# Patient Record
Sex: Female | Born: 1935 | Race: Black or African American | Hispanic: No | Smoking: Never smoker
Health system: Southern US, Community
[De-identification: ages and names within clinical notes are randomized; demographics above are authoritative.]

## PROBLEM LIST (undated history)

## (undated) DIAGNOSIS — F22 Delusional disorders: Secondary | ICD-10-CM

## (undated) DIAGNOSIS — I10 Essential (primary) hypertension: Secondary | ICD-10-CM

## (undated) DIAGNOSIS — K5730 Diverticulosis of large intestine without perforation or abscess without bleeding: Secondary | ICD-10-CM

## (undated) DIAGNOSIS — F29 Unspecified psychosis not due to a substance or known physiological condition: Secondary | ICD-10-CM

## (undated) DIAGNOSIS — K439 Ventral hernia without obstruction or gangrene: Secondary | ICD-10-CM

## (undated) DIAGNOSIS — K219 Gastro-esophageal reflux disease without esophagitis: Secondary | ICD-10-CM

## (undated) DIAGNOSIS — E785 Hyperlipidemia, unspecified: Secondary | ICD-10-CM

## (undated) DIAGNOSIS — F0390 Unspecified dementia without behavioral disturbance: Secondary | ICD-10-CM

## (undated) DIAGNOSIS — K589 Irritable bowel syndrome without diarrhea: Secondary | ICD-10-CM

## (undated) DIAGNOSIS — K449 Diaphragmatic hernia without obstruction or gangrene: Secondary | ICD-10-CM

## (undated) HISTORY — DX: Essential (primary) hypertension: I10

## (undated) HISTORY — DX: Ventral hernia without obstruction or gangrene: K43.9

## (undated) HISTORY — DX: Irritable bowel syndrome, unspecified: K58.9

## (undated) HISTORY — DX: Diverticulosis of large intestine without perforation or abscess without bleeding: K57.30

## (undated) HISTORY — PX: HIATAL HERNIA REPAIR: SHX195

## (undated) HISTORY — DX: Delusional disorders: F22

## (undated) HISTORY — DX: Hyperlipidemia, unspecified: E78.5

## (undated) HISTORY — PX: CHOLECYSTECTOMY: SHX55

## (undated) HISTORY — DX: Unspecified psychosis not due to a substance or known physiological condition: F29

## (undated) HISTORY — DX: Unspecified dementia, unspecified severity, without behavioral disturbance, psychotic disturbance, mood disturbance, and anxiety: F03.90

## (undated) HISTORY — DX: Gastro-esophageal reflux disease without esophagitis: K21.9

## (undated) HISTORY — DX: Diaphragmatic hernia without obstruction or gangrene: K44.9

---

## 1997-10-13 ENCOUNTER — Ambulatory Visit (HOSPITAL_COMMUNITY): Admission: RE | Admit: 1997-10-13 | Discharge: 1997-10-13 | Payer: Self-pay | Admitting: Pulmonary Disease

## 1998-01-17 ENCOUNTER — Ambulatory Visit (HOSPITAL_COMMUNITY): Admission: RE | Admit: 1998-01-17 | Discharge: 1998-01-17 | Payer: Self-pay | Admitting: Pulmonary Disease

## 1998-01-17 ENCOUNTER — Encounter: Payer: Self-pay | Admitting: Pulmonary Disease

## 1998-02-08 ENCOUNTER — Other Ambulatory Visit: Admission: RE | Admit: 1998-02-08 | Discharge: 1998-02-08 | Payer: Self-pay | Admitting: Obstetrics & Gynecology

## 1998-05-25 ENCOUNTER — Inpatient Hospital Stay (HOSPITAL_COMMUNITY): Admission: EM | Admit: 1998-05-25 | Discharge: 1998-05-28 | Payer: Self-pay | Admitting: Emergency Medicine

## 1998-05-25 ENCOUNTER — Encounter: Payer: Self-pay | Admitting: Emergency Medicine

## 1998-11-23 ENCOUNTER — Inpatient Hospital Stay (HOSPITAL_COMMUNITY): Admission: RE | Admit: 1998-11-23 | Discharge: 1998-11-27 | Payer: Self-pay

## 1998-12-07 ENCOUNTER — Ambulatory Visit (HOSPITAL_COMMUNITY): Admission: RE | Admit: 1998-12-07 | Discharge: 1998-12-07 | Payer: Self-pay

## 1998-12-14 ENCOUNTER — Ambulatory Visit (HOSPITAL_COMMUNITY): Admission: RE | Admit: 1998-12-14 | Discharge: 1998-12-14 | Payer: Self-pay

## 1999-04-25 ENCOUNTER — Encounter: Payer: Self-pay | Admitting: Pulmonary Disease

## 1999-04-25 ENCOUNTER — Ambulatory Visit (HOSPITAL_COMMUNITY): Admission: RE | Admit: 1999-04-25 | Discharge: 1999-04-25 | Payer: Self-pay | Admitting: Pulmonary Disease

## 2000-02-13 ENCOUNTER — Other Ambulatory Visit: Admission: RE | Admit: 2000-02-13 | Discharge: 2000-02-13 | Payer: Self-pay | Admitting: Obstetrics and Gynecology

## 2000-04-30 ENCOUNTER — Ambulatory Visit (HOSPITAL_COMMUNITY): Admission: RE | Admit: 2000-04-30 | Discharge: 2000-04-30 | Payer: Self-pay | Admitting: Pulmonary Disease

## 2000-04-30 ENCOUNTER — Encounter: Payer: Self-pay | Admitting: Pulmonary Disease

## 2000-11-12 ENCOUNTER — Other Ambulatory Visit: Admission: RE | Admit: 2000-11-12 | Discharge: 2000-11-12 | Payer: Self-pay | Admitting: Gastroenterology

## 2000-11-12 ENCOUNTER — Encounter (INDEPENDENT_AMBULATORY_CARE_PROVIDER_SITE_OTHER): Payer: Self-pay | Admitting: Specialist

## 2000-11-16 ENCOUNTER — Encounter: Admission: RE | Admit: 2000-11-16 | Discharge: 2000-11-16 | Payer: Self-pay | Admitting: Gastroenterology

## 2000-11-16 ENCOUNTER — Encounter: Payer: Self-pay | Admitting: Gastroenterology

## 2000-11-26 ENCOUNTER — Encounter: Payer: Self-pay | Admitting: Gastroenterology

## 2000-11-26 ENCOUNTER — Encounter: Admission: RE | Admit: 2000-11-26 | Discharge: 2000-11-26 | Payer: Self-pay | Admitting: Gastroenterology

## 2001-02-17 ENCOUNTER — Other Ambulatory Visit: Admission: RE | Admit: 2001-02-17 | Discharge: 2001-02-17 | Payer: Self-pay | Admitting: *Deleted

## 2002-03-09 ENCOUNTER — Ambulatory Visit (HOSPITAL_COMMUNITY): Admission: RE | Admit: 2002-03-09 | Discharge: 2002-03-09 | Payer: Self-pay | Admitting: Pulmonary Disease

## 2002-03-09 ENCOUNTER — Encounter: Payer: Self-pay | Admitting: Cardiovascular Disease

## 2002-07-22 ENCOUNTER — Emergency Department (HOSPITAL_COMMUNITY): Admission: EM | Admit: 2002-07-22 | Discharge: 2002-07-22 | Payer: Self-pay | Admitting: Emergency Medicine

## 2002-07-22 ENCOUNTER — Encounter: Payer: Self-pay | Admitting: Emergency Medicine

## 2003-03-02 ENCOUNTER — Encounter (INDEPENDENT_AMBULATORY_CARE_PROVIDER_SITE_OTHER): Payer: Self-pay | Admitting: Gastroenterology

## 2003-07-31 ENCOUNTER — Other Ambulatory Visit: Admission: RE | Admit: 2003-07-31 | Discharge: 2003-07-31 | Payer: Self-pay | Admitting: *Deleted

## 2003-12-07 ENCOUNTER — Emergency Department (HOSPITAL_COMMUNITY): Admission: EM | Admit: 2003-12-07 | Discharge: 2003-12-07 | Payer: Self-pay | Admitting: Emergency Medicine

## 2004-03-06 ENCOUNTER — Ambulatory Visit (HOSPITAL_COMMUNITY): Admission: RE | Admit: 2004-03-06 | Discharge: 2004-03-06 | Payer: Self-pay | Admitting: Pulmonary Disease

## 2004-04-03 ENCOUNTER — Ambulatory Visit: Payer: Self-pay | Admitting: Gastroenterology

## 2004-05-03 ENCOUNTER — Ambulatory Visit: Payer: Self-pay | Admitting: Gastroenterology

## 2004-06-27 ENCOUNTER — Emergency Department (HOSPITAL_COMMUNITY): Admission: EM | Admit: 2004-06-27 | Discharge: 2004-06-27 | Payer: Self-pay | Admitting: Emergency Medicine

## 2004-07-03 ENCOUNTER — Ambulatory Visit: Payer: Self-pay | Admitting: Gastroenterology

## 2004-07-05 ENCOUNTER — Encounter: Admission: RE | Admit: 2004-07-05 | Discharge: 2004-07-05 | Payer: Self-pay | Admitting: Gastroenterology

## 2004-11-04 ENCOUNTER — Emergency Department (HOSPITAL_COMMUNITY): Admission: EM | Admit: 2004-11-04 | Discharge: 2004-11-04 | Payer: Self-pay | Admitting: Emergency Medicine

## 2004-11-27 ENCOUNTER — Ambulatory Visit: Payer: Self-pay | Admitting: Gastroenterology

## 2004-12-09 ENCOUNTER — Ambulatory Visit: Payer: Self-pay | Admitting: Gastroenterology

## 2005-02-24 ENCOUNTER — Emergency Department (HOSPITAL_COMMUNITY): Admission: EM | Admit: 2005-02-24 | Discharge: 2005-02-24 | Payer: Self-pay | Admitting: Emergency Medicine

## 2005-09-30 ENCOUNTER — Ambulatory Visit (HOSPITAL_COMMUNITY): Admission: RE | Admit: 2005-09-30 | Discharge: 2005-09-30 | Payer: Self-pay | Admitting: Pulmonary Disease

## 2005-10-15 ENCOUNTER — Ambulatory Visit: Payer: Self-pay | Admitting: Gastroenterology

## 2005-11-02 ENCOUNTER — Emergency Department (HOSPITAL_COMMUNITY): Admission: EM | Admit: 2005-11-02 | Discharge: 2005-11-03 | Payer: Self-pay | Admitting: Emergency Medicine

## 2005-11-10 ENCOUNTER — Ambulatory Visit: Payer: Self-pay | Admitting: Gastroenterology

## 2005-12-01 ENCOUNTER — Ambulatory Visit: Payer: Self-pay | Admitting: Cardiology

## 2005-12-15 ENCOUNTER — Emergency Department (HOSPITAL_COMMUNITY): Admission: EM | Admit: 2005-12-15 | Discharge: 2005-12-15 | Payer: Self-pay | Admitting: Family Medicine

## 2005-12-18 ENCOUNTER — Ambulatory Visit: Payer: Self-pay | Admitting: Cardiology

## 2005-12-18 ENCOUNTER — Ambulatory Visit: Payer: Self-pay

## 2006-03-23 ENCOUNTER — Ambulatory Visit: Payer: Self-pay | Admitting: Gastroenterology

## 2006-06-09 ENCOUNTER — Emergency Department (HOSPITAL_COMMUNITY): Admission: EM | Admit: 2006-06-09 | Discharge: 2006-06-09 | Payer: Self-pay | Admitting: Emergency Medicine

## 2006-07-06 ENCOUNTER — Emergency Department (HOSPITAL_COMMUNITY): Admission: EM | Admit: 2006-07-06 | Discharge: 2006-07-06 | Payer: Self-pay | Admitting: Family Medicine

## 2006-07-22 ENCOUNTER — Ambulatory Visit: Payer: Self-pay | Admitting: Gastroenterology

## 2006-08-08 ENCOUNTER — Emergency Department (HOSPITAL_COMMUNITY): Admission: EM | Admit: 2006-08-08 | Discharge: 2006-08-08 | Payer: Self-pay | Admitting: Emergency Medicine

## 2006-12-12 ENCOUNTER — Emergency Department (HOSPITAL_COMMUNITY): Admission: EM | Admit: 2006-12-12 | Discharge: 2006-12-12 | Payer: Self-pay | Admitting: Emergency Medicine

## 2006-12-15 ENCOUNTER — Ambulatory Visit: Payer: Self-pay | Admitting: Gastroenterology

## 2006-12-30 ENCOUNTER — Ambulatory Visit: Payer: Self-pay | Admitting: Gastroenterology

## 2007-01-05 ENCOUNTER — Encounter: Admission: RE | Admit: 2007-01-05 | Discharge: 2007-01-05 | Payer: Self-pay | Admitting: Gastroenterology

## 2007-02-23 ENCOUNTER — Ambulatory Visit: Payer: Self-pay | Admitting: Gastroenterology

## 2007-02-26 ENCOUNTER — Ambulatory Visit: Payer: Self-pay | Admitting: Gastroenterology

## 2007-04-19 ENCOUNTER — Ambulatory Visit: Payer: Self-pay | Admitting: Gastroenterology

## 2007-04-29 ENCOUNTER — Emergency Department (HOSPITAL_COMMUNITY): Admission: EM | Admit: 2007-04-29 | Discharge: 2007-04-29 | Payer: Self-pay | Admitting: Emergency Medicine

## 2007-05-14 ENCOUNTER — Ambulatory Visit: Payer: Self-pay | Admitting: Gastroenterology

## 2007-08-04 DIAGNOSIS — E785 Hyperlipidemia, unspecified: Secondary | ICD-10-CM

## 2007-08-04 DIAGNOSIS — K449 Diaphragmatic hernia without obstruction or gangrene: Secondary | ICD-10-CM | POA: Insufficient documentation

## 2007-08-04 DIAGNOSIS — K219 Gastro-esophageal reflux disease without esophagitis: Secondary | ICD-10-CM | POA: Insufficient documentation

## 2007-08-04 DIAGNOSIS — I10 Essential (primary) hypertension: Secondary | ICD-10-CM

## 2007-08-04 DIAGNOSIS — K439 Ventral hernia without obstruction or gangrene: Secondary | ICD-10-CM | POA: Insufficient documentation

## 2007-08-04 DIAGNOSIS — K589 Irritable bowel syndrome without diarrhea: Secondary | ICD-10-CM

## 2007-08-04 DIAGNOSIS — K5730 Diverticulosis of large intestine without perforation or abscess without bleeding: Secondary | ICD-10-CM | POA: Insufficient documentation

## 2007-08-08 ENCOUNTER — Emergency Department (HOSPITAL_COMMUNITY): Admission: EM | Admit: 2007-08-08 | Discharge: 2007-08-08 | Payer: Self-pay | Admitting: Emergency Medicine

## 2007-11-09 ENCOUNTER — Encounter: Payer: Self-pay | Admitting: Gastroenterology

## 2007-11-10 ENCOUNTER — Ambulatory Visit: Payer: Self-pay | Admitting: Gastroenterology

## 2007-11-10 DIAGNOSIS — Z860100 Personal history of colon polyps, unspecified: Secondary | ICD-10-CM | POA: Insufficient documentation

## 2007-11-10 DIAGNOSIS — K5900 Constipation, unspecified: Secondary | ICD-10-CM

## 2007-11-23 ENCOUNTER — Telehealth: Payer: Self-pay | Admitting: Gastroenterology

## 2007-12-10 ENCOUNTER — Emergency Department (HOSPITAL_COMMUNITY): Admission: EM | Admit: 2007-12-10 | Discharge: 2007-12-10 | Payer: Self-pay | Admitting: Emergency Medicine

## 2007-12-27 ENCOUNTER — Telehealth: Payer: Self-pay | Admitting: Gastroenterology

## 2008-01-05 ENCOUNTER — Ambulatory Visit: Payer: Self-pay | Admitting: Gastroenterology

## 2008-01-11 ENCOUNTER — Ambulatory Visit: Payer: Self-pay | Admitting: Gastroenterology

## 2008-05-10 ENCOUNTER — Telehealth: Payer: Self-pay | Admitting: Gastroenterology

## 2008-05-27 ENCOUNTER — Emergency Department (HOSPITAL_COMMUNITY): Admission: EM | Admit: 2008-05-27 | Discharge: 2008-05-27 | Payer: Self-pay | Admitting: Emergency Medicine

## 2008-05-29 ENCOUNTER — Telehealth: Payer: Self-pay | Admitting: Gastroenterology

## 2008-05-31 ENCOUNTER — Ambulatory Visit: Payer: Self-pay | Admitting: Gastroenterology

## 2008-05-31 DIAGNOSIS — R1013 Epigastric pain: Secondary | ICD-10-CM | POA: Insufficient documentation

## 2008-05-31 LAB — CONVERTED CEMR LAB
BUN: 20 mg/dL (ref 6–23)
Creatinine, Ser: 1 mg/dL (ref 0.4–1.2)

## 2008-06-01 ENCOUNTER — Telehealth: Payer: Self-pay | Admitting: Gastroenterology

## 2008-06-05 ENCOUNTER — Ambulatory Visit: Payer: Self-pay | Admitting: Cardiology

## 2008-06-07 ENCOUNTER — Telehealth: Payer: Self-pay | Admitting: Gastroenterology

## 2008-06-12 ENCOUNTER — Telehealth: Payer: Self-pay | Admitting: Gastroenterology

## 2008-06-14 ENCOUNTER — Telehealth: Payer: Self-pay | Admitting: Gastroenterology

## 2008-06-15 ENCOUNTER — Telehealth: Payer: Self-pay | Admitting: Gastroenterology

## 2008-06-21 ENCOUNTER — Emergency Department (HOSPITAL_COMMUNITY): Admission: EM | Admit: 2008-06-21 | Discharge: 2008-06-21 | Payer: Self-pay | Admitting: Emergency Medicine

## 2008-10-24 ENCOUNTER — Emergency Department (HOSPITAL_COMMUNITY): Admission: EM | Admit: 2008-10-24 | Discharge: 2008-10-24 | Payer: Self-pay | Admitting: Family Medicine

## 2008-10-25 ENCOUNTER — Telehealth: Payer: Self-pay | Admitting: Gastroenterology

## 2008-11-08 ENCOUNTER — Ambulatory Visit: Admission: RE | Admit: 2008-11-08 | Discharge: 2008-11-08 | Payer: Self-pay | Admitting: Obstetrics and Gynecology

## 2008-11-08 ENCOUNTER — Ambulatory Visit: Payer: Self-pay | Admitting: Vascular Surgery

## 2008-11-08 ENCOUNTER — Encounter (INDEPENDENT_AMBULATORY_CARE_PROVIDER_SITE_OTHER): Payer: Self-pay | Admitting: Obstetrics and Gynecology

## 2008-12-06 ENCOUNTER — Telehealth: Payer: Self-pay | Admitting: Gastroenterology

## 2009-04-10 ENCOUNTER — Telehealth: Payer: Self-pay | Admitting: Gastroenterology

## 2009-04-11 ENCOUNTER — Emergency Department (HOSPITAL_COMMUNITY): Admission: EM | Admit: 2009-04-11 | Discharge: 2009-04-11 | Payer: Self-pay | Admitting: Emergency Medicine

## 2009-05-11 ENCOUNTER — Ambulatory Visit: Payer: Self-pay | Admitting: Gastroenterology

## 2009-05-13 ENCOUNTER — Telehealth: Payer: Self-pay | Admitting: Internal Medicine

## 2009-05-14 ENCOUNTER — Telehealth: Payer: Self-pay | Admitting: Gastroenterology

## 2009-05-16 ENCOUNTER — Telehealth: Payer: Self-pay | Admitting: Gastroenterology

## 2009-05-17 ENCOUNTER — Telehealth: Payer: Self-pay | Admitting: Gastroenterology

## 2009-05-22 ENCOUNTER — Telehealth: Payer: Self-pay | Admitting: Gastroenterology

## 2009-05-28 ENCOUNTER — Emergency Department (HOSPITAL_COMMUNITY): Admission: EM | Admit: 2009-05-28 | Discharge: 2009-05-28 | Payer: Self-pay | Admitting: Emergency Medicine

## 2009-05-28 ENCOUNTER — Telehealth: Payer: Self-pay | Admitting: Gastroenterology

## 2009-05-30 ENCOUNTER — Telehealth (INDEPENDENT_AMBULATORY_CARE_PROVIDER_SITE_OTHER): Payer: Self-pay | Admitting: *Deleted

## 2009-06-01 ENCOUNTER — Ambulatory Visit: Payer: Self-pay | Admitting: Gastroenterology

## 2009-06-03 ENCOUNTER — Telehealth: Payer: Self-pay | Admitting: Gastroenterology

## 2009-06-04 ENCOUNTER — Ambulatory Visit: Payer: Self-pay | Admitting: Gastroenterology

## 2009-06-04 ENCOUNTER — Encounter (INDEPENDENT_AMBULATORY_CARE_PROVIDER_SITE_OTHER): Payer: Self-pay | Admitting: *Deleted

## 2009-06-05 ENCOUNTER — Telehealth (INDEPENDENT_AMBULATORY_CARE_PROVIDER_SITE_OTHER): Payer: Self-pay | Admitting: *Deleted

## 2009-06-06 ENCOUNTER — Encounter: Payer: Self-pay | Admitting: Gastroenterology

## 2009-06-11 ENCOUNTER — Telehealth: Payer: Self-pay | Admitting: Gastroenterology

## 2009-06-13 ENCOUNTER — Telehealth: Payer: Self-pay | Admitting: Gastroenterology

## 2009-07-12 ENCOUNTER — Telehealth: Payer: Self-pay | Admitting: Gastroenterology

## 2009-07-16 ENCOUNTER — Ambulatory Visit: Payer: Self-pay | Admitting: Gastroenterology

## 2009-07-18 LAB — CONVERTED CEMR LAB
Basophils Absolute: 0.1 10*3/uL (ref 0.0–0.1)
Basophils Relative: 1.5 % (ref 0.0–3.0)
Eosinophils Relative: 3.5 % (ref 0.0–5.0)
HCT: 35.7 % — ABNORMAL LOW (ref 36.0–46.0)
Hemoglobin: 12 g/dL (ref 12.0–15.0)
Lymphocytes Relative: 34.9 % (ref 12.0–46.0)
Platelets: 224 10*3/uL (ref 150.0–400.0)
RBC: 4 M/uL (ref 3.87–5.11)
RDW: 13.5 % (ref 11.5–14.6)

## 2009-07-24 ENCOUNTER — Emergency Department (HOSPITAL_COMMUNITY): Admission: EM | Admit: 2009-07-24 | Discharge: 2009-07-25 | Payer: Self-pay | Admitting: Emergency Medicine

## 2010-01-14 ENCOUNTER — Telehealth: Payer: Self-pay | Admitting: Gastroenterology

## 2010-01-15 ENCOUNTER — Telehealth: Payer: Self-pay | Admitting: Gastroenterology

## 2010-01-23 ENCOUNTER — Telehealth: Payer: Self-pay | Admitting: Gastroenterology

## 2010-02-05 ENCOUNTER — Encounter (INDEPENDENT_AMBULATORY_CARE_PROVIDER_SITE_OTHER): Payer: Self-pay | Admitting: Internal Medicine

## 2010-02-05 ENCOUNTER — Inpatient Hospital Stay (HOSPITAL_COMMUNITY): Admission: EM | Admit: 2010-02-05 | Discharge: 2010-02-07 | Payer: Self-pay | Admitting: Emergency Medicine

## 2010-05-25 ENCOUNTER — Encounter: Payer: Self-pay | Admitting: Obstetrics and Gynecology

## 2010-05-27 ENCOUNTER — Encounter: Payer: Self-pay | Admitting: Gastroenterology

## 2010-06-04 NOTE — Progress Notes (Signed)
Summary: Triage   Phone Note Call from Patient Call back at Home Phone [phone]   Caller: Patient Call For: Dr. Christella Hartigan Reason for Call: Talk to Nurse Summary of Call: Pt. has no energy and wants to know if she can take Ensure/Boost Initial call taken by: Karna Christmas,  June 11, 2009 1:20 PM  Follow-up for Phone Call        pt was instructed to call her PCP about her fatigue.   Follow-up by: Chales Abrahams CMA (AAMA),  June 11, 2009 1:24 PM

## 2010-06-04 NOTE — Progress Notes (Signed)
Summary: discussed pt with Dr. Petra Kuba   Phone Note From Other Clinic   Caller: DR. Petra Kuba Summary of Call: Ann Riddle has been calling him several times a week as well with abdominal pains (well described, well testes, probably functional).  we agreed that she would stop PPI (perhaps are causing dyspepsia) and she will change to H2 blocker twice daily for at least a week.  if no improvement, then trial of low dose reglan 5mg  in AM and at bedtime. Initial call taken by: Rachael Fee MD,  May 17, 2009 11:55 AM     Appended Document: discussed pt with Dr. Petra Kuba pt called and wanted to know what Dr Christella Hartigan and Dr Petra Kuba decided on about her abd pain.  I gave her the recommendations described in the phone note above.  She is going to call in about a week to give [place] a condition update.

## 2010-06-04 NOTE — Assessment & Plan Note (Signed)
Review of gastrointestinal problems: 1. Chronic abdominal pain. Diagnosed as adhesive disease in the past by Dr. Victorino Dike. CT scan September 2008 showed ventral hiatal hernia was stable.  No acute findings in the abdomen or pelvis.  January, 2010: repeat pains, went to emergency room, complete metabolic profile, amylase, lipase were normal. CT Scan January, 2010 with IV and oral contrast of abdomen and pelvis was normal.  CT Scan December, 2010 done by ER, essentially normal. 2. Chronic gastroesophageal reflux disease. Esophagogastroduodenoscopy July 2002 showed no Barrett's esophagus. EGD October 2008 was normal, except for a 2 cm hiatal hernia. 3. History of colon polyps, last colonoscopy October 2004 by Dr. Victorino Dike showed no colon polyps. He wrote the indication for that examination was adenomatous polyps. I do not see any pathology reports in our chart indicating adenomatous polyps. Repeat colonoscopy 01/2008 by [facility] was normal to terminal ileum except for small hemorrhoids. 4. Status post remote cholecystectomy     History of Present Illness Visit Type: Follow-up Visit Primary GI MD: Rob Bunting MD Primary Provider: Myrtie Neither Requesting Provider: Ainsley Spinner Chief Complaint: Abdomial pain & nausea History of Present Illness:     77 year old woman who has chronic abdominal pains, CD extensive workup for the past several years above. She has canceled or no showed her last 4 appointments in the past year with me.    she has had 6 months of abd pains, "throbbing" like pains,  usually worst when she is laying down.  Usually after eating a meal makes it worse.  Will wake up with the discomforts in middle of the night.  +Nuasea, but no vomitting.    She eats dinner at 5, lays down for bed at 11:30.  Will eat snack about 1/2 the time.    Takes nexium 8am, eats BF at 8:30.  Also takes nexium before dinner.  Sometimes will miss the nexium at night.  she was in the hospital in  December for the same pains. I see that he CT scan of her abdomen and pelvis was repeated and this showed no obvious cause for her pains. She was recently her primary care's office who recommended and give her a prescription for metoclopramide 10 mg to take several times a day.  Does not drink alcohol, smoke cigs.  Rarely caffein in PM>           Current Medications (verified): 1)  Benicar Hct 40-25 Mg  Tabs (Olmesartan Medoxomil-Hctz) .Marland Kitchen.. 12.5 M-F 40 Mg Daily 2)  Welchol 625 Mg  Tabs (Colesevelam Hcl) .Marland Kitchen.. 1 At Bedtime 3)  Klor-Con 20 Meq  Pack (Potassium Chloride) .... Take 1 Tablet Twice Weekly 4)  Adult Aspirin Low Strength 81 Mg  Tbdp (Aspirin) .Marland Kitchen.. 1 Once Daily 5)  Centrum Silver   Tabs (Multiple Vitamins-Minerals) .Marland Kitchen.. 1 Once Daily 6)  Nexium 40 Mg  Cpdr (Esomeprazole Magnesium) .... One Tablet Twice Daily 7)  Alprazolam 0.25 Mg  Tbdp (Alprazolam) .... As Needed  Allergies (verified): 1)  * Robinol Forte 2)  * Align  Vital Signs:  Patient profile:   77 year old female Height:      60.5 inches Weight:      174.13 pounds BMI:     33.57 Pulse rate:   76 / minute Pulse rhythm:   regular BP sitting:   140 / 66  (left arm) Cuff size:   regular  Vitals Entered By: June McMurray CMA [facility]) (May 11, 2009 9:51 AM)  Physical Exam  Additional Exam:  Constitutional: generally well appearing Psychiatric: alert and oriented times 3 Abdomen: soft, non-tender, non-distended, normal bowel sounds    Impression & Recommendations:  Problem # 1:  chronic abdominal pains she has had chronic abdominal pains for many years. Extensive workup, summarized above including upper endoscopy, colonoscopy, multiple CT scans ( I count 6 CT scans of her abdomen and pelvis done in the past 8-9 years).  I do not think further workup needs to be done in the symptoms. She has either functional dyspepsia or perhaps GERD related dyspepsia. Today she describes discomforts that are usually in the  middle the night, perhaps pointing more towards acid reflux as a etiology. She should continue her Nexium twice daily and I have added Pepcid which she will take at bedtime every night. She was recently given a prescription for metoclopramide which I told her I do not think she should begin.  Patient Instructions: 1)  Continue taking nexium twice daily, 20-30 prior to breafkast and dinner meals. 2)  Add pepcid, take one pill at bedtime every night. 3)  Return to see Dr. Christella Hartigan in 2 months. 4)  Do not start metaclopramide. 5)  The medication list was reviewed and reconciled.  All changed / newly prescribed medications were explained.  A complete medication list was provided to the patient / caregiver.

## 2010-06-04 NOTE — Progress Notes (Signed)
Summary: meds not helping   Phone Note Call from Patient Call back at Home Phone [phone]   Caller: Patient Call For: Dr. Christella Hartigan Reason for Call: Talk to Nurse Summary of Call: pt reports Pepcid inst helping and would like to know if she needs to take something else... pt says she is "really sick" and will have to go to the hospital if someone cant help her Initial call taken by: Vallarie Mare,  May 28, 2009 8:42 AM  Follow-up for Phone Call        I called the pt she is insisting she needs to go to  the ER.  I offered her an appt this afternoon with Dr Christella Hartigan but she refused.  She does have an appt this Friday with Dr Christella Hartigan and will keep that. Follow-up by: Chales Abrahams CMA [facility]),  May 28, 2009 8:56 AM  Additional Follow-up for Phone Call Additional follow up Details #1::        ok Additional Follow-up by: Rachael Fee MD,  May 28, 2009 9:00 AM

## 2010-06-04 NOTE — Progress Notes (Signed)
Summary: Procedure Results   Phone Note Call from Patient Call back at Home Phone [phone]   Caller: Patient Call For: Dr. Christella Hartigan Reason for Call: Talk to Nurse Summary of Call: Pt. is calling about EGD results from yesterday Initial call taken by: Karna Christmas,  June 05, 2009 2:43 PM  Follow-up for Phone Call        RETURNED PHONE CALL AND ANSWERED QUESTION CONCERNING PROCEDURE YESTERDAY. REMINDED PATIENT THAT WE HAVE TO WAIT FOR THE RESULTS TO COME BACK. Follow-up by: Weston Brass,  June 05, 2009 4:21 PM

## 2010-06-04 NOTE — Progress Notes (Signed)
Summary: Abd pain at night   Phone Note Call from Patient Call back at Home Phone [phone]   Call For: DR JACOBS Reason for Call: Talk to Nurse Summary of Call: Abd pain apin at nights- what can she take to soothe it down? Initial call taken by: Leanor Kail [facility],  May 16, 2009 4:42 PM  Follow-up for Phone Call        pt wants to try pepto bismol for her abd pains at night.  she feels like she needs a coating on her stomach.   she had tried the GI cocktail at the ER that helps.  Is there anything better she can take that is a liquid. Follow-up by: Chales Abrahams CMA [facility]),  May 16, 2009 4:51 PM  Additional Follow-up for Phone Call Additional follow up Details #1::        pepto seems reasonable.  she should call us in 2-3 weeks to let us know if it helps Additional Follow-up by: Rachael Fee MD,  May 17, 2009 7:23 AM    Additional Follow-up for Phone Call Additional follow up Details #2::    pt aware and will call in 2-3 weeks Follow-up by: Chales Abrahams CMA [facility]),  May 17, 2009 8:01 AM

## 2010-06-04 NOTE — Letter (Signed)
Summary: Results Letter  [facility]  [address] [place], [place] [address]   Phone: [phone]  Fax: [phone]        June 06, 2009 MRN: [id number]    [facility] [address] [place], [place]  [address]    Dear Ms. Riesen,   The biopsies taken during your recent upper endoscopy showed no sign of infection or cancer.  You should continue to follow the recommnedations that we discussed at the time of your procedure.  Please feel free to call if you have any further questions or concerns.       Sincerely,  Rachael Fee MD  This letter has been electronically signed by your physician.  Appended Document: Results Letter Letter mailed 2.3.11

## 2010-06-04 NOTE — Progress Notes (Signed)
   Phone Note Call from Patient   Caller: Patient Summary of Call: On call note @ 1800. Pt c/o nausea and mild contipation, both chronic and unchanged. Saw Dr. Christella Hartigan in the office on 06/01/09. Wants to know if she can use Maalox and Miralax. Reviewing last several phone notes and office notes, these have already been recommended. Advised to use both as previously directed. Concerning for memory deficits. Initial call taken by: Meryl Dare MD FACG,  June 03, 2009 6:27 PM

## 2010-06-04 NOTE — Progress Notes (Signed)
Summary: nausea   Phone Note Call from Patient Call back at Home Phone [phone]   Call For: DR Anaelle Dunton Reason for Call: Talk to Nurse Summary of Call: Nausea stays on her stomach all the time. Initial call taken by: Leanor Kail [facility],  May 22, 2009 12:16 PM  Follow-up for Phone Call        pt is calling to say that she is nauseaous all the time.  She feels the Pepcid is making her constipated.  She is going to try Miralax 1 capful a day.  She will call if she has no results.  Can she have something for the nausea? Follow-up by: Chales Abrahams CMA [facility]),  May 22, 2009 1:19 PM  Additional Follow-up for Phone Call Additional follow up Details #1::        needs rov next week Additional Follow-up by: Rachael Fee MD,  May 23, 2009 9:14 PM    Additional Follow-up for Phone Call Additional follow up Details #2::    spoke with the pt she was asleep and will call later.   06/01/09 1015 am  appt scheduled Follow-up by: Chales Abrahams CMA [facility]),  May 24, 2009 8:37 AM  Additional Follow-up for Phone Call Additional follow up Details #3:: Details for Additional Follow-up Action Taken: pt aware and will keep appt Additional Follow-up by: Chales Abrahams CMA [facility]),  May 24, 2009 11:12 AM

## 2010-06-04 NOTE — Procedures (Signed)
Summary: Upper Endoscopy  Patient: Ann Riddle Note: All result statuses are Final unless otherwise noted.  Tests: (1) Upper Endoscopy (EGD)   EGD Upper Endoscopy       DONE     [facility]     [address]     [place], [place]  [address]           ENDOSCOPY PROCEDURE REPORT           PATIENT:  Ann Riddle, Ann Riddle  MR#:  [id number]     BIRTHDATE:  11/04/1924, 84 yrs. old  GENDER:  female           ENDOSCOPIST:  Rachael Fee, MD           PROCEDURE DATE:  06/04/2009     PROCEDURE:  EGD with biopsy     ASA CLASS:  Class II     INDICATIONS:  chronic nausea, abd pains           MEDICATIONS:   Fentanyl 25 mcg IV, Versed 5 mg IV     TOPICAL ANESTHETIC:  none           DESCRIPTION OF PROCEDURE:   After the risks benefits and     alternatives of the procedure were thoroughly explained, informed     consent was obtained.  The LB GIF-H180 [id number] endoscope was     introduced through the mouth and advanced to the second portion of     the duodenum, without limitations.  The instrument was slowly     withdrawn as the mucosa was fully examined.     <<PROCEDUREIMAGES>>           There was mild, non-specific gastritis (small erosions) in distal     stomach. Biopsies were taken to check for H. pylori (see image1).     Duodenitis was found. This was mild, non-specific (see image2).     Otherwise the examination was normal (see image4 and image3).     Retroflexed views revealed no abnormalities.    The scope was then     withdrawn from the patient and the procedure completed.           COMPLICATIONS:  None           ENDOSCOPIC IMPRESSION:     1) Mild gastritis, non-specific.  Biopsied to check for H.     pylori.     2) Duodenitis, mild     3) Otherwise normal examination           RECOMMENDATIONS:     If biopsies show H. pylori, will start on appropriate     antibiotics.  If not, then she should simply continue current     medicines.     Memory difficulty may be playing a  role in difficulty with     management.           ______________________________     Rachael Fee, MD           cc: Dr. Petra Kuba.           n.     eSIGNED:   Rachael Fee at 06/04/2009 09:05 AM           Ann Riddle, [id number]  Note: An exclamation mark (!) indicates a result that was not dispersed into the flowsheet. Document Creation Date: 06/04/2009 9:06 AM _______________________________________________________________________  (1) Order result status: Final Collection or observation date-time: 06/04/2009 08:44 Requested date-time:  Receipt date-time:  Reported date-time:  Referring Physician:   Ordering Physician: Rob Bunting [phone]) Specimen Source:  Source: Launa Grill Order Number: [id number] Lab site:

## 2010-06-04 NOTE — Progress Notes (Signed)
Summary: Triage   Phone Note Call from Patient Call back at Home Phone [phone]   Caller: Patient Call For: Dr. Christella Hartigan Reason for Call: Talk to Nurse Summary of Call: Pt is having abdominal pain and her stool is dark. "She has ate some strawberries that could be causing it." Initial call taken by: Karna Christmas,  July 12, 2009 12:36 PM  Follow-up for Phone Call         Pt. said she ate strawberries yesterday and got her same abd. pain then she ate some more this am and pain returned. Advised not to eat  any more and use her Mylanta as needed as ordered. Says her stools have been black for over 1 month but denies using iron or Pepto -bismol. Follow-up by: Teryl Lucy RN,  July 12, 2009 1:42 PM  Additional Follow-up for Phone Call Additional follow up Details #1::        should get a cbc friday or monday Additional Follow-up by: Rachael Fee MD,  July 12, 2009 9:12 PM    Additional Follow-up for Phone Call Additional follow up Details #2::      Pt. contacted and she will come for blood work today or Monday. Follow-up by: Teryl Lucy RN,  July 13, 2009 8:58 AM

## 2010-06-04 NOTE — Progress Notes (Signed)
Summary: update and ? about meds   Phone Note Call from Patient Call back at Home Phone [phone]   Caller: Patient Summary of Call: patient called over the weekend and Dr. Leone Payor advised her to take Mylanta but she states that they have pulled it from the shelf so she got maalox, but she has not taken any of it. She states that she feels out of it. That her legs feel numb and she does not have any energy. She states that her stomach is not hurting like it was but it is still bothering her some.  Initial call taken by: Harlow Mares CMA [facility]),  May 14, 2009 2:24 PM  Follow-up for Phone Call        pt advised to follow Dr Teresita Madura advice and call her PCP or go to the ER for the numbness in her legs.  pt agreed. Follow-up by: Chales Abrahams CMA [facility]),  May 14, 2009 2:32 PM  Additional Follow-up for Phone Call Additional follow up Details #1::        ok Additional Follow-up by: Rachael Fee MD,  May 15, 2009 9:17 AM

## 2010-06-04 NOTE — Progress Notes (Signed)
Summary: Appt   Phone Note Call from Patient   Summary of Call: pt calling to cx her appt on Friday because she does not have her copay.  I spoke with April at the front desk and she said the pt can use her one time billing option and be billed on Friday.  Pt agreed and will keep appt on Friday.   Initial call taken by: Chales Abrahams CMA [facility]),  May 30, 2009 10:52 AM

## 2010-06-04 NOTE — Letter (Signed)
Summary: EGD Instructions  [name]  [address] [place], [place] [address]   Phone: [phone]  Fax: [phone]       Ann Riddle    11/04/1924    MRN: [id number]       Procedure Day /Date:06/04/09     Arrival Time: 730 am     Procedure Time:830 am     Location of Procedure:                    [facility] (4th Floor)    PREPARATION FOR ENDOSCOPY   On1/31/11 THE DAY OF THE PROCEDURE:  1.   No solid foods, milk or milk products are allowed after midnight the night before your procedure.  2.   Do not drink anything colored red or purple.  Avoid juices with pulp.  No orange juice.  3.  You may drink clear liquids until630 am, which is 2 hours before your procedure.                                                                                                CLEAR LIQUIDS INCLUDE: Water Jello Ice Popsicles Tea (sugar ok, no milk/cream) Powdered fruit flavored drinks Coffee (sugar ok, no milk/cream) Gatorade Juice: apple, white grape, white cranberry  Lemonade Clear bullion, consomm, broth Carbonated beverages (any kind) Strained chicken noodle soup Hard Candy   MEDICATION INSTRUCTIONS  Unless otherwise instructed, you should take regular prescription medications with a small sip of water as early as possible the morning of your procedure.             OTHER INSTRUCTIONS  You will need a responsible adult at least 75 years of age to accompany you and drive you home.   This person must remain in the waiting room during your procedure.  Wear loose fitting clothing that is easily removed.  Leave jewelry and other valuables at home.  However, you may wish to bring a book to read or an iPod/MP3 player to listen to music as you wait for your procedure to start.  Remove all body piercing jewelry and leave at home.  Total time from sign-in until discharge is approximately 2-3 hours.  You should go home directly after your  procedure and rest.  You can resume normal activities the day after your procedure.  The day of your procedure you should not:   Drive   Make legal decisions   Operate machinery   Drink alcohol   Return to work  You will receive specific instructions about eating, activities and medications before you leave.    The above instructions have been reviewed and explained to me by   _______________________    I fully understand and can verbalize these instructions _____________________________ Date _________

## 2010-06-04 NOTE — Progress Notes (Signed)
Summary: triage   Phone Note Call from Patient Call back at Home Phone [phone]   Caller: Patient Call For: Christella Hartigan Reason for Call: Talk to Nurse Summary of Call: Patient had severe stomach pain all night wants to know what to do states that she took her meds before bed as she was told Initial call taken by: Tawni Levy,  June 13, 2009 3:02 PM  Follow-up for Phone Call        pt says the pain is gone now but she usually gets the pain after she has went to bed.  Not sure if what she eats is causing the pain.  She is going to keep a food journal and see if anything she is eating is causing her symptoms.  She will call in a week with an update. Follow-up by: Chales Abrahams CMA [facility]),  June 13, 2009 3:30 PM  Additional Follow-up for Phone Call Additional follow up Details #1::        ok Additional Follow-up by: Rachael Fee MD,  June 14, 2009 9:38 AM

## 2010-06-04 NOTE — Progress Notes (Signed)
Summary: perpcid not helping  Medications Added MYLANTA 200-200-20 MG/5ML SUSP (ALUM & MAG HYDROXIDE-SIMETH) 2 tablespoons at bedtime and prn       Phone Note Call from Patient   Caller: Patient Summary of Call: The pepcid she is taking at night as recommended is not helping her abdominal pain and reflux symptoms. she says problems are same as described in 05/11/09 note. would like something to "coat her stomach". I have recommended Mylanta 30 cc at bedtime and q 6 hrs as needed. I also explained that pains in her arms and legs are highly unlikely to be linked to GI problems. I reiterated that she should not use metaclopramide. She is to f/u as needed and discuss extremity issues with pcp  Initial call taken by: Iva Boop MD, Clementeen Graham,  May 13, 2009 10:11 AM    New/Updated Medications: MYLANTA 200-200-20 MG/5ML SUSP (ALUM & MAG HYDROXIDE-SIMETH) 2 tablespoons at bedtime and prn

## 2010-06-04 NOTE — Assessment & Plan Note (Signed)
Review of gastrointestinal problems: 1. Chronic abdominal pain. Diagnosed as adhesive disease in the past by Dr. Victorino Dike. CT scan September 2008 showed ventral hiatal hernia was stable.  No acute findings in the abdomen or pelvis.  January, 2010: repeat pains, went to emergency room, complete metabolic profile, amylase, lipase were normal. CT Scan January, 2010 with IV and oral contrast of abdomen and pelvis was normal.  CT Scan December, 2010 done by ER, essentially normal. 2. Chronic gastroesophageal reflux disease. Esophagogastroduodenoscopy July 2002 showed no Barrett's esophagus. EGD October 2008 was normal, except for a 2 cm hiatal hernia. 3. History of colon polyps, last colonoscopy October 2004 by Dr. Victorino Dike showed no colon polyps. He wrote the indication for that examination was adenomatous polyps. I do not see any pathology reports in our chart indicating adenomatous polyps. Repeat colonoscopy 01/2008 by [facility] was normal to terminal ileum except for small hemorrhoids. 4. Status post remote cholecystectomy 5. Nausea:  Winter, 2010    History of Present Illness Visit Type: follow up  Primary GI MD: Rob Bunting MD Primary Provider: Corine Shelter, MD  Requesting Provider: n/a Chief Complaint: Generalized abd pain and nausea  History of Present Illness:     77 year old woman who is here with her daughter today, she is very bothered by nausea. this occurs every day. Eating will usually help the nausea.  She has chronic abd pains, same.  She takes no OTC pain meds (no NSAIDs).  she is only taking pepcid at bedtime for GI meds.  This helps with her abd discomforts.  She believes the pepcid is making her constipated.  she tried doubling her MiraLax on the advice of a church friend, this helped.           Current Medications (verified): 1)  Benicar Hct 40-25 Mg  Tabs (Olmesartan Medoxomil-Hctz) .Marland Kitchen.. 12.5 M-F 40 Mg Daily 2)  Welchol 625 Mg  Tabs (Colesevelam Hcl) .Marland Kitchen.. 1 At  Bedtime 3)  Klor-Con 20 Meq  Pack (Potassium Chloride) .... Take 1 Tablet Twice Weekly 4)  Adult Aspirin Low Strength 81 Mg  Tbdp (Aspirin) .Marland Kitchen.. 1 Once Daily 5)  Centrum Silver   Tabs (Multiple Vitamins-Minerals) .Marland Kitchen.. 1 Once Daily 6)  Pepcid Ac Maximum Strength 20 Mg Chew (Famotidine) .... One Tablet By Mouth Once Daily At Bedtime 7)  Alprazolam 0.25 Mg  Tbdp (Alprazolam) .... As Needed 8)  Mylanta 200-200-20 Mg/20ml Susp (Alum & Mag Hydroxide-Simeth) .... 2 Tablespoons At Bedtime and Prn  Allergies (verified): 1)  * Robinol Forte 2)  * Align  Vital Signs:  Patient profile:   77 year old female Height:      60.5 inches Weight:      166 pounds BMI:     32.00 BSA:     1.74 Pulse rate:   68 / minute Pulse rhythm:   regular BP sitting:   132 / 74  (left arm) Cuff size:   regular  Vitals Entered By: Ok Anis CMA (June 01, 2009 10:03 AM)  Physical Exam  Additional Exam:  Constitutional: generally well appearing Psychiatric: alert and oriented times 3 Abdomen: soft, non-tender, non-distended, normal bowel sounds    Impression & Recommendations:  Problem # 1:  nausea, chronic abdominal pains I think her symptoms are mainly functional. She has been in and out of the emergency room multiple times in the past few months. She has been calling my office in her primary care office as well. She's not seem very clear about what  medicines she is on or why she should be taking them. I recommended she double up on her MiraLax to 2 capsules a day and take this every day for constipation. I recommended she not take the antispasmodics medicines that were given to her by the emergency room as this can contribute to constipation. I have given her a prescription for Zofran, 4 mg pills and have advised her to take one pill every morning shortly after she wakes up. I will arrange for EGD to be done at her soonest convenience.  Patient Instructions: 1)  You will be scheduled to have an upper  endoscopy. 2)  Increase the miralax to two capfuls a day, every day. 3)  Will prescribe zofran 4mg  pills, take one pill every morning.  New prescription. 4)  A copy of this information will be sent to Dr. Petra Kuba. 5)  The medication list was reviewed and reconciled.  All changed / newly prescribed medications were explained.  A complete medication list was provided to the patient / caregiver. Prescriptions: ZOFRAN 4 MG  TABS (ONDANSETRON HCL) take one pill every morning  #30 x 11   Entered and Authorized by:   Rachael Fee MD   Signed by:   Rachael Fee MD on 06/01/2009   Method used:   [facility]        [facility] [phone]* (retail)       [address]       [place], [place]  [address]       Ph: [phone] or [phone]       Fax: [phone]   RxID:   [id number]   Appended Document: Orders Update/EGD    Clinical Lists Changes  Orders: Added new Test order of EGD (EGD) - Signed

## 2010-06-04 NOTE — Progress Notes (Signed)
Summary: Triage   Phone Note Call from Patient Call back at Home Phone [phone]   Caller: Patient Call For: Dr. Elberta Spaniel Reason for Call: Talk to Nurse Summary of Call: pt. said she is "sick as can be" and nothing is helping. Initial call taken by: Karna Christmas,  January 15, 2010 2:13 PM  Follow-up for Phone Call        Says she has epigastric pain as soon as she eats and at night and awakens with nausea .Pt. re-started Pepcid last night but it has not helped.Has Zofran but has not been using it.Instructed to use it q.a.m. as ordered.Told to avoid fried,greasy. spicy foods. Follow-up by: Teryl Lucy RN,  January 15, 2010 2:31 PM  Additional Follow-up for Phone Call Additional follow up Details #1::        ok.  I have thought that her memory was becoming a problem for her in past.  She is not taking her meds (pepcid, now zofran) like they were previously recommended.  I agree with the above rec's Additional Follow-up by: Rachael Fee MD,  January 15, 2010 2:39 PM

## 2010-06-04 NOTE — Progress Notes (Signed)
Summary: Triage  Medications Added NEXIUM 40 MG CPDR (ESOMEPRAZOLE MAGNESIUM) take 1 one half hour before breakfast       Phone Note Call from Patient Call back at Home Phone [phone]   Caller: Patient Call For: Dr. Christella Hartigan Reason for Call: Talk to Nurse Summary of Call: wants to know how long it will take for the Pepcid to take affect Initial call taken by: Karna Christmas,  January 23, 2010 4:48 PM  Follow-up for Phone Call        Says she still has epigastric pain especially at night.Says pepcid isn't helping. Asking if she should re-start Nexium. Follow-up by: Teryl Lucy RN,  January 24, 2010 8:20 AM  Additional Follow-up for Phone Call Additional follow up Details #1::        yes, one pill  every morning 20-30 min prior to BF meal.  rov in 6-7 weeks. Additional Follow-up by: Rachael Fee MD,  January 24, 2010 8:43 AM    Additional Follow-up for Phone Call Additional follow up Details #2::    Pt. ntfd. Follow-up by: Teryl Lucy RN,  January 24, 2010 11:21 AM  New/Updated Medications: NEXIUM 40 MG CPDR (ESOMEPRAZOLE MAGNESIUM) take 1 one half hour before breakfast Prescriptions: NEXIUM 40 MG CPDR (ESOMEPRAZOLE MAGNESIUM) take 1 one half hour before breakfast  #30 x 11   Entered by:   Teryl Lucy RN   Authorized by:   Rachael Fee MD   Signed by:   Rachael Fee MD on 01/24/2010   Method used:   [facility]        [facility]  [address] [phone]* (retail)       [address]       [place], [place]  [address]       Ph: [phone] or [phone]       Fax: [phone]   RxID:   [id number]

## 2010-06-04 NOTE — Letter (Signed)
Summary: Office Visit Letter  [facility]  [address] [place], [place] [address]   Phone: [phone]  Fax: [phone]      June 04, 2009 MRN: [id number]   [facility] [address] [place], [place]  [address]   Dear Ms. Palazzola,   According to our records, it is time for you to schedule a follow-up office visit with [place] in the month of March.   At your convenience, please call [phone] (option #2)to schedule an office visit. If you have any questions, concerns, or feel that this letter is in error, we would appreciate your call.   Sincerely,  Rachael Fee, M.D.  [facility] [phone]

## 2010-06-04 NOTE — Progress Notes (Signed)
Summary: Triage   Phone Note Call from Patient Call back at Home Phone [phone]   Caller: Patient Call For: Dr. Christella Hartigan Summary of Call: pt. said she is just "sick all over", abd pain and wants to know if she could change her meds. Initial call taken by: Karna Christmas,  January 14, 2010 2:27 PM  Follow-up for Phone Call        pt having nausea and umbilical pain, after stopping the Pepcid due to cost,   and just started back and needs a rx sent.  [facility] [address]  Pt was feeling well while taking the Pepcid.  Rx will be sent  I will advise Dr Christella Hartigan and call with any further recommendations Follow-up by: Chales Abrahams CMA Duncan Dull),  January 14, 2010 2:47 PM  Additional Follow-up for Phone Call Additional follow up Details #1::        i agree Additional Follow-up by: Rachael Fee MD,  January 14, 2010 2:55 PM    Prescriptions: PEPCID AC MAXIMUM STRENGTH 20 MG CHEW (FAMOTIDINE) one tablet by mouth once daily at bedtime  #30 x 11   Entered by:   Chales Abrahams CMA (AAMA)   Authorized by:   Rachael Fee MD   Signed by:   Chales Abrahams CMA (AAMA) on 01/14/2010   Method used:   [facility]        [facility]  [address] [phone]* (retail)       [address]       [place], [place]  [id number]       Ph: [phone] or [phone]       Fax: [phone]   RxID:   [id number]   Appended Document: Triage pt will call if symptoms worsen or do not improve

## 2010-07-17 LAB — DIFFERENTIAL
Basophils Absolute: 0 10*3/uL (ref 0.0–0.1)
Basophils Relative: 0 % (ref 0–1)
Eosinophils Absolute: 0.1 10*3/uL (ref 0.0–0.7)
Monocytes Absolute: 0.3 10*3/uL (ref 0.1–1.0)
Monocytes Relative: 7 % (ref 3–12)
Neutrophils Relative %: 61 % (ref 43–77)

## 2010-07-17 LAB — CARDIAC PANEL(CRET KIN+CKTOT+MB+TROPI)
CK, MB: 1.7 ng/mL (ref 0.3–4.0)
Relative Index: INVALID (ref 0.0–2.5)
Total CK: 78 U/L (ref 7–177)
Total CK: 81 U/L (ref 7–177)
Total CK: 88 U/L (ref 7–177)
Troponin I: 0.02 ng/mL (ref 0.00–0.06)

## 2010-07-17 LAB — BASIC METABOLIC PANEL
BUN: 11 mg/dL (ref 6–23)
CO2: 29 mEq/L (ref 19–32)
Calcium: 9.3 mg/dL (ref 8.4–10.5)
Glucose, Bld: 105 mg/dL — ABNORMAL HIGH (ref 70–99)
Sodium: 139 mEq/L (ref 135–145)

## 2010-07-17 LAB — CBC
Hemoglobin: 11.7 g/dL — ABNORMAL LOW (ref 12.0–15.0)
Hemoglobin: 12.6 g/dL (ref 12.0–15.0)
MCH: 28.3 pg (ref 26.0–34.0)
MCHC: 33 g/dL (ref 30.0–36.0)
MCHC: 33.2 g/dL (ref 30.0–36.0)
RBC: 4.16 MIL/uL (ref 3.87–5.11)
RDW: 13.9 % (ref 11.5–15.5)

## 2010-07-17 LAB — D-DIMER, QUANTITATIVE: D-Dimer, Quant: 0.33 ug/mL-FEU (ref 0.00–0.48)

## 2010-07-17 LAB — TSH: TSH: 2.679 u[IU]/mL (ref 0.350–4.500)

## 2010-07-17 LAB — LIPID PANEL
LDL Cholesterol: 107 mg/dL — ABNORMAL HIGH (ref 0–99)
VLDL: 18 mg/dL (ref 0–40)

## 2010-07-17 LAB — APTT: aPTT: 42 seconds — ABNORMAL HIGH (ref 24–37)

## 2010-07-17 LAB — PROTIME-INR: Prothrombin Time: 14.3 seconds (ref 11.6–15.2)

## 2010-07-17 LAB — POCT CARDIAC MARKERS: Myoglobin, poc: 78 ng/mL (ref 12–200)

## 2010-07-21 LAB — COMPREHENSIVE METABOLIC PANEL
ALT: 13 U/L (ref 0–35)
AST: 17 U/L (ref 0–37)
Albumin: 3.9 g/dL (ref 3.5–5.2)
Alkaline Phosphatase: 80 U/L (ref 39–117)
BUN: 10 mg/dL (ref 6–23)
Chloride: 103 mEq/L (ref 96–112)
GFR calc Af Amer: >60 mL/min (ref >60)
Potassium: 3.9 mEq/L (ref 3.5–5.1)
Sodium: 139 mEq/L (ref 135–145)
Total Bilirubin: 0.3 mg/dL (ref 0.3–1.2)

## 2010-07-21 LAB — URINALYSIS, ROUTINE W REFLEX MICROSCOPIC
Bilirubin Urine: NEGATIVE
Glucose, UA: NEGATIVE mg/dL
Hgb urine dipstick: NEGATIVE
Ketones, ur: NEGATIVE mg/dL
Protein, ur: NEGATIVE mg/dL
pH: 6.5 (ref 5.0–8.0)

## 2010-07-21 LAB — URINE MICROSCOPIC-ADD ON

## 2010-08-06 LAB — URINALYSIS, ROUTINE W REFLEX MICROSCOPIC
Glucose, UA: NEGATIVE mg/dL
Hgb urine dipstick: NEGATIVE
Ketones, ur: NEGATIVE mg/dL
Protein, ur: NEGATIVE mg/dL
Urobilinogen, UA: 0.2 mg/dL (ref 0.0–1.0)

## 2010-08-06 LAB — DIFFERENTIAL
Basophils Absolute: 0 10*3/uL (ref 0.0–0.1)
Basophils Relative: 1 % (ref 0–1)
Eosinophils Absolute: 0.1 10*3/uL (ref 0.0–0.7)
Lymphocytes Relative: 30 % (ref 12–46)
Monocytes Absolute: 0.3 10*3/uL (ref 0.1–1.0)
Neutro Abs: 2.4 10*3/uL (ref 1.7–7.7)
Neutrophils Relative %: 60 % (ref 43–77)

## 2010-08-06 LAB — CBC
HCT: 37.2 % (ref 36.0–46.0)
Hemoglobin: 12.7 g/dL (ref 12.0–15.0)
WBC: 4 10*3/uL (ref 4.0–10.5)

## 2010-08-06 LAB — COMPREHENSIVE METABOLIC PANEL
ALT: 14 U/L (ref 0–35)
Albumin: 3.8 g/dL (ref 3.5–5.2)
Alkaline Phosphatase: 80 U/L (ref 39–117)
BUN: 11 mg/dL (ref 6–23)
Chloride: 103 mEq/L (ref 96–112)
Glucose, Bld: 109 mg/dL — ABNORMAL HIGH (ref 70–99)
Potassium: 3.9 mEq/L (ref 3.5–5.1)
Sodium: 139 mEq/L (ref 135–145)
Total Bilirubin: 0.6 mg/dL (ref 0.3–1.2)

## 2010-08-12 LAB — HEMOCCULT GUIAC POC 1CARD (OFFICE): Fecal Occult Bld: POSITIVE

## 2010-08-12 LAB — POCT I-STAT, CHEM 8
BUN: 17 mg/dL (ref 6–23)
Calcium, Ion: 1.2 mmol/L (ref 1.12–1.32)
Hemoglobin: 13.9 g/dL (ref 12.0–15.0)
Sodium: 139 mEq/L (ref 135–145)
TCO2: 30 mmol/L (ref 0–100)

## 2010-08-12 LAB — CBC
HCT: 38.2 % (ref 36.0–46.0)
Hemoglobin: 12.8 g/dL (ref 12.0–15.0)
MCHC: 33.5 g/dL (ref 30.0–36.0)
MCV: 88.3 fL (ref 78.0–100.0)
Platelets: 256 10*3/uL (ref 150–400)
RDW: 14 % (ref 11.5–15.5)

## 2010-08-12 LAB — DIFFERENTIAL
Basophils Absolute: 0 10*3/uL (ref 0.0–0.1)
Basophils Relative: 0 % (ref 0–1)
Eosinophils Absolute: 0.1 10*3/uL (ref 0.0–0.7)
Eosinophils Relative: 1 % (ref 0–5)
Lymphocytes Relative: 31 % (ref 12–46)
Monocytes Absolute: 0.3 10*3/uL (ref 0.1–1.0)

## 2010-08-19 LAB — COMPREHENSIVE METABOLIC PANEL
ALT: 12 U/L (ref 0–35)
AST: 23 U/L (ref 0–37)
Albumin: 3.9 g/dL (ref 3.5–5.2)
CO2: 27 mEq/L (ref 19–32)
Calcium: 9.1 mg/dL (ref 8.4–10.5)
GFR calc Af Amer: >60 mL/min (ref >60)
Sodium: 138 mEq/L (ref 135–145)
Total Protein: 6.6 g/dL (ref 6.0–8.3)

## 2010-08-19 LAB — CK TOTAL AND CKMB (NOT AT ARMC)
Relative Index: INVALID (ref 0.0–2.5)
Total CK: 94 U/L (ref 7–177)

## 2010-08-20 LAB — URINALYSIS, ROUTINE W REFLEX MICROSCOPIC
Ketones, ur: NEGATIVE mg/dL
Nitrite: NEGATIVE
Protein, ur: NEGATIVE mg/dL

## 2010-08-20 LAB — URINE MICROSCOPIC-ADD ON

## 2010-08-20 LAB — POCT I-STAT, CHEM 8
BUN: 12 mg/dL (ref 6–23)
Calcium, Ion: 1.19 mmol/L (ref 1.12–1.32)
Chloride: 105 mEq/L (ref 96–112)
Glucose, Bld: 96 mg/dL (ref 70–99)
Potassium: 3.7 mEq/L (ref 3.5–5.1)

## 2010-08-20 LAB — URINE CULTURE
Colony Count: NO GROWTH
Culture: NO GROWTH

## 2010-09-17 NOTE — Assessment & Plan Note (Signed)
[facility]                         GASTROENTEROLOGY OFFICE NOTE   SHAUGHNESSY, GETHERS                       MRN:          [id number]  DATE:12/30/2006                            DOB:          11/04/1924    GASTROINTESTINAL PROBLEM LIST:  1. Chronic abdominal pain. Diagnosed as adhesive disease in the past      by Dr. Victorino Dike.  2. Chronic gastroesophageal reflux disease. Esophagogastroduodenoscopy      July 2002 showed no Barrett's esophagus.  3. History of colon polyps, last colonoscopy October 2004 by Dr. Victorino Dike showed no colon polyps. He wrote the indication for that      examination was adenomatous polyps. I do not see any pathology      reports in our chart indicating adenomatous polyps.   INTERVAL HISTORY:  Mr. Delfino was here 2 to 3 weeks ago. She is still  bothered by what sounds like chronic left and right sided abdominal  discomfort. She has mild nausea. She increased her Nexium, and it did  seem to help somewhat but not completely. She moves about fairly  regularly, and that does not seem to make a difference with the  discomfort. Looking back in her chart, she did have a central ventral  hernia with fat on CT scan March 2006. She is post cholecystectomy.   CURRENT MEDICATIONS:  1. Benicar.  2. Welchol.  3. Potassium.  4. Aspirin.  5. Centrum silver.  6. Nexium.  7. Vitamin C.  8. Align.   PHYSICAL EXAMINATION:  Weight 175 pounds, blood pressure 120/62, pulse  80.  CONSTITUTIONAL:  Generally well appearing.  ABDOMEN:  Soft, nontender, nondistended, normal bowel sounds.  EXTREMITIES:  No lower extremity edema.   ASSESSMENT AND PLAN:  An 77 year old woman with chronic intermittent  abdominal pain.   Her pain seemed to be worse when she is lying down rather than walking.  Eating and moving about do not seem to affect the pain. She does have a  ventral hernia seen by CT scan 2 years  ago. I will arrange for her to  have a repeat CT scan performed. Perhaps,  this ventral hernia is becoming symptomatic. She will return to see me  in 3 weeks or sooner if needed.     Rachael Fee, MD  Electronically Signed    DPJ/MedQ  DD: 12/30/2006  DT: 12/31/2006  Job #: [id number]   cc:   Mina Marble, M.D.

## 2010-09-17 NOTE — Assessment & Plan Note (Signed)
[facility]                         GASTROENTEROLOGY OFFICE NOTE   Ann Riddle, Ann Riddle                       MRN:          [id number]  DATE:12/15/2006                            DOB:          11/04/1924    PROBLEM:  Epigastric pain.   HISTORY:  Ann Riddle is an 77 year old African American female known to Dr.  Victorino Dike who has diagnosis of adhesive disease. She is status post  cholecystectomy, has chronic gastroesophageal reflux disease, history of  hypertension, hyperlipidemia, mild diverticulosis.   She last had colonoscopy in October of 2004, revealing only mild left-  sided diverticular disease. At this time, she presents complaining of  approximate one week history of epigastric discomfort, which she  describes as burning in nature, sometimes radiating up into her chest.  She does feel that this is related to acid reflux. She is on chronic  Nexium and had actually gone to the emergency room three days ago  because of her abdominal complaints and was told to increase her Nexium  to twice daily. She was also given a prescription for Vicodin to use on  a p.r.n. basis. She was afraid to take this and did not have it filled.  She has not had any nausea or vomiting. No fever or chills. Says that  her stools have been slightly looser than usual, perhaps two bowel  movements per day. She has not noted any melena or hematochezia. She  denies any dysphagia or odynophagia. She does feel that the b.i.d.  Nexium is helping. She had also been complaining of crampy lower  abdominal discomfort, which seems to be better at this point.   CURRENT MEDICATIONS:  1. Benicar 40/25 daily.  2. WelChol 625 q nightly.  3. K-Dur, I am not sure of the mg, twice weekly.  4. Aspirin 81 mg daily.  5. Centrum Silver daily.  6. Nexium 40 daily.  7. Vitamin C daily.   ALLERGIES:  No known drug allergies.   PHYSICAL EXAMINATION:  Well-developed, elderly African American  female  in no acute distress. Weight is 175, blood pressure 194/68, pulse in the  80s.  CARDIOVASCULAR: Regular rate and rhythm with S1 and S2. There is no rub  or gallop. She has a soft systolic murmur.  ABDOMEN: Large, soft. She is basically nontender. There is no palpable  mass or hepatosplenomegaly. Bowel sounds are active.  RECTAL: Was not done today.   IMPRESSION:  1. An 77 year old female with chronic gastroesophageal reflux disease      with exacerbation. Rule out esophagitis.  2. Mild lower abdominal discomfort. No evidence on examination or      recent labs for diverticulitis, suspect this is more functional      irritable bowel syndrome type symptoms.   PLAN:  1. Nexium b.i.d. x2 weeks and then decrease to once daily.  2. Robinul forte 2 mg p.o. q a.m.  3. Trial of Align one p.o. daily x3 weeks.  4. Followup in 2-3 weeks with Dr.  Christella Hartigan or sooner p.r.n.   ADDENDUM:  Labs are reviewed from recent ER visit and are completely  normal  including CBC, CMET and lipase. She also had plain abdominal films done  which were negative.      Mike Gip, PA-C  Electronically Signed      Rachael Fee, MD  Electronically Signed   AE/MedQ  DD: 12/15/2006  DT: 12/16/2006  Job #: [id number]

## 2010-09-17 NOTE — Assessment & Plan Note (Signed)
[facility]                         GASTROENTEROLOGY OFFICE NOTE   [name], [name]                       MRN:          [id number]  DATE:12/15/2006                            DOB:          11/04/1924    PROBLEM:  Upper and lower abdominal pain.   HISTORY:  [name] is a pleasant 77 year old female known to Dr. Victorino Dike  previously with history of hypertension and GERD. She has a history of  colon polyps, diverticulosis, and is status post cholecystectomy. Her  last colonoscopy was done in October 2004 revealing only left colon  diverticular disease. The patient had a recent ER visit on 12/12/2006 with  complaints of abdominal pain. She had workup with labs and plain  abdominal pains. The KUB was negative and her labs were unremarkable  with a WBC of 3.7, hemoglobin 13, hematocrit 38. Electrolytes were  within normal limits. BUN 9, creatinine 0.8. Liver function studies were  normal. UA was negative and lipase was 13. She was given a prescription  for some Vicodin, but did not get it filled because she was afraid to  take it. She says that she has been having burning epigastric discomfort  and some increase in her reflux symptoms which are chronic. She also was  having some lower abdominal discomfort which she says is better at this  point. Her bowel movements have been slightly loose for her, but she is  not having any frank diarrhea and is having two bowel movements per day.  She has not noted any melena or hematochezia. She says that her pain  feels like her reflux has in the past when it has been aggravated. She  did bump her Nexium up to twice a day over the past few days and that  does seem to be helping. She has no complaints of dysphagia or  odynophagia.   CURRENT MEDICATIONS:  1. Benicar 40/20 daily.  2. Welchol 625 nightly.  3. Potassium supplement twice weekly.  4. Aspirin 81 mg daily.  5. Centrum Silver daily.  6. Nexium 40 daily.  7. Vitamin C supplement.   ALLERGIES:  No known drug allergies.   PHYSICAL EXAMINATION:  GENERAL:  Well developed elderly African-American  female in no acute distress.  VITAL SIGNS:  Weight 175, blood pressure 194/68, pulse 80s.  CARDIOVASCULAR:  Regular rate and rhythm with S1 and S2.  ABDOMEN:  Large, soft, basically nontender. There is no mass or  hepatosplenomegaly. Bowel sounds are active.  RECTAL:  Not done at this time.   IMPRESSION:  1. This is an 77 year old female with chronic GERD with exacerbation      and possible esophagitis.  2. Mild non-focal lower abdominal discomfort; doubt any active      diverticulitis. I suspect that her symptoms are secondary to IBS.   PLAN:  1. Continue b.i.d. Nexium for two weeks and then decrease to once      daily thereafter.  2. Robinul Forte 2 mg q.a.m.  3. Trial of Align 1 p.o. daily for one month.  4. Follow up in 2 to 3 weeks with Dr.  Leone Payor.      Mike Gip, PA-C  Electronically Signed      Rachael Fee, MD  Electronically Signed   AE/MedQ  DD: 12/18/2006  DT: 12/19/2006  Job #: [id number]

## 2010-09-17 NOTE — Assessment & Plan Note (Signed)
[facility]                         GASTROENTEROLOGY OFFICE NOTE   BIANCA, RANERI                       MRN:          [id number]  DATE:04/19/2007                            DOB:          11/04/1924    GI PROBLEM LIST:  1. Chronic abdominal pain. Diagnosed as adhesive disease in the past      by Dr. Victorino Dike. CT scan September 2008 showed ventral hiatal      hernia was stable.  No acute findings in the abdomen or pelvis.  2. Chronic gastroesophageal reflux disease. Esophagogastroduodenoscopy      July 2002 showed no Barrett's esophagus. EGD October 2008 was      normal, except for a 2 cm hiatal hernia.  3. History of colon polyps, last colonoscopy October 2004 by Dr. Victorino Dike showed no colon polyps. He wrote the indication for that      examination was adenomatous polyps. I do not see any pathology      reports in our chart indicating adenomatous polyps.   INTERVAL HISTORY:  I last saw Retia at the time of her EGD in October.  I  started her on Levsin as needed.  She is still bothered by intermittent  lower abdominal discomfort.  She thinks certain foods, such as  chocolate, may be bringing it on.  She has mild chronic nausea.  She  also says she has to strain to move her bowels and sometimes will go 2  to 3 days without moving her bowels.   CURRENT MEDICATIONS:  Benicar.  Welchol.  Potassium.  Aspirin.  Nexium.  Centrum.  Vitamin C.   PHYSICAL EXAM:  Weight 178 pounds, which is up 3 pounds since her last  visit.  Blood pressure 144/64, pulse 80.  CONSTITUTIONAL:  Generally well-appearing.  ABDOMEN:  Soft, nontender, nondistended.  Normal bowel sounds.  EXTREMITIES:  No lower extremity edema.   ASSESSMENT AND PLAN:  An 77 year old woman with chronic intermittent  abdominal discomfort, mild constipation.   Welchol has the #1 side effect of causing constipation, so that is  probably at least contributing to some of her mild  constipation.  I  think a lot of her abdominal pain, given the above extensive and  essentially negative workup may be due to mild constipation pain, and so  I am recommending she increase prune juice to taking it every day,  instead of just as needed.  If that is not helpful, she will try MiraLax  1 scoop on a daily basis.  She will also discuss with her  primary care physician, Dr. Petra Kuba, about maybe changing or stopping  her Welchol.  She will return to see me in 2 months or sooner if needed.     Rachael Fee, MD  Electronically Signed    DPJ/MedQ  DD: 04/19/2007  DT: 04/19/2007  Job #: [id number]   cc:   Mina Marble, M.D.

## 2010-09-17 NOTE — Assessment & Plan Note (Signed)
[facility]                         GASTROENTEROLOGY OFFICE NOTE   [name], [name]                       MRN:          [id number]  DATE:05/14/2007                            DOB:          06-Jul-1921    GI PROBLEM LIST:  1. Chronic abdominal pain. Diagnosed as adhesive disease in the past      by Dr. Victorino Dike. CT scan September 2008 showed ventral hiatal      hernia was stable.  No acute findings in the abdomen or pelvis.  2. Chronic gastroesophageal reflux disease. Esophagogastroduodenoscopy      July 2002 showed no Barrett's esophagus. EGD October 2008 was      normal, except for a 2 cm hiatal hernia.  3. History of colon polyps, last colonoscopy October 2004 by Dr. Victorino Dike showed no colon polyps. He wrote the indication for that      examination was adenomatous polyps. I do not see any pathology      reports in our chart indicating adenomatous polyps.   INTERVAL HISTORY:  I last saw Ann Riddle one month ago.  She returns today  still bothered by constipation.  She has not tried holding her Welchol  as we discussed before.  I do not believe she has tried any MiraLax as  we discussed before.  She has had a big chicken meal on Christmas,  caused worse bubbling and gas in her stomach, and since then she has  doubled up on her Nexium.  She takes it about an hour before breakfast  and at bedtime.  She has also been having some chest pressures  intermittently.  These do not sound cardiac, specifically, but she last  saw cardiology 2 years ago and a stress test at that point was normal.  She also mentioned she is intermittently nauseous.  She has named  several foods that specifically cause her nausea such as collard greens,  onions, tomatoes, greasy foods.  She is asking about me getting her a  hospital-type bed at home because the apparatus she bought to lift the  head of her bed slightly lifts it too high and she falls off her bed.   CURRENT  MEDICINES:  1. Benicar.  2. Welchol.  3. Potassium.  4. Aspirin.  5. Centrum.  6. Nexium once daily.  7. Vitamin C.   PHYSICAL EXAMINATION:  Weight 170 pounds, which is down 8 pounds since  her last visit.  Blood pressure 144/62, pulse 78.  CONSTITUTIONAL:  Obese, otherwise well appearing.  ABDOMEN:  Soft, nontender, nondistended, normal bowel sounds.  EXTREMITIES:  No lower extremity edema.   ASSESSMENT AND PLAN:  An 77 year old woman with multiple upper and lower  gastrointestinal symptoms, likely all functional.   I recommended she change the way she is taking her Nexium to 20 to 30  minutes before breakfast, as well as 20 to 30 minutes before dinner  because that is the way the pill is designed to work most effectively.  Meanwhile, I will arrange for her to revisit with her cardiologist, Dr.  Angelina Sheriff,  who saw her last, to consider repeating workup for  intermittent chest discomforts.  I told her I would not be able to help  her with getting a hospital-type bed in her house, and that 1 brick  under the post at the head of her bed is really sufficient to help keep  gastric contents in the stomach.  She also asks about high blood sugar  that she was told she had in the emergency room.  I recommended that she  bring this up with Dr. Petra Kuba.  She is also planning to ask him  about holding her Welchol to see if her constipation improves.  She will  return to see me on an as-needed basis.     Rachael Fee, MD  Electronically Signed    DPJ/MedQ  DD: 05/14/2007  DT: 05/14/2007  Job #: [id number]   cc:   Mina Marble, M.D.

## 2010-09-20 NOTE — Assessment & Plan Note (Signed)
[facility]                         GASTROENTEROLOGY OFFICE NOTE   Ann Riddle, Ann Riddle                       MRN:          [id number]  DATE:07/22/2006                            DOB:          11/04/1924    Ann Riddle comes in on July 22, 2006.  She says she has got dizzy headaches,  that is what bothers her most, been this way for quite some time.  Has  tightness in her chest at times if she overeats, and does have some  reflux at times.  She is on Benicar, Welchol, potassium.  I told her she  had to be careful with the potassium, to make sure she watches her diet  carefully.  She says she understands.  She takes multivitamins and  Nexium 40 mg daily.  Otherwise, she says she is doing well.  No GI  symptoms other than above.   PHYSICAL EXAMINATION:  She weighed 180.  Blood pressure 130/72.  Pulse  84 and regular.  __________ unremarkable except for her heart revealing a grade 2/6  systolic ejection murmur.  ABDOMEN:  Soft and obese.  No bruits or rubs.  Non-tender.   IMPRESSION:  1. She is mildly obese.  2. Hypertension.  3. History of gastroesophageal reflux disease.  4. Hyperlipidemia.  5. Chronic headaches.   RECOMMENDATIONS:  She will be referred to Dr. Rudene Christians for  consultation concerning her headaches.     Ulyess Mort, MD  Electronically Signed    SML/MedQ  DD: 07/22/2006  DT: 07/22/2006  Job #: [id number]

## 2010-09-20 NOTE — Assessment & Plan Note (Signed)
[facility]                           GASTROENTEROLOGY OFFICE NOTE   DALANIE, KISNER                       MRN:          [id number]  DATE:03/23/2006                            DOB:          11/04/1924    Ann Riddle comes in noting that I am late for her appointment as she always does.  She says she is still having a little diarrhea, some tenderness in the left  lower quadrant, her appetite is decreased, some morning nausea. She has no  fever that she is aware of.   PHYSICAL EXAMINATION:  VITAL SIGNS:  She weighed 180, blood pressure 126/80,  pulse 80 and regular.  __________  all unremarkable except for the abdomen being somewhat tender on  palpation in the left lower quadrant consistent with possible  diverticulitis. Her last colonoscopic examination in 2004 only revealed  minimal diverticular disease but her examination certainly did indicate the  possibility that there was some inflammatory process noted.  RECTAL:  Deferred.  EXTREMITIES:  Unremarkable.   IMPRESSION:  1. Left lower quadrant abdominal pain, possible diverticulitis.  2. History of cholecystectomy and __________  gastritis.  3. Hyperlipidemia.  4. Mild obesity.  5. Gastroesophageal reflux disease.  6. Status post colon polyps with irritable bowel syndrome.   RECOMMENDATIONS:  Put her on some Cipro 500 mg 1 b.i.d. for 10 days. Try her  on Protonix b.i.d. I gave her a number of samples and hopefully this will be  helpful to her.     Ulyess Mort, MD  Electronically Signed    SML/MedQ  DD: 03/23/2006  DT: 03/24/2006  Job #: [id number]

## 2010-09-20 NOTE — Assessment & Plan Note (Signed)
[facility]                              CARDIOLOGY OFFICE NOTE   Ann Riddle, Ann Riddle                       MRN:          [id number]  DATE:12/01/2005                            DOB:          December 06, 1921    The primary is Dr. Corine Shelter.   REASON FOR PRESENTATION:  Patient as a recent ER visit for chest and arm  discomfort.   HISTORY OF PRESENT ILLNESS:  The patient is a 77 year old African-American  female who has been evaluated for chest pain in the past.  She has had a  stress perfusion study in 1999 and again in 2005 which was negative for any  evidence of ischemia.  She presented to the emergency room on November 02, 2005  complaining of left chest and arm discomfort.  This was different than her  previous reflux.  It happened at rest.  She became upset because of the arm  discomfort.  She ruled out for myocardial infarction and was to call and  schedule an appointment here.  She since was seen in the Gastroenterology  Department and not felt to have a GI source to these complaints and was  referred here.  She does have reflux but these symptoms were different.  She  did not have any nausea, vomiting, diaphoresis.  It was moderate.  It  happened at rest.  It went away as she got to the emergency room.  I do not  see that she was treated with nitrates.  There was no shortness of breath.  She had no PND or orthopnea.   Patient is also complaining of dizziness.  This happened sporadically  It  can happen while she is sitting.  She notices that if she takes off  her  glasses this goes away.  It can happen when she is standing.  She does have  some mild orthostatic symptoms.  She denies any frank syncope or pre-  syncope.  She does notice occasionally that she has palpitations with this.   PAST MEDICAL HISTORY:  1.  Irritable bowel syndrome.  2.  Hiatal hernia.  3.  Hyperlipidemia.   PAST SURGICAL HISTORY:  1.  Hiatal hernia repair.  2.   Cholecystectomy.   ALLERGIES:  None.   CURRENT MEDICATIONS:  1.  Welchol 625 mg q. h.s.  2.  Xanax.  3.  Hydrochlorothiazide 12.5 mg.  4.  Aspirin.  5.  Nexium 40 mg.  6.  Multivitamin.  7.  Benicar 40 mg q. day.  8.  Protonix 40 mg q. day.   REVIEW OF SYSTEMS:  As stated in the HPI.  Otherwise negative review of  systems.   PHYSICAL EXAMINATION:  GENERAL:  The patient is in no distress.  VITAL SIGNS:  Blood pressure 145/82 without and with orthostatic blood  pressure drop.  Heart rate 87 and regular.  HEENT:  Eyes unremarkable, pupils equal, round and reactive to light, fundi  within normal limits.  Oral mucosa unremarkable.  NECK:  No jugular venous distension.  Wave form within normal limits.  Carotid upstroke brisk and symmetric,  no bruits, thyromegaly.  LYMPHATICS:  No cervical, axillary, inguinal adenopathy.  LUNGS:  Clear to auscultation bilaterally.  BACK:  No costovertebral angle tenderness.  CHEST:  Unremarkable.  HEART:  PMI not displaced or sustained. S1 and S2 within normal limits.  No  S3.  No S4, no murmurs.  ABDOMEN:  Flat, positive bowel sounds.  Normal frequency, no pitch, no  bruits, no rebound or no guarding of the midline, pulsatile mass,  hepatomegaly or splenomegaly.  SKIN:  No rashes, no nodules.  EXTREMITIES:  2+ pulses, no edema, cyanosis, no clubbing.  NEUROLOGICAL:  Oriented to person, place, time.  Cranial nerves II -X11  grossly intact.  Motor grossly intact.   EKG:  Sinus rhythm, rate 89, axis within normal limits, intervals within  normal limits, no acute ST wave changes.   ASSESSMENT/PLAN:  1.  Patient's chest discomfort was worrisome this time as there was some      left arm discomfort.  This was different in previous reflux.  Given      this, a stress test was indicated.  However, she said she would not be      able to walk on a treadmill.  Therefore, she will have an adenosine      Cardiolite.  Further evaluation based on these  results.  2.  Dizziness.  She describes multiple types of dizziness.  I think there is      a small possibility this could be related to an arrhythmia.  I will have      her wear a 2-week event monitor.  3.  If the above testing is normal then no further cardiovascular testing      will be suggested.  If      the dizziness continues she might need to consider possibly an inner-ear      problem.  4.  Followup - we will see her back based on the results of the above.                               Rollene Rotunda, MD, [facility]    JH/MedQ  DD:  12/01/2005  DT:  12/01/2005  Job #:  [id number]   cc:   Mina Marble, MD

## 2010-09-25 ENCOUNTER — Inpatient Hospital Stay (INDEPENDENT_AMBULATORY_CARE_PROVIDER_SITE_OTHER)
Admission: RE | Admit: 2010-09-25 | Discharge: 2010-09-25 | Disposition: A | Discharge status: Home / Self Care | Payer: Medicare Other | Source: Ambulatory Visit | Attending: Family Medicine | Admitting: Family Medicine

## 2010-09-25 DIAGNOSIS — R198 Other specified symptoms and signs involving the digestive system and abdomen: Secondary | ICD-10-CM

## 2010-09-25 DIAGNOSIS — R609 Edema, unspecified: Secondary | ICD-10-CM

## 2010-09-25 DIAGNOSIS — I10 Essential (primary) hypertension: Secondary | ICD-10-CM

## 2010-09-25 LAB — POCT I-STAT, CHEM 8
BUN: 14 mg/dL (ref 6–23)
Chloride: 106 mEq/L (ref 96–112)
Creatinine, Ser: 1.1 mg/dL (ref 0.4–1.2)
Sodium: 142 mEq/L (ref 135–145)
TCO2: 30 mmol/L (ref 0–100)

## 2010-12-16 ENCOUNTER — Emergency Department (HOSPITAL_COMMUNITY)
Admission: EM | Admit: 2010-12-16 | Discharge: 2010-12-17 | Disposition: A | Discharge status: Home / Self Care | Payer: Medicare Other | Attending: Emergency Medicine | Admitting: Emergency Medicine

## 2010-12-16 ENCOUNTER — Emergency Department (HOSPITAL_COMMUNITY): Payer: Medicare Other

## 2010-12-16 ENCOUNTER — Inpatient Hospital Stay (INDEPENDENT_AMBULATORY_CARE_PROVIDER_SITE_OTHER)
Admission: RE | Admit: 2010-12-16 | Discharge: 2010-12-16 | Disposition: A | Discharge status: Home / Self Care | Payer: Medicare Other | Source: Ambulatory Visit | Attending: Emergency Medicine | Admitting: Emergency Medicine

## 2010-12-16 DIAGNOSIS — K219 Gastro-esophageal reflux disease without esophagitis: Secondary | ICD-10-CM | POA: Insufficient documentation

## 2010-12-16 DIAGNOSIS — I10 Essential (primary) hypertension: Secondary | ICD-10-CM | POA: Insufficient documentation

## 2010-12-16 DIAGNOSIS — R10814 Left lower quadrant abdominal tenderness: Secondary | ICD-10-CM

## 2010-12-16 DIAGNOSIS — R10813 Right lower quadrant abdominal tenderness: Secondary | ICD-10-CM

## 2010-12-16 DIAGNOSIS — E789 Disorder of lipoprotein metabolism, unspecified: Secondary | ICD-10-CM | POA: Insufficient documentation

## 2010-12-16 DIAGNOSIS — R109 Unspecified abdominal pain: Secondary | ICD-10-CM | POA: Insufficient documentation

## 2010-12-16 DIAGNOSIS — R10819 Abdominal tenderness, unspecified site: Secondary | ICD-10-CM | POA: Insufficient documentation

## 2010-12-16 DIAGNOSIS — E669 Obesity, unspecified: Secondary | ICD-10-CM | POA: Insufficient documentation

## 2010-12-16 DIAGNOSIS — R079 Chest pain, unspecified: Secondary | ICD-10-CM | POA: Insufficient documentation

## 2010-12-16 DIAGNOSIS — R197 Diarrhea, unspecified: Secondary | ICD-10-CM | POA: Insufficient documentation

## 2010-12-16 LAB — URINALYSIS, ROUTINE W REFLEX MICROSCOPIC
Glucose, UA: NEGATIVE mg/dL
Protein, ur: NEGATIVE mg/dL
pH: 6 (ref 5.0–8.0)

## 2010-12-16 LAB — URINE MICROSCOPIC-ADD ON

## 2010-12-17 LAB — CBC
Platelets: 222 10*3/uL (ref 150–400)
RBC: 4.62 MIL/uL (ref 3.87–5.11)
RDW: 13.4 % (ref 11.5–15.5)
WBC: 4 10*3/uL (ref 4.0–10.5)

## 2010-12-17 LAB — URINE CULTURE
Colony Count: NO GROWTH
Culture: NO GROWTH

## 2010-12-17 LAB — POCT I-STAT, CHEM 8
BUN: 9 mg/dL (ref 6–23)
Chloride: 103 mEq/L (ref 96–112)
HCT: 42 % (ref 36.0–46.0)
Potassium: 3.8 mEq/L (ref 3.5–5.1)
Sodium: 141 mEq/L (ref 135–145)

## 2010-12-17 LAB — DIFFERENTIAL
Basophils Absolute: 0 10*3/uL (ref 0.0–0.1)
Basophils Relative: 0 % (ref 0–1)
Eosinophils Absolute: 0.1 10*3/uL (ref 0.0–0.7)
Eosinophils Relative: 2 % (ref 0–5)
Lymphs Abs: 1.4 10*3/uL (ref 0.7–4.0)
Neutrophils Relative %: 56 % (ref 43–77)

## 2011-01-07 ENCOUNTER — Emergency Department (HOSPITAL_COMMUNITY)
Admission: EM | Admit: 2011-01-07 | Discharge: 2011-01-07 | Disposition: A | Discharge status: Home / Self Care | Payer: Medicare Other | Attending: Emergency Medicine | Admitting: Emergency Medicine

## 2011-01-07 DIAGNOSIS — E7800 Pure hypercholesterolemia, unspecified: Secondary | ICD-10-CM | POA: Insufficient documentation

## 2011-01-07 DIAGNOSIS — K219 Gastro-esophageal reflux disease without esophagitis: Secondary | ICD-10-CM | POA: Insufficient documentation

## 2011-01-07 DIAGNOSIS — R1033 Periumbilical pain: Secondary | ICD-10-CM | POA: Insufficient documentation

## 2011-01-07 DIAGNOSIS — K589 Irritable bowel syndrome without diarrhea: Secondary | ICD-10-CM | POA: Insufficient documentation

## 2011-01-07 DIAGNOSIS — I10 Essential (primary) hypertension: Secondary | ICD-10-CM | POA: Insufficient documentation

## 2011-01-07 LAB — COMPREHENSIVE METABOLIC PANEL
ALT: 10 U/L (ref 0–35)
AST: 14 U/L (ref 0–37)
Albumin: 4 g/dL (ref 3.5–5.2)
Alkaline Phosphatase: 79 U/L (ref 39–117)
Chloride: 100 mEq/L (ref 96–112)
Potassium: 3.5 mEq/L (ref 3.5–5.1)
Sodium: 137 mEq/L (ref 135–145)
Total Bilirubin: 0.4 mg/dL (ref 0.3–1.2)
Total Protein: 7.2 g/dL (ref 6.0–8.3)

## 2011-01-07 LAB — URINE MICROSCOPIC-ADD ON

## 2011-01-07 LAB — CBC
Hemoglobin: 12.6 g/dL (ref 12.0–15.0)
Platelets: 219 10*3/uL (ref 150–400)
RBC: 4.4 MIL/uL (ref 3.87–5.11)
WBC: 4.7 10*3/uL (ref 4.0–10.5)

## 2011-01-07 LAB — POCT I-STAT TROPONIN I: Troponin i, poc: 0 ng/mL (ref 0.00–0.08)

## 2011-01-07 LAB — DIFFERENTIAL
Basophils Relative: 0 % (ref 0–1)
Eosinophils Absolute: 0.1 10*3/uL (ref 0.0–0.7)
Monocytes Relative: 8 % (ref 3–12)
Neutro Abs: 3 10*3/uL (ref 1.7–7.7)
Neutrophils Relative %: 64 % (ref 43–77)

## 2011-01-07 LAB — URINALYSIS, ROUTINE W REFLEX MICROSCOPIC
Bilirubin Urine: NEGATIVE
Glucose, UA: NEGATIVE mg/dL
Protein, ur: NEGATIVE mg/dL
Specific Gravity, Urine: 1.011 (ref 1.005–1.030)
Urobilinogen, UA: 0.2 mg/dL (ref 0.0–1.0)

## 2011-01-13 ENCOUNTER — Telehealth: Payer: Self-pay | Admitting: Gastroenterology

## 2011-01-14 NOTE — Telephone Encounter (Signed)
Left message on machine to call back  

## 2011-01-15 NOTE — Telephone Encounter (Signed)
Pt wanted to have appt moved up sooner because of abd pain.  The pain is the same that she has had and is no worse.  She was seen in the ER 12/16/10 she was told she was having "spasms".  I advised the pt Dr Christella Hartigan has no openings before 02/10/11 and was put on the wait list.  Pt will call if she worsens

## 2011-01-28 LAB — COMPREHENSIVE METABOLIC PANEL
BUN: 10
CO2: 24
Calcium: 9
Chloride: 104
Creatinine, Ser: 0.8
GFR calc non Af Amer: >60
Total Bilirubin: 1.1

## 2011-01-28 LAB — URINALYSIS, ROUTINE W REFLEX MICROSCOPIC
Bilirubin Urine: NEGATIVE
Nitrite: NEGATIVE
Specific Gravity, Urine: 1.008
Urobilinogen, UA: 0.2

## 2011-01-28 LAB — CBC
HCT: 35.9 — ABNORMAL LOW
Hemoglobin: 12.2
MCHC: 34.1
MCV: 86
RBC: 4.18
WBC: 4.3

## 2011-01-28 LAB — DIFFERENTIAL
Basophils Absolute: 0
Lymphocytes Relative: 25
Neutro Abs: 3
Neutrophils Relative %: 68

## 2011-01-28 LAB — URINE MICROSCOPIC-ADD ON

## 2011-01-28 LAB — LIPASE, BLOOD: Lipase: 17

## 2011-01-31 LAB — URINALYSIS, MICROSCOPIC ONLY
Bilirubin Urine: NEGATIVE
Glucose, UA: NEGATIVE
Ketones, ur: NEGATIVE
Nitrite: NEGATIVE
Urobilinogen, UA: 0.2
pH: 7

## 2011-01-31 LAB — POCT URINALYSIS DIP (DEVICE)
Ketones, ur: NEGATIVE
Operator id: 29721
Protein, ur: NEGATIVE
Urobilinogen, UA: 0.2

## 2011-01-31 LAB — URINE CULTURE

## 2011-01-31 LAB — POCT I-STAT, CHEM 8
BUN: 11
HCT: 41
Hemoglobin: 13.9
Sodium: 137
TCO2: 30

## 2011-01-31 LAB — CBC
HCT: 39.5
Platelets: 293
RDW: 13.8

## 2011-01-31 LAB — DIFFERENTIAL
Basophils Absolute: 0
Eosinophils Relative: 2
Lymphocytes Relative: 27

## 2011-02-07 LAB — COMPREHENSIVE METABOLIC PANEL
Albumin: 3.9
BUN: 9
Calcium: 8.7
Chloride: 104
Creatinine, Ser: 0.86
GFR calc non Af Amer: >60
Total Bilirubin: 0.5

## 2011-02-07 LAB — DIFFERENTIAL
Basophils Absolute: 0
Eosinophils Relative: 2
Lymphocytes Relative: 34
Monocytes Relative: 8
Neutro Abs: 1.8

## 2011-02-07 LAB — CBC
HCT: 34.9 — ABNORMAL LOW
MCHC: 34.4
MCV: 86.4
Platelets: 269
WBC: 3.3 — ABNORMAL LOW

## 2011-02-07 LAB — LIPASE, BLOOD: Lipase: 14

## 2011-02-10 ENCOUNTER — Ambulatory Visit (INDEPENDENT_AMBULATORY_CARE_PROVIDER_SITE_OTHER): Payer: Medicare Other | Admitting: Gastroenterology

## 2011-02-10 ENCOUNTER — Encounter: Payer: Self-pay | Admitting: Gastroenterology

## 2011-02-10 VITALS — BP 140/60 | HR 60 | Ht 62.0 in | Wt 164.0 lb

## 2011-02-10 DIAGNOSIS — K3189 Other diseases of stomach and duodenum: Secondary | ICD-10-CM

## 2011-02-10 DIAGNOSIS — R1013 Epigastric pain: Secondary | ICD-10-CM

## 2011-02-10 MED ORDER — FAMOTIDINE 20 MG PO TABS: 40.0000 mg | ORAL_TABLET | Freq: Two times a day (BID) | ORAL | Status: DC

## 2011-02-10 NOTE — Patient Instructions (Signed)
Stop taking nexium (you didn' t feel it helped as well as pepcid). New prescription for pepcid called into your pharmacy, take this twice daily. A copy of this information will be made available to Dr. Petra Kuba.

## 2011-02-10 NOTE — Progress Notes (Signed)
Review of gastrointestinal problems:  1. Chronic abdominal pain. Diagnosed as adhesive disease in the past by Dr. Victorino Dike. CT scan September 2008 showed ventral hiatal hernia was stable. No acute findings in the abdomen or pelvis. January, 2010: repeat pains, went to emergency room, complete metabolic profile, amylase, lipase were normal. CT Scan January, 2010 with IV and oral contrast of abdomen and pelvis was normal. CT Scan December, 2010 done by ER, essentially normal.  EGD January 2011: Mild gastritis, duodenitis. Biopsies showed no H. pylori. 2. Chronic gastroesophageal reflux disease. Esophagogastroduodenoscopy July 2002 showed no Barrett's esophagus. EGD October 2008 was normal, except for a 2 cm hiatal hernia.  3. History of colon polyps, last colonoscopy October 2004 by Dr. Victorino Dike showed no colon polyps. He wrote the indication for that examination was adenomatous polyps. I do not see any pathology reports in our chart indicating adenomatous polyps. Repeat colonoscopy 01/2008 by [facility] was normal to terminal ileum except for small hemorrhoids.  4. Status post remote cholecystectomy  5. Nausea: Winter, 2010; EGD January 2011, see above   HPI: This is a  pleasant 76 year old woman whom I last saw almost 2 years ago. She is here with her usual chronic complaints of nausea, intermittent abdominal discomfort. These have been thoroughly worked up, see above.  She's been "sick about 2 months."  Abdominal pains. Eventually went to ER and was told she has spasms.  Was given antispasm medicine by ER.  This bentyl helps quite well with the pains.  Thinks that pepcid helped her indigestion better than nexium that she is currently taking.    Past Medical History:   Irritable bowel syndrome                                     Diverticulosis of colon (without mention of he*              Other and unspecified hyperlipidemia                         Unspecified essential hypertension                            Diaphragmatic hernia without mention of obstru*              Esophageal reflux                                            Ventral hernia, unspecified, without mention o*             Past Surgical History:   HIATAL HERNIA REPAIR                                         CHOLECYSTECTOMY                                              reports that she has never smoked. She has never used smokeless tobacco. She reports that she does not drink alcohol or use illicit drugs.  family history is negative for Colon cancer.    Current medicines and allergies were reviewed in [facility]    Physical Exam: BP 140/60  Pulse 60  Ht 5\' 2"  (1.575 m)  Wt 164 lb (74.39 kg)  BMI 30.00 kg/m2 Constitutional: generally well-appearing Psychiatric: alert and oriented x3 Abdomen: soft, nontender, nondistended, no obvious ascites, no peritoneal signs, normal bowel sounds     Assessment and plan: 77 y.o. female with chronic nausea, chronic intermittent abdominal pains. Likely these are all functional, perhaps somewhat GERD related. She is pretty convinced that test that has worked better for her than Nexium and so I am removing Nexium from her medicine list I called her in a new prescription to her pharmacy for Pepcid. She knows to call if she has any further troubles or concerns.

## 2011-02-17 LAB — URINALYSIS, ROUTINE W REFLEX MICROSCOPIC
Glucose, UA: NEGATIVE
Hgb urine dipstick: NEGATIVE
Specific Gravity, Urine: 1.004 — ABNORMAL LOW
pH: 7

## 2011-02-17 LAB — DIFFERENTIAL
Basophils Relative: 0
Lymphocytes Relative: 32
Lymphs Abs: 1.2
Monocytes Absolute: 0.2
Monocytes Relative: 7
Neutro Abs: 2.2
Neutrophils Relative %: 59

## 2011-02-17 LAB — COMPREHENSIVE METABOLIC PANEL
Albumin: 3.8
Alkaline Phosphatase: 74
BUN: 9
Calcium: 9
Potassium: 3.7
Total Protein: 7

## 2011-02-17 LAB — URINE MICROSCOPIC-ADD ON

## 2011-02-17 LAB — CBC
HCT: 38.2
Hemoglobin: 13
RBC: 4.37

## 2011-02-28 ENCOUNTER — Other Ambulatory Visit (HOSPITAL_COMMUNITY): Payer: Self-pay | Admitting: Obstetrics and Gynecology

## 2011-02-28 DIAGNOSIS — E041 Nontoxic single thyroid nodule: Secondary | ICD-10-CM

## 2011-03-03 ENCOUNTER — Other Ambulatory Visit (HOSPITAL_COMMUNITY): Payer: Medicare Other

## 2011-03-05 ENCOUNTER — Ambulatory Visit (HOSPITAL_COMMUNITY)
Admission: RE | Admit: 2011-03-05 | Discharge: 2011-03-05 | Disposition: A | Discharge status: Home / Self Care | Payer: Medicare Other | Source: Ambulatory Visit | Attending: Obstetrics and Gynecology | Admitting: Obstetrics and Gynecology

## 2011-03-05 DIAGNOSIS — E041 Nontoxic single thyroid nodule: Secondary | ICD-10-CM | POA: Insufficient documentation

## 2011-04-17 ENCOUNTER — Other Ambulatory Visit: Payer: Self-pay | Admitting: Gastroenterology

## 2011-05-21 ENCOUNTER — Other Ambulatory Visit: Payer: Self-pay | Admitting: Gastroenterology

## 2011-07-16 ENCOUNTER — Other Ambulatory Visit: Payer: Self-pay | Admitting: Gastroenterology

## 2011-07-17 ENCOUNTER — Encounter (HOSPITAL_COMMUNITY): Payer: Self-pay | Admitting: *Deleted

## 2011-07-17 ENCOUNTER — Emergency Department (HOSPITAL_COMMUNITY): Payer: Medicare Other

## 2011-07-17 ENCOUNTER — Other Ambulatory Visit: Payer: Self-pay

## 2011-07-17 ENCOUNTER — Emergency Department (HOSPITAL_COMMUNITY)
Admission: EM | Admit: 2011-07-17 | Discharge: 2011-07-17 | Disposition: A | Discharge status: Home / Self Care | Payer: Medicare Other | Attending: Emergency Medicine | Admitting: Emergency Medicine

## 2011-07-17 DIAGNOSIS — I10 Essential (primary) hypertension: Secondary | ICD-10-CM | POA: Insufficient documentation

## 2011-07-17 DIAGNOSIS — R079 Chest pain, unspecified: Secondary | ICD-10-CM | POA: Insufficient documentation

## 2011-07-17 DIAGNOSIS — Z79899 Other long term (current) drug therapy: Secondary | ICD-10-CM | POA: Insufficient documentation

## 2011-07-17 DIAGNOSIS — K589 Irritable bowel syndrome without diarrhea: Secondary | ICD-10-CM | POA: Insufficient documentation

## 2011-07-17 DIAGNOSIS — R12 Heartburn: Secondary | ICD-10-CM | POA: Insufficient documentation

## 2011-07-17 DIAGNOSIS — E7800 Pure hypercholesterolemia, unspecified: Secondary | ICD-10-CM | POA: Insufficient documentation

## 2011-07-17 LAB — URINALYSIS, ROUTINE W REFLEX MICROSCOPIC
Bilirubin Urine: NEGATIVE
Ketones, ur: NEGATIVE mg/dL
Nitrite: NEGATIVE
Specific Gravity, Urine: 1.013 (ref 1.005–1.030)
Urobilinogen, UA: 0.2 mg/dL (ref 0.0–1.0)
pH: 7 (ref 5.0–8.0)

## 2011-07-17 LAB — CBC
MCV: 85.9 fL (ref 78.0–100.0)
Platelets: 219 10*3/uL (ref 150–400)
RBC: 4.34 MIL/uL (ref 3.87–5.11)
RDW: 13.2 % (ref 11.5–15.5)
WBC: 3.8 10*3/uL — ABNORMAL LOW (ref 4.0–10.5)

## 2011-07-17 LAB — BASIC METABOLIC PANEL
CO2: 28 mEq/L (ref 19–32)
Calcium: 9.6 mg/dL (ref 8.4–10.5)
Chloride: 108 mEq/L (ref 96–112)
Creatinine, Ser: 0.73 mg/dL (ref 0.50–1.10)
GFR calc Af Amer: 88 mL/min — ABNORMAL LOW (ref >90)
Sodium: 142 mEq/L (ref 135–145)

## 2011-07-17 LAB — TROPONIN I: Troponin I: <0.3 ng/mL (ref <0.30)

## 2011-07-17 MED ORDER — ESOMEPRAZOLE MAGNESIUM 40 MG PO CPDR: 40.0000 mg | DELAYED_RELEASE_CAPSULE | Freq: Every day | ORAL | Status: DC

## 2011-07-17 MED ORDER — ONDANSETRON HCL 4 MG/2ML IJ SOLN
4.0000 mg | Freq: Once | INTRAMUSCULAR | Status: AC
  Administered 2011-07-17: 4 mg via INTRAVENOUS
  Filled 2011-07-17: qty 2

## 2011-07-17 MED ORDER — GI COCKTAIL ~~LOC~~
30.0000 mL | Freq: Once | ORAL | Status: AC
  Administered 2011-07-17: 30 mL via ORAL
  Filled 2011-07-17: qty 30

## 2011-07-17 MED ORDER — ASPIRIN 81 MG PO CHEW
324.0000 mg | CHEWABLE_TABLET | Freq: Once | ORAL | Status: AC
  Administered 2011-07-17: 324 mg via ORAL
  Filled 2011-07-17: qty 4
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
## 2011-07-17 NOTE — ED Notes (Signed)
IV TEAM @ BEDSIDE

## 2011-07-17 NOTE — Discharge Instructions (Signed)
Diet for GERD or PUD Nutrition therapy can help ease the discomfort of gastroesophageal reflux disease (GERD) and peptic ulcer disease (PUD).  HOME CARE INSTRUCTIONS   Eat your meals slowly, in a relaxed setting.   Eat 5 to 6 small meals per day.   If a food causes distress, stop eating it for a period of time.  FOODS TO AVOID  Coffee, regular or decaffeinated.   Cola beverages, regular or low calorie.   Tea, regular or decaffeinated.   Pepper.   Cocoa.   High fat foods, including meats.   Butter, margarine, hydrogenated oil (trans fats).   Peppermint or spearmint (if you have GERD).   Fruits and vegetables if not tolerated.   Alcohol.   Nicotine (smoking or chewing). This is one of the most potent stimulants to acid production in the gastrointestinal tract.   Any food that seems to aggravate your condition.  If you have questions regarding your diet, ask your caregiver or a registered dietitian. TIPS  Lying flat may make symptoms worse. Keep the head of your bed raised 6 to 9 inches (15 to 23 cm) by using a foam wedge or blocks under the legs of the bed.   Do not lay down until 3 hours after eating a meal.   Daily physical activity may help reduce symptoms.  MAKE SURE YOU:   Understand these instructions.   Will watch your condition.   Will get help right away if you are not doing well or get worse.  Document Released: 04/21/2005 Document Revised: 04/10/2011 Document Reviewed: 03/07/2011 [facility] Patient Information 2012 [place], [place].Gastroesophageal Reflux Disease, Adult Gastroesophageal reflux disease (GERD) happens when acid from your stomach flows up into the esophagus. When acid comes in contact with the esophagus, the acid causes soreness (inflammation) in the esophagus. Over time, GERD may create small holes (ulcers) in the lining of the esophagus. CAUSES   Increased body weight. This puts pressure on the stomach, making acid rise from the stomach into  the esophagus.   Smoking. This increases acid production in the stomach.   Drinking alcohol. This causes decreased pressure in the lower esophageal sphincter (valve or ring of muscle between the esophagus and stomach), allowing acid from the stomach into the esophagus.   Late evening meals and a full stomach. This increases pressure and acid production in the stomach.   A malformed lower esophageal sphincter.  Sometimes, no cause is found. SYMPTOMS   Burning pain in the lower part of the mid-chest behind the breastbone and in the mid-stomach area. This may occur twice a week or more often.   Trouble swallowing.   Sore throat.   Dry cough.   Asthma-like symptoms including chest tightness, shortness of breath, or wheezing.  DIAGNOSIS  Your caregiver may be able to diagnose GERD based on your symptoms. In some cases, X-rays and other tests may be done to check for complications or to check the condition of your stomach and esophagus. TREATMENT  Your caregiver may recommend over-the-counter or prescription medicines to help decrease acid production. Ask your caregiver before starting or adding any new medicines.  HOME CARE INSTRUCTIONS   Change the factors that you can control. Ask your caregiver for guidance concerning weight loss, quitting smoking, and alcohol consumption.   Avoid foods and drinks that make your symptoms worse, such as:   Caffeine or alcoholic drinks.   Chocolate.   Peppermint or mint flavorings.   Garlic and onions.   Spicy foods.  Citrus fruits, such as oranges, lemons, or limes.   Tomato-based foods such as sauce, chili, salsa, and pizza.   Fried and fatty foods.   Avoid lying down for the 3 hours prior to your bedtime or prior to taking a nap.   Eat small, frequent meals instead of large meals.   Wear loose-fitting clothing. Do not wear anything tight around your waist that causes pressure on your stomach.   Raise the head of your bed 6 to 8  inches with wood blocks to help you sleep. Extra pillows will not help.   Only take over-the-counter or prescription medicines for pain, discomfort, or fever as directed by your caregiver.   Do not take aspirin, ibuprofen, or other nonsteroidal anti-inflammatory drugs (NSAIDs).  SEEK IMMEDIATE MEDICAL CARE IF:   You have pain in your arms, neck, jaw, teeth, or back.   Your pain increases or changes in intensity or duration.   You develop nausea, vomiting, or sweating (diaphoresis).   You develop shortness of breath, or you faint.   Your vomit is green, yellow, black, or looks like coffee grounds or blood.   Your stool is red, bloody, or black.  These symptoms could be signs of other problems, such as heart disease, gastric bleeding, or esophageal bleeding. MAKE SURE YOU:   Understand these instructions.   Will watch your condition.   Will get help right away if you are not doing well or get worse.  Document Released: 01/29/2005 Document Revised: 04/10/2011 Document Reviewed: 11/08/2010 [facility] Patient Information 2012 [place], [place].Heartburn Heartburn is a painful, burning sensation in the chest. It may feel worse in certain positions, such as lying down or bending over. It is caused by stomach acid backing up into the tube that carries food from the mouth down to the stomach (lower esophagus).  CAUSES   Large meals.   Certain foods and drinks.   Exercise.   Increased acid production.   Being overweight or obese.   Certain medicines.  SYMPTOMS   Burning pain in the chest or lower throat.   Bitter taste in the mouth.   Coughing.  DIAGNOSIS  If the usual treatments for heartburn do not improve your symptoms, then tests may be done to see if there is another condition present. Possible tests may include:  X-rays.   Endoscopy. This is when a tube with a light and a camera on the end is used to examine the esophagus and the stomach.   A test to measure the amount  of acid in the esophagus (pH test).   A test to see if the esophagus is working properly (esophageal manometry).   Blood, breath, or stool tests to check for bacteria that cause ulcers.  TREATMENT   Your caregiver may tell you to use certain over-the-counter medicines (antacids, acid reducers) for mild heartburn.   Your caregiver may prescribe medicines to decrease the acid in your stomach or protect your stomach lining.   Your caregiver may recommend certain diet changes.   For severe cases, your caregiver may recommend that the head of your bed be elevated on blocks. (Sleeping with more pillows is not an effective treatment as it only changes the position of your head and does not improve the main problem of stomach acid refluxing into the esophagus.)  HOME CARE INSTRUCTIONS   Take all medicines as directed by your caregiver.   Raise the head of your bed by putting blocks under the legs if instructed to by your caregiver.  Do not exercise right after eating.   Avoid eating 2 or 3 hours before bed. Do not lie down right after eating.   Eat small meals throughout the day instead of 3 large meals.   Stop smoking if you smoke.   Maintain a healthy weight.   Identify foods and beverages that make your symptoms worse and avoid them. Foods you may want to avoid include:   Peppers.   Chocolate.   High-fat foods, including fried foods.   Spicy foods.   Garlic and onions.   Citrus fruits, including oranges, grapefruit, lemons, and limes.   Food containing tomatoes or tomato products.   Mint.   Carbonated drinks, caffeinated drinks, and alcohol.   Vinegar.  SEEK IMMEDIATE MEDICAL CARE IF:  You have severe chest pain that goes down your arm or into your jaw or neck.   You feel sweaty, dizzy, or lightheaded.   You are short of breath.   You vomit blood.   You have difficulty or pain with swallowing.   You have bloody or black, tarry stools.   You have episodes  of heartburn more than 3 times a week for more than 2 weeks.  MAKE SURE YOU:  Understand these instructions.   Will watch your condition.   Will get help right away if you are not doing well or get worse.  Document Released: 09/07/2008 Document Revised: 04/10/2011 Document Reviewed: 10/06/2010 [facility] Patient Information 2012 [place], [place].

## 2011-07-17 NOTE — ED Notes (Signed)
Pt reports having epigastric pain radiating to bilateral chest with "fullness feeling"  Onset yesterday morning, constant in nature. Worsening with movement. Reports taking tylenol at home with unknown relief. Reports chronic reflux discomfort accompanied with nausea

## 2011-07-17 NOTE — ED Notes (Signed)
Attempted IV insertion, pt unable to tolerate, blood returned and unable to flush IV. Pt requesting IV team to insert. IV team paged

## 2011-07-17 NOTE — ED Provider Notes (Signed)
History     CSN: [id number]  Arrival date & time 07/17/11  0542   First MD Initiated Contact with Patient 07/17/11 0543      Chief Complaint  Patient presents with  . Chest Pain    (Consider location/radiation/quality/duration/timing/severity/associated sxs/prior treatment) HPI  Patient presents to the emergency department with complaints of having chest and epigastric pain since yesterday morning. The pain has been intermittent but never completely resolve. The patient takes Mylanta, Bentyl, Pepcid and Zofran at home for her GERD. Patient now 5 history of IBS and diverticulosis. The patient denies having episodes of nausea, vomiting, diarrhea, diaphoresis. The patient denies having a cardiologist of significant cardiac history. She does admit to having had a stress test and echo done in the past but can not remember when. She also THINKS that they test results came back positive. She is here with a family member who does not have much for detail to add to HPI.  Past Medical History  Diagnosis Date  . Irritable bowel syndrome   . Diverticulosis of colon (without mention of hemorrhage)   . Other and unspecified hyperlipidemia   . Unspecified essential hypertension   . Diaphragmatic hernia without mention of obstruction or gangrene   . Esophageal reflux   . Ventral hernia, unspecified, without mention of obstruction or gangrene     Past Surgical History  Procedure Date  . Hiatal hernia repair   . Cholecystectomy     Family History  Problem Relation Age of Onset  . Colon cancer Neg Hx     History  Substance Use Topics  . Smoking status: Never Smoker   . Smokeless tobacco: Never Used  . Alcohol Use: No    OB History    Grav Para Term Preterm Abortions TAB SAB Ect Mult Living                  Review of Systems  All other systems reviewed and are negative.    Allergies  Align  Home Medications   Current Outpatient Rx  Name Route Sig Dispense Refill  .  ALPRAZOLAM 0.25 MG PO TABS Oral Take 0.25 mg by mouth at bedtime as needed.     Marland Kitchen ALUM & MAG HYDROXIDE-SIMETH 200-200-20 MG/5ML PO SUSP Oral Take 5 mLs by mouth daily as needed. indigestion    . ASPIRIN 81 MG PO TABS Oral Take 81 mg by mouth daily.      . COLESEVELAM HCL 625 MG PO TABS Oral Take 625 mg by mouth 2 (two) times daily with a meal.     . DICYCLOMINE HCL 20 MG PO TABS Oral Take 20 mg by mouth every 6 (six) hours as needed. Stomach spasms    . CENTRUM SILVER PO Oral Take by mouth. 1 daily      . OLMESARTAN MEDOXOMIL-HCTZ 40-25 MG PO TABS Oral Take 1 tablet by mouth daily.     Marland Kitchen ONDANSETRON HCL 4 MG PO TABS Oral Take 4 mg by mouth once. nausea    . PROMETHAZINE HCL 25 MG PO TABS Oral Take 25 mg by mouth 2 (two) times daily. nausea    . ESOMEPRAZOLE MAGNESIUM 40 MG PO CPDR Oral Take 1 capsule (40 mg total) by mouth daily. 30 capsule 0    Pt only to take medication for 1 month then contin ...  . FAMOTIDINE 20 MG PO TABS  TAKE 2 TABLETS (40 MG TOTAL) BY MOUTH 2 (TWO) TIMES DAILY. 30 tablet 1    BP  167/97  Pulse 78  Temp(Src) 97.7 F (36.5 C) (Oral)  Resp 15  SpO2 99%  Physical Exam  Nursing note and vitals reviewed. Constitutional: She is oriented to person, place, and time. She appears well-developed and well-nourished. No distress.  HENT:  Head: Normocephalic and atraumatic.  Eyes: Pupils are equal, round, and reactive to light.  Neck: Normal range of motion. Neck supple.  Cardiovascular: Normal rate and regular rhythm.   Pulmonary/Chest: Effort normal and breath sounds normal. No respiratory distress. She has no wheezes.  Abdominal: Soft. Bowel sounds are normal. She exhibits no distension. There is tenderness (epigastrum). There is no rebound and no guarding.  Neurological: She is oriented to person, place, and time.  Skin: Skin is warm and dry. She is not diaphoretic.    ED Course  Procedures (including critical care time)  Labs Reviewed  CBC - Abnormal; Notable for  the following:    WBC 3.8 (*)    All other components within normal limits  BASIC METABOLIC PANEL - Abnormal; Notable for the following:    Glucose, Bld 119 (*)    GFR calc non Af Amer 76 (*)    GFR calc Af Amer 88 (*)    All other components within normal limits  TROPONIN I  URINALYSIS, ROUTINE W REFLEX MICROSCOPIC   Dg Chest 2 View  07/17/2011  *RADIOLOGY REPORT*  Clinical Data: Chest pain, dizziness, cough.  CHEST - 2 VIEW  Comparison: 02/05/2010  Findings: Stable cardiomediastinal contours.  Aortic arch atherosclerosis.  Heart size upper normal limits.  Mild right lower lobe opacity.  Surgical clips right upper quadrant.  No pleural effusion or pneumothorax.  Multilevel degenerative change and mild rightward curvature of the thoracic spine.  IMPRESSION: Minimal right lower lobe atelectasis.  Original Report Authenticated By: Waneta Martins, M.D.     1. Heartburn       MDM     Date: 07/17/2011  Rate: 76  Rhythm: normal sinus rhythm  QRS Axis: indeterminate Tall R wave in V2, consider RVH or PMI  Intervals: normal  ST/T Wave abnormalities: normal  Conduction Disutrbances:none  Narrative Interpretation: baseline wander in leads V1  Old EKG Reviewed: changes noted new tall R waves in V2, Sept 4, 2012    Pt evaluated by Dr. Preston Fleeting and myself who agrees that symptoms appear to be most consistent with heartburn. Pt has a GI Dr. Rob Bunting. Will give patient prescription for Nexium for 1 month and have her follow-up with her GI doctor. We both do not feel that the symptoms are cardiac in nature and first Troponin is negative.      Dorthula Matas, PA 07/17/11 0930

## 2011-07-17 NOTE — ED Notes (Signed)
Pt requesting to speak with MD before discharge. Dr Preston Fleeting made aware and PA Neva Seat

## 2011-07-21 NOTE — ED Provider Notes (Signed)
Medical screening examination/treatment/procedure(s) were performed by non-physician practitioner and as supervising physician I was immediately available for consultation/collaboration.   Dione Booze, MD 07/21/11 [phone]

## 2011-09-02 ENCOUNTER — Other Ambulatory Visit: Payer: Self-pay | Admitting: Otolaryngology

## 2011-09-02 DIAGNOSIS — H905 Unspecified sensorineural hearing loss: Secondary | ICD-10-CM

## 2011-09-10 ENCOUNTER — Ambulatory Visit
Admission: RE | Admit: 2011-09-10 | Discharge: 2011-09-10 | Disposition: A | Discharge status: Home / Self Care | Payer: Medicare Other | Source: Ambulatory Visit | Attending: Otolaryngology | Admitting: Otolaryngology

## 2011-09-10 DIAGNOSIS — H905 Unspecified sensorineural hearing loss: Secondary | ICD-10-CM

## 2011-09-10 MED ORDER — GADOBENATE DIMEGLUMINE 529 MG/ML IV SOLN
15.0000 mL | Freq: Once | INTRAVENOUS | Status: AC | PRN
  Administered 2011-09-10: 15 mL via INTRAVENOUS

## 2011-10-27 ENCOUNTER — Ambulatory Visit (INDEPENDENT_AMBULATORY_CARE_PROVIDER_SITE_OTHER): Payer: Medicare Other | Admitting: Gastroenterology

## 2011-10-27 ENCOUNTER — Encounter: Payer: Self-pay | Admitting: Gastroenterology

## 2011-10-27 VITALS — BP 120/70 | HR 60 | Ht 62.0 in | Wt 164.4 lb

## 2011-10-27 DIAGNOSIS — K3189 Other diseases of stomach and duodenum: Secondary | ICD-10-CM

## 2011-10-27 DIAGNOSIS — R1013 Epigastric pain: Secondary | ICD-10-CM

## 2011-10-27 MED ORDER — FAMOTIDINE 10 MG PO TABS: 10.0000 mg | ORAL_TABLET | Freq: Two times a day (BID) | ORAL | Status: DC

## 2011-10-27 NOTE — Progress Notes (Signed)
Review of gastrointestinal problems:  1. Chronic abdominal pain. Diagnosed as adhesive disease in the past by Dr. Victorino Dike. CT scan September 2008 showed ventral hiatal hernia was stable. No acute findings in the abdomen or pelvis. January, 2010: repeat pains, went to emergency room, complete metabolic profile, amylase, lipase were normal. CT Scan January, 2010 with IV and oral contrast of abdomen and pelvis was normal. CT Scan December, 2010 done by ER, essentially normal. EGD January 2011: Mild gastritis, duodenitis. Biopsies showed no H. pylori.  2. Chronic gastroesophageal reflux disease. Esophagogastroduodenoscopy July 2002 showed no Barrett's esophagus. EGD October 2008 was normal, except for a 2 cm hiatal hernia.  3. History of colon polyps,colonoscopy October 2004 by Dr. Victorino Dike showed no colon polyps. He wrote the indication for that examination was adenomatous polyps. I do not see any pathology reports in our chart indicating adenomatous polyps. Repeat colonoscopy 01/2008 by [facility] was normal to terminal ileum except for small hemorrhoids.  4. Status post remote cholecystectomy  5. Nausea: Winter, 2010; EGD January 2011, see above    HPI: This is a  pleasant 78 year old woman who is here with her usual chronic GI complaints. That is varying degrees of epigastric, abdominal discomfort, GERD-like symptoms. At her last visit she told me pretty clearly that H2 blocker helped her symptoms better than proton pump inhibitor. Today she is once again on proton pump inhibitor and not taking H2 blocker.  She still has nightly epigastric pains "popping in stomach."  Sometimes into her belly.  Cbc 3 months ago shows she is not anemic  Weight stable over 8 months (our scale).  No overt bleeding.     Past Medical History  Diagnosis Date  . Irritable bowel syndrome   . Diverticulosis of colon (without mention of hemorrhage)   . Other and unspecified hyperlipidemia   . Unspecified essential  hypertension   . Diaphragmatic hernia without mention of obstruction or gangrene   . Esophageal reflux   . Ventral hernia, unspecified, without mention of obstruction or gangrene     Past Surgical History  Procedure Date  . Hiatal hernia repair   . Cholecystectomy     Current Outpatient Prescriptions  Medication Sig Dispense Refill  . alum & mag hydroxide-simeth (MYLANTA) 200-200-20 MG/5ML suspension Take 5 mLs by mouth daily as needed. indigestion      . aspirin 81 MG tablet Take 81 mg by mouth daily.        . clonazePAM (KLONOPIN) 0.5 MG tablet Take 0.5 mg by mouth 2 (two) times daily as needed.      . colesevelam (WELCHOL) 625 MG tablet Take 625 mg by mouth 2 (two) times daily with a meal.       . dicyclomine (BENTYL) 20 MG tablet Take 20 mg by mouth every 6 (six) hours as needed. Stomach spasms      . esomeprazole (NEXIUM) 40 MG capsule Take 1 capsule (40 mg total) by mouth daily.  30 capsule  0  . Multiple Vitamins-Minerals (CENTRUM SILVER PO) Take by mouth. 1 daily        . olmesartan (BENICAR) 40 MG tablet Take 40 mg by mouth. Take one pill every Tues. Thurs. And Sat.      . olmesartan-hydrochlorothiazide (BENICAR HCT) 40-12.5 MG per tablet Take 1 tablet by mouth. Take one pill mon, wed. , fri. sun      . promethazine (PHENERGAN) 25 MG tablet Take 25 mg by mouth 2 (two) times daily. nausea  Allergies as of 10/27/2011 - Review Complete 10/27/2011  Allergen Reaction Noted  . Pro-flora concentrate  11/09/2007    Family History  Problem Relation Age of Onset  . Colon cancer Neg Hx   . Breast cancer Daughter 32    History   Social History  . Marital Status: Widowed    Spouse Name: N/A    Number of Children: 4  . Years of Education: N/A   Occupational History  . retired    Social History Main Topics  . Smoking status: Never Smoker   . Smokeless tobacco: Never Used  . Alcohol Use: No  . Drug Use: No  . Sexually Active: Not on file   Other Topics Concern    . Not on file   Social History Narrative  . No narrative on file      Physical Exam: BP 120/70  Pulse 60  Ht 5\' 2"  (1.575 m)  Wt 164 lb 6.4 oz (74.571 kg)  BMI 30.07 kg/m2 Constitutional: generally well-appearing Psychiatric: alert and oriented x3 Abdomen: soft, nontender, nondistended, no obvious ascites, no peritoneal signs, normal bowel sounds     Assessment and plan: 78 y.o. female with chronic upper GI complaints without any alarm symptoms  I reminded her that she had told me previously that H2 blockers seem to help her symptoms better. I once again removed Nexium from her medication list and represcribed her Pepcid with many refills.

## 2011-10-27 NOTE — Patient Instructions (Addendum)
Nexium stopped (again). Pepcid called in. Take one pill twice daily. Call with any further questions or concerns.

## 2011-11-04 ENCOUNTER — Telehealth: Payer: Self-pay | Admitting: Gastroenterology

## 2011-11-05 NOTE — Telephone Encounter (Signed)
Pt has been using pepcid as directed but has become constipated she has been using miralax 1 capful daily.  I advised her to increase to 2 capfuls twice daily until she has a good bowel movement and then decrease to daily.  She will call back if she has no response

## 2011-11-11 ENCOUNTER — Other Ambulatory Visit (HOSPITAL_COMMUNITY): Payer: Self-pay | Admitting: Pulmonary Disease

## 2011-11-11 DIAGNOSIS — R1013 Epigastric pain: Secondary | ICD-10-CM

## 2011-11-14 ENCOUNTER — Ambulatory Visit (HOSPITAL_COMMUNITY)
Admission: RE | Admit: 2011-11-14 | Discharge: 2011-11-14 | Disposition: A | Discharge status: Home / Self Care | Payer: Medicare Other | Source: Ambulatory Visit | Attending: Pulmonary Disease | Admitting: Pulmonary Disease

## 2011-11-14 DIAGNOSIS — R1013 Epigastric pain: Secondary | ICD-10-CM | POA: Insufficient documentation

## 2011-11-18 ENCOUNTER — Encounter: Payer: Self-pay | Admitting: Vascular Surgery

## 2011-11-18 ENCOUNTER — Other Ambulatory Visit (HOSPITAL_COMMUNITY): Payer: Medicare Other

## 2011-11-19 ENCOUNTER — Other Ambulatory Visit (HOSPITAL_COMMUNITY): Payer: Self-pay | Admitting: Pulmonary Disease

## 2011-11-19 DIAGNOSIS — I700 Atherosclerosis of aorta: Secondary | ICD-10-CM

## 2011-11-19 DIAGNOSIS — R109 Unspecified abdominal pain: Secondary | ICD-10-CM

## 2011-11-21 ENCOUNTER — Ambulatory Visit (HOSPITAL_COMMUNITY)
Admission: RE | Admit: 2011-11-21 | Discharge: 2011-11-21 | Disposition: A | Discharge status: Home / Self Care | Payer: Medicare Other | Source: Ambulatory Visit | Attending: Pulmonary Disease | Admitting: Pulmonary Disease

## 2011-11-21 DIAGNOSIS — K449 Diaphragmatic hernia without obstruction or gangrene: Secondary | ICD-10-CM | POA: Insufficient documentation

## 2011-11-21 DIAGNOSIS — K439 Ventral hernia without obstruction or gangrene: Secondary | ICD-10-CM | POA: Insufficient documentation

## 2011-11-21 DIAGNOSIS — K5730 Diverticulosis of large intestine without perforation or abscess without bleeding: Secondary | ICD-10-CM | POA: Insufficient documentation

## 2011-11-21 DIAGNOSIS — I700 Atherosclerosis of aorta: Secondary | ICD-10-CM

## 2011-11-21 DIAGNOSIS — R110 Nausea: Secondary | ICD-10-CM | POA: Insufficient documentation

## 2011-11-21 DIAGNOSIS — Z9089 Acquired absence of other organs: Secondary | ICD-10-CM | POA: Insufficient documentation

## 2011-11-21 DIAGNOSIS — R109 Unspecified abdominal pain: Secondary | ICD-10-CM | POA: Insufficient documentation

## 2011-11-21 DIAGNOSIS — N281 Cyst of kidney, acquired: Secondary | ICD-10-CM | POA: Insufficient documentation

## 2011-11-21 MED ORDER — IOHEXOL 350 MG/ML SOLN
100.0000 mL | Freq: Once | INTRAVENOUS | Status: AC | PRN
  Administered 2011-11-21: 100 mL via INTRAVENOUS

## 2011-12-16 ENCOUNTER — Encounter: Payer: Medicare Other | Admitting: Vascular Surgery

## 2012-02-16 ENCOUNTER — Telehealth: Payer: Self-pay | Admitting: Gastroenterology

## 2012-02-16 MED ORDER — FAMOTIDINE 10 MG PO TABS: 10.0000 mg | ORAL_TABLET | Freq: Two times a day (BID) | ORAL | Status: DC

## 2012-02-16 NOTE — Telephone Encounter (Signed)
Pt rx has been sent to the pharmacy

## 2012-02-17 ENCOUNTER — Other Ambulatory Visit: Payer: Self-pay | Admitting: Gastroenterology

## 2012-02-17 MED ORDER — FAMOTIDINE 20 MG PO TABS: 20.0000 mg | ORAL_TABLET | Freq: Two times a day (BID) | ORAL | Status: DC

## 2012-02-17 NOTE — Telephone Encounter (Signed)
Medication corrected and resent to the pharmacy.

## 2012-03-30 ENCOUNTER — Telehealth: Payer: Self-pay | Admitting: Gastroenterology

## 2012-03-31 NOTE — Telephone Encounter (Signed)
Pt took Bentyl and her pains got better she will continue her pepcid and call back on Monday if she is no better

## 2012-04-01 ENCOUNTER — Emergency Department (HOSPITAL_COMMUNITY): Payer: Medicare Other

## 2012-04-01 ENCOUNTER — Encounter (HOSPITAL_COMMUNITY): Payer: Self-pay

## 2012-04-01 ENCOUNTER — Emergency Department (HOSPITAL_COMMUNITY)
Admission: EM | Admit: 2012-04-01 | Discharge: 2012-04-01 | Disposition: A | Discharge status: Home / Self Care | Payer: Medicare Other | Attending: Emergency Medicine | Admitting: Emergency Medicine

## 2012-04-01 DIAGNOSIS — I10 Essential (primary) hypertension: Secondary | ICD-10-CM | POA: Insufficient documentation

## 2012-04-01 DIAGNOSIS — R109 Unspecified abdominal pain: Secondary | ICD-10-CM

## 2012-04-01 DIAGNOSIS — Z79899 Other long term (current) drug therapy: Secondary | ICD-10-CM | POA: Insufficient documentation

## 2012-04-01 DIAGNOSIS — K589 Irritable bowel syndrome without diarrhea: Secondary | ICD-10-CM | POA: Insufficient documentation

## 2012-04-01 DIAGNOSIS — K2970 Gastritis, unspecified, without bleeding: Secondary | ICD-10-CM | POA: Insufficient documentation

## 2012-04-01 DIAGNOSIS — R110 Nausea: Secondary | ICD-10-CM | POA: Insufficient documentation

## 2012-04-01 DIAGNOSIS — Z8719 Personal history of other diseases of the digestive system: Secondary | ICD-10-CM | POA: Insufficient documentation

## 2012-04-01 DIAGNOSIS — K219 Gastro-esophageal reflux disease without esophagitis: Secondary | ICD-10-CM | POA: Insufficient documentation

## 2012-04-01 DIAGNOSIS — E785 Hyperlipidemia, unspecified: Secondary | ICD-10-CM | POA: Insufficient documentation

## 2012-04-01 LAB — CBC WITH DIFFERENTIAL/PLATELET
Eosinophils Absolute: 0.1 10*3/uL (ref 0.0–0.7)
Eosinophils Relative: 3 % (ref 0–5)
Lymphs Abs: 1 10*3/uL (ref 0.7–4.0)
MCH: 29.1 pg (ref 26.0–34.0)
MCHC: 33.8 g/dL (ref 30.0–36.0)
MCV: 86.1 fL (ref 78.0–100.0)
Monocytes Relative: 8 % (ref 3–12)
Platelets: 182 10*3/uL (ref 150–400)
RBC: 4.47 MIL/uL (ref 3.87–5.11)

## 2012-04-01 LAB — POCT I-STAT TROPONIN I: Troponin i, poc: 0 ng/mL (ref 0.00–0.08)

## 2012-04-01 LAB — LIPASE, BLOOD: Lipase: 15 U/L (ref 11–59)

## 2012-04-01 LAB — COMPREHENSIVE METABOLIC PANEL
BUN: 14 mg/dL (ref 6–23)
Calcium: 9.1 mg/dL (ref 8.4–10.5)
GFR calc Af Amer: 64 mL/min — ABNORMAL LOW (ref >90)
Glucose, Bld: 106 mg/dL — ABNORMAL HIGH (ref 70–99)
Sodium: 137 mEq/L (ref 135–145)
Total Protein: 6.9 g/dL (ref 6.0–8.3)

## 2012-04-01 LAB — URINALYSIS, ROUTINE W REFLEX MICROSCOPIC
Nitrite: NEGATIVE
Specific Gravity, Urine: 1.018 (ref 1.005–1.030)
Urobilinogen, UA: 0.2 mg/dL (ref 0.0–1.0)

## 2012-04-01 MED ORDER — SUCRALFATE 1 G PO TABS
1.0000 g | ORAL_TABLET | Freq: Once | ORAL | Status: AC
  Administered 2012-04-01: 1 g via ORAL
  Filled 2012-04-01 (×2): qty 1

## 2012-04-01 MED ORDER — PANTOPRAZOLE SODIUM 20 MG PO TBEC: 20.0000 mg | DELAYED_RELEASE_TABLET | Freq: Every day | ORAL | Status: DC

## 2012-04-01 MED ORDER — PANTOPRAZOLE SODIUM 40 MG PO TBEC
40.0000 mg | DELAYED_RELEASE_TABLET | Freq: Once | ORAL | Status: AC
  Administered 2012-04-01: 40 mg via ORAL
  Filled 2012-04-01: qty 1

## 2012-04-01 MED ORDER — GI COCKTAIL ~~LOC~~
30.0000 mL | Freq: Once | ORAL | Status: AC
  Administered 2012-04-01: 30 mL via ORAL
  Filled 2012-04-01: qty 30

## 2012-04-01 MED ORDER — IOHEXOL 300 MG/ML  SOLN
100.0000 mL | Freq: Once | INTRAMUSCULAR | Status: AC | PRN
  Administered 2012-04-01: 100 mL via INTRAVENOUS

## 2012-04-01 MED ORDER — SUCRALFATE 1 G PO TABS: 1.0000 g | ORAL_TABLET | Freq: Four times a day (QID) | ORAL | Status: DC

## 2012-04-01 NOTE — ED Provider Notes (Signed)
History     CSN: [id number]  Arrival date & time 04/01/12  1342   First MD Initiated Contact with Patient 04/01/12 1401      Chief Complaint  Patient presents with  . Abdominal Pain    (Consider location/radiation/quality/duration/timing/severity/associated sxs/prior treatment) HPI Ann Riddle is a 78 y.o. female who presents to ED with complaint of upper abdominal pain, intermittently for two weeks. States pain is mainly at night and after eating. States symptoms began after she took some ginger ale and a laxative to help her have a bowel movement 2 wks ago. Sates has not been eating much lately because afraid it is going to hurt. Pain is in the upper abdomen mainly but radiates "all over abdomen and chest." States associated with nausea. Denies fever, chills, vomiting. States last night, had more pain that before, took mylanta, pepcid, with relief eventually. States she has not seen her doctor yet, but called them, and they said just to continue taking pepcid. Pt denies any urinary symptoms or problems with bowels. States last normal bowel movement yesterday.  Past Medical History  Diagnosis Date  . Irritable bowel syndrome   . Diverticulosis of colon (without mention of hemorrhage)   . Other and unspecified hyperlipidemia   . Unspecified essential hypertension   . Diaphragmatic hernia without mention of obstruction or gangrene   . Esophageal reflux   . Ventral hernia, unspecified, without mention of obstruction or gangrene     Past Surgical History  Procedure Date  . Hiatal hernia repair   . Cholecystectomy     Family History  Problem Relation Age of Onset  . Colon cancer Neg Hx   . Breast cancer Daughter 66    History  Substance Use Topics  . Smoking status: Never Smoker   . Smokeless tobacco: Never Used  . Alcohol Use: No    OB History    Grav Para Term Preterm Abortions TAB SAB Ect Mult Living                  Review of Systems  Constitutional:  Positive for appetite change. Negative for fever and chills.  Gastrointestinal: Positive for nausea and abdominal pain. Negative for vomiting, diarrhea, constipation and blood in stool.  Genitourinary: Negative for dysuria.  All other systems reviewed and are negative.    Allergies  Pro-flora concentrate  Home Medications   Current Outpatient Rx  Name  Route  Sig  Dispense  Refill  . ACETAMINOPHEN 500 MG PO TABS   Oral   Take 500 mg by mouth every 6 (six) hours as needed. Pain         . ALUM & MAG HYDROXIDE-SIMETH 200-200-20 MG/5ML PO SUSP   Oral   Take 10 mLs by mouth daily as needed. indigestion         . ASPIRIN 81 MG PO TABS   Oral   Take 81 mg by mouth daily.           Marland Kitchen CLONAZEPAM 0.5 MG PO TABS   Oral   Take 0.5 mg by mouth 2 (two) times daily as needed.         . CLOPIDOGREL BISULFATE 75 MG PO TABS   Oral   Take 75 mg by mouth daily.         . COLESEVELAM HCL 625 MG PO TABS   Oral   Take 625 mg by mouth 2 (two) times daily with a meal.          .  DICYCLOMINE HCL 20 MG PO TABS   Oral   Take 20 mg by mouth every 6 (six) hours as needed. Stomach spasms         . FAMOTIDINE 10 MG PO TABS   Oral   Take 1 tablet (10 mg total) by mouth 2 (two) times daily.   60 tablet   11   . FAMOTIDINE 20 MG PO TABS   Oral   Take 20 mg by mouth 2 (two) times daily.         . CENTRUM SILVER PO   Oral   Take by mouth. 1 daily           . OLMESARTAN MEDOXOMIL 40 MG PO TABS   Oral   Take 40 mg by mouth. Take one pill every Tues. Thurs. And Sat.         . OLMESARTAN MEDOXOMIL-HCTZ 40-12.5 MG PO TABS   Oral   Take 1 tablet by mouth. Take one pill mon, wed. , fri. sun         . PROMETHAZINE HCL 25 MG PO TABS   Oral   Take 25 mg by mouth 2 (two) times daily. nausea           BP 173/113  Pulse 73  Temp 98.4 F (36.9 C) (Oral)  Resp 23  SpO2 100%  Physical Exam  Nursing note and vitals reviewed. Constitutional: She is oriented to person,  place, and time. She appears well-developed and well-nourished. No distress.  HENT:  Head: Normocephalic.  Eyes: Conjunctivae normal are normal.  Cardiovascular: Normal rate and regular rhythm.   Murmur heard. Pulmonary/Chest: Effort normal and breath sounds normal. No respiratory distress. She has no wheezes. She has no rales.  Abdominal: Soft. Bowel sounds are normal. She exhibits no distension. There is tenderness. There is no rebound and no guarding.       Epigastric tenderness  Musculoskeletal: She exhibits no edema.  Neurological: She is alert and oriented to person, place, and time.  Skin: Skin is warm and dry.  Psychiatric: She has a normal mood and affect. Her behavior is normal.    ED Course  Procedures (including critical care time)  Pt with epigastric pain radiating into her chest last night and every night for the last 2 wks. Pt currently symptom free. Exam shows mild epigastric tenderness. Will check labs, ecg, acute abd series   Date: 04/01/2012  Rate: 75  Rhythm: normal sinus rhythm  QRS Axis: normal  Intervals: normal  ST/T Wave abnormalities: nonspecific T wave changes  Conduction Disutrbances:none  Narrative Interpretation: early precordial r/s transition  Old EKG Reviewed: none available Results for orders placed during the hospital encounter of 04/01/12  CBC WITH DIFFERENTIAL      Component Value Range   WBC 3.6 (*) 4.0 - 10.5 K/uL   RBC 4.47  3.87 - 5.11 MIL/uL   Hemoglobin 13.0  12.0 - 15.0 g/dL   HCT 16.1  09.6 - 04.5 %   MCV 86.1  78.0 - 100.0 fL   MCH 29.1  26.0 - 34.0 pg   MCHC 33.8  30.0 - 36.0 g/dL   RDW 40.9  81.1 - 91.4 %   Platelets 182  150 - 400 K/uL   Neutrophils Relative 62  43 - 77 %   Neutro Abs 2.2  1.7 - 7.7 K/uL   Lymphocytes Relative 28  12 - 46 %   Lymphs Abs 1.0  0.7 - 4.0 K/uL   Monocytes  Relative 8  3 - 12 %   Monocytes Absolute 0.3  0.1 - 1.0 K/uL   Eosinophils Relative 3  0 - 5 %   Eosinophils Absolute 0.1  0.0 - 0.7 K/uL    Basophils Relative 0  0 - 1 %   Basophils Absolute 0.0  0.0 - 0.1 K/uL  COMPREHENSIVE METABOLIC PANEL      Component Value Range   Sodium 137  135 - 145 mEq/L   Potassium 3.5  3.5 - 5.1 mEq/L   Chloride 100  96 - 112 mEq/L   CO2 28  19 - 32 mEq/L   Glucose, Bld 106 (*) 70 - 99 mg/dL   BUN 14  6 - 23 mg/dL   Creatinine, Ser 1.61  0.50 - 1.10 mg/dL   Calcium 9.1  8.4 - 09.6 mg/dL   Total Protein 6.9  6.0 - 8.3 g/dL   Albumin 3.5  3.5 - 5.2 g/dL   AST 20  0 - 37 U/L   ALT 17  0 - 35 U/L   Alkaline Phosphatase 82  39 - 117 U/L   Total Bilirubin 0.4  0.3 - 1.2 mg/dL   GFR calc non Af Amer 55 (*) >90 mL/min   GFR calc Af Amer 64 (*) >90 mL/min  LIPASE, BLOOD      Component Value Range   Lipase 15  11 - 59 U/L  URINALYSIS, ROUTINE W REFLEX MICROSCOPIC      Component Value Range   Color, Urine YELLOW  YELLOW   APPearance CLEAR  CLEAR   Specific Gravity, Urine 1.018  1.005 - 1.030   pH 6.5  5.0 - 8.0   Glucose, UA NEGATIVE  NEGATIVE mg/dL   Hgb urine dipstick SMALL (*) NEGATIVE   Bilirubin Urine NEGATIVE  NEGATIVE   Ketones, ur NEGATIVE  NEGATIVE mg/dL   Protein, ur NEGATIVE  NEGATIVE mg/dL   Urobilinogen, UA 0.2  0.0 - 1.0 mg/dL   Nitrite NEGATIVE  NEGATIVE   Leukocytes, UA LARGE (*) NEGATIVE  POCT I-STAT TROPONIN I      Component Value Range   Troponin i, poc 0.00  0.00 - 0.08 ng/mL   Comment 3           URINE MICROSCOPIC-ADD ON      Component Value Range   Squamous Epithelial / LPF RARE  RARE   WBC, UA 3-6  <3 WBC/hpf   RBC / HPF 0-2  <3 RBC/hpf     Dg Abd Acute W/chest  04/01/2012  *RADIOLOGY REPORT*  Clinical Data: 78 year old female with abdominal and chest pain.  ACUTE ABDOMEN SERIES (ABDOMEN 2 VIEW & CHEST 1 VIEW)  Comparison: 07/17/2011 chest radiograph and 12/16/2010 acute abdominal series  Findings: The cardiomediastinal silhouette is unremarkable. There is no evidence of airspace disease, pleural effusion or pneumothorax.  Scattered nondistended gas-filled  loops of small bowel are present. There is no evidence of bowel obstruction or pneumoperitoneum.  Cholecystectomy clips are present. No suspicious calcifications are identified. No acute bony abnormalities are noted.  IMPRESSION: Nonspecific nonobstructive bowel gas pattern - no evidence of pneumoperitoneum.  No evidence of active cardiopulmonary disease.   Original Report Authenticated By: Harmon Pier, M.D.     Pt started having pain while in ED. Given GI coctail, protonix. Abdominal exam reassessed, tender diffusely. Will get CT.     Ct Abdomen Pelvis W Contrast  04/01/2012  *RADIOLOGY REPORT*  Clinical Data: Right-sided abdominal pain and nausea  CT ABDOMEN AND PELVIS  WITH CONTRAST  Technique:  Multidetector CT imaging of the abdomen and pelvis was performed following the standard protocol during bolus administration of intravenous contrast.  Contrast: OMNIPAQUE IOHEXOL 300 MG/ML  SOLN  Comparison: 11/21/2011 CT  Findings: Lung bases are clear.  Bilateral renal cortical too small to characterize hypodense lesions and dominant left lower renal pole cyst again noted, stable. Cholecystectomy clips noted. Supraumbilical fat-containing ventral hernia measuring 2.7 cm in width image 32.  Liver, adrenal glands, spleen, and pancreas are normal.  Uterus and ovaries are normal.  Appendix is normal.  No bowel wall thickening or focal segmental dilatation.  No free air or fluid. No lymphadenopathy.  Degenerative changes are noted in the spine. No acute osseous abnormality.  IMPRESSION: No acute intra-abdominal or pelvic pathology.   Original Report Authenticated By: Christiana Pellant, M.D.     1. Gastritis   2. Abdominal pain       MDM  Pt with upper abdominal pain/epigastric tenderness. Pt with hx of gastric reflux. She has taken mylanta and pepcid at home. Pt was at first pain free but developed pain while in ED. Given GI cocktail protonix with some relief. CT negative. Will d/c home. Will add carafate  and PPI. Follow up closely with PCP.  Filed Vitals:   04/01/12 1347 04/01/12 1416 04/01/12 1536  BP: 139/81 173/113 132/60  Pulse: 86 73 67  Temp: 98 F (36.7 C) 98.4 F (36.9 C)   TempSrc: Oral Oral   Resp: 20 23   SpO2: 99% 100% 100%         Lottie Mussel, PA 04/06/12 0110

## 2012-04-01 NOTE — ED Notes (Signed)
Patient is resting comfortably. 

## 2012-04-01 NOTE — ED Notes (Addendum)
Patient reports that she has mid abdominal pain and nauseathat began last night. Patient denies V/D and or fever. Patient reports that she took her acid reflux medication, Mylanta, and Tylenol with no relief.

## 2012-04-01 NOTE — ED Notes (Signed)
Pharmacy sent carafate in suspension instead of tablet. Informed them.

## 2012-04-02 LAB — URINE CULTURE: Colony Count: 9000

## 2012-04-06 ENCOUNTER — Emergency Department (HOSPITAL_COMMUNITY)
Admission: EM | Admit: 2012-04-06 | Discharge: 2012-04-06 | Disposition: A | Discharge status: Home / Self Care | Payer: Medicare Other | Attending: Emergency Medicine | Admitting: Emergency Medicine

## 2012-04-06 ENCOUNTER — Emergency Department (HOSPITAL_COMMUNITY): Payer: Medicare Other

## 2012-04-06 ENCOUNTER — Encounter (HOSPITAL_COMMUNITY): Payer: Self-pay | Admitting: Emergency Medicine

## 2012-04-06 DIAGNOSIS — K219 Gastro-esophageal reflux disease without esophagitis: Secondary | ICD-10-CM | POA: Insufficient documentation

## 2012-04-06 DIAGNOSIS — I10 Essential (primary) hypertension: Secondary | ICD-10-CM | POA: Insufficient documentation

## 2012-04-06 DIAGNOSIS — R12 Heartburn: Secondary | ICD-10-CM | POA: Insufficient documentation

## 2012-04-06 DIAGNOSIS — E785 Hyperlipidemia, unspecified: Secondary | ICD-10-CM | POA: Insufficient documentation

## 2012-04-06 DIAGNOSIS — R110 Nausea: Secondary | ICD-10-CM | POA: Insufficient documentation

## 2012-04-06 DIAGNOSIS — Z79899 Other long term (current) drug therapy: Secondary | ICD-10-CM | POA: Insufficient documentation

## 2012-04-06 DIAGNOSIS — K589 Irritable bowel syndrome without diarrhea: Secondary | ICD-10-CM | POA: Insufficient documentation

## 2012-04-06 DIAGNOSIS — Z8719 Personal history of other diseases of the digestive system: Secondary | ICD-10-CM | POA: Insufficient documentation

## 2012-04-06 DIAGNOSIS — R109 Unspecified abdominal pain: Secondary | ICD-10-CM | POA: Insufficient documentation

## 2012-04-06 DIAGNOSIS — Z7982 Long term (current) use of aspirin: Secondary | ICD-10-CM | POA: Insufficient documentation

## 2012-04-06 LAB — URINE MICROSCOPIC-ADD ON

## 2012-04-06 LAB — URINALYSIS, ROUTINE W REFLEX MICROSCOPIC
Bilirubin Urine: NEGATIVE
Glucose, UA: NEGATIVE mg/dL
Ketones, ur: 15 mg/dL — AB
pH: 6 (ref 5.0–8.0)

## 2012-04-06 LAB — LIPASE, BLOOD: Lipase: 17 U/L (ref 11–59)

## 2012-04-06 LAB — BASIC METABOLIC PANEL
GFR calc Af Amer: 63 mL/min — ABNORMAL LOW (ref >90)
GFR calc non Af Amer: 54 mL/min — ABNORMAL LOW (ref >90)
Glucose, Bld: 105 mg/dL — ABNORMAL HIGH (ref 70–99)
Potassium: 3.4 mEq/L — ABNORMAL LOW (ref 3.5–5.1)
Sodium: 135 mEq/L (ref 135–145)

## 2012-04-06 LAB — CBC WITH DIFFERENTIAL/PLATELET
Basophils Absolute: 0 10*3/uL (ref 0.0–0.1)
Eosinophils Absolute: 0.1 10*3/uL (ref 0.0–0.7)
Lymphs Abs: 1 10*3/uL (ref 0.7–4.0)
MCH: 29.4 pg (ref 26.0–34.0)
Neutrophils Relative %: 62 % (ref 43–77)
Platelets: 169 10*3/uL (ref 150–400)
RBC: 4.66 MIL/uL (ref 3.87–5.11)
WBC: 3.4 10*3/uL — ABNORMAL LOW (ref 4.0–10.5)

## 2012-04-06 MED ORDER — ONDANSETRON 4 MG PO TBDP
4.0000 mg | ORAL_TABLET | Freq: Once | ORAL | Status: AC
  Administered 2012-04-06: 4 mg via ORAL

## 2012-04-06 MED ORDER — ONDANSETRON 4 MG PO TBDP
ORAL_TABLET | ORAL | Status: AC
  Filled 2012-04-06: qty 2

## 2012-04-06 MED ORDER — GI COCKTAIL ~~LOC~~
30.0000 mL | Freq: Once | ORAL | Status: AC
Start: 2012-04-06 — End: 2012-04-06
  Administered 2012-04-06: 30 mL via ORAL
  Filled 2012-04-06: qty 30

## 2012-04-06 NOTE — ED Notes (Signed)
G4 I-stat lactic acid results handed to Dr.Wickline

## 2012-04-06 NOTE — ED Notes (Signed)
Pt c/o upper abd pain into chest that feels like her acid reflux; pt seen at [facility] for same last week

## 2012-04-06 NOTE — ED Provider Notes (Signed)
History     CSN: [id number]  Arrival date & time 04/06/12  1221   First MD Initiated Contact with Patient 04/06/12 1633      Chief Complaint  Patient presents with  . Abdominal Pain     Patient is a 78 y.o. female presenting with abdominal pain. The history is provided by the patient.  Abdominal Pain The primary symptoms of the illness include abdominal pain and nausea. The primary symptoms of the illness do not include fever, fatigue, shortness of breath, vomiting, diarrhea, hematemesis, hematochezia, dysuria or vaginal bleeding. The current episode started more than 2 days ago. The onset of the illness was gradual. The problem has been gradually worsening.  The patient has not had a change in bowel habit. Additional symptoms associated with the illness include heartburn. Symptoms associated with the illness do not include chills or constipation.  pt reports abd pain for "awhile" but specifically reports pain for at least 1 month every day.  It is epigastric and at times is sharp and burning.  No vomiting.  No CP/SOB.  No focal weakness.  No dysuria/fever She has had this before, thinks it is acid reflux  Past Medical History  Diagnosis Date  . Irritable bowel syndrome   . Diverticulosis of colon (without mention of hemorrhage)   . Other and unspecified hyperlipidemia   . Unspecified essential hypertension   . Diaphragmatic hernia without mention of obstruction or gangrene   . Esophageal reflux   . Ventral hernia, unspecified, without mention of obstruction or gangrene     Past Surgical History  Procedure Date  . Hiatal hernia repair   . Cholecystectomy     Family History  Problem Relation Age of Onset  . Colon cancer Neg Hx   . Breast cancer Daughter 55    History  Substance Use Topics  . Smoking status: Never Smoker   . Smokeless tobacco: Never Used  . Alcohol Use: No    OB History    Grav Para Term Preterm Abortions TAB SAB Ect Mult Living                   Review of Systems  Constitutional: Negative for fever, chills and fatigue.  Respiratory: Negative for shortness of breath.   Gastrointestinal: Positive for heartburn, nausea and abdominal pain. Negative for vomiting, diarrhea, constipation, hematochezia and hematemesis.  Genitourinary: Negative for dysuria and vaginal bleeding.  Skin: Negative for color change.  Neurological: Negative for weakness.  Psychiatric/Behavioral: Negative for agitation.  All other systems reviewed and are negative.    Allergies  Pro-flora concentrate  Home Medications   Current Outpatient Rx  Name  Route  Sig  Dispense  Refill  . ACETAMINOPHEN 500 MG PO TABS   Oral   Take 500 mg by mouth every 6 (six) hours as needed. Pain         . ALUM & MAG HYDROXIDE-SIMETH 200-200-20 MG/5ML PO SUSP   Oral   Take 10 mLs by mouth daily as needed. indigestion         . ASPIRIN 81 MG PO TABS   Oral   Take 81 mg by mouth daily.           Marland Kitchen CLONAZEPAM 0.5 MG PO TABS   Oral   Take 0.5 mg by mouth 2 (two) times daily as needed.         . CLOPIDOGREL BISULFATE 75 MG PO TABS   Oral   Take 75 mg by mouth  daily.         . COLESEVELAM HCL 625 MG PO TABS   Oral   Take 625 mg by mouth 2 (two) times daily with a meal.          . DICYCLOMINE HCL 20 MG PO TABS   Oral   Take 20 mg by mouth every 6 (six) hours as needed. Stomach spasms         . FAMOTIDINE 10 MG PO TABS   Oral   Take 1 tablet (10 mg total) by mouth 2 (two) times daily.   60 tablet   11   . FAMOTIDINE 20 MG PO TABS   Oral   Take 20 mg by mouth 2 (two) times daily.         . CENTRUM SILVER PO   Oral   Take by mouth. 1 daily           . OLMESARTAN MEDOXOMIL 40 MG PO TABS   Oral   Take 40 mg by mouth. Take one pill every Tues. Thurs. And Sat.         . OLMESARTAN MEDOXOMIL-HCTZ 40-12.5 MG PO TABS   Oral   Take 1 tablet by mouth. Take one pill mon, wed. , fri. sun         . PANTOPRAZOLE SODIUM 20 MG PO TBEC    Oral   Take 1 tablet (20 mg total) by mouth daily.   30 tablet   0   . PROMETHAZINE HCL 25 MG PO TABS   Oral   Take 25 mg by mouth 2 (two) times daily. nausea         . SUCRALFATE 1 G PO TABS   Oral   Take 1 tablet (1 g total) by mouth 4 (four) times daily.   60 tablet   0     BP 147/64  Pulse 90  Temp 97.8 F (36.6 C) (Oral)  Resp 18  SpO2 100%  Physical Exam CONSTITUTIONAL: Well developed/well nourished HEAD AND FACE: Normocephalic/atraumatic EYES: EOMI/PERRL ENMT: Mucous membranes moist NECK: supple no meningeal signs SPINE:entire spine nontender CV: S1/S2 noted, no murmurs/rubs/gallops noted LUNGS: Lungs are clear to auscultation bilaterally, no apparent distress ABDOMEN: soft, nontender, no rebound or guarding, +Bs Rectal - no blood noted in stool, no melena, no rectal mass, hem negative, chaperone present GU:no cva tenderness NEURO: Pt is awake/alert, moves all extremitiesx4 EXTREMITIES: pulses normal, full ROM SKIN: warm, color normal PSYCH: no abnormalities of mood noted  ED Course  Procedures   Labs Reviewed  CBC WITH DIFFERENTIAL - Abnormal; Notable for the following:    WBC 3.4 (*)     All other components within normal limits  BASIC METABOLIC PANEL - Abnormal; Notable for the following:    Potassium 3.4 (*)     Glucose, Bld 105 (*)     GFR calc non Af Amer 54 (*)     GFR calc Af Amer 63 (*)     All other components within normal limits  LIPASE, BLOOD  POCT I-STAT TROPONIN I   Dg Chest 2 View  04/06/2012  *RADIOLOGY REPORT*  Clinical Data: Chest and upper abdominal pain, shortness of breath, symptoms 3 months duration, history hypertension, palpitations, GERD  CHEST - 2 VIEW  Comparison: 04/01/2012  Findings: Minimal enlargement of cardiac silhouette. Tortuous aorta. Pulmonary vascularity normal. Lungs clear. No pleural effusion or pneumothorax. Diffuse osseous demineralization. Surgical clips right upper quadrant question cholecystectomy.   IMPRESSION: Minimal enlargement of cardiac  silhouette. No acute abnormalities.   Original Report Authenticated By: Ulyses Southward, M.D.    5:46 PM Pt well appearing, reports abd pain x1 month, exam is benign.  Labs unchanged/reassuring.  Had recent CT abd that was negative.  Previous to that had ct angio abd/pelvis that did not demonstrate any vascular obstruction in abdomen.  Also had EGD 2011 (dr Christella Hartigan) that showed gastritis  7:28 PM Pt stable in the Ed Urine culture sent but no dysuria reported She can f/u with her GI specialist   MDM  Nursing notes including past medical history and social history reviewed and considered in documentation xrays reviewed and considered Labs/vital reviewed and considered Previous records reviewed and considered        Date: 04/06/2012  Rate: 79  Rhythm: normal sinus rhythm  QRS Axis: normal  Intervals: normal  ST/T Wave abnormalities: normal  Conduction Disutrbances:none     Joya Gaskins, MD 04/06/12 1928

## 2012-04-06 NOTE — ED Notes (Signed)
Pt did not answer when name calledx2 

## 2012-04-08 ENCOUNTER — Telehealth: Payer: Self-pay | Admitting: Gastroenterology

## 2012-04-08 LAB — URINE CULTURE

## 2012-04-08 NOTE — Telephone Encounter (Signed)
Pt has been added to the schedule for tomorrow pt is aware

## 2012-04-09 ENCOUNTER — Ambulatory Visit (INDEPENDENT_AMBULATORY_CARE_PROVIDER_SITE_OTHER): Payer: Medicare Other | Admitting: Gastroenterology

## 2012-04-09 ENCOUNTER — Encounter: Payer: Self-pay | Admitting: Gastroenterology

## 2012-04-09 ENCOUNTER — Other Ambulatory Visit: Payer: Self-pay

## 2012-04-09 VITALS — BP 102/60 | HR 92 | Ht 62.0 in | Wt 154.8 lb

## 2012-04-09 DIAGNOSIS — K3189 Other diseases of stomach and duodenum: Secondary | ICD-10-CM

## 2012-04-09 DIAGNOSIS — R1013 Epigastric pain: Secondary | ICD-10-CM

## 2012-04-09 MED ORDER — PANTOPRAZOLE SODIUM 20 MG PO TBEC: 20.0000 mg | DELAYED_RELEASE_TABLET | Freq: Every day | ORAL | Status: DC

## 2012-04-09 NOTE — Progress Notes (Signed)
Review of gastrointestinal problems:  1. Chronic abdominal pain. Diagnosed as adhesive disease in the past by Dr. Victorino Dike. CT scan September 2008 showed ventral hiatal hernia was stable. No acute findings in the abdomen or pelvis. January, 2010: repeat pains, went to emergency room, complete metabolic profile, amylase, lipase were normal. CT Scan January, 2010 with IV and oral contrast of abdomen and pelvis was normal. CT Scan December, 2010 done by ER, essentially normal. EGD January 2011: Mild gastritis, duodenitis. Biopsies showed no H. pylori. 2013 imaging: July [place] no clear cause of pain; July CT scan same; November CT scan same. 2. Chronic gastroesophageal reflux disease. Esophagogastroduodenoscopy July 2002 showed no Barrett's esophagus. EGD October 2008 was normal, except for a 2 cm hiatal hernia.  3. History of colon polyps,colonoscopy October 2004 by Dr. Victorino Dike showed no colon polyps. He wrote the indication for that examination was adenomatous polyps. I do not see any pathology reports in our chart indicating adenomatous polyps. Repeat colonoscopy 01/2008 by [facility] was normal to terminal ileum except for small hemorrhoids.  4. Status post remote cholecystectomy  5. Nausea: Winter, 2010; EGD January 2011, see above  HPI: This is a  pleasant 78 year old woman who is here with her daughter today.  Her stomach hurts "really bad, worse at night." has been to ER several times since last visit here in office.  The pain is worse at night. She also has nausea.  Since I last saw her for her myriad chronic GI symptoms she has undergone several more tests to work the pain up, including two CT scan, abd ultrasound, plain abd films, bloodwork. CBC, CMET blood tests have all been persistently normal dating at least back to 2011. The numerous abd imaging tests have all been unhelpful as well. She has had upper and lower endoscopies. I counted 8 CT scans of her abdomen since 2002. I counted 9 plain abd  films since 2004.    Past Medical History  Diagnosis Date  . Irritable bowel syndrome   . Diverticulosis of colon (without mention of hemorrhage)   . Other and unspecified hyperlipidemia   . Unspecified essential hypertension   . Diaphragmatic hernia without mention of obstruction or gangrene   . Esophageal reflux   . Ventral hernia, unspecified, without mention of obstruction or gangrene     Past Surgical History  Procedure Date  . Hiatal hernia repair   . Cholecystectomy     Current Outpatient Prescriptions  Medication Sig Dispense Refill  . alum & mag hydroxide-simeth (MYLANTA) 200-200-20 MG/5ML suspension Take 10 mLs by mouth daily as needed. indigestion      . aspirin 81 MG tablet Take 81 mg by mouth daily.        . clonazePAM (KLONOPIN) 0.5 MG tablet Take 0.5 mg by mouth 2 (two) times daily as needed. For anxiety      . clopidogrel (PLAVIX) 75 MG tablet Take 75 mg by mouth daily.      . colesevelam (WELCHOL) 625 MG tablet Take 625 mg by mouth 2 (two) times daily with a meal.       . dicyclomine (BENTYL) 20 MG tablet Take 20 mg by mouth every 6 (six) hours as needed. Stomach spasms      . famotidine (PEPCID) 20 MG tablet Take 20 mg by mouth 2 (two) times daily.      . Multiple Vitamins-Minerals (CENTRUM SILVER PO) Take by mouth. 1 daily        . olmesartan (BENICAR) 40  MG tablet Take 40 mg by mouth 3 (three) times a week. Take one pill every Tues. Thurs. & Sat.      . olmesartan-hydrochlorothiazide (BENICAR HCT) 40-12.5 MG per tablet Take 1 tablet by mouth 4 (four) times a week. Take on Mon, Wed, Fri & Sun      . pantoprazole (PROTONIX) 20 MG tablet Take 1 tablet (20 mg total) by mouth daily.  30 tablet  0  . promethazine (PHENERGAN) 25 MG tablet Take 25 mg by mouth 2 (two) times daily as needed. nausea      . sucralfate (CARAFATE) 1 G tablet Take 1 tablet (1 g total) by mouth 4 (four) times daily.  60 tablet  0    Allergies as of 04/09/2012 - Review Complete 04/09/2012   Allergen Reaction Noted  . Pro-flora concentrate  11/09/2007    Family History  Problem Relation Age of Onset  . Colon cancer Neg Hx   . Breast cancer Daughter 49    History   Social History  . Marital Status: Widowed    Spouse Name: N/A    Number of Children: 4  . Years of Education: N/A   Occupational History  . retired    Social History Main Topics  . Smoking status: Never Smoker   . Smokeless tobacco: Never Used  . Alcohol Use: No  . Drug Use: No  . Sexually Active: Not on file   Other Topics Concern  . Not on file   Social History Narrative  . No narrative on file      Physical Exam: BP 102/60  Pulse 92  Ht 5\' 2"  (1.575 m)  Wt 154 lb 12.8 oz (70.217 kg)  BMI 28.31 kg/m2 Constitutional: generally well-appearing Psychiatric: alert and oriented x3 Abdomen: soft, nontender, nondistended, no obvious ascites, no peritoneal signs, normal bowel sounds     Assessment and plan: 78 y.o. female with her usual chronic abdominal pains, GI symptoms.  Since I last saw her for her myriad chronic GI symptoms she has undergone several more tests to work the pain up, including two CT scan, abd ultrasound, plain abd films, bloodwork. CBC, CMET blood tests have all been persistently normal dating at least back to 2011. The numerous abd imaging tests have all been unhelpful as well. She has had upper and lower endoscopies. I counted 8 CT scans of her abdomen since 2002. I counted 9 plain abd films since 2004.   I was very clear that she has had extensive GI testing for her chronic pains and these tests have not helped.  I don't think she should undergo any further testing. I recommended she restart her proton pump inhibitor in case this condition is acid related.  She asked about other opinions and I am happy to forward my notes to any other providers that she wants to see. I encouraged her to seek second opinions that's what she wants.

## 2012-04-09 NOTE — Patient Instructions (Addendum)
Start taking the protonix (it is already on your medicine list but you tell me you are not taking it).  This is best taken 20-30 min before a meal (breakfast or dinner meals). Stop the sucralfate/carafate medicine.  It doesn't seem to be helping anyway.

## 2012-04-10 NOTE — ED Provider Notes (Addendum)
History/physical exam/procedure(s) were performed by non-physician practitioner and as supervising physician I was immediately available for consultation/collaboration. I have reviewed all notes and am in agreement with care and plan. 78 y.o. Female with intermittent abdominal pain for two weeks like abdominal pain she has been having for up to six months.    Abdomen is soft and nontender.  Patient has normal labs.  We discussed results and ct obtained.  Patient advised follow up with her gi physician.   Hilario Quarry, MD 04/10/12 4098  Hilario Quarry, MD 05/16/12 2202

## 2012-04-27 ENCOUNTER — Ambulatory Visit: Payer: Medicare Other | Admitting: Gastroenterology

## 2012-05-13 ENCOUNTER — Telehealth: Payer: Self-pay | Admitting: Gastroenterology

## 2012-05-13 NOTE — Telephone Encounter (Signed)
Pt was advised that she should have stopped the carafate per her last office note.  She agreed and will start her protonix

## 2012-05-18 ENCOUNTER — Telehealth: Payer: Self-pay | Admitting: Gastroenterology

## 2012-05-19 NOTE — Telephone Encounter (Signed)
Pt was advised that she can eat her normal diet as long as it causes her no problems if a food causes her to have symptoms avoid that food.  Pt thanked me for calling and will call with any further questions

## 2012-10-21 ENCOUNTER — Telehealth: Payer: Self-pay | Admitting: Gastroenterology

## 2012-10-21 NOTE — Telephone Encounter (Signed)
i agree, thanks 

## 2012-10-21 NOTE — Telephone Encounter (Signed)
Pt states that she thinks the nexium is causing her to be nauseous, I advised her to try the pepcid alone for a few days and see if the nausea improves.  If not go back to the nexium and stop the pepcid and see if that helps.  If still no relief call back.

## 2012-11-23 ENCOUNTER — Telehealth: Payer: Self-pay | Admitting: Gastroenterology

## 2012-11-23 NOTE — Telephone Encounter (Signed)
Pt is taking her meds as directed but continues to have abd pain, per Dr Christella Hartigan last office note she was going to seek a second opinion all GI test have not been helpful.  Pt states she is going to call her PCP.  I forwarded a copy of Dr Christella Hartigan last office note to Dr Petra Kuba

## 2012-11-23 NOTE — Telephone Encounter (Signed)
Line busy

## 2012-12-23 ENCOUNTER — Ambulatory Visit (HOSPITAL_COMMUNITY)
Admission: RE | Admit: 2012-12-23 | Discharge: 2012-12-23 | Disposition: A | Discharge status: Home / Self Care | Payer: Medicare Other | Source: Ambulatory Visit | Attending: Pulmonary Disease | Admitting: Pulmonary Disease

## 2012-12-23 ENCOUNTER — Other Ambulatory Visit (HOSPITAL_COMMUNITY): Payer: Self-pay | Admitting: Pulmonary Disease

## 2012-12-23 DIAGNOSIS — R42 Dizziness and giddiness: Secondary | ICD-10-CM

## 2012-12-23 NOTE — Progress Notes (Signed)
VASCULAR LAB PRELIMINARY  PRELIMINARY  PRELIMINARY  PRELIMINARY  Carotid duplex  completed.    Preliminary report:  Bilateral:  1-39% ICA stenosis.  Vertebral artery flow is antegrade.      Ann Riddle, RVT 12/23/2012, 10:59 AM

## 2013-01-04 ENCOUNTER — Emergency Department (HOSPITAL_COMMUNITY)
Admission: EM | Admit: 2013-01-04 | Discharge: 2013-01-04 | Disposition: A | Discharge status: Home / Self Care | Payer: Medicare Other | Attending: Emergency Medicine | Admitting: Emergency Medicine

## 2013-01-04 ENCOUNTER — Encounter (HOSPITAL_COMMUNITY): Payer: Self-pay

## 2013-01-04 DIAGNOSIS — Z7982 Long term (current) use of aspirin: Secondary | ICD-10-CM | POA: Insufficient documentation

## 2013-01-04 DIAGNOSIS — K219 Gastro-esophageal reflux disease without esophagitis: Secondary | ICD-10-CM | POA: Insufficient documentation

## 2013-01-04 DIAGNOSIS — Z79899 Other long term (current) drug therapy: Secondary | ICD-10-CM | POA: Insufficient documentation

## 2013-01-04 DIAGNOSIS — R110 Nausea: Secondary | ICD-10-CM | POA: Insufficient documentation

## 2013-01-04 DIAGNOSIS — I10 Essential (primary) hypertension: Secondary | ICD-10-CM | POA: Insufficient documentation

## 2013-01-04 DIAGNOSIS — Z8639 Personal history of other endocrine, nutritional and metabolic disease: Secondary | ICD-10-CM | POA: Insufficient documentation

## 2013-01-04 DIAGNOSIS — Z862 Personal history of diseases of the blood and blood-forming organs and certain disorders involving the immune mechanism: Secondary | ICD-10-CM | POA: Insufficient documentation

## 2013-01-04 DIAGNOSIS — F430 Acute stress reaction: Secondary | ICD-10-CM | POA: Insufficient documentation

## 2013-01-04 DIAGNOSIS — R51 Headache: Secondary | ICD-10-CM | POA: Insufficient documentation

## 2013-01-04 DIAGNOSIS — F439 Reaction to severe stress, unspecified: Secondary | ICD-10-CM

## 2013-01-04 DIAGNOSIS — Z7902 Long term (current) use of antithrombotics/antiplatelets: Secondary | ICD-10-CM | POA: Insufficient documentation

## 2013-01-04 DIAGNOSIS — R42 Dizziness and giddiness: Secondary | ICD-10-CM | POA: Insufficient documentation

## 2013-01-04 LAB — CBC
HCT: 35.7 % — ABNORMAL LOW (ref 36.0–46.0)
Hemoglobin: 11.9 g/dL — ABNORMAL LOW (ref 12.0–15.0)
Platelets: 203 10*3/uL (ref 150–400)
RDW: 13.5 % (ref 11.5–15.5)

## 2013-01-04 LAB — URINALYSIS, ROUTINE W REFLEX MICROSCOPIC
Bilirubin Urine: NEGATIVE
Glucose, UA: NEGATIVE mg/dL
Ketones, ur: NEGATIVE mg/dL
Nitrite: NEGATIVE
Protein, ur: NEGATIVE mg/dL
pH: 6.5 (ref 5.0–8.0)

## 2013-01-04 LAB — PROTIME-INR: Prothrombin Time: 14.1 seconds (ref 11.6–15.2)

## 2013-01-04 LAB — BASIC METABOLIC PANEL
BUN: 17 mg/dL (ref 6–23)
CO2: 32 mEq/L (ref 19–32)
Chloride: 101 mEq/L (ref 96–112)
GFR calc Af Amer: 68 mL/min — ABNORMAL LOW (ref >90)
Glucose, Bld: 94 mg/dL (ref 70–99)
Potassium: 3.7 mEq/L (ref 3.5–5.1)

## 2013-01-04 LAB — URINE MICROSCOPIC-ADD ON

## 2013-01-04 LAB — SEDIMENTATION RATE: Sed Rate: 17 mm/hr (ref 0–22)

## 2013-01-04 NOTE — ED Notes (Signed)
Patient is having pain in her head and abdomen.  She reports abdominal pain is worse at night.  Patient reports nausea but no vomiting.  She also reports dizziness at times.  Denies chest pain, or SOB.

## 2013-01-04 NOTE — ED Provider Notes (Signed)
CSN: [id number]     Arrival date & time 01/04/13  1307 History   First MD Initiated Contact with Patient 01/04/13 1459     Chief Complaint  Patient presents with  . Dizziness  . Headache   (Consider location/radiation/quality/duration/timing/severity/associated sxs/prior Treatment) HPI  Patient reports she started getting headaches about 2 weeks ago. States they are holocranial and described as throbbing. She has nausea without vomiting. The headaches come and go although 3 days ago it lasted all day. She states she takes Tylenol and it feels better. After the headache she feels dizzy like she is lightheaded. She denies numbness or tingling in her extremities. She denies any difficulty walking. She denies any change in her vision. When asked she rates her headache right now she states "it's not bad" and states her headache is a 7. When we discussed the pain scale further she said her headache was a 1/10. She states she took Tylenol before she came to the hospital. She states she's had headaches before but they usually don't last as long. When asked if she's under any stress she states her granddaughter has chronic headaches that they cannot find the etiology of and her daughter was diagnosed with cancer about a month ago. She also states today she had a fullness in her chest and states she has reflux disease. She states her chest was tight after she got up this morning. She states she normally gets it at night. She denies any history of stroke, TIA, DVT, or arrhythmia. She does not know why she is on the Plavix. She also takes 81 mg aspirin a day. She states 2 weeks ago her doctor did a Doppler ultrasound of her neck when she was complaining of headaches and she was told she had a blood flow problem to her brain. She states surgery was not discussed.  PCP Dr Petra Kuba  Past Medical History  Diagnosis Date  . Irritable bowel syndrome   . Diverticulosis of colon (without mention of hemorrhage)   .  Other and unspecified hyperlipidemia   . Unspecified essential hypertension   . Diaphragmatic hernia without mention of obstruction or gangrene   . Esophageal reflux   . Ventral hernia, unspecified, without mention of obstruction or gangrene    Past Surgical History  Procedure Laterality Date  . Hiatal hernia repair    . Cholecystectomy     Family History  Problem Relation Age of Onset  . Colon cancer Neg Hx   . Breast cancer Daughter 34   History  Substance Use Topics  . Smoking status: Never Smoker   . Smokeless tobacco: Never Used  . Alcohol Use: No   Lives at home Lives alone  OB History   Grav Para Term Preterm Abortions TAB SAB Ect Mult Living                 Review of Systems  All other systems reviewed and are negative.    Allergies  Flora-q  Home Medications   Current Outpatient Rx  Name  Route  Sig  Dispense  Refill  . alum & mag hydroxide-simeth (MYLANTA) 200-200-20 MG/5ML suspension   Oral   Take 10 mLs by mouth daily as needed. indigestion         . aspirin 81 MG tablet   Oral   Take 81 mg by mouth daily.           . clonazePAM (KLONOPIN) 0.5 MG tablet   Oral   Take 0.5 mg  by mouth 2 (two) times daily as needed. For anxiety         . clopidogrel (PLAVIX) 75 MG tablet   Oral   Take 75 mg by mouth daily.         . colesevelam (WELCHOL) 625 MG tablet   Oral   Take 625 mg by mouth 2 (two) times daily with a meal.          . famotidine (PEPCID) 20 MG tablet   Oral   Take 20 mg by mouth 2 (two) times daily.         . Multiple Vitamins-Minerals (MULTIVITAMIN GUMMIES ADULTS PO)   Oral   Take 2 tablets by mouth daily.         Marland Kitchen olmesartan (BENICAR) 40 MG tablet   Oral   Take 40 mg by mouth 3 (three) times a week. Take one pill every Tues. Thurs. & Sat.         . olmesartan-hydrochlorothiazide (BENICAR HCT) 40-12.5 MG per tablet   Oral   Take 1 tablet by mouth 4 (four) times a week. Take on Mon, Wed, Fri & Sun           BP 141/58  Pulse 78  Temp(Src) 98.7 F (37.1 C) (Oral)  Resp 16  SpO2 99%  Vital signs normal   Physical Exam  Nursing note and vitals reviewed. Constitutional: She is oriented to person, place, and time. She appears well-developed and well-nourished.  Non-toxic appearance. She does not appear ill. No distress.  HENT:  Head: Normocephalic and atraumatic.  Right Ear: External ear normal.  Left Ear: External ear normal.  Nose: Nose normal. No mucosal edema or rhinorrhea.  Mouth/Throat: Oropharynx is clear and moist and mucous membranes are normal. No dental abscesses or edematous.  Eyes: Conjunctivae and EOM are normal. Pupils are equal, round, and reactive to light.  Neck: Normal range of motion and full passive range of motion without pain. Neck supple.  No bruits  Cardiovascular: Normal rate, regular rhythm and normal heart sounds.  Exam reveals no gallop and no friction rub.   No murmur heard. Pulmonary/Chest: Effort normal and breath sounds normal. No respiratory distress. She has no wheezes. She has no rhonchi. She has no rales. She exhibits no tenderness and no crepitus.  Abdominal: Soft. Normal appearance and bowel sounds are normal. She exhibits no distension. There is no tenderness. There is no rebound and no guarding.  Musculoskeletal: Normal range of motion. She exhibits no edema and no tenderness.  Moves all extremities well.   Neurological: She is alert and oriented to person, place, and time. She has normal strength. No cranial nerve deficit.  Skin: Skin is warm, dry and intact. No rash noted. No erythema. No pallor.  Psychiatric: She has a normal mood and affect. Her speech is normal and behavior is normal. Her mood appears not anxious.    ED Course  Procedures (including critical care time)  Pt remains unchanged during her ED visit. She was agreeable to seeing a counselor about how to deal with her stress better.  We discussed her doppler US results, she is  already on appropriate medical treatment with the plavix and ASA.   VASCULAR LAB  PRELIMINARY PRELIMINARY PRELIMINARY PRELIMINARY  Carotid duplex completed.  Preliminary report: Bilateral: 1-39% ICA stenosis. Vertebral artery flow is antegrade.  CESTONE, HELENE, RVT  12/23/2012, 10:59 AM      Labs Review  Results for orders placed during the hospital encounter of 01/04/13  BASIC METABOLIC PANEL      Result Value Range   Sodium 139  135 - 145 mEq/L   Potassium 3.7  3.5 - 5.1 mEq/L   Chloride 101  96 - 112 mEq/L   CO2 32  19 - 32 mEq/L   Glucose, Bld 94  70 - 99 mg/dL   BUN 17  6 - 23 mg/dL   Creatinine, Ser 1.47  0.50 - 1.10 mg/dL   Calcium 9.7  8.4 - 82.9 mg/dL   GFR calc non Af Amer 59 (*) >90 mL/min   GFR calc Af Amer 68 (*) >90 mL/min  CBC      Result Value Range   WBC 3.8 (*) 4.0 - 10.5 K/uL   RBC 4.11  3.87 - 5.11 MIL/uL   Hemoglobin 11.9 (*) 12.0 - 15.0 g/dL   HCT 56.2 (*) 13.0 - 86.5 %   MCV 86.9  78.0 - 100.0 fL   MCH 29.0  26.0 - 34.0 pg   MCHC 33.3  30.0 - 36.0 g/dL   RDW 78.4  69.6 - 29.5 %   Platelets 203  150 - 400 K/uL  PROTIME-INR      Result Value Range   Prothrombin Time 14.1  11.6 - 15.2 seconds   INR 1.11  0.00 - 1.49  SEDIMENTATION RATE      Result Value Range   Sed Rate 17  0 - 22 mm/hr  URINALYSIS, ROUTINE W REFLEX MICROSCOPIC      Result Value Range   Color, Urine YELLOW  YELLOW   APPearance CLEAR  CLEAR   Specific Gravity, Urine 1.011  1.005 - 1.030   pH 6.5  5.0 - 8.0   Glucose, UA NEGATIVE  NEGATIVE mg/dL   Hgb urine dipstick SMALL (*) NEGATIVE   Bilirubin Urine NEGATIVE  NEGATIVE   Ketones, ur NEGATIVE  NEGATIVE mg/dL   Protein, ur NEGATIVE  NEGATIVE mg/dL   Urobilinogen, UA 0.2  0.0 - 1.0 mg/dL   Nitrite NEGATIVE  NEGATIVE   Leukocytes, UA SMALL (*) NEGATIVE  TROPONIN I      Result Value Range   Troponin I <0.30  <0.30 ng/mL  URINE MICROSCOPIC-ADD ON      Result Value Range   Squamous Epithelial / LPF FEW (*) RARE   WBC, UA  3-6  <3 WBC/hpf   RBC / HPF 3-6  <3 RBC/hpf     Laboratory interpretation all normal except mild anemia, ? UTI   Date: 01/04/2013  Rate: 78  Rhythm: normal sinus rhythm  QRS Axis: normal  Intervals: normal  ST/T Wave abnormalities: normal  Conduction Disutrbances:none  Narrative Interpretation:   Old EKG Reviewed: unchanged from 04/06/2012     MDM   1. Headache   2. Stress    Plan discharge   Devoria Albe, MD, Franz Dell, MD 01/04/13 [phone]

## 2013-01-04 NOTE — ED Notes (Signed)
Pt states had a [place] of her Caroid, states "Dr. Petra Kuba said not a lot of blood getting to my brain is why I'm having headaches"; pt states was told every thing was ok, c/o headache x2wks with dizziness

## 2013-01-07 ENCOUNTER — Telehealth: Payer: Self-pay | Admitting: Gastroenterology

## 2013-01-07 NOTE — Telephone Encounter (Signed)
Dr Christella Hartigan can I send the stool cards?

## 2013-01-07 NOTE — Telephone Encounter (Signed)
No, i think she should be seen in office before checking her for blood in her stool.

## 2013-01-07 NOTE — Telephone Encounter (Signed)
Pt has been notified and scheduled to see Dr Christella Hartigan

## 2013-01-31 ENCOUNTER — Emergency Department (INDEPENDENT_AMBULATORY_CARE_PROVIDER_SITE_OTHER)
Admission: EM | Admit: 2013-01-31 | Discharge: 2013-01-31 | Disposition: A | Discharge status: Home / Self Care | Payer: Medicare Other | Source: Home / Self Care | Attending: Emergency Medicine | Admitting: Emergency Medicine

## 2013-01-31 ENCOUNTER — Encounter (HOSPITAL_COMMUNITY): Payer: Self-pay | Admitting: Emergency Medicine

## 2013-01-31 DIAGNOSIS — H9201 Otalgia, right ear: Secondary | ICD-10-CM

## 2013-01-31 DIAGNOSIS — H9209 Otalgia, unspecified ear: Secondary | ICD-10-CM

## 2013-01-31 MED ORDER — CEPHALEXIN 500 MG PO CAPS: 500.0000 mg | ORAL_CAPSULE | Freq: Three times a day (TID) | ORAL | Status: DC

## 2013-01-31 NOTE — ED Notes (Signed)
C/o right ear pain for a week now.

## 2013-01-31 NOTE — ED Provider Notes (Signed)
Chief Complaint:   Chief Complaint  Patient presents with  . Otalgia    History of Present Illness:   GWENLYN HOTTINGER is an 79 year old female who's had a one-week history of right ear pain and congestion. She denies any drainage. She's had some headache and slight rhinorrhea. She denies any nasal congestion, sore throat, adenopathy, or cough. She denies any jaw pain or pain with biting or chewing.  Review of Systems:  Other than noted above, the patient denies any of the following symptoms: Systemic:  No fevers, chills, sweats, weight loss or gain, fatigue, or tiredness. Eye:  No redness, pain, discharge, itching, blurred vision, or diplopia. ENT:  No headache, nasal congestion, sneezing, itching, epistaxis, ear pain, congestion, decreased hearing, ringing in ears, vertigo, or tinnitus.  No oral lesions, sore throat, pain on swallowing, or hoarseness. Neck:  No mass, tenderness or adenopathy. Lungs:  No coughing, wheezing, or shortness of breath. Skin:  No rash or itching.  PMFSH:  Past medical history, family history, social history, meds, and allergies were reviewed. She has no known allergies. She takes Plavix, aspirin, WelChol, and a blood pressure pill. She has a carotid artery occlusion, hypertension, and hyperlipidemia.  Physical Exam:   Vital signs:  BP 134/46  Pulse 87  Temp(Src) 98.4 F (36.9 C) (Oral)  Resp 16  SpO2 100% General:  Alert and oriented.  In no distress.  Skin warm and dry. Eye:  PERRL, full EOMs, lids and conjunctiva normal.   ENT:  TMs and canals clear. There is very slight pain when the ear speculum was inserted. Nasal mucosa not congested and without drainage.  Mucous membranes moist, no oral lesions, normal dentition, pharynx clear.  No cranial or facial pain to palplation. There is no pain to palpation over the TMJs and no crepitus. Neck:  Supple, full ROM.  No adenopathy, tenderness or mass.  Thyroid normal. Lungs:  Breath sounds clear and equal  bilaterally.  No wheezes, rales or rhonchi. Heart:  Rhythm regular, without extrasystoles.  No gallops or murmers. Skin:  Clear, warm and dry.  Assessment:  The encounter diagnosis was Otalgia, right.  The TM and canal appear normal. There is no obvious evidence of TMJ syndrome. She may have a small furuncle of a hair follicle in the external ear canal. Will treat with antibiotics. Return if no better in 10 days.  Plan:   1.  Meds:  The following meds were prescribed:   Discharge Medication List as of 01/31/2013  4:08 PM    START taking these medications   Details  cephALEXin (KEFLEX) 500 MG capsule Take 1 capsule (500 mg total) by mouth 3 (three) times daily., Starting 01/31/2013, Until Discontinued, Normal        2.  Patient Education/Counseling:  The patient was given appropriate handouts, self care instructions, and instructed in symptomatic relief.  Suggested application of heat.  3.  Follow up:  The patient was told to follow up if no better in 3 to 4 days, if becoming worse in any way, and given some red flag symptoms such as worsening pain which would prompt immediate return.  Follow up here if needed.     Reuben Likes, MD 01/31/13 2227

## 2013-02-02 NOTE — ED Notes (Signed)
Patient called, c/o she is very nauseated and thinks this is due to her antibiotic rx. Spoke w Dr Lorenz Coaster, who authorized new rx for pt. Called 2 rx to [facility], [address] at request. Left message on answering machine for Amoxicillin 500 mg PO #30, tid, and for Zofran 8 mg #20, q 8 hours PRN nausea

## 2013-02-14 ENCOUNTER — Encounter: Payer: Self-pay | Admitting: Gastroenterology

## 2013-02-14 ENCOUNTER — Ambulatory Visit (INDEPENDENT_AMBULATORY_CARE_PROVIDER_SITE_OTHER): Payer: Medicare Other | Admitting: Gastroenterology

## 2013-02-14 ENCOUNTER — Other Ambulatory Visit (INDEPENDENT_AMBULATORY_CARE_PROVIDER_SITE_OTHER): Payer: Medicare Other

## 2013-02-14 VITALS — BP 110/60 | HR 121 | Ht 62.0 in | Wt 155.5 lb

## 2013-02-14 DIAGNOSIS — K625 Hemorrhage of anus and rectum: Secondary | ICD-10-CM

## 2013-02-14 LAB — CBC WITH DIFFERENTIAL/PLATELET
Basophils Relative: 0.4 % (ref 0.0–3.0)
Eosinophils Absolute: 0.1 10*3/uL (ref 0.0–0.7)
HCT: 34.7 % — ABNORMAL LOW (ref 36.0–46.0)
Hemoglobin: 11.5 g/dL — ABNORMAL LOW (ref 12.0–15.0)
Lymphs Abs: 1.1 10*3/uL (ref 0.7–4.0)
MCHC: 33.3 g/dL (ref 30.0–36.0)
MCV: 86.4 fl (ref 78.0–100.0)
Monocytes Absolute: 0.4 10*3/uL (ref 0.1–1.0)
Neutro Abs: 2.3 10*3/uL (ref 1.4–7.7)
RBC: 4.01 Mil/uL (ref 3.87–5.11)

## 2013-02-14 NOTE — Patient Instructions (Addendum)
You will have labs checked today in the basement lab.  Please head down after you check out with the front desk  (cbc). If you are significantly anemic, then stool cards will be sent for completion. Change your pepcid to one pill at bedtime every night.  Don't take it prior to eating.

## 2013-02-14 NOTE — Progress Notes (Signed)
Review of gastrointestinal problems:  1. Chronic abdominal pain. Diagnosed as adhesive disease in the past by Dr. Victorino Dike. CT scan September 2008 showed ventral hiatal hernia was stable. No acute findings in the abdomen or pelvis. January, 2010: repeat pains, went to emergency room, complete metabolic profile, amylase, lipase were normal. CT Scan January, 2010 with IV and oral contrast of abdomen and pelvis was normal. CT Scan December, 2010 done by ER, essentially normal. EGD January 2011: Mild gastritis, duodenitis. Biopsies showed no H. pylori. 2013 imaging: July [place] no clear cause of pain; July CT scan same; November CT scan same.  2. Chronic gastroesophageal reflux disease. Esophagogastroduodenoscopy July 2002 showed no Barrett's esophagus. EGD October 2008 was normal, except for a 2 cm hiatal hernia.  3. History of colon polyps,colonoscopy October 2004 by Dr. Victorino Dike showed no colon polyps. He wrote the indication for that examination was adenomatous polyps. I do not see any pathology reports in our chart indicating adenomatous polyps. Repeat colonoscopy 01/2008 by [facility] was normal to terminal ileum except for small hemorrhoids.  4. Status post remote cholecystectomy  5. Nausea: Winter, 2010; EGD January 2011, see above  HPI: This is a pleasant 79 year old woman whom I last saw several months ago.  Labs 01/2013 cbc normal except Hb 11.9  Had ear pains, went to ER.  Was put on abx for ear infection. Her ear is feeling better now.  She tells me someone in ER told her that she "may have blood in her stool."  No stools samples taken.  She believes they told her this because she explained she intermittently sees black stools.  Again complained of her usual chronic abd pains.  Today she explains she can have black colored stools.  Never sees red blood in her stool.   Past Medical History  Diagnosis Date  . Irritable bowel syndrome   . Diverticulosis of colon (without mention of hemorrhage)    . Other and unspecified hyperlipidemia   . Unspecified essential hypertension   . Diaphragmatic hernia without mention of obstruction or gangrene   . Esophageal reflux   . Ventral hernia, unspecified, without mention of obstruction or gangrene     Past Surgical History  Procedure Laterality Date  . Hiatal hernia repair    . Cholecystectomy      Current Outpatient Prescriptions  Medication Sig Dispense Refill  . alum & mag hydroxide-simeth (MYLANTA) 200-200-20 MG/5ML suspension Take 10 mLs by mouth daily as needed. indigestion      . aspirin 81 MG tablet Take 81 mg by mouth daily.        . cephALEXin (KEFLEX) 500 MG capsule Take 1 capsule (500 mg total) by mouth 3 (three) times daily.  30 capsule  0  . clonazePAM (KLONOPIN) 0.5 MG tablet Take 0.5 mg by mouth 2 (two) times daily as needed. For anxiety      . clopidogrel (PLAVIX) 75 MG tablet Take 75 mg by mouth daily.      . colesevelam (WELCHOL) 625 MG tablet Take 625 mg by mouth 2 (two) times daily with a meal.       . famotidine (PEPCID) 20 MG tablet Take 20 mg by mouth 2 (two) times daily.      . Multiple Vitamins-Minerals (MULTIVITAMIN GUMMIES ADULTS PO) Take 2 tablets by mouth daily.      Marland Kitchen olmesartan (BENICAR) 40 MG tablet Take 40 mg by mouth 3 (three) times a week. Take one pill every Tues. Thurs. & Sat.      Marland Kitchen  olmesartan-hydrochlorothiazide (BENICAR HCT) 40-12.5 MG per tablet Take 1 tablet by mouth 4 (four) times a week. Take on Mon, Wed, Fri & Sun       No current facility-administered medications for this visit.    Allergies as of 02/14/2013 - Review Complete 02/14/2013  Allergen Reaction Noted  . Flora-q  11/09/2007    Family History  Problem Relation Age of Onset  . Colon cancer Neg Hx   . Breast cancer Daughter 55    History   Social History  . Marital Status: Widowed    Spouse Name: N/A    Number of Children: 4  . Years of Education: N/A   Occupational History  . retired    Social History Main Topics   . Smoking status: Never Smoker   . Smokeless tobacco: Never Used  . Alcohol Use: No  . Drug Use: No  . Sexual Activity: Not on file   Other Topics Concern  . Not on file   Social History Narrative  . No narrative on file      Physical Exam: BP 110/60  Pulse 121  Ht 5\' 2"  (1.575 m)  Wt 155 lb 8 oz (70.534 kg)  BMI 28.43 kg/m2  SpO2 98% Constitutional: generally well-appearing Psychiatric: alert and oriented x3 Abdomen: soft, nontender, nondistended, no obvious ascites, no peritoneal signs, normal bowel sounds     Assessment and plan: 79 y.o. female with chronic abdominal pains, unchanged; intermittent black stools per patient, hemoglobin 11.9 recently.   I will recheck her blood counts today, her hemoglobin last month was 11.9. Her hemoglobin is in her usual range then no further testing needs to be done. If it is lower than last month and I want to confirm the presence of blood in her stool with Hemoccult testing prior to committing to any endoscopic, invasive testing.

## 2013-02-18 ENCOUNTER — Emergency Department (INDEPENDENT_AMBULATORY_CARE_PROVIDER_SITE_OTHER)
Admission: EM | Admit: 2013-02-18 | Discharge: 2013-02-18 | Disposition: A | Discharge status: Home / Self Care | Payer: Medicare Other | Source: Home / Self Care | Attending: Family Medicine | Admitting: Family Medicine

## 2013-02-18 ENCOUNTER — Encounter (HOSPITAL_COMMUNITY): Payer: Self-pay | Admitting: Emergency Medicine

## 2013-02-18 DIAGNOSIS — H9209 Otalgia, unspecified ear: Secondary | ICD-10-CM

## 2013-02-18 DIAGNOSIS — H9201 Otalgia, right ear: Secondary | ICD-10-CM

## 2013-02-18 MED ORDER — NEOMYCIN-COLIST-HC-THONZONIUM 3.3-3-10-0.5 MG/ML OT SUSP: 3.0000 [drp] | Freq: Four times a day (QID) | OTIC | Status: DC

## 2013-02-18 NOTE — ED Notes (Signed)
Returned patients  Call     Pt      Reports  She  Is  Still  Having     Ear   Problems  Feeling  As if it is  Blocked  She  Was  Seen ucc  Over   2  Weeks  Ago    - pt  Was  Advised  Since  It had  Been so long  She  Would  Need  To   Rechecked     She  Thanked  This  Clinical research associate

## 2013-02-18 NOTE — ED Notes (Signed)
C/o continues problems w her ear, here for recheck

## 2013-02-18 NOTE — ED Provider Notes (Signed)
Ann Riddle is a 79 y.o. female who presents to Urgent Care today for right ear pain. Right ear pain. Patient was seen at [facility] recently for ear pain. She was thought to have external ear canal folliculitis or cellulitis. She was treated with amoxicillin which worked for a few days. She notes a fullness and mild ear pain. The pain is not alleviated or worsened with anything. She has not tried other medications.   Past Medical History  Diagnosis Date  . Irritable bowel syndrome   . Diverticulosis of colon (without mention of hemorrhage)   . Other and unspecified hyperlipidemia   . Unspecified essential hypertension   . Diaphragmatic hernia without mention of obstruction or gangrene   . Esophageal reflux   . Ventral hernia, unspecified, without mention of obstruction or gangrene    History  Substance Use Topics  . Smoking status: Never Smoker   . Smokeless tobacco: Never Used  . Alcohol Use: No   ROS as above Medications reviewed. Current Facility-Administered Medications  Medication Dose Route Frequency Provider Last Rate Last Dose  . neomycin-colistin-hydrocortisone-thonzonium (CORTISPORIN TC) otic suspension 3 drop  3 drop Right Ear Q6H Rodolph Bong, MD       Current Outpatient Prescriptions  Medication Sig Dispense Refill  . alum & mag hydroxide-simeth (MYLANTA) 200-200-20 MG/5ML suspension Take 10 mLs by mouth daily as needed. indigestion      . aspirin 81 MG tablet Take 81 mg by mouth daily.        . clonazePAM (KLONOPIN) 0.5 MG tablet Take 0.5 mg by mouth 2 (two) times daily as needed. For anxiety      . clopidogrel (PLAVIX) 75 MG tablet Take 75 mg by mouth daily.      . colesevelam (WELCHOL) 625 MG tablet Take 625 mg by mouth 2 (two) times daily with a meal.       . famotidine (PEPCID) 20 MG tablet Take 20 mg by mouth 2 (two) times daily.      . Multiple Vitamins-Minerals (MULTIVITAMIN GUMMIES ADULTS PO) Take 2 tablets by mouth daily.      Marland Kitchen olmesartan  (BENICAR) 40 MG tablet Take 40 mg by mouth 3 (three) times a week. Take one pill every Tues. Thurs. & Sat.      . olmesartan-hydrochlorothiazide (BENICAR HCT) 40-12.5 MG per tablet Take 1 tablet by mouth 4 (four) times a week. Take on Mon, Wed, Fri & Sun        Exam:  BP 131/44  Pulse 83  Temp(Src) 97.8 F (36.6 C) (Oral)  Resp 16  SpO2 99% Gen: Well NAD HEENT: EOMI,  MMM. Left ear is normal appearing Right ear is normal appearing with normal ear canal. Nontender over the mastoids bilaterally.  No results found for this or any previous visit (from the past 24 hour(s)). No results found.  Assessment and Plan: 79 y.o. female with left ear pain. Unclear etiology. Possibly related to ear canal issue.  I gave Cortisporin otic prior to discharge. She will take a bottle and use as needed. This may help reduce inflammation or remaining infection.  She'll followup if not improved. Discussed warning signs or symptoms. Please see discharge instructions. Patient expresses understanding.      Rodolph Bong, MD 02/18/13 [phone]

## 2013-03-11 ENCOUNTER — Ambulatory Visit: Payer: Medicare Other

## 2013-03-14 LAB — HEMOCCULT SLIDES (X 3 CARDS)
Fecal Occult Blood: NEGATIVE
OCCULT 1: NEGATIVE
OCCULT 3: NEGATIVE
OCCULT 4: NEGATIVE

## 2013-04-15 ENCOUNTER — Telehealth: Payer: Self-pay | Admitting: Gastroenterology

## 2013-04-18 NOTE — Telephone Encounter (Signed)
Pt has not been taking her Pepcid as directed by Dr Christella Hartigan.  I advised her to take it at bedtime NOT before eating per Dr Christella Hartigan last note.  She agreed and will call back if no better

## 2013-05-13 ENCOUNTER — Telehealth: Payer: Self-pay | Admitting: Gastroenterology

## 2013-05-13 NOTE — Telephone Encounter (Signed)
That is ok with me if it is ok with Dr. Olevia Perches.

## 2013-05-13 NOTE — Telephone Encounter (Signed)
Is this ok with you Dr Olevia Perches?

## 2013-05-13 NOTE — Telephone Encounter (Signed)
Is this ok with you  ?

## 2013-05-14 NOTE — Telephone Encounter (Signed)
I  Will not accept in transfer.

## 2013-05-16 NOTE — Telephone Encounter (Signed)
OK 

## 2013-05-16 NOTE — Telephone Encounter (Signed)
Dr Ardis Hughs, Dr Olevia Perches will not accept.  Do you want to keep the pt?

## 2013-05-16 NOTE — Telephone Encounter (Signed)
Pt is aware and states she will find a new GI and ask for records at that time

## 2013-05-16 NOTE — Telephone Encounter (Signed)
I'm happy to see her in follow up.  Also happy to transfer her records to other GI if that is what she decides.

## 2013-06-08 ENCOUNTER — Telehealth: Payer: Self-pay | Admitting: Gastroenterology

## 2013-06-08 NOTE — Telephone Encounter (Signed)
I will not accept her in transfer - see no problems with her care

## 2013-06-08 NOTE — Telephone Encounter (Signed)
Pt request transfer to Dr Carlean Purl, I will forward to him for review.

## 2013-06-08 NOTE — Telephone Encounter (Signed)
I'm happy to see her in follow up, her care is well summarized on my last office note 3-4 months ago.  Also happy to transfer her records to other GI (Dr. Carlean Purl) if that is what she decides.  Dr. Olevia Perches declined transfer of care last month.

## 2013-06-09 NOTE — Telephone Encounter (Signed)
Patient notified the Dr. Carlean Purl will not be able to see her as a patient

## 2013-06-13 ENCOUNTER — Telehealth: Payer: Self-pay | Admitting: Gastroenterology

## 2013-06-14 NOTE — Telephone Encounter (Signed)
Left a message for patient to return my call. 

## 2013-06-15 MED ORDER — ESOMEPRAZOLE MAGNESIUM 40 MG PO CPDR: 40.0000 mg | DELAYED_RELEASE_CAPSULE | Freq: Every day | ORAL | Status: DC

## 2013-06-15 NOTE — Telephone Encounter (Signed)
Told patient that is fine and we can send the prescription for Nexium.

## 2013-12-01 ENCOUNTER — Ambulatory Visit
Admission: RE | Admit: 2013-12-01 | Discharge: 2013-12-01 | Disposition: A | Discharge status: Home / Self Care | Payer: Medicare Other | Source: Ambulatory Visit | Attending: Gastroenterology | Admitting: Gastroenterology

## 2013-12-01 ENCOUNTER — Other Ambulatory Visit: Payer: Self-pay | Admitting: Gastroenterology

## 2013-12-01 DIAGNOSIS — R109 Unspecified abdominal pain: Secondary | ICD-10-CM

## 2013-12-22 ENCOUNTER — Other Ambulatory Visit: Payer: Self-pay | Admitting: Gastroenterology

## 2013-12-22 ENCOUNTER — Encounter (INDEPENDENT_AMBULATORY_CARE_PROVIDER_SITE_OTHER): Payer: Self-pay

## 2013-12-22 ENCOUNTER — Ambulatory Visit
Admission: RE | Admit: 2013-12-22 | Discharge: 2013-12-22 | Disposition: A | Discharge status: Home / Self Care | Payer: Medicare Other | Source: Ambulatory Visit | Attending: Gastroenterology | Admitting: Gastroenterology

## 2013-12-22 DIAGNOSIS — K5900 Constipation, unspecified: Secondary | ICD-10-CM

## 2013-12-22 DIAGNOSIS — R109 Unspecified abdominal pain: Secondary | ICD-10-CM

## 2014-01-14 ENCOUNTER — Encounter (HOSPITAL_COMMUNITY): Payer: Self-pay | Admitting: Emergency Medicine

## 2014-01-14 ENCOUNTER — Emergency Department (HOSPITAL_COMMUNITY)
Admission: EM | Admit: 2014-01-14 | Discharge: 2014-01-14 | Disposition: A | Discharge status: Home / Self Care | Payer: Medicare Other | Attending: Emergency Medicine | Admitting: Emergency Medicine

## 2014-01-14 DIAGNOSIS — R5383 Other fatigue: Secondary | ICD-10-CM

## 2014-01-14 DIAGNOSIS — Z7982 Long term (current) use of aspirin: Secondary | ICD-10-CM | POA: Diagnosis not present

## 2014-01-14 DIAGNOSIS — G8929 Other chronic pain: Secondary | ICD-10-CM | POA: Insufficient documentation

## 2014-01-14 DIAGNOSIS — K219 Gastro-esophageal reflux disease without esophagitis: Secondary | ICD-10-CM | POA: Insufficient documentation

## 2014-01-14 DIAGNOSIS — Z862 Personal history of diseases of the blood and blood-forming organs and certain disorders involving the immune mechanism: Secondary | ICD-10-CM | POA: Insufficient documentation

## 2014-01-14 DIAGNOSIS — K589 Irritable bowel syndrome without diarrhea: Secondary | ICD-10-CM | POA: Diagnosis not present

## 2014-01-14 DIAGNOSIS — Z90710 Acquired absence of both cervix and uterus: Secondary | ICD-10-CM | POA: Insufficient documentation

## 2014-01-14 DIAGNOSIS — R109 Unspecified abdominal pain: Secondary | ICD-10-CM | POA: Diagnosis present

## 2014-01-14 DIAGNOSIS — R1084 Generalized abdominal pain: Secondary | ICD-10-CM | POA: Insufficient documentation

## 2014-01-14 DIAGNOSIS — I10 Essential (primary) hypertension: Secondary | ICD-10-CM | POA: Insufficient documentation

## 2014-01-14 DIAGNOSIS — Z8639 Personal history of other endocrine, nutritional and metabolic disease: Secondary | ICD-10-CM | POA: Insufficient documentation

## 2014-01-14 DIAGNOSIS — Z9889 Other specified postprocedural states: Secondary | ICD-10-CM | POA: Diagnosis not present

## 2014-01-14 DIAGNOSIS — R5381 Other malaise: Secondary | ICD-10-CM | POA: Diagnosis not present

## 2014-01-14 DIAGNOSIS — Z79899 Other long term (current) drug therapy: Secondary | ICD-10-CM | POA: Insufficient documentation

## 2014-01-14 LAB — COMPREHENSIVE METABOLIC PANEL
ALT: 14 U/L (ref 0–35)
AST: 19 U/L (ref 0–37)
Albumin: 3.8 g/dL (ref 3.5–5.2)
Alkaline Phosphatase: 81 U/L (ref 39–117)
Anion gap: 12 (ref 5–15)
BUN: 10 mg/dL (ref 6–23)
CO2: 28 mEq/L (ref 19–32)
Calcium: 9.8 mg/dL (ref 8.4–10.5)
Chloride: 101 mEq/L (ref 96–112)
Creatinine, Ser: 0.77 mg/dL (ref 0.50–1.10)
GFR calc Af Amer: 85 mL/min — ABNORMAL LOW (ref >90)
GFR calc non Af Amer: 73 mL/min — ABNORMAL LOW (ref >90)
Glucose, Bld: 93 mg/dL (ref 70–99)
Potassium: 3.8 mEq/L (ref 3.7–5.3)
Sodium: 141 mEq/L (ref 137–147)
Total Bilirubin: 0.4 mg/dL (ref 0.3–1.2)
Total Protein: 7.5 g/dL (ref 6.0–8.3)

## 2014-01-14 LAB — URINALYSIS, ROUTINE W REFLEX MICROSCOPIC
Bilirubin Urine: NEGATIVE
Glucose, UA: NEGATIVE mg/dL
Ketones, ur: NEGATIVE mg/dL
Leukocytes, UA: NEGATIVE
Nitrite: NEGATIVE
Protein, ur: NEGATIVE mg/dL
Specific Gravity, Urine: 1.005 (ref 1.005–1.030)
Urobilinogen, UA: 0.2 mg/dL (ref 0.0–1.0)
pH: 7 (ref 5.0–8.0)

## 2014-01-14 LAB — CBC WITH DIFFERENTIAL/PLATELET
Basophils Absolute: 0 10*3/uL (ref 0.0–0.1)
Basophils Relative: 0 % (ref 0–1)
Eosinophils Absolute: 0.1 10*3/uL (ref 0.0–0.7)
Eosinophils Relative: 2 % (ref 0–5)
HCT: 36.8 % (ref 36.0–46.0)
Hemoglobin: 12.2 g/dL (ref 12.0–15.0)
Lymphocytes Relative: 31 % (ref 12–46)
Lymphs Abs: 0.9 10*3/uL (ref 0.7–4.0)
MCH: 28.7 pg (ref 26.0–34.0)
MCHC: 33.2 g/dL (ref 30.0–36.0)
MCV: 86.6 fL (ref 78.0–100.0)
Monocytes Absolute: 0.2 10*3/uL (ref 0.1–1.0)
Monocytes Relative: 7 % (ref 3–12)
Neutro Abs: 1.8 10*3/uL (ref 1.7–7.7)
Neutrophils Relative %: 60 % (ref 43–77)
Platelets: 229 10*3/uL (ref 150–400)
RBC: 4.25 MIL/uL (ref 3.87–5.11)
RDW: 13.4 % (ref 11.5–15.5)
WBC: 3 10*3/uL — ABNORMAL LOW (ref 4.0–10.5)

## 2014-01-14 LAB — I-STAT TROPONIN, ED: Troponin i, poc: 0.01 ng/mL (ref 0.00–0.08)

## 2014-01-14 LAB — URINE MICROSCOPIC-ADD ON

## 2014-01-14 LAB — POC OCCULT BLOOD, ED: FECAL OCCULT BLD: NEGATIVE

## 2014-01-14 MED ORDER — GI COCKTAIL ~~LOC~~
15.0000 mL | Freq: Once | ORAL | Status: AC
  Administered 2014-01-14: 15 mL via ORAL
  Filled 2014-01-14: qty 30

## 2014-01-14 MED ORDER — DICYCLOMINE HCL 20 MG PO TABS: 20.0000 mg | ORAL_TABLET | Freq: Two times a day (BID) | ORAL | Status: DC

## 2014-01-14 MED ORDER — ONDANSETRON 4 MG PO TBDP
4.0000 mg | ORAL_TABLET | Freq: Once | ORAL | Status: AC
  Administered 2014-01-14: 4 mg via ORAL
  Filled 2014-01-14: qty 1

## 2014-01-14 MED ORDER — GI COCKTAIL ~~LOC~~: 30.0000 mL | Freq: Once | ORAL | Status: DC

## 2014-01-14 NOTE — ED Provider Notes (Signed)
CSN: [id number]     Arrival date & time 01/14/14  1053 History   First MD Initiated Contact with Patient 01/14/14 1140     Chief Complaint  Patient presents with  . Flank Pain  . Abdominal Pain     HPI Ann Riddle is a 80 y.o. female with PMH of IBS, diverticulosis, HTN, GERD, hiatal hernia repair presenting with suprapubic, epigastric and left side pain. Pain is burning or soreness and worse with laying down. Pain is worsened by movement and is constant, not improving or worsening. Patient reports chronic abdominal pain. Patient presents today due to taking a bowel prep prescribed by her GI doctor last Friday due to constipation. She states has generalized weakness since. No lightheadedness or dizziness or presyncope. No fevers or chills, cough congestion. Urinary changes. She has nausea but has not vomited. She has dark colored stool but denies melana. Denies blood in stool or hematuria, or urinary complaints. No back pain.   Past Medical History  Diagnosis Date  . Irritable bowel syndrome   . Diverticulosis of colon (without mention of hemorrhage)   . Other and unspecified hyperlipidemia   . Unspecified essential hypertension   . Diaphragmatic hernia without mention of obstruction or gangrene   . Esophageal reflux   . Ventral hernia, unspecified, without mention of obstruction or gangrene    Past Surgical History  Procedure Laterality Date  . Hiatal hernia repair    . Cholecystectomy     Family History  Problem Relation Age of Onset  . Colon cancer Neg Hx   . Breast cancer Daughter 1   History  Substance Use Topics  . Smoking status: Never Smoker   . Smokeless tobacco: Never Used  . Alcohol Use: No   OB History   Grav Para Term Preterm Abortions TAB SAB Ect Mult Living                 Review of Systems  Constitutional: Negative for fever and chills.  HENT: Negative for congestion and rhinorrhea.   Eyes: Negative for visual disturbance.  Respiratory: Negative for  cough and shortness of breath.   Cardiovascular: Negative for chest pain and palpitations.  Gastrointestinal: Positive for nausea and abdominal pain. Negative for vomiting, diarrhea and blood in stool.  Genitourinary: Negative for dysuria and hematuria.  Musculoskeletal: Negative for back pain and gait problem.  Skin: Negative for rash.  Neurological: Positive for weakness. Negative for headaches.      Allergies  Flora-q  Home Medications   Prior to Admission medications   Medication Sig Start Date End Date Taking? Authorizing Provider  aspirin 81 MG tablet Take 81 mg by mouth daily.     Yes Historical Provider, MD  clonazePAM (KLONOPIN) 0.5 MG tablet Take 0.5 mg by mouth 3 (three) times daily as needed. For anxiety   Yes Historical Provider, MD  colesevelam (WELCHOL) 625 MG tablet Take 625 mg by mouth 2 (two) times daily with a meal.    Yes Historical Provider, MD  esomeprazole (NEXIUM) 40 MG capsule Take 1 capsule (40 mg total) by mouth daily at 12 noon. 06/15/13  Yes Milus Banister, MD  Multiple Vitamins-Minerals (MULTIVITAMIN GUMMIES ADULTS PO) Take 2 tablets by mouth daily.   Yes Historical Provider, MD  olmesartan-hydrochlorothiazide (BENICAR HCT) 40-12.5 MG per tablet Take 1 tablet by mouth daily.   Yes Historical Provider, MD  dicyclomine (BENTYL) 20 MG tablet Take 1 tablet (20 mg total) by mouth 2 (two) times daily. 01/14/14  Pura Spice, PA-C   BP 147/66  Pulse 71  Temp(Src) 97.6 F (36.4 C) (Oral)  Resp 18  SpO2 96% Physical Exam  Nursing note and vitals reviewed. Constitutional: She appears well-developed and well-nourished. No distress.  HENT:  Head: Normocephalic and atraumatic.  Eyes: Conjunctivae and EOM are normal. Pupils are equal, round, and reactive to light. Right eye exhibits no discharge. Left eye exhibits no discharge. No scleral icterus.  Cardiovascular: Normal rate, regular rhythm and normal heart sounds.   Pulmonary/Chest: Effort normal and  breath sounds normal. No respiratory distress. She has no wheezes.  Abdominal: Soft. Bowel sounds are normal.  Mild diffuse tenderness. No guarding or rebound. Positive BS. No midline or back tenderness. No CVA tenderness.   Musculoskeletal: Normal range of motion. She exhibits no tenderness.  Neurological: She is alert. No cranial nerve deficit. She exhibits normal muscle tone. Coordination normal.  Strength 5/5 in upper and lower extremities. Sensation intact. Intact rapid alternating movements, finger to nose, and heel to shin. Negative Romberg. Normal gait.   Skin: Skin is warm and dry. She is not diaphoretic.  Psychiatric: She has a normal mood and affect. Her behavior is normal.    ED Course  Procedures (including critical care time) Labs Review Labs Reviewed  CBC WITH DIFFERENTIAL - Abnormal; Notable for the following:    WBC 3.0 (*)    All other components within normal limits  COMPREHENSIVE METABOLIC PANEL - Abnormal; Notable for the following:    GFR calc non Af Amer 73 (*)    GFR calc Af Amer 85 (*)    All other components within normal limits  URINALYSIS, ROUTINE W REFLEX MICROSCOPIC - Abnormal; Notable for the following:    Hgb urine dipstick TRACE (*)    All other components within normal limits  URINE MICROSCOPIC-ADD ON  I-STAT TROPOININ, ED  POC OCCULT BLOOD, ED  POC OCCULT BLOOD, ED    Imaging Review No results found.   EKG Interpretation None     Meds given in ED:  Medications  ondansetron (ZOFRAN-ODT) disintegrating tablet 4 mg (4 mg Oral Given 01/14/14 1231)  gi cocktail (Maalox,Lidocaine,Donnatal) (15 mLs Oral Given 01/14/14 1231)    New Prescriptions   DICYCLOMINE (BENTYL) 20 MG TABLET    Take 1 tablet (20 mg total) by mouth 2 (two) times daily.      MDM   Final diagnoses:  IBS  Generalized abdominal pain   Patient with history of chronic abdominal pain with soreness after bowel prep one week ago along with her typical burning acid reflux  pain. Troponin and EKG normal. Pain better with GI cocktail. I suspect this is her typical GERD epigastric pain. Patient with IBS. I think the soreness is most likely due to the bowel prep. Patient is afebrile, nontoxic, nonseptic appearing, in no apparent distress.  Patient's pain and other symptoms adequately managed in emergency department.   Labs and vitals reviewed.  Patient does not meet the SIRS or Sepsis criteria.  On repeat exam patient does not have a surgical abdomen and there are no peritoneal signs.  No tenderness. No signs of infection on UA, negative hemoccult.  Patient discharged home with symptomatic treatment and given strict instructions for follow-up with their primary care physician.  tx with bentyl and metamucil. I have also discussed reasons to return immediately to the ER.  Patient expresses understanding and agrees with plan.  Discussed return precautions with patient. Discussed all results and patient verbalizes understanding and agrees  with plan.  This is a shared patient. This patient was discussed with the physician, Dr. Wilson Singer who saw and evaluated the patient and agrees with the plan.     Pura Spice, PA-C 01/14/14 [place], PA-C 01/14/14 1601

## 2014-01-14 NOTE — ED Notes (Addendum)
Pt reports centralized abdominal pain and left flank pain that radiates to the back, which she states she has history of IBS and diverticulosis. Pt states that the flank pain is new. Pt reports being seen at her PCP 01/06/2014 on and was told that she had large amounts of stool in her large intestine. Pt reports drinking a gallon of laxative solution, which produced large amounts of stool over three days. Pt states that the output did not go all the way to clear and states that she felt weak after completing the solution. Pt also states that she has intermittent chest pain with the sensation that her heart beats really fast, which has been going on for years.

## 2014-01-14 NOTE — Discharge Instructions (Signed)
Return to the emergency room with worsening of symptoms, new symptoms or with symptoms that are concerning, severe abdominal pain, nausea and vomiting, fevers, unable to keep fluids or food down. Please call your doctor for a followup appointment within 24-48 hours. When you talk to your doctor please let them know that you were seen in the emergency department and have them acquire all of your records so that they can discuss the findings with you and formulate a treatment plan to fully care for your new and ongoing problems. Take bentyl twice daily. Take metamucil daily and continue with miralax as need.  Abdominal Pain Many things can cause abdominal pain. Usually, abdominal pain is not caused by a disease and will improve without treatment. It can often be observed and treated at home. Your health care provider will do a physical exam and possibly order blood tests and X-rays to help determine the seriousness of your pain. However, in many cases, more time must pass before a clear cause of the pain can be found. Before that point, your health care provider may not know if you need more testing or further treatment. HOME CARE INSTRUCTIONS  Monitor your abdominal pain for any changes. The following actions may help to alleviate any discomfort you are experiencing:  Only take over-the-counter or prescription medicines as directed by your health care provider.  Do not take laxatives unless directed to do so by your health care provider.  Try a clear liquid diet (broth, tea, or water) as directed by your health care provider. Slowly move to a bland diet as tolerated. SEEK MEDICAL CARE IF:  You have unexplained abdominal pain.  You have abdominal pain associated with nausea or diarrhea.  You have pain when you urinate or have a bowel movement.  You experience abdominal pain that wakes you in the night.  You have abdominal pain that is worsened or improved by eating food.  You have abdominal  pain that is worsened with eating fatty foods.  You have a fever. SEEK IMMEDIATE MEDICAL CARE IF:   Your pain does not go away within 2 hours.  You keep throwing up (vomiting).  Your pain is felt only in portions of the abdomen, such as the right side or the left lower portion of the abdomen.  You pass bloody or black tarry stools. MAKE SURE YOU:  Understand these instructions.   Will watch your condition.   Will get help right away if you are not doing well or get worse.  Document Released: 01/29/2005 Document Revised: 04/26/2013 Document Reviewed: 12/29/2012 [facility] Patient Information 2015 [place], [place]. This information is not intended to replace advice given to you by your health care provider. Make sure you discuss any questions you have with your health care provider.

## 2014-01-18 NOTE — ED Provider Notes (Signed)
Medical screening examination/treatment/procedure(s) were conducted as a shared visit with non-physician practitioner(s) and myself.  I personally evaluated the patient during the encounter.   EKG Interpretation   Date/Time:  Saturday January 14 2014 13:33:27 EDT Ventricular Rate:  73 PR Interval:  168 QRS Duration: 70 QT Interval:  395 QTC Calculation: 435 R Axis:   19 Text Interpretation:  Sinus rhythm Prominent P waves, nondiagnostic ED  PHYSICIAN INTERPRETATION AVAILABLE IN [facility] Confirmed by TEST,  Record ([id number]) on 01/16/2014 6:56:12 AM     89yF with abdominal pain. Her exam is benign though. Potentially IBS or her chronic pain? Low suspicion for emergent process. Feeling better. W/u fairly unremarkable. I feel safe for DC.   Virgel Manifold, MD 01/18/14 [phone]

## 2014-03-08 ENCOUNTER — Emergency Department (HOSPITAL_COMMUNITY): Payer: Commercial Managed Care - HMO

## 2014-03-08 ENCOUNTER — Emergency Department (HOSPITAL_COMMUNITY)
Admission: EM | Admit: 2014-03-08 | Discharge: 2014-03-08 | Disposition: A | Discharge status: Home / Self Care | Payer: Commercial Managed Care - HMO | Attending: Emergency Medicine | Admitting: Emergency Medicine

## 2014-03-08 ENCOUNTER — Encounter (HOSPITAL_COMMUNITY): Payer: Self-pay

## 2014-03-08 DIAGNOSIS — R109 Unspecified abdominal pain: Secondary | ICD-10-CM | POA: Diagnosis not present

## 2014-03-08 DIAGNOSIS — Z7982 Long term (current) use of aspirin: Secondary | ICD-10-CM | POA: Insufficient documentation

## 2014-03-08 DIAGNOSIS — K219 Gastro-esophageal reflux disease without esophagitis: Secondary | ICD-10-CM | POA: Insufficient documentation

## 2014-03-08 DIAGNOSIS — I10 Essential (primary) hypertension: Secondary | ICD-10-CM | POA: Diagnosis not present

## 2014-03-08 DIAGNOSIS — Z79899 Other long term (current) drug therapy: Secondary | ICD-10-CM | POA: Insufficient documentation

## 2014-03-08 DIAGNOSIS — R079 Chest pain, unspecified: Secondary | ICD-10-CM | POA: Diagnosis present

## 2014-03-08 DIAGNOSIS — E785 Hyperlipidemia, unspecified: Secondary | ICD-10-CM | POA: Diagnosis not present

## 2014-03-08 DIAGNOSIS — K589 Irritable bowel syndrome without diarrhea: Secondary | ICD-10-CM | POA: Diagnosis not present

## 2014-03-08 DIAGNOSIS — Z9049 Acquired absence of other specified parts of digestive tract: Secondary | ICD-10-CM | POA: Diagnosis not present

## 2014-03-08 LAB — CBC WITH DIFFERENTIAL/PLATELET
BASOS PCT: 0 % (ref 0–1)
Basophils Absolute: 0 10*3/uL (ref 0.0–0.1)
Eosinophils Absolute: 0.1 10*3/uL (ref 0.0–0.7)
Eosinophils Relative: 2 % (ref 0–5)
HEMATOCRIT: 38.9 % (ref 36.0–46.0)
Hemoglobin: 12.6 g/dL (ref 12.0–15.0)
LYMPHS ABS: 0.9 10*3/uL (ref 0.7–4.0)
LYMPHS PCT: 29 % (ref 12–46)
MCH: 28.3 pg (ref 26.0–34.0)
MCHC: 32.4 g/dL (ref 30.0–36.0)
MCV: 87.4 fL (ref 78.0–100.0)
MONO ABS: 0.3 10*3/uL (ref 0.1–1.0)
MONOS PCT: 9 % (ref 3–12)
NEUTROS ABS: 1.9 10*3/uL (ref 1.7–7.7)
NEUTROS PCT: 60 % (ref 43–77)
Platelets: 225 10*3/uL (ref 150–400)
RBC: 4.45 MIL/uL (ref 3.87–5.11)
RDW: 13.3 % (ref 11.5–15.5)
WBC: 3.1 10*3/uL — AB (ref 4.0–10.5)

## 2014-03-08 LAB — URINALYSIS, ROUTINE W REFLEX MICROSCOPIC
BILIRUBIN URINE: NEGATIVE
GLUCOSE, UA: NEGATIVE mg/dL
KETONES UR: NEGATIVE mg/dL
NITRITE: NEGATIVE
Protein, ur: NEGATIVE mg/dL
Specific Gravity, Urine: 1.007 (ref 1.005–1.030)
Urobilinogen, UA: 0.2 mg/dL (ref 0.0–1.0)
pH: 6.5 (ref 5.0–8.0)

## 2014-03-08 LAB — COMPREHENSIVE METABOLIC PANEL
ALT: 12 U/L (ref 0–35)
ANION GAP: 11 (ref 5–15)
AST: 21 U/L (ref 0–37)
Albumin: 3.9 g/dL (ref 3.5–5.2)
Alkaline Phosphatase: 85 U/L (ref 39–117)
BILIRUBIN TOTAL: 0.4 mg/dL (ref 0.3–1.2)
BUN: 9 mg/dL (ref 6–23)
CHLORIDE: 103 meq/L (ref 96–112)
CO2: 27 meq/L (ref 19–32)
Calcium: 9.8 mg/dL (ref 8.4–10.5)
Creatinine, Ser: 0.91 mg/dL (ref 0.50–1.10)
GFR calc Af Amer: 62 mL/min — ABNORMAL LOW (ref >90)
GFR, EST NON AFRICAN AMERICAN: 54 mL/min — AB (ref >90)
GLUCOSE: 96 mg/dL (ref 70–99)
POTASSIUM: 4 meq/L (ref 3.7–5.3)
Sodium: 141 mEq/L (ref 137–147)
Total Protein: 7.5 g/dL (ref 6.0–8.3)

## 2014-03-08 LAB — I-STAT TROPONIN, ED
TROPONIN I, POC: 0 ng/mL (ref 0.00–0.08)
TROPONIN I, POC: 0.01 ng/mL (ref 0.00–0.08)

## 2014-03-08 LAB — I-STAT CG4 LACTIC ACID, ED: Lactic Acid, Venous: 0.67 mmol/L (ref 0.5–2.2)

## 2014-03-08 LAB — URINE MICROSCOPIC-ADD ON

## 2014-03-08 LAB — LIPASE, BLOOD: Lipase: 14 U/L (ref 11–59)

## 2014-03-08 MED ORDER — ASPIRIN 81 MG PO CHEW
324.0000 mg | CHEWABLE_TABLET | Freq: Once | ORAL | Status: AC
  Administered 2014-03-08: 324 mg via ORAL
  Filled 2014-03-08: qty 4

## 2014-03-08 MED ORDER — IOHEXOL 300 MG/ML  SOLN
100.0000 mL | Freq: Once | INTRAMUSCULAR | Status: AC | PRN
  Administered 2014-03-08: 100 mL via INTRAVENOUS

## 2014-03-08 MED ORDER — IOHEXOL 300 MG/ML  SOLN
50.0000 mL | Freq: Once | INTRAMUSCULAR | Status: AC | PRN
  Administered 2014-03-08: 50 mL via ORAL

## 2014-03-08 NOTE — ED Notes (Signed)
Per pt, abdominal pain x weeks.  Pt states chest pain starting today.  Chest pain in center of chest.  No nausea/vomiting.  No change in stools

## 2014-03-08 NOTE — ED Provider Notes (Signed)
CSN: [id number]     Arrival date & time 03/08/14  1043 History   First MD Initiated Contact with Patient 03/08/14 1100     Chief Complaint  Patient presents with  . Chest Pain  . Abdominal Pain     (Consider location/radiation/quality/duration/timing/severity/associated sxs/prior Treatment) HPI  This is a 80 year old female with history of IBS, diverticulosis, reflux who presents with abdominal pain and chest pain. Patient reports weeks of "my stomach hurts." She reports pain is worse after eating and at night. She states that the pain is crampy and usually related to the type of food that she eats. She denies any diarrhea or vomiting. Denies abdominal pain at this time.  She states that this morning she woke up and had right sided chest "tightness." She denies any chest pain right now.  She does continue to report tightness which is 2 out of 10. She denies any diaphoresis or shortness of breath. She denies any leg swelling. She denies any cough or fevers. No known history of coronary artery disease. Denies history of hypertension, hypercholesterolemia, early family history of heart disease. She is not currently a smoker.  Patient sees Dr. Paulita Fujita with GI.  Past Medical History  Diagnosis Date  . Irritable bowel syndrome   . Diverticulosis of colon (without mention of hemorrhage)   . Other and unspecified hyperlipidemia   . Unspecified essential hypertension   . Diaphragmatic hernia without mention of obstruction or gangrene   . Esophageal reflux   . Ventral hernia, unspecified, without mention of obstruction or gangrene    Past Surgical History  Procedure Laterality Date  . Hiatal hernia repair    . Cholecystectomy     Family History  Problem Relation Age of Onset  . Colon cancer Neg Hx   . Breast cancer Daughter 69   History  Substance Use Topics  . Smoking status: Never Smoker   . Smokeless tobacco: Never Used  . Alcohol Use: No   OB History    No data available      Review of Systems  Constitutional: Negative for fever.  Respiratory: Positive for chest tightness. Negative for cough and shortness of breath.   Cardiovascular: Negative for chest pain and leg swelling.  Gastrointestinal: Positive for abdominal pain. Negative for nausea, vomiting, diarrhea and blood in stool.  Genitourinary: Negative for dysuria.  Musculoskeletal: Negative for back pain.  Neurological: Negative for headaches.  Psychiatric/Behavioral: Negative for confusion.  All other systems reviewed and are negative.     Allergies  Flora-q  Home Medications   Prior to Admission medications   Medication Sig Start Date End Date Taking? Authorizing Provider  aspirin 81 MG tablet Take 81 mg by mouth daily.     Yes Historical Provider, MD  clonazePAM (KLONOPIN) 0.5 MG tablet Take 0.5 mg by mouth 3 (three) times daily as needed. For anxiety   Yes Historical Provider, MD  colesevelam (WELCHOL) 625 MG tablet Take 625 mg by mouth daily.    Yes Historical Provider, MD  dicyclomine (BENTYL) 20 MG tablet Take 1 tablet (20 mg total) by mouth 2 (two) times daily. 01/14/14  Yes Pura Spice, PA-C  esomeprazole (NEXIUM) 40 MG capsule Take 1 capsule (40 mg total) by mouth daily at 12 noon. 06/15/13  Yes Milus Banister, MD  lisinopril (PRINIVIL,ZESTRIL) 20 MG tablet Take 20 mg by mouth every other day. Tuesday, Thursday, and Saturday   Yes Historical Provider, MD  Multiple Vitamins-Minerals (MULTIVITAMIN GUMMIES ADULTS PO) Take 2 tablets  by mouth daily.   Yes Historical Provider, MD  olmesartan-hydrochlorothiazide (BENICAR HCT) 40-12.5 MG per tablet Take 1 tablet by mouth every other day. Monday, Wednesday, Friday, Sunday   Yes Historical Provider, MD  polyethylene glycol (MIRALAX / GLYCOLAX) packet Take 17 g by mouth daily.   Yes Historical Provider, MD   BP 149/70 mmHg  Pulse 72  Temp(Src) 97.5 F (36.4 C) (Oral)  Resp 13  Ht 5' (1.524 m)  Wt 140 lb (63.504 kg)  BMI 27.34 kg/m2  SpO2  100% Physical Exam  Constitutional: She is oriented to person, place, and time. No distress.  Appears younger than stated age  HENT:  Head: Normocephalic and atraumatic.  Eyes: Pupils are equal, round, and reactive to light.  Cardiovascular: Normal rate, regular rhythm and normal heart sounds.   No murmur heard. Pulmonary/Chest: Effort normal and breath sounds normal. No respiratory distress. She has no wheezes. She exhibits tenderness.  Abdominal: Soft. Bowel sounds are normal. There is no tenderness. There is no rebound and no guarding.  Musculoskeletal: She exhibits no edema.  Neurological: She is alert and oriented to person, place, and time.  Skin: Skin is warm and dry.  Psychiatric: She has a normal mood and affect.  Nursing note and vitals reviewed.   ED Course  Procedures (including critical care time) Labs Review Labs Reviewed  CBC WITH DIFFERENTIAL - Abnormal; Notable for the following:    WBC 3.1 (*)    All other components within normal limits  COMPREHENSIVE METABOLIC PANEL - Abnormal; Notable for the following:    GFR calc non Af Amer 54 (*)    GFR calc Af Amer 62 (*)    All other components within normal limits  URINALYSIS, ROUTINE W REFLEX MICROSCOPIC - Abnormal; Notable for the following:    Hgb urine dipstick TRACE (*)    Leukocytes, UA TRACE (*)    All other components within normal limits  LIPASE, BLOOD  URINE MICROSCOPIC-ADD ON  I-STAT CG4 LACTIC ACID, ED  I-STAT TROPOININ, ED  Randolm Idol, ED    Imaging Review Dg Abd Acute W/chest  03/08/2014   CLINICAL DATA:  80 year old female with acute chest pain. Intermittent abdominal pain. Personal history of hiatal hernia repair and cholecystectomy. Initial encounter.  EXAM: ACUTE ABDOMEN SERIES (ABDOMEN 2 VIEW & CHEST 1 VIEW)  COMPARISON:  Abdomen radiographs 12/2013 and earlier. Chest radiographs 04/06/2012.  FINDINGS: Improved lung volumes. Stable cardiac size at the upper limits of normal. Other  mediastinal contours are within normal limits. Visualized tracheal air column is within normal limits. No pneumothorax or pneumoperitoneum. No acute pulmonary opacity.  Stable cholecystectomy clips. Non obstructed bowel gas pattern. Abdominal and pelvic visceral contours are stable. Osteopenia. No acute osseous abnormality identified.  IMPRESSION: 1.  Non obstructed bowel gas pattern, no free air. 2.  No acute cardiopulmonary abnormality.   Electronically Signed   By: Lars Pinks M.D.   On: 03/08/2014 12:16     EKG Interpretation   Date/Time:  Wednesday March 08 2014 10:55:47 EST Ventricular Rate:  82 PR Interval:  150 QRS Duration: 66 QT Interval:  365 QTC Calculation: 426 R Axis:   11 Text Interpretation:  Sinus rhythm Consider left atrial enlargement  Similar to prior Confirmed by Kimmerly Lora  MD, Gem ([id number]) on 03/08/2014  10:58:42 AM      MDM   Final diagnoses:  Chest pain  Abdominal pain    Patient presents with chest and abdominal pain. She is nontoxic and afebrile. Nontender  on exam. History of abdominal pain is chronic in nature. Patient is concerned that "nothing is helping." She is on a PPI at home. Initial lab work obtained including acute abdominal series. Regarding patient's chest pain, it is atypical in nature. EKG is nonischemic. And delta troponin to the ER are reassuring.  Lab workup and imaging is reassuring. On recheck, patient is continually concerned about her abdominal pain. I discussed with her close follow-up with Dr. Paulita Fujita. She states that the GI office told her to come to the ER because her pain was worsening. While she has a nontender abdomen, given her age and the persistence of pain, will obtain a CT scan. If negative, patient will need to follow-up with Dr. Paulita Fujita.    Merryl Hacker, MD 03/08/14 [phone]

## 2014-03-08 NOTE — Discharge Instructions (Signed)
Return to the ED with any concerns including worsening pain, vomiting and not able to keep down liquids, fainting, difficulty breathing, decreased level of alertness/lethargy, or any other alarming symptoms

## 2014-04-06 ENCOUNTER — Emergency Department (HOSPITAL_COMMUNITY)
Admission: EM | Admit: 2014-04-06 | Discharge: 2014-04-06 | Disposition: A | Discharge status: Home / Self Care | Payer: Commercial Managed Care - HMO | Attending: Emergency Medicine | Admitting: Emergency Medicine

## 2014-04-06 ENCOUNTER — Emergency Department (HOSPITAL_COMMUNITY): Payer: Commercial Managed Care - HMO

## 2014-04-06 ENCOUNTER — Encounter (HOSPITAL_COMMUNITY): Payer: Self-pay | Admitting: Emergency Medicine

## 2014-04-06 DIAGNOSIS — K589 Irritable bowel syndrome without diarrhea: Secondary | ICD-10-CM | POA: Insufficient documentation

## 2014-04-06 DIAGNOSIS — Z7982 Long term (current) use of aspirin: Secondary | ICD-10-CM | POA: Diagnosis not present

## 2014-04-06 DIAGNOSIS — Z8639 Personal history of other endocrine, nutritional and metabolic disease: Secondary | ICD-10-CM | POA: Insufficient documentation

## 2014-04-06 DIAGNOSIS — Z79899 Other long term (current) drug therapy: Secondary | ICD-10-CM | POA: Diagnosis not present

## 2014-04-06 DIAGNOSIS — Z9089 Acquired absence of other organs: Secondary | ICD-10-CM | POA: Insufficient documentation

## 2014-04-06 DIAGNOSIS — R109 Unspecified abdominal pain: Secondary | ICD-10-CM | POA: Insufficient documentation

## 2014-04-06 DIAGNOSIS — K219 Gastro-esophageal reflux disease without esophagitis: Secondary | ICD-10-CM | POA: Insufficient documentation

## 2014-04-06 LAB — CBC WITH DIFFERENTIAL/PLATELET
BASOS ABS: 0 10*3/uL (ref 0.0–0.1)
Basophils Relative: 1 % (ref 0–1)
EOS ABS: 0.1 10*3/uL (ref 0.0–0.7)
Eosinophils Relative: 2 % (ref 0–5)
HCT: 35.8 % — ABNORMAL LOW (ref 36.0–46.0)
Hemoglobin: 11.8 g/dL — ABNORMAL LOW (ref 12.0–15.0)
Lymphocytes Relative: 34 % (ref 12–46)
Lymphs Abs: 1.3 10*3/uL (ref 0.7–4.0)
MCH: 28.8 pg (ref 26.0–34.0)
MCHC: 33 g/dL (ref 30.0–36.0)
MCV: 87.3 fL (ref 78.0–100.0)
Monocytes Absolute: 0.3 10*3/uL (ref 0.1–1.0)
Monocytes Relative: 8 % (ref 3–12)
NEUTROS PCT: 55 % (ref 43–77)
Neutro Abs: 2.1 10*3/uL (ref 1.7–7.7)
PLATELETS: 234 10*3/uL (ref 150–400)
RBC: 4.1 MIL/uL (ref 3.87–5.11)
RDW: 13.4 % (ref 11.5–15.5)
WBC: 3.9 10*3/uL — ABNORMAL LOW (ref 4.0–10.5)

## 2014-04-06 LAB — COMPREHENSIVE METABOLIC PANEL
ALBUMIN: 3.8 g/dL (ref 3.5–5.2)
ALK PHOS: 82 U/L (ref 39–117)
ALT: 12 U/L (ref 0–35)
AST: 19 U/L (ref 0–37)
Anion gap: 12 (ref 5–15)
BUN: 10 mg/dL (ref 6–23)
CO2: 26 mEq/L (ref 19–32)
Calcium: 9.4 mg/dL (ref 8.4–10.5)
Chloride: 95 mEq/L — ABNORMAL LOW (ref 96–112)
Creatinine, Ser: 1.09 mg/dL (ref 0.50–1.10)
GFR calc Af Amer: 50 mL/min — ABNORMAL LOW (ref >90)
GFR calc non Af Amer: 43 mL/min — ABNORMAL LOW (ref >90)
Glucose, Bld: 89 mg/dL (ref 70–99)
Potassium: 3.8 mEq/L (ref 3.7–5.3)
Sodium: 133 mEq/L — ABNORMAL LOW (ref 137–147)
TOTAL PROTEIN: 7.2 g/dL (ref 6.0–8.3)
Total Bilirubin: 0.4 mg/dL (ref 0.3–1.2)

## 2014-04-06 LAB — URINALYSIS, ROUTINE W REFLEX MICROSCOPIC
Bilirubin Urine: NEGATIVE
Glucose, UA: NEGATIVE mg/dL
Ketones, ur: NEGATIVE mg/dL
NITRITE: NEGATIVE
PH: 7 (ref 5.0–8.0)
Protein, ur: NEGATIVE mg/dL
SPECIFIC GRAVITY, URINE: 1.001 — AB (ref 1.005–1.030)
Urobilinogen, UA: 0.2 mg/dL (ref 0.0–1.0)

## 2014-04-06 LAB — URINE MICROSCOPIC-ADD ON

## 2014-04-06 LAB — LIPASE, BLOOD: Lipase: 17 U/L (ref 11–59)

## 2014-04-06 LAB — I-STAT TROPONIN, ED: Troponin i, poc: 0 ng/mL (ref 0.00–0.08)

## 2014-04-06 MED ORDER — TRAMADOL HCL 50 MG PO TABS: 50.0000 mg | ORAL_TABLET | Freq: Four times a day (QID) | ORAL | Status: DC | PRN

## 2014-04-06 MED ORDER — ONDANSETRON 8 MG PO TBDP
ORAL_TABLET | ORAL | Status: AC
  Filled 2014-04-06: qty 1

## 2014-04-06 MED ORDER — SENNOSIDES-DOCUSATE SODIUM 8.6-50 MG PO TABS: 2.0000 | ORAL_TABLET | Freq: Every day | ORAL | Status: DC

## 2014-04-06 MED ORDER — ONDANSETRON HCL 4 MG/2ML IJ SOLN
4.0000 mg | Freq: Once | INTRAMUSCULAR | Status: AC
  Administered 2014-04-06: 4 mg via INTRAVENOUS
  Filled 2014-04-06: qty 2

## 2014-04-06 MED ORDER — SODIUM CHLORIDE 0.9 % IV SOLN
1000.0000 mL | Freq: Once | INTRAVENOUS | Status: AC
  Administered 2014-04-06: 1000 mL via INTRAVENOUS

## 2014-04-06 MED ORDER — FENTANYL CITRATE 0.05 MG/ML IJ SOLN
25.0000 ug | Freq: Once | INTRAMUSCULAR | Status: AC
  Administered 2014-04-06: 25 ug via INTRAVENOUS
  Filled 2014-04-06: qty 2

## 2014-04-06 MED ORDER — ONDANSETRON 8 MG PO TBDP
8.0000 mg | ORAL_TABLET | Freq: Once | ORAL | Status: AC
  Administered 2014-04-06: 8 mg via ORAL

## 2014-04-06 NOTE — Progress Notes (Signed)
CSW met with Pt at bedside. Grandson was at bedsidePerlie Gold [phone]). Pt says she is here because of abdominal pain. Pt informed CSW that she lives at home alone is able to do here ADL's independently. Pt says that she eventually plans to move in with her son.  Willette Brace [phone] ED CSW 04/06/2014 9:43 PM

## 2014-04-06 NOTE — ED Provider Notes (Signed)
CSN: [id number]     Arrival date & time 04/06/14  2021 History   First MD Initiated Contact with Patient 04/06/14 2032     Chief Complaint  Patient presents with  . Abdominal Pain  . Nausea     (Consider location/radiation/quality/duration/timing/severity/associated sxs/prior Treatment) HPI Patient presents with concern of abdominal pain. Over the past week she has had more severe episodes of pain in the upper and lower abdomen.  Pain is inconsistent, with no clear alleviating or exacerbating factors.  Pain is sore, severe. There is no lateral pain, no vomiting.  There is nausea.  No diarrhea.  No dysuria.  No fever, chills.  Last bowel movement was yesterday.  Patient has a history of IBS, no recent exacerbations.  She did recently start on any medication with her gastroenterologist,  Past Medical History  Diagnosis Date  . Irritable bowel syndrome   . Diverticulosis of colon (without mention of hemorrhage)   . Other and unspecified hyperlipidemia   . Unspecified essential hypertension   . Diaphragmatic hernia without mention of obstruction or gangrene   . Esophageal reflux   . Ventral hernia, unspecified, without mention of obstruction or gangrene    Past Surgical History  Procedure Laterality Date  . Hiatal hernia repair    . Cholecystectomy     Family History  Problem Relation Age of Onset  . Colon cancer Neg Hx   . Breast cancer Daughter 41   History  Substance Use Topics  . Smoking status: Never Smoker   . Smokeless tobacco: Never Used  . Alcohol Use: No   OB History    No data available     Review of Systems  Constitutional:       Per HPI, otherwise negative  HENT:       Per HPI, otherwise negative  Respiratory:       Per HPI, otherwise negative  Cardiovascular:       Per HPI, otherwise negative  Gastrointestinal: Negative for vomiting.  Endocrine:       Negative aside from HPI  Genitourinary:       Neg aside from HPI   Musculoskeletal:       Per  HPI, otherwise negative  Skin: Negative.   Neurological: Negative for syncope.      Allergies  Flora-q  Home Medications   Prior to Admission medications   Medication Sig Start Date End Date Taking? Authorizing Provider  aspirin 81 MG tablet Take 81 mg by mouth daily.     Yes Historical Provider, MD  bismuth subsalicylate (PEPTO BISMOL) 262 MG/15ML suspension Take 5 mLs by mouth every 6 (six) hours as needed for indigestion (indigestion).   Yes Historical Provider, MD  clonazePAM (KLONOPIN) 0.5 MG tablet Take 0.5 mg by mouth 3 (three) times daily as needed for anxiety (anxiety). For anxiety   Yes Historical Provider, MD  colesevelam (WELCHOL) 625 MG tablet Take 625 mg by mouth daily.    Yes Historical Provider, MD  esomeprazole (NEXIUM) 40 MG capsule Take 1 capsule (40 mg total) by mouth daily at 12 noon. Patient taking differently: Take 40 mg by mouth 2 (two) times daily before a meal.  06/15/13  Yes Milus Banister, MD  lisinopril (PRINIVIL,ZESTRIL) 20 MG tablet Take 20 mg by mouth every other day. Tuesday, Thursday, and Saturday   Yes Historical Provider, MD  lubiprostone (AMITIZA) 8 MCG capsule Take 8 mcg by mouth 2 (two) times daily with a meal.   Yes Historical Provider, MD  Multiple Vitamins-Minerals (MULTIVITAMIN GUMMIES ADULTS PO) Take 2 tablets by mouth daily.   Yes Historical Provider, MD  olmesartan-hydrochlorothiazide (BENICAR HCT) 40-12.5 MG per tablet Take 1 tablet by mouth every other day. Monday, Wednesday, Friday, Sunday   Yes Historical Provider, MD  dicyclomine (BENTYL) 20 MG tablet Take 1 tablet (20 mg total) by mouth 2 (two) times daily. 01/14/14   Pura Spice, PA-C  polyethylene glycol (MIRALAX / GLYCOLAX) packet Take 17 g by mouth daily.    Historical Provider, MD   BP 150/64 mmHg  Pulse 76  Temp(Src) 97.8 F (36.6 C) (Oral)  Resp 18  SpO2 98% Physical Exam  Constitutional: She is oriented to person, place, and time. She appears well-developed and  well-nourished. No distress.  HENT:  Head: Normocephalic and atraumatic.  Eyes: Conjunctivae and EOM are normal.  Cardiovascular: Normal rate and regular rhythm.   Pulmonary/Chest: Effort normal and breath sounds normal. No stridor. No respiratory distress.  Abdominal: She exhibits no distension.  Minimal midline discomfort with palpation, no peritoneal findings  Musculoskeletal: She exhibits no edema.  Neurological: She is alert and oriented to person, place, and time. She exhibits normal muscle tone. Coordination normal.  Patient has poor hearing, but otherwise no gross neurologic deficits.  Skin: Skin is warm and dry.  Psychiatric: She has a normal mood and affect. Her behavior is normal.  Nursing note and vitals reviewed.   ED Course  Procedures (including critical care time) Labs Review Labs Reviewed  CBC WITH DIFFERENTIAL - Abnormal; Notable for the following:    WBC 3.9 (*)    Hemoglobin 11.8 (*)    HCT 35.8 (*)    All other components within normal limits  URINALYSIS, ROUTINE W REFLEX MICROSCOPIC - Abnormal; Notable for the following:    Specific Gravity, Urine 1.001 (*)    Hgb urine dipstick SMALL (*)    Leukocytes, UA TRACE (*)    All other components within normal limits  URINE MICROSCOPIC-ADD ON  COMPREHENSIVE METABOLIC PANEL  LIPASE, BLOOD  I-STAT TROPOININ, ED    Imaging Review Dg Abd Acute W/chest  04/06/2014   CLINICAL DATA:  Chronic abdominal pain and nausea. Initial encounter.  EXAM: ACUTE ABDOMEN SERIES (ABDOMEN 2 VIEW & CHEST 1 VIEW)  COMPARISON:  03/08/2014 and prior imaging  FINDINGS: Cardiomegaly again noted.  There is no evidence of airspace disease, pleural effusion or pneumothorax.  Bowel gas pattern is unremarkable.  There is no evidence of bowel obstruction or pneumoperitoneum.  Cholecystectomy clips are present.  No suspicious calcifications are identified.  No acute bony abnormalities are noted.  IMPRESSION: No evidence of acute abnormality.   Unremarkable bowel gas pattern.  Cardiomegaly.   Electronically Signed   By: Hassan Rowan M.D.   On: 04/06/2014 21:37    On repeat exam the patient is asleep.  When awakened, she is informed of all results. We discussed his evaluation, return precautions, need to follow-up with her primary care and gastroenterology physicians. The patient requested assistance with her constipation, and this was provided. She was instructed to continue taking her new IBS medication in addition to all medications.   MDM   Final diagnoses:  Abdominal pain    This patient with history of IBS presents with intermittent, and consistent abdominal pain.  No evidence for peritonitis, obstruction on physical exam, and labs, x-ray are all reassuring. Patient was asleep on repeat exam. With no concerning findings, and only the recent initiation of new medication for IBS control, she was  discharged in stable condition with additional medication for constipation, to follow-up with gastroenterology.    Carmin Muskrat, MD 04/07/14 [phone]

## 2014-04-06 NOTE — Discharge Instructions (Signed)
As discussed, your evaluation today has been largely reassuring.  But, it is important that you monitor your condition carefully, and do not hesitate to return to the ED if you develop new, or concerning changes in your condition. ° °Otherwise, please follow-up with your physician for appropriate ongoing care. ° ° °Abdominal Pain °Many things can cause abdominal pain. Usually, abdominal pain is not caused by a disease and will improve without treatment. It can often be observed and treated at home. Your health care provider will do a physical exam and possibly order blood tests and X-rays to help determine the seriousness of your pain. However, in many cases, more time must pass before a clear cause of the pain can be found. Before that point, your health care provider may not know if you need more testing or further treatment. °HOME CARE INSTRUCTIONS  °Monitor your abdominal pain for any changes. The following actions may help to alleviate any discomfort you are experiencing: °· Only take over-the-counter or prescription medicines as directed by your health care provider. °· Do not take laxatives unless directed to do so by your health care provider. °· Try a clear liquid diet (broth, tea, or water) as directed by your health care provider. Slowly move to a bland diet as tolerated. °SEEK MEDICAL CARE IF: °· You have unexplained abdominal pain. °· You have abdominal pain associated with nausea or diarrhea. °· You have pain when you urinate or have a bowel movement. °· You experience abdominal pain that wakes you in the night. °· You have abdominal pain that is worsened or improved by eating food. °· You have abdominal pain that is worsened with eating fatty foods. °· You have a fever. °SEEK IMMEDIATE MEDICAL CARE IF:  °· Your pain does not go away within 2 hours. °· You keep throwing up (vomiting). °· Your pain is felt only in portions of the abdomen, such as the right side or the left lower portion of the  abdomen. °· You pass bloody or black tarry stools. °MAKE SURE YOU: °· Understand these instructions.   °· Will watch your condition.   °· Will get help right away if you are not doing well or get worse.   °Document Released: 01/29/2005 Document Revised: 04/26/2013 Document Reviewed: 12/29/2012 °[employer]® Patient Information ©2015 [employer]. This information is not intended to replace advice given to you by your health care provider. Make sure you discuss any questions you have with your health care provider. ° °

## 2014-04-06 NOTE — ED Notes (Signed)
Pt states she is having abd pain for "some time"  Pt states every time she eats it makes her sick  Pt is c/o nausea without vomiting but states she has been gagging  Pt states she was seen her about a month or so ago and has seen her dr since then for same

## 2014-04-06 NOTE — ED Notes (Signed)
Placed patient on 2L [place] due to pain medicine decreasing patients O2 level

## 2014-05-09 DIAGNOSIS — I10 Essential (primary) hypertension: Secondary | ICD-10-CM | POA: Diagnosis not present

## 2014-05-09 DIAGNOSIS — E785 Hyperlipidemia, unspecified: Secondary | ICD-10-CM | POA: Diagnosis not present

## 2014-06-01 DIAGNOSIS — Z1212 Encounter for screening for malignant neoplasm of rectum: Secondary | ICD-10-CM | POA: Diagnosis not present

## 2014-06-06 ENCOUNTER — Other Ambulatory Visit: Payer: Self-pay | Admitting: Obstetrics and Gynecology

## 2014-06-06 DIAGNOSIS — Z779 Other contact with and (suspected) exposures hazardous to health: Secondary | ICD-10-CM | POA: Diagnosis not present

## 2014-06-06 DIAGNOSIS — Z6828 Body mass index (BMI) 28.0-28.9, adult: Secondary | ICD-10-CM | POA: Diagnosis not present

## 2014-06-07 LAB — CYTOLOGY - PAP

## 2014-06-15 DIAGNOSIS — H0012 Chalazion right lower eyelid: Secondary | ICD-10-CM | POA: Diagnosis not present

## 2014-07-26 DIAGNOSIS — K2970 Gastritis, unspecified, without bleeding: Secondary | ICD-10-CM | POA: Diagnosis not present

## 2014-07-26 DIAGNOSIS — Z780 Asymptomatic menopausal state: Secondary | ICD-10-CM | POA: Diagnosis not present

## 2014-07-26 DIAGNOSIS — Z6828 Body mass index (BMI) 28.0-28.9, adult: Secondary | ICD-10-CM | POA: Diagnosis not present

## 2014-08-14 DIAGNOSIS — M25511 Pain in right shoulder: Secondary | ICD-10-CM | POA: Diagnosis not present

## 2014-08-14 DIAGNOSIS — Z6828 Body mass index (BMI) 28.0-28.9, adult: Secondary | ICD-10-CM | POA: Diagnosis not present

## 2014-08-25 ENCOUNTER — Emergency Department (HOSPITAL_COMMUNITY): Payer: Commercial Managed Care - HMO

## 2014-08-25 ENCOUNTER — Encounter (HOSPITAL_COMMUNITY): Payer: Self-pay

## 2014-08-25 ENCOUNTER — Emergency Department (HOSPITAL_COMMUNITY)
Admission: EM | Admit: 2014-08-25 | Discharge: 2014-08-25 | Disposition: A | Discharge status: Home / Self Care | Payer: Commercial Managed Care - HMO | Attending: Emergency Medicine | Admitting: Emergency Medicine

## 2014-08-25 DIAGNOSIS — Z8639 Personal history of other endocrine, nutritional and metabolic disease: Secondary | ICD-10-CM | POA: Insufficient documentation

## 2014-08-25 DIAGNOSIS — R109 Unspecified abdominal pain: Secondary | ICD-10-CM | POA: Diagnosis not present

## 2014-08-25 DIAGNOSIS — Z79899 Other long term (current) drug therapy: Secondary | ICD-10-CM | POA: Insufficient documentation

## 2014-08-25 DIAGNOSIS — R42 Dizziness and giddiness: Secondary | ICD-10-CM | POA: Diagnosis not present

## 2014-08-25 DIAGNOSIS — Z7982 Long term (current) use of aspirin: Secondary | ICD-10-CM | POA: Diagnosis not present

## 2014-08-25 DIAGNOSIS — R51 Headache: Secondary | ICD-10-CM | POA: Insufficient documentation

## 2014-08-25 DIAGNOSIS — I10 Essential (primary) hypertension: Secondary | ICD-10-CM | POA: Insufficient documentation

## 2014-08-25 DIAGNOSIS — R519 Headache, unspecified: Secondary | ICD-10-CM

## 2014-08-25 DIAGNOSIS — K2970 Gastritis, unspecified, without bleeding: Secondary | ICD-10-CM | POA: Diagnosis not present

## 2014-08-25 LAB — COMPREHENSIVE METABOLIC PANEL
ALK PHOS: 81 U/L (ref 39–117)
ALT: 14 U/L (ref 0–35)
AST: 20 U/L (ref 0–37)
Albumin: 4 g/dL (ref 3.5–5.2)
Anion gap: 5 (ref 5–15)
BUN: 13 mg/dL (ref 6–23)
CO2: 27 mmol/L (ref 19–32)
Calcium: 8.9 mg/dL (ref 8.4–10.5)
Chloride: 107 mmol/L (ref 96–112)
Creatinine, Ser: 0.75 mg/dL (ref 0.50–1.10)
GFR calc Af Amer: 84 mL/min — ABNORMAL LOW (ref >90)
GFR, EST NON AFRICAN AMERICAN: 72 mL/min — AB (ref >90)
GLUCOSE: 99 mg/dL (ref 70–99)
Potassium: 3.4 mmol/L — ABNORMAL LOW (ref 3.5–5.1)
Sodium: 139 mmol/L (ref 135–145)
TOTAL PROTEIN: 7.2 g/dL (ref 6.0–8.3)
Total Bilirubin: 0.4 mg/dL (ref 0.3–1.2)

## 2014-08-25 LAB — URINALYSIS, ROUTINE W REFLEX MICROSCOPIC
BILIRUBIN URINE: NEGATIVE
Glucose, UA: NEGATIVE mg/dL
Hgb urine dipstick: NEGATIVE
Ketones, ur: NEGATIVE mg/dL
LEUKOCYTES UA: NEGATIVE
NITRITE: NEGATIVE
PH: 7.5 (ref 5.0–8.0)
Protein, ur: NEGATIVE mg/dL
Specific Gravity, Urine: 1.015 (ref 1.005–1.030)
UROBILINOGEN UA: 0.2 mg/dL (ref 0.0–1.0)

## 2014-08-25 LAB — LIPASE, BLOOD: Lipase: 18 U/L (ref 11–59)

## 2014-08-25 LAB — TROPONIN I: Troponin I: <0.03 ng/mL (ref <0.031)

## 2014-08-25 MED ORDER — ONDANSETRON HCL 4 MG/2ML IJ SOLN
4.0000 mg | Freq: Once | INTRAMUSCULAR | Status: AC
  Administered 2014-08-25: 4 mg via INTRAVENOUS
  Filled 2014-08-25: qty 2

## 2014-08-25 MED ORDER — GI COCKTAIL ~~LOC~~
30.0000 mL | Freq: Once | ORAL | Status: AC
  Administered 2014-08-25: 30 mL via ORAL
  Filled 2014-08-25: qty 30

## 2014-08-25 MED ORDER — SODIUM CHLORIDE 0.9 % IV BOLUS (SEPSIS)
500.0000 mL | Freq: Once | INTRAVENOUS | Status: AC
  Administered 2014-08-25: 500 mL via INTRAVENOUS

## 2014-08-25 MED ORDER — DICYCLOMINE HCL 10 MG PO CAPS
10.0000 mg | ORAL_CAPSULE | Freq: Once | ORAL | Status: AC
  Administered 2014-08-25: 10 mg via ORAL
  Filled 2014-08-25: qty 1

## 2014-08-25 MED ORDER — ALUM & MAG HYDROXIDE-SIMETH 200-200-20 MG/5ML PO SUSP
15.0000 mL | Freq: Once | ORAL | Status: AC
  Administered 2014-08-25: 15 mL via ORAL
  Filled 2014-08-25: qty 30

## 2014-08-25 NOTE — ED Notes (Addendum)
Patient transported to CT 

## 2014-08-25 NOTE — ED Notes (Signed)
Unable to get EKG done at time, patient is with CT . I will get it when patient returns

## 2014-08-25 NOTE — Discharge Instructions (Signed)

## 2014-08-25 NOTE — ED Notes (Signed)
Gave pt ginger ale and graham crackers. Pt has been tolerating water with no difficulty.

## 2014-08-25 NOTE — ED Notes (Signed)
Patient tried to urinate and is unable to go at this time she will call when she can go.

## 2014-08-25 NOTE — ED Notes (Signed)
MD at bedside. 

## 2014-08-25 NOTE — ED Provider Notes (Signed)
CSN: [id number]     Arrival date & time 08/25/14  0654 History   First MD Initiated Contact with Patient 08/25/14 [id number]     Chief Complaint  Patient presents with  . Headache     (Consider location/radiation/quality/duration/timing/severity/associated sxs/prior Treatment) HPI Comments: Patient history of IBS presents with headache and nausea. She states that 2 days ago, she was out on a windy day and when she came back and she was starting to have a headache. She states she's had headaches before but this headache was worse than her normal headache. Her headache was bifrontal and radiating toward her occiput. She has some associated nausea. Over the last 2 days her headache is been intermittent with some waxing and waning nausea. Currently she only has a mild headache but she has fairly intense nausea. She's had no vomiting. She's having normal bowel movements. She denies any urinary symptoms. She denies any ataxia. She's had some lightheadedness but no vertigo symptoms. She denies any numbness or weakness to her extremities other than some chronic numbness to her fingers which each had beets to arthritis. She denies any weakness to her extremities other than she has trouble with her right arm which she attributes to pain she's having in her right shoulder from an injury she had a couple weeks ago. She denies any new head injuries. She denies any speech deficits or vision changes. She denies any chest discomfort or shortness of breath. She denies any cough or chest congestion. She's been using Tylenol and aspirin without relief in symptoms.  Patient is a 81 y.o. female presenting with headaches.  Headache Associated symptoms: nausea   Associated symptoms: no abdominal pain, no back pain, no congestion, no cough, no diarrhea, no dizziness, no fatigue, no fever, no numbness, no vomiting and no weakness     Past Medical History  Diagnosis Date  . Irritable bowel syndrome   . Diverticulosis of colon  (without mention of hemorrhage)   . Other and unspecified hyperlipidemia   . Unspecified essential hypertension   . Diaphragmatic hernia without mention of obstruction or gangrene   . Esophageal reflux   . Ventral hernia, unspecified, without mention of obstruction or gangrene    Past Surgical History  Procedure Laterality Date  . Hiatal hernia repair    . Cholecystectomy     Family History  Problem Relation Age of Onset  . Colon cancer Neg Hx   . Breast cancer Daughter 70   History  Substance Use Topics  . Smoking status: Never Smoker   . Smokeless tobacco: Never Used  . Alcohol Use: No   OB History    No data available     Review of Systems  Constitutional: Negative for fever, chills, diaphoresis and fatigue.  HENT: Negative for congestion, rhinorrhea and sneezing.   Eyes: Negative.   Respiratory: Negative for cough, chest tightness and shortness of breath.   Cardiovascular: Negative for chest pain and leg swelling.  Gastrointestinal: Positive for nausea. Negative for vomiting, abdominal pain, diarrhea and blood in stool.  Genitourinary: Negative for frequency, hematuria, flank pain and difficulty urinating.  Musculoskeletal: Negative for back pain and arthralgias.  Skin: Negative for rash.  Neurological: Positive for light-headedness and headaches. Negative for dizziness, speech difficulty, weakness and numbness.      Allergies  Flora-q  Home Medications   Prior to Admission medications   Medication Sig Start Date End Date Taking? Authorizing Provider  acetaminophen (TYLENOL) 325 MG tablet Take 650 mg by mouth every  6 (six) hours as needed for mild pain or moderate pain.   Yes Historical Provider, MD  aspirin 81 MG tablet Take 81 mg by mouth daily.     Yes Historical Provider, MD  clonazePAM (KLONOPIN) 0.5 MG tablet Take 0.5 mg by mouth 3 (three) times daily as needed for anxiety (anxiety). For anxiety   Yes Historical Provider, MD  colesevelam (WELCHOL) 625 MG  tablet Take 625 mg by mouth daily.    Yes Historical Provider, MD  dicyclomine (BENTYL) 20 MG tablet Take 1 tablet (20 mg total) by mouth 2 (two) times daily. 01/14/14   Al Corpus, PA-C  esomeprazole (NEXIUM) 40 MG capsule Take 1 capsule (40 mg total) by mouth daily at 12 noon. Patient taking differently: Take 40 mg by mouth 2 (two) times daily before a meal.  06/15/13  Yes Milus Banister, MD  lisinopril (PRINIVIL,ZESTRIL) 20 MG tablet Take 20 mg by mouth every other day. Tuesday, Thursday, and Saturday   Yes Historical Provider, MD  lubiprostone (AMITIZA) 8 MCG capsule Take 8 mcg by mouth 2 (two) times daily with a meal.   Yes Historical Provider, MD  Multiple Vitamins-Minerals (MULTIVITAMIN ADULT PO) Take 1 tablet by mouth daily.   Yes Historical Provider, MD  olmesartan-hydrochlorothiazide (BENICAR HCT) 40-12.5 MG per tablet Take 1 tablet by mouth every other day. Monday, Wednesday, Friday, Sunday   Yes Historical Provider, MD  senna-docusate (SENOKOT-S) 8.6-50 MG per tablet Take 2 tablets by mouth daily. 04/06/14   Carmin Muskrat, MD  traMADol (ULTRAM) 50 MG tablet Take 1 tablet (50 mg total) by mouth every 6 (six) hours as needed for severe pain. 04/06/14   Carmin Muskrat, MD   BP 147/75 mmHg  Pulse 76  Temp(Src) 98.4 F (36.9 C) (Oral)  Resp 17  SpO2 99% Physical Exam  Constitutional: She is oriented to person, place, and time. She appears well-developed and well-nourished.  HENT:  Head: Normocephalic and atraumatic.  Eyes: Pupils are equal, round, and reactive to light.  No nystagmus  Neck: Normal range of motion. Neck supple.  Cardiovascular: Normal rate, regular rhythm and normal heart sounds.   Pulmonary/Chest: Effort normal and breath sounds normal. No respiratory distress. She has no wheezes. She has no rales. She exhibits no tenderness.  Abdominal: Soft. Bowel sounds are normal. There is no tenderness. There is no rebound and no guarding.  Musculoskeletal: Normal range of  motion. She exhibits no edema.  Lymphadenopathy:    She has no cervical adenopathy.  Neurological: She is alert and oriented to person, place, and time.  Motor 5 out of 5 all extremities, sensation grossly intact to light touch all extremities, finger-nose intact, no pronator drift, cranial nerves II through XII grossly intact.  Skin: Skin is warm and dry. No rash noted.  Psychiatric: She has a normal mood and affect.    ED Course  Procedures (including critical care time) Labs Review Labs Reviewed  COMPREHENSIVE METABOLIC PANEL - Abnormal; Notable for the following:    Potassium 3.4 (*)    GFR calc non Af Amer 72 (*)    GFR calc Af Amer 84 (*)    All other components within normal limits  LIPASE, BLOOD  TROPONIN I  URINALYSIS, ROUTINE W REFLEX MICROSCOPIC    Imaging Review Ct Head Wo Contrast  08/25/2014   CLINICAL DATA:  Headache and nausea for 2 days  EXAM: CT HEAD WITHOUT CONTRAST  TECHNIQUE: Contiguous axial images were obtained from the base of the skull through  the vertex without intravenous contrast.  COMPARISON:  09/10/2011  FINDINGS: The bony calvarium is intact. No gross soft tissue abnormality is noted. The mastoid air cells and paranasal sinuses as visualized are well aerated. Mild atrophic changes are noted. No findings to suggest acute hemorrhage, acute infarction or space-occupying mass lesion are noted.  IMPRESSION: No acute abnormality noted.   Electronically Signed   By: Inez Catalina M.D.   On: 08/25/2014 08:25   Dg Abd Acute W/chest  08/25/2014   CLINICAL DATA:  Abdominal pain  EXAM: DG ABDOMEN ACUTE W/ 1V CHEST  COMPARISON:  04/06/2014  FINDINGS: Cardiac shadow is within normal limits. The lungs are well aerated bilaterally without focal infiltrate. Mild interstitial changes are noted but state  Scattered large and small bowel gas is noted. No obstructive changes are seen. No free air is noted. No abnormal mass or abnormal calcifications are seen. Postsurgical changes  are noted consistent with prior cholecystectomy.  IMPRESSION: No acute abnormality in the chest and abdomen.   Electronically Signed   By: Inez Catalina M.D.   On: 08/25/2014 10:10     EKG Interpretation   Date/Time:  Friday August 25 2014 08:23:19 EDT Ventricular Rate:  76 PR Interval:  151 QRS Duration: 74 QT Interval:  403 QTC Calculation: 453 R Axis:   13 Text Interpretation:  Sinus rhythm Abnormal R-wave progression, early  transition Borderline T abnormalities, diffuse leads since last tracing no  significant change Confirmed by Rayme Bui  MD, Kerigan Narvaez ([id number]) on 08/25/2014  8:34:04 AM      MDM   Final diagnoses:  Gastritis  Headache, unspecified headache type    Patient presents with nausea and some epigastric discomfort. She has no significant abdominal tenderness on exam. She had been complaining of some intermittent headaches but currently doesn't have a headache. Her head CT is negative. She has no neurologic deficits. Her abdominal x-ray doesn't show any signs of free air or obstruction. Her labs are unremarkable. She has no symptoms that would be more suggestive of acute coronary syndrome. She was given Zofran with some improvement of symptoms but she still had some vague discomfort in her epigastrium. She was given a GI cocktail and felt much better after this. She was tolerating by mouth fluids without problem. She was discharged home in good condition and instructed to follow-up with her gastroenterologist if her symptoms don't improve in the next 24 hours.    Malvin Johns, MD 08/25/14 [phone]

## 2014-08-25 NOTE — ED Notes (Signed)
Patient reports headache and nausea x 2 days.  Unrelieved with OTC medications.

## 2014-08-25 NOTE — ED Notes (Signed)
Pt states she feels better after GI coctail. Tolerated crackers with no difficulty.

## 2014-08-28 DIAGNOSIS — K2970 Gastritis, unspecified, without bleeding: Secondary | ICD-10-CM | POA: Diagnosis not present

## 2014-08-28 DIAGNOSIS — R109 Unspecified abdominal pain: Secondary | ICD-10-CM | POA: Diagnosis not present

## 2014-08-28 DIAGNOSIS — K5900 Constipation, unspecified: Secondary | ICD-10-CM | POA: Diagnosis not present

## 2014-09-14 DIAGNOSIS — Z961 Presence of intraocular lens: Secondary | ICD-10-CM | POA: Diagnosis not present

## 2014-09-14 DIAGNOSIS — H00022 Hordeolum internum right lower eyelid: Secondary | ICD-10-CM | POA: Diagnosis not present

## 2014-09-14 DIAGNOSIS — H1851 Endothelial corneal dystrophy: Secondary | ICD-10-CM | POA: Diagnosis not present

## 2014-09-14 DIAGNOSIS — D2312 Other benign neoplasm of skin of left eyelid, including canthus: Secondary | ICD-10-CM | POA: Diagnosis not present

## 2014-09-14 DIAGNOSIS — H04123 Dry eye syndrome of bilateral lacrimal glands: Secondary | ICD-10-CM | POA: Diagnosis not present

## 2014-09-14 DIAGNOSIS — H3531 Nonexudative age-related macular degeneration: Secondary | ICD-10-CM | POA: Diagnosis not present

## 2014-10-26 DIAGNOSIS — H1851 Endothelial corneal dystrophy: Secondary | ICD-10-CM | POA: Diagnosis not present

## 2014-10-26 DIAGNOSIS — H00022 Hordeolum internum right lower eyelid: Secondary | ICD-10-CM | POA: Diagnosis not present

## 2014-10-26 DIAGNOSIS — Z961 Presence of intraocular lens: Secondary | ICD-10-CM | POA: Diagnosis not present

## 2014-10-26 DIAGNOSIS — H10413 Chronic giant papillary conjunctivitis, bilateral: Secondary | ICD-10-CM | POA: Diagnosis not present

## 2014-11-20 DIAGNOSIS — Z6829 Body mass index (BMI) 29.0-29.9, adult: Secondary | ICD-10-CM | POA: Diagnosis not present

## 2014-11-20 DIAGNOSIS — I10 Essential (primary) hypertension: Secondary | ICD-10-CM | POA: Diagnosis not present

## 2014-11-20 DIAGNOSIS — M25532 Pain in left wrist: Secondary | ICD-10-CM | POA: Diagnosis not present

## 2014-11-20 DIAGNOSIS — M1990 Unspecified osteoarthritis, unspecified site: Secondary | ICD-10-CM | POA: Diagnosis not present

## 2014-11-20 DIAGNOSIS — K5900 Constipation, unspecified: Secondary | ICD-10-CM | POA: Diagnosis not present

## 2014-11-20 DIAGNOSIS — M25442 Effusion, left hand: Secondary | ICD-10-CM | POA: Diagnosis not present

## 2014-11-20 DIAGNOSIS — M19042 Primary osteoarthritis, left hand: Secondary | ICD-10-CM | POA: Diagnosis not present

## 2014-11-20 DIAGNOSIS — M25432 Effusion, left wrist: Secondary | ICD-10-CM | POA: Diagnosis not present

## 2014-11-30 DIAGNOSIS — R110 Nausea: Secondary | ICD-10-CM | POA: Diagnosis not present

## 2014-11-30 DIAGNOSIS — R109 Unspecified abdominal pain: Secondary | ICD-10-CM | POA: Diagnosis not present

## 2014-12-13 ENCOUNTER — Other Ambulatory Visit: Payer: Self-pay | Admitting: Obstetrics and Gynecology

## 2014-12-13 DIAGNOSIS — Z779 Other contact with and (suspected) exposures hazardous to health: Secondary | ICD-10-CM | POA: Diagnosis not present

## 2014-12-18 LAB — CYTOLOGY - PAP

## 2014-12-25 ENCOUNTER — Emergency Department (HOSPITAL_COMMUNITY)
Admission: EM | Admit: 2014-12-25 | Discharge: 2014-12-25 | Disposition: A | Discharge status: Home / Self Care | Payer: Commercial Managed Care - HMO | Attending: Physician Assistant | Admitting: Physician Assistant

## 2014-12-25 ENCOUNTER — Encounter (HOSPITAL_COMMUNITY): Payer: Self-pay

## 2014-12-25 ENCOUNTER — Emergency Department (HOSPITAL_COMMUNITY): Payer: Commercial Managed Care - HMO

## 2014-12-25 DIAGNOSIS — I10 Essential (primary) hypertension: Secondary | ICD-10-CM | POA: Insufficient documentation

## 2014-12-25 DIAGNOSIS — K2100 Gastro-esophageal reflux disease with esophagitis, without bleeding: Secondary | ICD-10-CM

## 2014-12-25 DIAGNOSIS — Z7982 Long term (current) use of aspirin: Secondary | ICD-10-CM | POA: Diagnosis not present

## 2014-12-25 DIAGNOSIS — Z9049 Acquired absence of other specified parts of digestive tract: Secondary | ICD-10-CM | POA: Insufficient documentation

## 2014-12-25 DIAGNOSIS — R1013 Epigastric pain: Secondary | ICD-10-CM | POA: Diagnosis present

## 2014-12-25 DIAGNOSIS — K219 Gastro-esophageal reflux disease without esophagitis: Secondary | ICD-10-CM | POA: Diagnosis not present

## 2014-12-25 DIAGNOSIS — R0789 Other chest pain: Secondary | ICD-10-CM | POA: Diagnosis not present

## 2014-12-25 DIAGNOSIS — Z79899 Other long term (current) drug therapy: Secondary | ICD-10-CM | POA: Diagnosis not present

## 2014-12-25 DIAGNOSIS — R197 Diarrhea, unspecified: Secondary | ICD-10-CM | POA: Diagnosis not present

## 2014-12-25 LAB — URINALYSIS, ROUTINE W REFLEX MICROSCOPIC
BILIRUBIN URINE: NEGATIVE
GLUCOSE, UA: NEGATIVE mg/dL
KETONES UR: NEGATIVE mg/dL
Leukocytes, UA: NEGATIVE
Nitrite: NEGATIVE
PH: 7 (ref 5.0–8.0)
Protein, ur: NEGATIVE mg/dL
Specific Gravity, Urine: 1.005 (ref 1.005–1.030)
Urobilinogen, UA: 0.2 mg/dL (ref 0.0–1.0)

## 2014-12-25 LAB — COMPREHENSIVE METABOLIC PANEL
ALBUMIN: 3.9 g/dL (ref 3.5–5.0)
ALK PHOS: 74 U/L (ref 38–126)
ALT: 14 U/L (ref 14–54)
ANION GAP: 8 (ref 5–15)
AST: 19 U/L (ref 15–41)
BILIRUBIN TOTAL: 0.6 mg/dL (ref 0.3–1.2)
BUN: 14 mg/dL (ref 6–20)
CO2: 29 mmol/L (ref 22–32)
CREATININE: 0.84 mg/dL (ref 0.44–1.00)
Calcium: 9.2 mg/dL (ref 8.9–10.3)
Chloride: 101 mmol/L (ref 101–111)
GFR calc non Af Amer: 59 mL/min — ABNORMAL LOW (ref >60)
Glucose, Bld: 101 mg/dL — ABNORMAL HIGH (ref 65–99)
POTASSIUM: 3.6 mmol/L (ref 3.5–5.1)
Sodium: 138 mmol/L (ref 135–145)
Total Protein: 6.8 g/dL (ref 6.5–8.1)

## 2014-12-25 LAB — CBC
HCT: 36.1 % (ref 36.0–46.0)
Hemoglobin: 11.6 g/dL — ABNORMAL LOW (ref 12.0–15.0)
MCH: 28.1 pg (ref 26.0–34.0)
MCHC: 32.1 g/dL (ref 30.0–36.0)
MCV: 87.4 fL (ref 78.0–100.0)
PLATELETS: 208 10*3/uL (ref 150–400)
RBC: 4.13 MIL/uL (ref 3.87–5.11)
RDW: 13 % (ref 11.5–15.5)
WBC: 2.8 10*3/uL — ABNORMAL LOW (ref 4.0–10.5)

## 2014-12-25 LAB — URINE MICROSCOPIC-ADD ON

## 2014-12-25 LAB — I-STAT TROPONIN, ED: TROPONIN I, POC: 0 ng/mL (ref 0.00–0.08)

## 2014-12-25 LAB — LIPASE, BLOOD: Lipase: 14 U/L — ABNORMAL LOW (ref 22–51)

## 2014-12-25 MED ORDER — GI COCKTAIL ~~LOC~~
30.0000 mL | Freq: Once | ORAL | Status: AC
  Administered 2014-12-25: 30 mL via ORAL
  Filled 2014-12-25: qty 30

## 2014-12-25 NOTE — ED Notes (Signed)
Per pt, was given laxative on Friday.  Started having loose stools.  Pt states today abdominal pain with chest discomfort and back pain.  States stomach hurts worse than chest.  Pt has no cough/fever.  No urinary symptoms for the abdominal pain.

## 2014-12-25 NOTE — ED Notes (Signed)
Patient aware that urine specimen is needed. She states she just went to the bathroom prior to coming to this room.

## 2014-12-25 NOTE — ED Notes (Signed)
Pt given water and crackers  

## 2014-12-25 NOTE — Discharge Instructions (Signed)
Please call your regular doctor in the morning for follow-up appointment. We think you're having worsening reflux. Please return immediately if you have any chest pain or worsening abdominal pain nausea vomiting or diarrhea Food Choices for Gastroesophageal Reflux Disease When you have gastroesophageal reflux disease (GERD), the foods you eat and your eating habits are very important. Choosing the right foods can help ease the discomfort of GERD. WHAT GENERAL GUIDELINES DO I NEED TO FOLLOW?  Choose fruits, vegetables, whole grains, low-fat dairy products, and low-fat meat, fish, and poultry.  Limit fats such as oils, salad dressings, butter, nuts, and avocado.  Keep a food diary to identify foods that cause symptoms.  Avoid foods that cause reflux. These may be different for different people.  Eat frequent small meals instead of three large meals each day.  Eat your meals slowly, in a relaxed setting.  Limit fried foods.  Cook foods using methods other than frying.  Avoid drinking alcohol.  Avoid drinking large amounts of liquids with your meals.  Avoid bending over or lying down until 2-3 hours after eating. WHAT FOODS ARE NOT RECOMMENDED? The following are some foods and drinks that may worsen your symptoms: Vegetables Tomatoes. Tomato juice. Tomato and spaghetti sauce. Chili peppers. Onion and garlic. Horseradish. Fruits Oranges, grapefruit, and lemon (fruit and juice). Meats High-fat meats, fish, and poultry. This includes hot dogs, ribs, ham, sausage, salami, and bacon. Dairy Whole milk and chocolate milk. Sour cream. Cream. Butter. Ice cream. Cream cheese.  Beverages Coffee and tea, with or without caffeine. Carbonated beverages or energy drinks. Condiments Hot sauce. Barbecue sauce.  Sweets/Desserts Chocolate and cocoa. Donuts. Peppermint and spearmint. Fats and Oils High-fat foods, including [place] fries and potato chips. Other Vinegar. Strong spices, such as  black pepper, white pepper, red pepper, cayenne, curry powder, cloves, ginger, and chili powder. The items listed above may not be a complete list of foods and beverages to avoid. Contact your dietitian for more information. Document Released: 04/21/2005 Document Revised: 04/26/2013 Document Reviewed: 02/23/2013 [facility] Patient Information 2015 [place], [place]. This information is not intended to replace advice given to you by your health care provider. Make sure you discuss any questions you have with your health care provider.

## 2014-12-25 NOTE — ED Provider Notes (Signed)
CSN: [id number]     Arrival date & time 12/25/14  1618 History   First MD Initiated Contact with Patient 12/25/14 2021     Chief Complaint  Patient presents with  . Chest Pain  . Abdominal Pain     (Consider location/radiation/quality/duration/timing/severity/associated sxs/prior Treatment) HPI  Patient is a 81 year old female presenting with epigastric pain. Patient's had a history of GERD for several years. She states the epigastric pain is worse when lying down flat. She takes omeprazole 40 mg in the morning. She's been able to eat and drink normally. She had constipation last week initiated a bowel regimen 2 days ago and has had diarrhea since then.  Past Medical History  Diagnosis Date  . Irritable bowel syndrome   . Diverticulosis of colon (without mention of hemorrhage)   . Other and unspecified hyperlipidemia   . Unspecified essential hypertension   . Diaphragmatic hernia without mention of obstruction or gangrene   . Esophageal reflux   . Ventral hernia, unspecified, without mention of obstruction or gangrene    Past Surgical History  Procedure Laterality Date  . Hiatal hernia repair    . Cholecystectomy     Family History  Problem Relation Age of Onset  . Colon cancer Neg Hx   . Breast cancer Daughter 43   Social History  Substance Use Topics  . Smoking status: Never Smoker   . Smokeless tobacco: Never Used  . Alcohol Use: No   OB History    No data available     Review of Systems  Constitutional: Negative for activity change and fatigue.  HENT: Negative for congestion and drooling.   Eyes: Negative for discharge.  Respiratory: Positive for chest tightness. Negative for cough.   Cardiovascular: Negative for chest pain.  Gastrointestinal: Positive for abdominal pain and diarrhea. Negative for nausea, vomiting, constipation and abdominal distention.  Genitourinary: Negative for dysuria and difficulty urinating.  Musculoskeletal: Negative for joint  swelling.  Skin: Negative for rash.  Allergic/Immunologic: Negative for immunocompromised state.  Neurological: Negative for seizures and speech difficulty.  Psychiatric/Behavioral: Negative for behavioral problems and agitation.      Allergies  Flora-q  Home Medications   Prior to Admission medications   Medication Sig Start Date End Date Taking? Authorizing Provider  acetaminophen (TYLENOL) 650 MG CR tablet Take 650 mg by mouth every 8 (eight) hours as needed for pain.   Yes Historical Provider, MD  aspirin 81 MG tablet Take 81 mg by mouth daily.     Yes Historical Provider, MD  clonazePAM (KLONOPIN) 0.5 MG tablet Take 0.5 mg by mouth 3 (three) times daily as needed for anxiety (anxiety). For anxiety   Yes Historical Provider, MD  colesevelam (WELCHOL) 625 MG tablet Take 625 mg by mouth daily.    Yes Historical Provider, MD  cromolyn (OPTICROM) 4 % ophthalmic solution Place 1 drop into both eyes 3 (three) times daily as needed (vision).  12/12/14  Yes Historical Provider, MD  esomeprazole (NEXIUM) 40 MG capsule Take 1 capsule (40 mg total) by mouth daily at 12 noon. Patient taking differently: Take 40 mg by mouth 2 (two) times daily before a meal.  06/15/13  Yes Milus Banister, MD  fexofenadine (ALLEGRA) 180 MG tablet Take 180 mg by mouth daily as needed for allergies.  10/27/14  Yes Historical Provider, MD  Linaclotide (LINZESS) 290 MCG CAPS capsule Take 290 mcg by mouth at bedtime.   Yes Historical Provider, MD  lubiprostone (AMITIZA) 8 MCG capsule Take 8  mcg by mouth 2 (two) times daily with a meal.   Yes Historical Provider, MD  Multiple Vitamins-Minerals (MULTIVITAMIN ADULT PO) Take 1 tablet by mouth daily.   Yes Historical Provider, MD  olmesartan-hydrochlorothiazide (BENICAR HCT) 40-12.5 MG per tablet Take 1 tablet by mouth every other day. Monday, Wednesday, Friday, Sunday   Yes Historical Provider, MD  ondansetron (ZOFRAN-ODT) 4 MG disintegrating tablet Take 4 mg by mouth every 8  (eight) hours as needed for nausea or vomiting.   Yes Historical Provider, MD  dicyclomine (BENTYL) 20 MG tablet Take 1 tablet (20 mg total) by mouth 2 (two) times daily. Patient not taking: Reported on 12/25/2014 01/14/14   Al Corpus, PA-C  lisinopril (PRINIVIL,ZESTRIL) 20 MG tablet Take 20 mg by mouth every other day. Tuesday, Thursday, and Saturday    Historical Provider, MD  senna-docusate (SENOKOT-S) 8.6-50 MG per tablet Take 2 tablets by mouth daily. Patient not taking: Reported on 12/25/2014 04/06/14   Carmin Muskrat, MD  traMADol (ULTRAM) 50 MG tablet Take 1 tablet (50 mg total) by mouth every 6 (six) hours as needed for severe pain. Patient not taking: Reported on 12/25/2014 04/06/14   Carmin Muskrat, MD   BP 144/59 mmHg  Pulse 78  Temp(Src) 97.5 F (36.4 C) (Oral)  Resp 18  SpO2 96% Physical Exam  Constitutional: She is oriented to person, place, and time. She appears well-developed and well-nourished.  HENT:  Head: Normocephalic and atraumatic.  Eyes: Conjunctivae are normal. Right eye exhibits no discharge.  Neck: Neck supple.  Cardiovascular: Normal rate, regular rhythm and normal heart sounds.   No murmur heard. Pulmonary/Chest: Effort normal and breath sounds normal. She has no wheezes. She has no rales.  Abdominal: Soft. She exhibits no distension. There is no tenderness.  Musculoskeletal: Normal range of motion. She exhibits no edema.  Neurological: She is oriented to person, place, and time. No cranial nerve deficit.  Skin: Skin is warm and dry. No rash noted. She is not diaphoretic.  Psychiatric: She has a normal mood and affect. Her behavior is normal.  Nursing note and vitals reviewed.   ED Course  Procedures (including critical care time) Labs Review Labs Reviewed  LIPASE, BLOOD - Abnormal; Notable for the following:    Lipase 14 (*)    All other components within normal limits  COMPREHENSIVE METABOLIC PANEL - Abnormal; Notable for the following:     Glucose, Bld 101 (*)    GFR calc non Af Amer 59 (*)    All other components within normal limits  CBC - Abnormal; Notable for the following:    WBC 2.8 (*)    Hemoglobin 11.6 (*)    All other components within normal limits  URINALYSIS, ROUTINE W REFLEX MICROSCOPIC (NOT AT [facility]) - Abnormal; Notable for the following:    Hgb urine dipstick SMALL (*)    All other components within normal limits  URINE MICROSCOPIC-ADD ON - Abnormal; Notable for the following:    Squamous Epithelial / LPF FEW (*)    All other components within normal limits  I-STAT TROPOININ, ED    Imaging Review Dg Chest 2 View  12/25/2014   CLINICAL DATA:  Chest discomfort and back pain.  EXAM: CHEST  2 VIEW  COMPARISON:  08/25/2014  FINDINGS: Both lungs are clear. Round density in the left chest probably represents a skin ECG sticker. Mild degenerative changes in the thoracic spine. Heart and mediastinum are within normal limits. No large pleural effusions. Surgical clips in the right  upper abdomen. Degenerative changes at the [facility] joints bilaterally.  IMPRESSION: No active cardiopulmonary disease.   Electronically Signed   By: Markus Daft M.D.   On: 12/25/2014 19:17   I have personally reviewed and evaluated these images and lab results as part of my medical decision-making.   EKG Interpretation   Date/Time:  Monday December 25 2014 16:25:32 EDT Ventricular Rate:  73 PR Interval:  147 QRS Duration: 67 QT Interval:  395 QTC Calculation: 435 R Axis:   -4 Text Interpretation:  Sinus rhythm Abnormal R-wave progression, early  transition Left ventricular hypertrophy Inferior infarct, age  indeterminate Baseline wander in lead(s) V2 no acute ischemia No  significant change since last tracing Confirmed by Gerald Leitz  [phone]) on 12/25/2014 10:35:38 PM      MDM   Final diagnoses:  None  Patient is a 81 year old female with history of GERD presenting today with GERD-like symptoms of acid reflux upon lying flat.  Patient says she has intermittent chest pain for last 3 years. This abdominal pain has been going on for the last 3 years. She said she called her primary care provider who said that if she felt like the pain is bad enough she should come to the emergency room. However now she feels much better. She is requesting a "GI cocktail".  She's been eating normally. She's been making loose bowel movements. We told her to stop taking the anticonstipation pill she's been taking since she is now having multiple loose stools a day.  She feels much improved after GI cocktail and can take PO. We offered CT given that she is elderly and came to the ED but she does not want it right now because her daughter needs to get up early.  She does have a soft abdomen and the symptoms sound like GERd, and normal vital signs and physical exam. She took PO and she feels back to baseline.  Bernestine Holsapple Julio Alm, MD 12/25/14 2242

## 2014-12-25 NOTE — ED Notes (Signed)
MD at bedside. 

## 2014-12-25 NOTE — ED Notes (Signed)
Went to doctor on Friday and was given 2 prescriptions to take to help with constipation, Bowels moved Friday night  A lot, consistant with diarrhea. When she gets up  she feels her heart race.  "Sometimes I just lay in the bed and it beats real fast"

## 2015-01-15 DIAGNOSIS — Z23 Encounter for immunization: Secondary | ICD-10-CM | POA: Diagnosis not present

## 2015-01-15 DIAGNOSIS — K219 Gastro-esophageal reflux disease without esophagitis: Secondary | ICD-10-CM | POA: Diagnosis not present

## 2015-01-15 DIAGNOSIS — M1990 Unspecified osteoarthritis, unspecified site: Secondary | ICD-10-CM | POA: Diagnosis not present

## 2015-01-15 DIAGNOSIS — R002 Palpitations: Secondary | ICD-10-CM | POA: Diagnosis not present

## 2015-01-15 DIAGNOSIS — Z6827 Body mass index (BMI) 27.0-27.9, adult: Secondary | ICD-10-CM | POA: Diagnosis not present

## 2015-01-31 DIAGNOSIS — R002 Palpitations: Secondary | ICD-10-CM | POA: Diagnosis not present

## 2015-01-31 DIAGNOSIS — K219 Gastro-esophageal reflux disease without esophagitis: Secondary | ICD-10-CM | POA: Diagnosis not present

## 2015-01-31 DIAGNOSIS — Z6828 Body mass index (BMI) 28.0-28.9, adult: Secondary | ICD-10-CM | POA: Diagnosis not present

## 2015-03-26 ENCOUNTER — Other Ambulatory Visit: Payer: Self-pay | Admitting: Physician Assistant

## 2015-03-26 DIAGNOSIS — K5900 Constipation, unspecified: Secondary | ICD-10-CM | POA: Diagnosis not present

## 2015-03-26 DIAGNOSIS — K219 Gastro-esophageal reflux disease without esophagitis: Secondary | ICD-10-CM | POA: Diagnosis not present

## 2015-03-26 DIAGNOSIS — R634 Abnormal weight loss: Secondary | ICD-10-CM | POA: Diagnosis not present

## 2015-03-26 DIAGNOSIS — R109 Unspecified abdominal pain: Secondary | ICD-10-CM | POA: Diagnosis not present

## 2015-04-02 ENCOUNTER — Encounter (INDEPENDENT_AMBULATORY_CARE_PROVIDER_SITE_OTHER): Payer: Commercial Managed Care - HMO

## 2015-04-02 DIAGNOSIS — R002 Palpitations: Secondary | ICD-10-CM | POA: Diagnosis not present

## 2015-04-04 DIAGNOSIS — R002 Palpitations: Secondary | ICD-10-CM | POA: Diagnosis not present

## 2015-04-05 ENCOUNTER — Other Ambulatory Visit: Payer: Commercial Managed Care - HMO

## 2015-04-09 ENCOUNTER — Ambulatory Visit
Admission: RE | Admit: 2015-04-09 | Discharge: 2015-04-09 | Disposition: A | Discharge status: Home / Self Care | Payer: Commercial Managed Care - HMO | Source: Ambulatory Visit | Attending: Physician Assistant | Admitting: Physician Assistant

## 2015-04-09 DIAGNOSIS — R634 Abnormal weight loss: Secondary | ICD-10-CM | POA: Diagnosis not present

## 2015-04-09 DIAGNOSIS — R1010 Upper abdominal pain, unspecified: Secondary | ICD-10-CM | POA: Diagnosis not present

## 2015-04-09 DIAGNOSIS — R109 Unspecified abdominal pain: Secondary | ICD-10-CM

## 2015-04-09 MED ORDER — IOPAMIDOL (ISOVUE-300) INJECTION 61%
100.0000 mL | Freq: Once | INTRAVENOUS | Status: AC | PRN
  Administered 2015-04-09: 100 mL via INTRAVENOUS

## 2015-05-04 DIAGNOSIS — R05 Cough: Secondary | ICD-10-CM | POA: Diagnosis not present

## 2015-05-04 DIAGNOSIS — Z6827 Body mass index (BMI) 27.0-27.9, adult: Secondary | ICD-10-CM | POA: Diagnosis not present

## 2015-05-04 DIAGNOSIS — J209 Acute bronchitis, unspecified: Secondary | ICD-10-CM | POA: Diagnosis not present

## 2015-05-17 DIAGNOSIS — I739 Peripheral vascular disease, unspecified: Secondary | ICD-10-CM | POA: Diagnosis not present

## 2015-05-17 DIAGNOSIS — E784 Other hyperlipidemia: Secondary | ICD-10-CM | POA: Diagnosis not present

## 2015-05-17 DIAGNOSIS — N390 Urinary tract infection, site not specified: Secondary | ICD-10-CM | POA: Diagnosis not present

## 2015-05-17 DIAGNOSIS — R8290 Unspecified abnormal findings in urine: Secondary | ICD-10-CM | POA: Diagnosis not present

## 2015-05-17 DIAGNOSIS — I10 Essential (primary) hypertension: Secondary | ICD-10-CM | POA: Diagnosis not present

## 2015-05-23 DIAGNOSIS — Z1389 Encounter for screening for other disorder: Secondary | ICD-10-CM | POA: Diagnosis not present

## 2015-05-23 DIAGNOSIS — K219 Gastro-esophageal reflux disease without esophagitis: Secondary | ICD-10-CM | POA: Diagnosis not present

## 2015-05-23 DIAGNOSIS — F419 Anxiety disorder, unspecified: Secondary | ICD-10-CM | POA: Diagnosis not present

## 2015-05-23 DIAGNOSIS — R002 Palpitations: Secondary | ICD-10-CM | POA: Diagnosis not present

## 2015-05-23 DIAGNOSIS — E784 Other hyperlipidemia: Secondary | ICD-10-CM | POA: Diagnosis not present

## 2015-05-23 DIAGNOSIS — Z6826 Body mass index (BMI) 26.0-26.9, adult: Secondary | ICD-10-CM | POA: Diagnosis not present

## 2015-05-23 DIAGNOSIS — I10 Essential (primary) hypertension: Secondary | ICD-10-CM | POA: Diagnosis not present

## 2015-05-23 DIAGNOSIS — R7309 Other abnormal glucose: Secondary | ICD-10-CM | POA: Diagnosis not present

## 2015-05-23 DIAGNOSIS — Z0000 Encounter for general adult medical examination without abnormal findings: Secondary | ICD-10-CM | POA: Diagnosis not present

## 2015-06-01 DIAGNOSIS — R109 Unspecified abdominal pain: Secondary | ICD-10-CM | POA: Diagnosis not present

## 2015-06-01 DIAGNOSIS — Z6826 Body mass index (BMI) 26.0-26.9, adult: Secondary | ICD-10-CM | POA: Diagnosis not present

## 2015-06-01 DIAGNOSIS — R110 Nausea: Secondary | ICD-10-CM | POA: Diagnosis not present

## 2015-06-01 DIAGNOSIS — I10 Essential (primary) hypertension: Secondary | ICD-10-CM | POA: Diagnosis not present

## 2015-06-01 DIAGNOSIS — K219 Gastro-esophageal reflux disease without esophagitis: Secondary | ICD-10-CM | POA: Diagnosis not present

## 2015-06-01 DIAGNOSIS — K5900 Constipation, unspecified: Secondary | ICD-10-CM | POA: Diagnosis not present

## 2015-06-12 DIAGNOSIS — Z6827 Body mass index (BMI) 27.0-27.9, adult: Secondary | ICD-10-CM | POA: Diagnosis not present

## 2015-06-12 DIAGNOSIS — Z124 Encounter for screening for malignant neoplasm of cervix: Secondary | ICD-10-CM | POA: Diagnosis not present

## 2015-06-12 DIAGNOSIS — Z779 Other contact with and (suspected) exposures hazardous to health: Secondary | ICD-10-CM | POA: Diagnosis not present

## 2015-06-14 DIAGNOSIS — H353122 Nonexudative age-related macular degeneration, left eye, intermediate dry stage: Secondary | ICD-10-CM | POA: Diagnosis not present

## 2015-06-14 DIAGNOSIS — H353114 Nonexudative age-related macular degeneration, right eye, advanced atrophic with subfoveal involvement: Secondary | ICD-10-CM | POA: Diagnosis not present

## 2015-06-14 DIAGNOSIS — H10413 Chronic giant papillary conjunctivitis, bilateral: Secondary | ICD-10-CM | POA: Diagnosis not present

## 2015-06-14 DIAGNOSIS — H04123 Dry eye syndrome of bilateral lacrimal glands: Secondary | ICD-10-CM | POA: Diagnosis not present

## 2015-06-14 DIAGNOSIS — Z961 Presence of intraocular lens: Secondary | ICD-10-CM | POA: Diagnosis not present

## 2015-06-14 DIAGNOSIS — H1851 Endothelial corneal dystrophy: Secondary | ICD-10-CM | POA: Diagnosis not present

## 2015-06-27 DIAGNOSIS — K219 Gastro-esophageal reflux disease without esophagitis: Secondary | ICD-10-CM | POA: Diagnosis not present

## 2015-06-27 DIAGNOSIS — M5416 Radiculopathy, lumbar region: Secondary | ICD-10-CM | POA: Diagnosis not present

## 2015-06-27 DIAGNOSIS — Z6826 Body mass index (BMI) 26.0-26.9, adult: Secondary | ICD-10-CM | POA: Diagnosis not present

## 2015-07-17 DIAGNOSIS — R109 Unspecified abdominal pain: Secondary | ICD-10-CM | POA: Diagnosis not present

## 2015-07-17 DIAGNOSIS — Z6826 Body mass index (BMI) 26.0-26.9, adult: Secondary | ICD-10-CM | POA: Diagnosis not present

## 2015-07-31 DIAGNOSIS — R102 Pelvic and perineal pain: Secondary | ICD-10-CM | POA: Diagnosis not present

## 2015-08-06 DIAGNOSIS — R109 Unspecified abdominal pain: Secondary | ICD-10-CM | POA: Diagnosis not present

## 2015-08-06 DIAGNOSIS — Z6826 Body mass index (BMI) 26.0-26.9, adult: Secondary | ICD-10-CM | POA: Diagnosis not present

## 2015-08-06 DIAGNOSIS — K5900 Constipation, unspecified: Secondary | ICD-10-CM | POA: Diagnosis not present

## 2015-08-06 DIAGNOSIS — R1013 Epigastric pain: Secondary | ICD-10-CM | POA: Diagnosis not present

## 2015-08-18 ENCOUNTER — Ambulatory Visit (HOSPITAL_COMMUNITY)
Admission: EM | Admit: 2015-08-18 | Discharge: 2015-08-18 | Disposition: A | Discharge status: Home / Self Care | Payer: Commercial Managed Care - HMO | Attending: Emergency Medicine | Admitting: Emergency Medicine

## 2015-08-18 ENCOUNTER — Encounter (HOSPITAL_COMMUNITY): Payer: Self-pay | Admitting: *Deleted

## 2015-08-18 DIAGNOSIS — K625 Hemorrhage of anus and rectum: Secondary | ICD-10-CM

## 2015-08-18 DIAGNOSIS — K644 Residual hemorrhoidal skin tags: Secondary | ICD-10-CM

## 2015-08-18 DIAGNOSIS — K648 Other hemorrhoids: Secondary | ICD-10-CM

## 2015-08-18 LAB — OCCULT BLOOD, POC DEVICE: Fecal Occult Bld: POSITIVE — AB

## 2015-08-18 MED ORDER — DOCUSATE SODIUM 100 MG PO CAPS: 100.0000 mg | ORAL_CAPSULE | Freq: Two times a day (BID) | ORAL | Status: DC

## 2015-08-18 MED ORDER — HYDROCORTISONE 2.5 % RE CREA: TOPICAL_CREAM | RECTAL | Status: DC

## 2015-08-18 NOTE — Discharge Instructions (Signed)
You have several hemorrhoids. One of these is inflamed. This is likely the source of your bleeding. The important thing is to stop straining. Please continue to drink plenty of water and take the Metamucil. I'm going to start her on a stool softener called Colace. Take this twice a day until you are no longer straining. Use the Anusol cream on the rectum twice a day for the next week. You can do sitz baths to help with the hemorrhoid as well. You may continue to see a small a lot of blood on the tissue over the next several days. If you develop profuse bleeding from the rectum, please go to the emergency room.

## 2015-08-18 NOTE — ED Notes (Signed)
Reports feeling urge to have BM this AM - had very small stool results with straining, but noticed bright red blood.  Has had 2-3 more episodes of same today.  C/O chronic low abd pain "for years" that PCP could not find cause of; continues with that same type of intermittent pain.  Denies n/v today.  Denies fevers.

## 2015-08-18 NOTE — ED Provider Notes (Signed)
CSN: [id number]     Arrival date & time 08/18/15  1514 History   First MD Initiated Contact with Patient 08/18/15 1656     Chief Complaint  Patient presents with  . Rectal Bleeding   (Consider location/radiation/quality/duration/timing/severity/associated sxs/prior Treatment) HPI Ann Riddle is a 82 year old woman here with her daughter for evaluation of rectal bleeding. Ann Riddle states this morning, after straining for a bowel movement, Ann Riddle noticed a small amount of bright red blood on the tissue paper. Ann Riddle has continued to see a small amount of blood on the tissue paper when using the restroom today. Ann Riddle denies any prior episodes of rectal bleeding. Ann Riddle does report problems with constipation and straining. Ann Riddle is on Amitiza for this.  Ann Riddle reports chronic lower abdominal pain, but states this is at her baseline. No nausea or vomiting.  Past Medical History  Diagnosis Date  . Irritable bowel syndrome   . Diverticulosis of colon (without mention of hemorrhage)   . Other and unspecified hyperlipidemia   . Unspecified essential hypertension   . Diaphragmatic hernia without mention of obstruction or gangrene   . Esophageal reflux   . Ventral hernia, unspecified, without mention of obstruction or gangrene    Past Surgical History  Procedure Laterality Date  . Hiatal hernia repair    . Cholecystectomy     Family History  Problem Relation Age of Onset  . Colon cancer Neg Hx   . Breast cancer Daughter 47   Social History  Substance Use Topics  . Smoking status: Never Smoker   . Smokeless tobacco: Never Used  . Alcohol Use: No   OB History    No data available     Review of Systems As in history of present illness Allergies  Flora-q  Home Medications   Prior to Admission medications   Medication Sig Start Date End Date Taking? Authorizing Provider  acetaminophen (TYLENOL) 650 MG CR tablet Take 650 mg by mouth every 8 (eight) hours as needed for pain.   Yes Historical Provider, MD   aspirin 81 MG tablet Take 81 mg by mouth daily.     Yes Historical Provider, MD  clonazePAM (KLONOPIN) 0.5 MG tablet Take 0.5 mg by mouth 3 (three) times daily as needed for anxiety (anxiety). For anxiety   Yes Historical Provider, MD  colesevelam (WELCHOL) 625 MG tablet Take 625 mg by mouth daily.    Yes Historical Provider, MD  cromolyn (OPTICROM) 4 % ophthalmic solution Place 1 drop into both eyes 3 (three) times daily as needed (vision).  12/12/14  Yes Historical Provider, MD  esomeprazole (NEXIUM) 40 MG capsule Take 1 capsule (40 mg total) by mouth daily at 12 noon. Patient taking differently: Take 40 mg by mouth 2 (two) times daily before a meal.  06/15/13  Yes Milus Banister, MD  lubiprostone (AMITIZA) 8 MCG capsule Take 8 mcg by mouth 2 (two) times daily with a meal.   Yes Historical Provider, MD  Multiple Vitamins-Minerals (MULTIVITAMIN ADULT PO) Take 1 tablet by mouth daily.   Yes Historical Provider, MD  olmesartan-hydrochlorothiazide (BENICAR HCT) 40-12.5 MG per tablet Take 1 tablet by mouth every other day. Monday, Wednesday, Friday, Sunday   Yes Historical Provider, MD  docusate sodium (COLACE) 100 MG capsule Take 1 capsule (100 mg total) by mouth every 12 (twelve) hours. 08/18/15   Melony Overly, MD  hydrocortisone (ANUSOL-HC) 2.5 % rectal cream Apply rectally 2 times daily 08/18/15   Melony Overly, MD  Linaclotide [facility]) 290 MCG  CAPS capsule Take 290 mcg by mouth at bedtime.    Historical Provider, MD  lisinopril (PRINIVIL,ZESTRIL) 20 MG tablet Take 20 mg by mouth every other day. Tuesday, Thursday, and Saturday    Historical Provider, MD   Meds Ordered and Administered this Visit  Medications - No data to display  BP 176/81 mmHg  Pulse 76  Temp(Src) 97.4 F (36.3 C) (Oral)  SpO2 99% No data found.   Physical Exam  Constitutional: Ann Riddle is oriented to person, place, and time. Ann Riddle appears well-developed and well-nourished. No distress.  Cardiovascular: Normal rate.    Pulmonary/Chest: Effort normal.  Genitourinary: Rectal exam shows external hemorrhoid (2 external hemorrhoids. One is inflamed.). Rectal exam shows no internal hemorrhoid, no mass, no tenderness and anal tone normal. Guaiac positive stool.  Neurological: Ann Riddle is alert and oriented to person, place, and time.    ED Course  Procedures (including critical care time)  Labs Review Labs Reviewed  OCCULT BLOOD, POC DEVICE - Abnormal; Notable for the following:    Fecal Occult Bld POSITIVE (*)    All other components within normal limits    Imaging Review No results found.    MDM   1. External hemorrhoid   2. Rectal bleeding    Likely from hemorrhoids. We'll add Colace to help with constipation. Anusol and sitz baths for hemorrhoids. Return precautions reviewed.    Melony Overly, MD 08/18/15 1758

## 2015-08-23 DIAGNOSIS — K5900 Constipation, unspecified: Secondary | ICD-10-CM | POA: Diagnosis not present

## 2015-08-23 DIAGNOSIS — Z6826 Body mass index (BMI) 26.0-26.9, adult: Secondary | ICD-10-CM | POA: Diagnosis not present

## 2015-08-23 DIAGNOSIS — K625 Hemorrhage of anus and rectum: Secondary | ICD-10-CM | POA: Diagnosis not present

## 2015-08-23 DIAGNOSIS — K219 Gastro-esophageal reflux disease without esophagitis: Secondary | ICD-10-CM | POA: Diagnosis not present

## 2015-08-23 DIAGNOSIS — R10817 Generalized abdominal tenderness: Secondary | ICD-10-CM | POA: Diagnosis not present

## 2015-08-23 DIAGNOSIS — K644 Residual hemorrhoidal skin tags: Secondary | ICD-10-CM | POA: Diagnosis not present

## 2015-08-23 DIAGNOSIS — I10 Essential (primary) hypertension: Secondary | ICD-10-CM | POA: Diagnosis not present

## 2015-09-05 DIAGNOSIS — R109 Unspecified abdominal pain: Secondary | ICD-10-CM | POA: Diagnosis not present

## 2015-09-05 DIAGNOSIS — K5900 Constipation, unspecified: Secondary | ICD-10-CM | POA: Diagnosis not present

## 2015-09-21 DIAGNOSIS — K219 Gastro-esophageal reflux disease without esophagitis: Secondary | ICD-10-CM | POA: Diagnosis not present

## 2015-09-21 DIAGNOSIS — J301 Allergic rhinitis due to pollen: Secondary | ICD-10-CM | POA: Diagnosis not present

## 2015-09-21 DIAGNOSIS — I10 Essential (primary) hypertension: Secondary | ICD-10-CM | POA: Diagnosis not present

## 2015-09-21 DIAGNOSIS — K5900 Constipation, unspecified: Secondary | ICD-10-CM | POA: Diagnosis not present

## 2015-09-21 DIAGNOSIS — I872 Venous insufficiency (chronic) (peripheral): Secondary | ICD-10-CM | POA: Diagnosis not present

## 2015-09-21 DIAGNOSIS — K644 Residual hemorrhoidal skin tags: Secondary | ICD-10-CM | POA: Diagnosis not present

## 2015-09-21 DIAGNOSIS — Z6826 Body mass index (BMI) 26.0-26.9, adult: Secondary | ICD-10-CM | POA: Diagnosis not present

## 2015-10-09 DIAGNOSIS — Z6826 Body mass index (BMI) 26.0-26.9, adult: Secondary | ICD-10-CM | POA: Diagnosis not present

## 2015-10-09 DIAGNOSIS — I10 Essential (primary) hypertension: Secondary | ICD-10-CM | POA: Diagnosis not present

## 2015-10-09 DIAGNOSIS — M1990 Unspecified osteoarthritis, unspecified site: Secondary | ICD-10-CM | POA: Diagnosis not present

## 2015-10-09 DIAGNOSIS — K5900 Constipation, unspecified: Secondary | ICD-10-CM | POA: Diagnosis not present

## 2015-10-09 DIAGNOSIS — I872 Venous insufficiency (chronic) (peripheral): Secondary | ICD-10-CM | POA: Diagnosis not present

## 2015-10-09 DIAGNOSIS — I878 Other specified disorders of veins: Secondary | ICD-10-CM | POA: Diagnosis not present

## 2015-11-13 ENCOUNTER — Encounter (HOSPITAL_COMMUNITY): Payer: Self-pay | Admitting: *Deleted

## 2015-11-13 ENCOUNTER — Observation Stay (HOSPITAL_COMMUNITY)
Admission: EM | Admit: 2015-11-13 | Discharge: 2015-11-14 | Disposition: A | Discharge status: Home / Self Care | Payer: Commercial Managed Care - HMO | Attending: Internal Medicine | Admitting: Internal Medicine

## 2015-11-13 ENCOUNTER — Inpatient Hospital Stay (HOSPITAL_COMMUNITY): Payer: Commercial Managed Care - HMO

## 2015-11-13 ENCOUNTER — Emergency Department (HOSPITAL_COMMUNITY): Payer: Commercial Managed Care - HMO

## 2015-11-13 DIAGNOSIS — R079 Chest pain, unspecified: Secondary | ICD-10-CM | POA: Diagnosis not present

## 2015-11-13 DIAGNOSIS — K5900 Constipation, unspecified: Secondary | ICD-10-CM | POA: Diagnosis present

## 2015-11-13 DIAGNOSIS — Z7982 Long term (current) use of aspirin: Secondary | ICD-10-CM | POA: Diagnosis not present

## 2015-11-13 DIAGNOSIS — R109 Unspecified abdominal pain: Secondary | ICD-10-CM | POA: Diagnosis not present

## 2015-11-13 DIAGNOSIS — Z79899 Other long term (current) drug therapy: Secondary | ICD-10-CM | POA: Insufficient documentation

## 2015-11-13 DIAGNOSIS — G8929 Other chronic pain: Secondary | ICD-10-CM | POA: Diagnosis not present

## 2015-11-13 DIAGNOSIS — R072 Precordial pain: Secondary | ICD-10-CM | POA: Diagnosis not present

## 2015-11-13 DIAGNOSIS — I110 Hypertensive heart disease with heart failure: Secondary | ICD-10-CM | POA: Diagnosis not present

## 2015-11-13 DIAGNOSIS — K581 Irritable bowel syndrome with constipation: Secondary | ICD-10-CM | POA: Diagnosis not present

## 2015-11-13 DIAGNOSIS — Z9049 Acquired absence of other specified parts of digestive tract: Secondary | ICD-10-CM | POA: Diagnosis not present

## 2015-11-13 DIAGNOSIS — K439 Ventral hernia without obstruction or gangrene: Secondary | ICD-10-CM | POA: Insufficient documentation

## 2015-11-13 DIAGNOSIS — I5030 Unspecified diastolic (congestive) heart failure: Secondary | ICD-10-CM | POA: Diagnosis not present

## 2015-11-13 DIAGNOSIS — R0602 Shortness of breath: Secondary | ICD-10-CM | POA: Diagnosis not present

## 2015-11-13 DIAGNOSIS — K219 Gastro-esophageal reflux disease without esophagitis: Secondary | ICD-10-CM | POA: Diagnosis not present

## 2015-11-13 DIAGNOSIS — R0789 Other chest pain: Secondary | ICD-10-CM | POA: Diagnosis not present

## 2015-11-13 DIAGNOSIS — I10 Essential (primary) hypertension: Secondary | ICD-10-CM | POA: Diagnosis present

## 2015-11-13 DIAGNOSIS — E785 Hyperlipidemia, unspecified: Secondary | ICD-10-CM | POA: Insufficient documentation

## 2015-11-13 DIAGNOSIS — K589 Irritable bowel syndrome without diarrhea: Secondary | ICD-10-CM | POA: Diagnosis present

## 2015-11-13 LAB — TROPONIN I
Troponin I: <0.03 ng/mL (ref <0.03)
Troponin I: <0.03 ng/mL (ref <0.03)

## 2015-11-13 LAB — I-STAT TROPONIN, ED: Troponin i, poc: 0 ng/mL (ref 0.00–0.08)

## 2015-11-13 LAB — I-STAT CHEM 8, ED
BUN: 14 mg/dL (ref 6–20)
CREATININE: 0.8 mg/dL (ref 0.44–1.00)
Calcium, Ion: 1.17 mmol/L (ref 1.12–1.23)
Chloride: 105 mmol/L (ref 101–111)
GLUCOSE: 96 mg/dL (ref 65–99)
HEMATOCRIT: 36 % (ref 36.0–46.0)
Hemoglobin: 12.2 g/dL (ref 12.0–15.0)
Potassium: 3.5 mmol/L (ref 3.5–5.1)
Sodium: 142 mmol/L (ref 135–145)
TCO2: 28 mmol/L (ref 0–100)

## 2015-11-13 LAB — HEPATIC FUNCTION PANEL
ALBUMIN: 3.8 g/dL (ref 3.5–5.0)
ALK PHOS: 96 U/L (ref 38–126)
ALT: 13 U/L — ABNORMAL LOW (ref 14–54)
AST: 18 U/L (ref 15–41)
BILIRUBIN DIRECT: 0.1 mg/dL (ref 0.1–0.5)
BILIRUBIN INDIRECT: 0.6 mg/dL (ref 0.3–0.9)
BILIRUBIN TOTAL: 0.7 mg/dL (ref 0.3–1.2)
Total Protein: 6.2 g/dL — ABNORMAL LOW (ref 6.5–8.1)

## 2015-11-13 LAB — LIPASE, BLOOD: LIPASE: 17 U/L (ref 11–51)

## 2015-11-13 MED ORDER — CLONAZEPAM 0.5 MG PO TABS
0.5000 mg | ORAL_TABLET | Freq: Three times a day (TID) | ORAL | Status: DC | PRN
  Administered 2015-11-13: 0.5 mg via ORAL
  Filled 2015-11-13: qty 1

## 2015-11-13 MED ORDER — ENOXAPARIN SODIUM 40 MG/0.4ML ~~LOC~~ SOLN
40.0000 mg | SUBCUTANEOUS | Status: DC
  Administered 2015-11-13: 40 mg via SUBCUTANEOUS
  Filled 2015-11-13: qty 0.4

## 2015-11-13 MED ORDER — HYDROCORTISONE 2.5 % RE CREA
1.0000 "application " | TOPICAL_CREAM | Freq: Two times a day (BID) | RECTAL | Status: DC | PRN
  Filled 2015-11-13: qty 28.35

## 2015-11-13 MED ORDER — DOCUSATE SODIUM 100 MG PO CAPS
100.0000 mg | ORAL_CAPSULE | Freq: Two times a day (BID) | ORAL | Status: DC
  Administered 2015-11-13 – 2015-11-14 (×2): 100 mg via ORAL
  Filled 2015-11-13 (×2): qty 1

## 2015-11-13 MED ORDER — ACETAMINOPHEN 325 MG PO TABS
650.0000 mg | ORAL_TABLET | ORAL | Status: DC | PRN
  Administered 2015-11-13: 650 mg via ORAL
  Filled 2015-11-13: qty 2

## 2015-11-13 MED ORDER — ACETAMINOPHEN 325 MG PO TABS: 650.0000 mg | ORAL_TABLET | Freq: Three times a day (TID) | ORAL | Status: DC | PRN

## 2015-11-13 MED ORDER — GI COCKTAIL ~~LOC~~
30.0000 mL | Freq: Four times a day (QID) | ORAL | Status: DC | PRN
  Administered 2015-11-13: 30 mL via ORAL
  Filled 2015-11-13: qty 30

## 2015-11-13 MED ORDER — PANTOPRAZOLE SODIUM 40 MG PO TBEC
40.0000 mg | DELAYED_RELEASE_TABLET | Freq: Every day | ORAL | Status: DC
  Administered 2015-11-13 – 2015-11-14 (×2): 40 mg via ORAL
  Filled 2015-11-13 (×2): qty 1

## 2015-11-13 MED ORDER — COLESEVELAM HCL 625 MG PO TABS
625.0000 mg | ORAL_TABLET | Freq: Every day | ORAL | Status: DC
  Administered 2015-11-14: 625 mg via ORAL
  Filled 2015-11-13: qty 1

## 2015-11-13 MED ORDER — ONDANSETRON HCL 4 MG/2ML IJ SOLN: 4.0000 mg | Freq: Three times a day (TID) | INTRAMUSCULAR | Status: DC | PRN

## 2015-11-13 MED ORDER — GI COCKTAIL ~~LOC~~
30.0000 mL | Freq: Once | ORAL | Status: AC
  Administered 2015-11-13: 30 mL via ORAL
  Filled 2015-11-13: qty 30

## 2015-11-13 MED ORDER — ASPIRIN 81 MG PO CHEW
324.0000 mg | CHEWABLE_TABLET | Freq: Once | ORAL | Status: DC
  Filled 2015-11-13: qty 4

## 2015-11-13 MED ORDER — ADULT MULTIVITAMIN W/MINERALS CH
ORAL_TABLET | Freq: Every day | ORAL | Status: DC
  Administered 2015-11-14: 1 via ORAL
  Filled 2015-11-13: qty 1

## 2015-11-13 MED ORDER — OLMESARTAN MEDOXOMIL-HCTZ 40-12.5 MG PO TABS: 1.0000 | ORAL_TABLET | ORAL | Status: DC

## 2015-11-13 MED ORDER — PANTOPRAZOLE SODIUM 40 MG PO TBEC: 40.0000 mg | DELAYED_RELEASE_TABLET | Freq: Every day | ORAL | Status: DC

## 2015-11-13 MED ORDER — ONDANSETRON HCL 4 MG/2ML IJ SOLN: 4.0000 mg | Freq: Four times a day (QID) | INTRAMUSCULAR | Status: DC | PRN

## 2015-11-13 MED ORDER — FAMOTIDINE IN NACL 20-0.9 MG/50ML-% IV SOLN
20.0000 mg | Freq: Two times a day (BID) | INTRAVENOUS | Status: DC
  Administered 2015-11-13 – 2015-11-14 (×3): 20 mg via INTRAVENOUS
  Filled 2015-11-13 (×3): qty 50

## 2015-11-13 MED ORDER — LUBIPROSTONE 24 MCG PO CAPS
24.0000 ug | ORAL_CAPSULE | Freq: Two times a day (BID) | ORAL | Status: DC
  Administered 2015-11-13 – 2015-11-14 (×2): 24 ug via ORAL
  Filled 2015-11-13 (×2): qty 1

## 2015-11-13 MED ORDER — ASPIRIN EC 81 MG PO TBEC
81.0000 mg | DELAYED_RELEASE_TABLET | Freq: Every day | ORAL | Status: DC
  Administered 2015-11-14: 81 mg via ORAL
  Filled 2015-11-13: qty 1

## 2015-11-13 MED ORDER — HYDROCHLOROTHIAZIDE 12.5 MG PO CAPS
12.5000 mg | ORAL_CAPSULE | ORAL | Status: DC
  Administered 2015-11-14: 12.5 mg via ORAL
  Filled 2015-11-13: qty 1

## 2015-11-13 MED ORDER — IRBESARTAN 300 MG PO TABS
300.0000 mg | ORAL_TABLET | Freq: Every day | ORAL | Status: DC
  Administered 2015-11-13 – 2015-11-14 (×2): 300 mg via ORAL
  Filled 2015-11-13 (×2): qty 1

## 2015-11-13 MED ORDER — NITROGLYCERIN 0.4 MG SL SUBL
0.4000 mg | SUBLINGUAL_TABLET | SUBLINGUAL | Status: DC | PRN
  Administered 2015-11-13: 0.4 mg via SUBLINGUAL
  Filled 2015-11-13: qty 1

## 2015-11-13 NOTE — ED Notes (Signed)
Pt reports taking 81mg  aspirin at home around 8am today. Pt declined to take aspirin offered in ED. Dr. Audie Pinto made aware.

## 2015-11-13 NOTE — H&P (Signed)
History and Physical    Ann Riddle [id number] DOB: 17-May-1921 DOA: 11/13/2015  PCP: Velna Hatchet, MD  Patient coming from: home   Chief Complaint: Jaw pain, chest tightness.   HPI: Ann Riddle is a 82 y.o. female with medical history significant of IBS, ventral hernia who presents complaining of jaw and chest tightness/. Report some pain in her jaw last night. This morning she wake up with chest tightness. Similar to her GERD but worse, also burning in quality. Pain is worse with meals, fatty meals. Has chronic abdominal pain/. Denies worsening of symptoms.    ED Course: EKG sinus, troponin negative. Chest xray ; enlarge cardiac silhouette, no acute abnormalities, Aortic atherosclerosis. She received   Review of Systems: As per HPI otherwise 10 point review of systems negative.    Past Medical History  Diagnosis Date  . Irritable bowel syndrome   . Diverticulosis of colon (without mention of hemorrhage)   . Other and unspecified hyperlipidemia   . Unspecified essential hypertension   . Diaphragmatic hernia without mention of obstruction or gangrene   . Esophageal reflux   . Ventral hernia, unspecified, without mention of obstruction or gangrene     Past Surgical History  Procedure Laterality Date  . Hiatal hernia repair    . Cholecystectomy     Social History;   reports that she has never smoked. She has never used smokeless tobacco. She reports that she does not drink alcohol or use illicit drugs.  Allergies  Allergen Reactions  . Flora-Q     REACTION: unsure    Family History  Problem Relation Age of Onset  . Colon cancer Neg Hx   . Breast cancer Daughter 13    Prior to Admission medications   Medication Sig Start Date End Date Taking? Authorizing Provider  acetaminophen (TYLENOL) 650 MG CR tablet Take 650 mg by mouth every 8 (eight) hours as needed for pain.   Yes Historical Provider, MD  AMITIZA 24 MCG capsule Take 24 mcg by mouth 2 (two) times  daily. 10/28/15  Yes Historical Provider, MD  aspirin 81 MG tablet Take 81 mg by mouth daily.     Yes Historical Provider, MD  clonazePAM (KLONOPIN) 0.5 MG tablet Take 0.5 mg by mouth 3 (three) times daily as needed for anxiety (sleep). For anxiety   Yes Historical Provider, MD  colesevelam (WELCHOL) 625 MG tablet Take 625 mg by mouth daily.    Yes Historical Provider, MD  docusate sodium (COLACE) 100 MG capsule Take 1 capsule (100 mg total) by mouth every 12 (twelve) hours. Patient taking differently: Take 100 mg by mouth 2 (two) times daily as needed for moderate constipation.  08/18/15  Yes Melony Overly, MD  esomeprazole (NEXIUM) 40 MG capsule Take 1 capsule (40 mg total) by mouth daily at 12 noon. 06/15/13  Yes Milus Banister, MD  hydrocortisone (ANUSOL-HC) 2.5 % rectal cream Apply rectally 2 times daily Patient taking differently: Place 1 application rectally 2 (two) times daily as needed for hemorrhoids. Apply rectally 2 times daily 08/18/15  Yes Melony Overly, MD  Multiple Vitamins-Minerals (MULTIVITAMIN ADULT PO) Take 1 tablet by mouth daily.   Yes Historical Provider, MD  olmesartan (BENICAR) 40 MG tablet Take 40 mg by mouth as directed. tuesday Thursday, and saturday 10/17/15  Yes Historical Provider, MD  olmesartan-hydrochlorothiazide (BENICAR HCT) 40-12.5 MG per tablet Take 1 tablet by mouth every other day. Monday, Wednesday, Friday, Sunday   Yes Historical Provider, MD  ondansetron (ZOFRAN-ODT) 4 MG disintegrating tablet Take 4 mg by mouth every 6 (six) hours as needed. Nausea/vomitting 10/15/15  Yes Historical Provider, MD    Physical Exam: Filed Vitals:   11/13/15 0925 11/13/15 1050 11/13/15 1130  BP: 146/60 143/59   Pulse: 84 74 75  Temp: 98 F (36.7 C) 97.9 F (36.6 C)   TempSrc: Oral Oral   Resp: 22 13 15   Height: 5' (1.524 m)    Weight: 63.504 kg (140 lb)    SpO2: 93% 98% 98%      Constitutional: NAD, calm, comfortable Filed Vitals:   11/13/15 0925 11/13/15 1050  11/13/15 1130  BP: 146/60 143/59   Pulse: 84 74 75  Temp: 98 F (36.7 C) 97.9 F (36.6 C)   TempSrc: Oral Oral   Resp: 22 13 15   Height: 5' (1.524 m)    Weight: 63.504 kg (140 lb)    SpO2: 93% 98% 98%   Eyes: PERRL, lids and conjunctivae normal ENMT: Mucous membranes are moist. Posterior pharynx clear of any exudate or lesions.Normal dentition.  Neck: normal, supple, no masses, no thyromegaly Respiratory: clear to auscultation bilaterally, no wheezing, no crackles. Normal respiratory effort. No accessory muscle use.  Cardiovascular: Regular rate and rhythm, no murmurs / rubs / gallops. No extremity edema. 2+ pedal pulses. No carotid bruits.  Abdomen: no tenderness, no masses palpated. No hepatosplenomegaly. Bowel sounds positive.  Musculoskeletal: no clubbing / cyanosis. No joint deformity upper and lower extremities. Good ROM, no contractures. Normal muscle tone.  Skin: no rashes, lesions, ulcers. No induration Neurologic: CN 2-12 grossly intact. Sensation intact, DTR normal. Strength 5/5 in all 4.  Psychiatric: Normal judgment and insight. Alert and oriented x 3. Normal mood.    Labs on Admission: I have personally reviewed following labs and imaging studies  CBC:  Recent Labs Lab 11/13/15 1003  HGB 12.2  HCT 123XX123   Basic Metabolic Panel:  Recent Labs Lab 11/13/15 1003  NA 142  K 3.5  CL 105  GLUCOSE 96  BUN 14  CREATININE 0.80   GFR: Estimated Creatinine Clearance: 37.3 mL/min (by C-G formula based on Cr of 0.8). Liver Function Tests: No results for input(s): AST, ALT, ALKPHOS, BILITOT, PROT, ALBUMIN in the last 168 hours. No results for input(s): LIPASE, AMYLASE in the last 168 hours. No results for input(s): AMMONIA in the last 168 hours. Coagulation Profile: No results for input(s): INR, PROTIME in the last 168 hours. Cardiac Enzymes: No results for input(s): CKTOTAL, CKMB, CKMBINDEX, TROPONINI in the last 168 hours. BNP (last 3 results) No results for  input(s): PROBNP in the last 8760 hours. HbA1C: No results for input(s): HGBA1C in the last 72 hours. CBG: No results for input(s): GLUCAP in the last 168 hours. Lipid Profile: No results for input(s): CHOL, HDL, LDLCALC, TRIG, CHOLHDL, LDLDIRECT in the last 72 hours. Thyroid Function Tests: No results for input(s): TSH, T4TOTAL, FREET4, T3FREE, THYROIDAB in the last 72 hours. Anemia Panel: No results for input(s): VITAMINB12, FOLATE, FERRITIN, TIBC, IRON, RETICCTPCT in the last 72 hours. Urine analysis:    Component Value Date/Time   COLORURINE YELLOW 12/25/2014 2036   APPEARANCEUR CLEAR 12/25/2014 2036   LABSPEC 1.005 12/25/2014 2036   PHURINE 7.0 12/25/2014 2036   GLUCOSEU NEGATIVE 12/25/2014 2036   HGBUR SMALL* 12/25/2014 2036   BILIRUBINUR NEGATIVE 12/25/2014 2036   KETONESUR NEGATIVE 12/25/2014 2036   PROTEINUR NEGATIVE 12/25/2014 2036   UROBILINOGEN 0.2 12/25/2014 2036   NITRITE NEGATIVE 12/25/2014 2036  LEUKOCYTESUR NEGATIVE 12/25/2014 2036   Sepsis Labs: !!!!!!!!!!!!!!!!!!!!!!!!!!!!!!!!!!!!!!!!!!!! @LABRCNTIP (procalcitonin:4,lacticidven:4) )No results found for this or any previous visit (from the past 240 hour(s)).   Radiological Exams on Admission: Dg Chest Portable 1 View  11/13/2015  CLINICAL DATA:  Shortness of breath and chest discomfort/tightness starting this morning, history unspecified essential hypertension EXAM: PORTABLE CHEST 1 VIEW COMPARISON:  Portable exam 1002 hours compared 12/25/2014 FINDINGS: Enlargement of cardiac silhouette. Mediastinal contours and pulmonary vascularity normal. Atherosclerotic calcification aorta. Lungs clear. No pleural effusion or pneumothorax. Bones demineralized with BILATERAL AC joint degenerative changes. IMPRESSION: Enlargement of cardiac silhouette. No acute abnormalities. Aortic atherosclerosis. Electronically Signed   By: Lavonia Dana M.D.   On: 11/13/2015 10:30    EKG: Independently reviewed. Sinus, no ST changes.    Assessment/Plan Active Problems:   Essential hypertension   Constipation   IBS   Chest pain   1-Chest pain; suspect GI in origin, has ventral hernia,GERD. Improved with GI cocktail.  Pepcid IV GI cocktail PRN.nitroglycerin  Cycle cardiac enzymes.  Check ECHO>  Check Lipase and LFT.   2-HTN; Avapro and every other day HCTZ.   3-IBS, chronic abdominal pain. Denies worsening of sypmtoms. Needs to follow up with PCP   DVT prophylaxis: lovenox.  Code Status: patinet got sentimental, didn't want to talk about it initially,subsequently, patient and daughter said full code.  Family Communication; daughter at bedside.  Disposition Plan: home in 24 to 48 hours.  Consults called: none Admission status: Observation, telemetry    Niel Hummer A MD [facility] Pager [phone]  If 7PM-7AM, please contact night-coverage [email or web address] Password [email or web address]  11/13/2015, 12:30 PM

## 2015-11-13 NOTE — Care Management Note (Signed)
Case Management Note  Patient Details  Name: EDIT JIMINIAN MRN: [id number] Date of Birth: 27-Jan-1922  Subjective/Objective: 82 y/o f admitted w/chest pain. Condition code 44 given-patient/dtr voiced understanding.                   Action/Plan:d/c plan home.   Expected Discharge Date:   (unknown)               Expected Discharge Plan:  Home/Self Care  In-House Referral:     Discharge planning Services  CM Consult  Post Acute Care Choice:    Choice offered to:     DME Arranged:    DME Agency:     HH Arranged:    HH Agency:     Status of Service:  In process, will continue to follow  If discussed at Long Length of Stay Meetings, dates discussed:    Additional Comments:  Dessa Phi, RN 11/13/2015, 4:20 PM

## 2015-11-13 NOTE — ED Notes (Signed)
Dr Beaton at bedside 

## 2015-11-13 NOTE — ED Notes (Signed)
Pt reports that she woke up last night with some pain in her jaw last night and then when she woke up this morning pt reports tightness in her chest.  Pt reports she had some SOB but not at this time.  Pt denies n/v or back pain.  Pt reports abd pain but has been dealing with that for some time now.  Pt a/o x 4.  No diaphoresis noted.

## 2015-11-13 NOTE — ED Provider Notes (Signed)
CSN: [id number]     Arrival date & time 11/13/15  0912 History   First MD Initiated Contact with Patient 11/13/15 [phone]     Chief Complaint  Patient presents with  . Chest Pain      HPI Pt reports that she woke up last night with some pain in her jaw last night and then when she woke up this morning pt reports tightness in her chest. Pt reports she had some SOB but not at this time. Pt denies n/v or back pain. Pt reports abd pain but has been dealing with that for some time now. Pt a/o x 4. No diaphoresis noted.  Past Medical History  Diagnosis Date  . Irritable bowel syndrome   . Diverticulosis of colon (without mention of hemorrhage)   . Other and unspecified hyperlipidemia   . Unspecified essential hypertension   . Diaphragmatic hernia without mention of obstruction or gangrene   . Esophageal reflux   . Ventral hernia, unspecified, without mention of obstruction or gangrene    Past Surgical History  Procedure Laterality Date  . Hiatal hernia repair    . Cholecystectomy     Family History  Problem Relation Age of Onset  . Colon cancer Neg Hx   . Breast cancer Daughter 7   Social History  Substance Use Topics  . Smoking status: Never Smoker   . Smokeless tobacco: Never Used  . Alcohol Use: No   OB History    No data available     Review of Systems  All other systems reviewed and are negative  Allergies  Flora-q  Home Medications   Prior to Admission medications   Medication Sig Start Date End Date Taking? Authorizing Provider  acetaminophen (TYLENOL) 650 MG CR tablet Take 650 mg by mouth every 8 (eight) hours as needed for pain.   Yes Historical Provider, MD  AMITIZA 24 MCG capsule Take 24 mcg by mouth 2 (two) times daily. 10/28/15  Yes Historical Provider, MD  aspirin 81 MG tablet Take 81 mg by mouth daily.     Yes Historical Provider, MD  clonazePAM (KLONOPIN) 0.5 MG tablet Take 0.5 mg by mouth 3 (three) times daily as needed for anxiety (sleep). For  anxiety   Yes Historical Provider, MD  colesevelam (WELCHOL) 625 MG tablet Take 625 mg by mouth daily.    Yes Historical Provider, MD  docusate sodium (COLACE) 100 MG capsule Take 1 capsule (100 mg total) by mouth every 12 (twelve) hours. Patient taking differently: Take 100 mg by mouth 2 (two) times daily as needed for moderate constipation.  08/18/15  Yes Melony Overly, MD  esomeprazole (NEXIUM) 40 MG capsule Take 1 capsule (40 mg total) by mouth daily at 12 noon. 06/15/13  Yes Milus Banister, MD  hydrocortisone (ANUSOL-HC) 2.5 % rectal cream Apply rectally 2 times daily Patient taking differently: Place 1 application rectally 2 (two) times daily as needed for hemorrhoids. Apply rectally 2 times daily 08/18/15  Yes Melony Overly, MD  Multiple Vitamins-Minerals (MULTIVITAMIN ADULT PO) Take 1 tablet by mouth daily.   Yes Historical Provider, MD  olmesartan-hydrochlorothiazide (BENICAR HCT) 40-12.5 MG per tablet Take 1 tablet by mouth every other day. Monday, Wednesday, Friday, Sunday   Yes Historical Provider, MD  ondansetron (ZOFRAN-ODT) 4 MG disintegrating tablet Take 4 mg by mouth every 6 (six) hours as needed. Nausea/vomitting 10/15/15  Yes Historical Provider, MD  nitroGLYCERIN (NITROSTAT) 0.4 MG SL tablet Place 1 tablet (0.4 mg total) under the  tongue every 5 (five) minutes as needed for chest pain. 11/14/15   Thurnell Lose, MD   BP 124/60 mmHg  Pulse 76  Temp(Src) 98.2 F (36.8 C) (Oral)  Resp 16  Ht 5' (1.524 m)  Wt 140 lb (63.504 kg)  BMI 27.34 kg/m2  SpO2 99% Physical Exam Physical Exam  Nursing note and vitals reviewed. Constitutional: She is oriented to person, place, and time. She appears well-developed and well-nourished. No distress.  HENT:  Head: Normocephalic and atraumatic.  Eyes: Pupils are equal, round, and reactive to light.  Neck: Normal range of motion.  Cardiovascular: Normal rate and intact distal pulses.   Pulmonary/Chest: No respiratory distress.  Abdominal:  Normal appearance. She exhibits no distension.  Musculoskeletal: Normal range of motion.  Neurological: She is alert and oriented to person, place, and time. No cranial nerve deficit.  Skin: Skin is warm and dry. No rash noted.  Psychiatric: She has a normal mood and affect. Her behavior is normal.   ED Course  Procedures (including critical care time) Medications  gi cocktail (Maalox,Lidocaine,Donnatal) (30 mLs Oral Given 11/13/15 1138)    Labs Review Labs Reviewed  HEPATIC FUNCTION PANEL - Abnormal; Notable for the following:    Total Protein 6.2 (*)    ALT 13 (*)    All other components within normal limits  CBC - Abnormal; Notable for the following:    WBC 3.5 (*)    RBC 3.65 (*)    Hemoglobin 10.7 (*)    HCT 32.6 (*)    All other components within normal limits  LIPASE, BLOOD  TROPONIN I  TROPONIN I  TROPONIN I  I-STAT CHEM 8, ED  I-STAT TROPOININ, ED    Imaging Review Dg Chest Portable 1 View  11/13/2015  CLINICAL DATA:  Shortness of breath and chest discomfort/tightness starting this morning, history unspecified essential hypertension EXAM: PORTABLE CHEST 1 VIEW COMPARISON:  Portable exam 1002 hours compared 12/25/2014 FINDINGS: Enlargement of cardiac silhouette. Mediastinal contours and pulmonary vascularity normal. Atherosclerotic calcification aorta. Lungs clear. No pleural effusion or pneumothorax. Bones demineralized with BILATERAL AC joint degenerative changes. IMPRESSION: Enlargement of cardiac silhouette. No acute abnormalities. Aortic atherosclerosis. Electronically Signed   By: Lavonia Dana M.D.   On: 11/13/2015 10:30   I have personally reviewed and evaluated these images and lab results as part of my medical decision-making.   EKG Interpretation   Date/Time:  Tuesday November 13 2015 09:22:25 EDT Ventricular Rate:  85 PR Interval:    QRS Duration: 73 QT Interval:  367 QTC Calculation: 437 R Axis:   20 Text Interpretation:  Sinus rhythm Abnormal R-wave  progression, early  transition Confirmed by Kenzly Rogoff  MD, Mingo Siegert ([id number]) on 11/13/2015 9:31:14 AM      MDM   Final diagnoses:  Chest pain, unspecified chest pain type        Leonard Schwartz, MD 11/14/15 1843

## 2015-11-13 NOTE — Plan of Care (Signed)
Problem: Safety: Goal: Ability to remain free from injury will improve Outcome: Completed/Met Date Met:  11/13/15 Pt is oob x1 assist, safety precautions reviewed. Pt agreeable to plan

## 2015-11-14 ENCOUNTER — Observation Stay (HOSPITAL_BASED_OUTPATIENT_CLINIC_OR_DEPARTMENT_OTHER): Payer: Commercial Managed Care - HMO

## 2015-11-14 DIAGNOSIS — R072 Precordial pain: Secondary | ICD-10-CM | POA: Diagnosis not present

## 2015-11-14 DIAGNOSIS — R079 Chest pain, unspecified: Secondary | ICD-10-CM | POA: Diagnosis not present

## 2015-11-14 DIAGNOSIS — I10 Essential (primary) hypertension: Secondary | ICD-10-CM | POA: Diagnosis not present

## 2015-11-14 LAB — CBC
HCT: 32.6 % — ABNORMAL LOW (ref 36.0–46.0)
Hemoglobin: 10.7 g/dL — ABNORMAL LOW (ref 12.0–15.0)
MCH: 29.3 pg (ref 26.0–34.0)
MCHC: 32.8 g/dL (ref 30.0–36.0)
MCV: 89.3 fL (ref 78.0–100.0)
PLATELETS: 205 10*3/uL (ref 150–400)
RBC: 3.65 MIL/uL — AB (ref 3.87–5.11)
RDW: 13.9 % (ref 11.5–15.5)
WBC: 3.5 10*3/uL — ABNORMAL LOW (ref 4.0–10.5)

## 2015-11-14 LAB — ECHOCARDIOGRAM COMPLETE
HEIGHTINCHES: 60 in
WEIGHTICAEL: 2240 [oz_av]

## 2015-11-14 MED ORDER — NITROGLYCERIN 0.4 MG SL SUBL: 0.4000 mg | SUBLINGUAL_TABLET | SUBLINGUAL | Status: AC | PRN

## 2015-11-14 NOTE — Discharge Instructions (Signed)
Follow with Primary MD Velna Hatchet, MD in 7 days   Get CBC, CMP, 2 view Chest X ray checked  by Primary MD or SNF MD in 5-7 days ( we routinely change or add medications that can affect your baseline labs and fluid status, therefore we recommend that you get the mentioned basic workup next visit with your PCP, your PCP may decide not to get them or add new tests based on their clinical decision)   Activity: As tolerated with Full fall precautions use walker/cane & assistance as needed   Disposition Home     Diet:   Heart Healthy  .  For Heart failure patients - Check your Weight same time everyday, if you gain over 2 pounds, or you develop in leg swelling, experience more shortness of breath or chest pain, call your Primary MD immediately. Follow Cardiac Low Salt Diet and 1.5 lit/day fluid restriction.   On your next visit with your primary care physician please Get Medicines reviewed and adjusted.   Please request your Prim.MD to go over all Hospital Tests and Procedure/Radiological results at the follow up, please get all Hospital records sent to your Prim MD by signing hospital release before you go home.   If you experience worsening of your admission symptoms, develop shortness of breath, life threatening emergency, suicidal or homicidal thoughts you must seek medical attention immediately by calling 911 or calling your MD immediately  if symptoms less severe.  You Must read complete instructions/literature along with all the possible adverse reactions/side effects for all the Medicines you take and that have been prescribed to you. Take any new Medicines after you have completely understood and accpet all the possible adverse reactions/side effects.   Do not drive, operate heavy machinery, perform activities at heights, swimming or participation in water activities or provide baby sitting services if your were admitted for syncope or siezures until you have seen by Primary MD or a  Neurologist and advised to do so again.  Do not drive when taking Pain medications.    Do not take more than prescribed Pain, Sleep and Anxiety Medications  Special Instructions: If you have smoked or chewed Tobacco  in the last 2 yrs please stop smoking, stop any regular Alcohol  and or any Recreational drug use.  Wear Seat belts while driving.   Please note  You were cared for by a hospitalist during your hospital stay. If you have any questions about your discharge medications or the care you received while you were in the hospital after you are discharged, you can call the unit and asked to speak with the hospitalist on call if the hospitalist that took care of you is not available. Once you are discharged, your primary care physician will handle any further medical issues. Please note that NO REFILLS for any discharge medications will be authorized once you are discharged, as it is imperative that you return to your primary care physician (or establish a relationship with a primary care physician if you do not have one) for your aftercare needs so that they can reassess your need for medications and monitor your lab values.

## 2015-11-14 NOTE — Progress Notes (Signed)
  Echocardiogram 2D Echocardiogram has been performed.  Ann Riddle M 11/14/2015, 8:04 AM

## 2015-11-14 NOTE — Discharge Summary (Signed)
Ann Riddle [id number] DOB: 1921/07/13 DOA: 11/13/2015  PCP: Velna Hatchet, MD  Admit date: 11/13/2015  Discharge date: 11/14/2015  Admitted From: Home   Disposition:  Home   Recommendations for Outpatient Follow-up:   Follow up with PCP in 1-2 weeks  PCP Please obtain BMP/CBC, 2 view CXR in 1week,  (see Discharge instructions)   PCP Please follow up on the following pending results: None   Home Health: None   Equipment/Devices: None  Discharge Condition: Stable    CODE STATUS: Full   Diet Recommendation: Heart healthy Consultations: None  Chief Complaint  Patient presents with  . Chest Pain     Brief history of present illness from the day of admission and additional interim summary     Ann Riddle is a 82 y.o. female with medical history significant of IBS, ventral hernia who presents complaining of jaw and chest tightness/. Report some pain in her jaw last night. This morning she wake up with chest tightness. Similar to her GERD but worse, also burning in quality. Pain is worse with meals, fatty meals. Has chronic abdominal pain/. Denies worsening of symptoms.    ED Course: EKG sinus, troponin negative. Chest xray ; enlarge cardiac silhouette, no acute abnormalities, Aortic atherosclerosis. She received   Hospital issues addressed      1.Chest pain. Most likely GI in origin, improved after GI cocktail, ruled out for MI, EKG and echo unremarkable, EF preserved at 60% without any wall motion abnormality, has chronic compensated diastolic CHF, today she is completely symptom free and eager to go home, will be sent home on aspirin, PPI along with as needed sublingual nitroglycerin. Requested her to follow with cardiology outpatient to see if she would benefit from outpatient stress testing. She is  a healthy active 82 year old female still driving.  2. Hypertension. Continue home regimen.  3. GERD. On PPI.  4. IBS. No acute issues. Continue home regimen.  Discharge diagnosis     Active Problems:   Essential hypertension   Constipation   IBS   Chest pain    Discharge instructions        Discharge Instructions    Diet - low sodium heart healthy    Complete by:  As directed      Discharge instructions    Complete by:  As directed   Follow with Primary MD Velna Hatchet, MD in 7 days   Get CBC, CMP, 2 view Chest X ray checked  by Primary MD or SNF MD in 5-7 days ( we routinely change or add medications that can affect your baseline labs and fluid status, therefore we recommend that you get the mentioned basic workup next visit with your PCP, your PCP may decide not to get them or add new tests based on their clinical decision)   Activity: As tolerated with Full fall precautions use walker/cane & assistance as needed   Disposition Home     Diet:   Heart Healthy   For Heart failure patients - Check your  Weight same time everyday, if you gain over 2 pounds, or you develop in leg swelling, experience more shortness of breath or chest pain, call your Primary MD immediately. Follow Cardiac Low Salt Diet and 1.5 lit/day fluid restriction.   On your next visit with your primary care physician please Get Medicines reviewed and adjusted.   Please request your Prim.MD to go over all Hospital Tests and Procedure/Radiological results at the follow up, please get all Hospital records sent to your Prim MD by signing hospital release before you go home.   If you experience worsening of your admission symptoms, develop shortness of breath, life threatening emergency, suicidal or homicidal thoughts you must seek medical attention immediately by calling 911 or calling your MD immediately  if symptoms less severe.  You Must read complete instructions/literature along with all the  possible adverse reactions/side effects for all the Medicines you take and that have been prescribed to you. Take any new Medicines after you have completely understood and accpet all the possible adverse reactions/side effects.   Do not drive, operate heavy machinery, perform activities at heights, swimming or participation in water activities or provide baby sitting services if your were admitted for syncope or siezures until you have seen by Primary MD or a Neurologist and advised to do so again.  Do not drive when taking Pain medications.    Do not take more than prescribed Pain, Sleep and Anxiety Medications  Special Instructions: If you have smoked or chewed Tobacco  in the last 2 yrs please stop smoking, stop any regular Alcohol  and or any Recreational drug use.  Wear Seat belts while driving.   Please note  You were cared for by a hospitalist during your hospital stay. If you have any questions about your discharge medications or the care you received while you were in the hospital after you are discharged, you can call the unit and asked to speak with the hospitalist on call if the hospitalist that took care of you is not available. Once you are discharged, your primary care physician will handle any further medical issues. Please note that NO REFILLS for any discharge medications will be authorized once you are discharged, as it is imperative that you return to your primary care physician (or establish a relationship with a primary care physician if you do not have one) for your aftercare needs so that they can reassess your need for medications and monitor your lab values.     Increase activity slowly    Complete by:  As directed            Discharge Medications     Medication List    STOP taking these medications        olmesartan 40 MG tablet  Commonly known as:  BENICAR      TAKE these medications        acetaminophen 650 MG CR tablet  Commonly known as:  TYLENOL    Take 650 mg by mouth every 8 (eight) hours as needed for pain.     AMITIZA 24 MCG capsule  Generic drug:  lubiprostone  Take 24 mcg by mouth 2 (two) times daily.     aspirin 81 MG tablet  Take 81 mg by mouth daily.     clonazePAM 0.5 MG tablet  Commonly known as:  KLONOPIN  Take 0.5 mg by mouth 3 (three) times daily as needed for anxiety (sleep). For anxiety     colesevelam 625 MG tablet  Commonly known as:  WELCHOL  Take 625 mg by mouth daily.     docusate sodium 100 MG capsule  Commonly known as:  COLACE  Take 1 capsule (100 mg total) by mouth every 12 (twelve) hours.     esomeprazole 40 MG capsule  Commonly known as:  NEXIUM  Take 1 capsule (40 mg total) by mouth daily at 12 noon.     hydrocortisone 2.5 % rectal cream  Commonly known as:  ANUSOL-HC  Apply rectally 2 times daily     MULTIVITAMIN ADULT PO  Take 1 tablet by mouth daily.     nitroGLYCERIN 0.4 MG SL tablet  Commonly known as:  NITROSTAT  Place 1 tablet (0.4 mg total) under the tongue every 5 (five) minutes as needed for chest pain.     olmesartan-hydrochlorothiazide 40-12.5 MG tablet  Commonly known as:  BENICAR HCT  Take 1 tablet by mouth every other day. Monday, Wednesday, Friday, Sunday     ondansetron 4 MG disintegrating tablet  Commonly known as:  ZOFRAN-ODT  Take 4 mg by mouth every 6 (six) hours as needed. Nausea/vomitting        Allergies  Allergen Reactions  . Flora-Q     REACTION: unsure    Follow-up Information    Follow up with Velna Hatchet, MD. Schedule an appointment as soon as possible for a visit in 1 week.   Specialty:  Internal Medicine   Contact information:   [address] [place] [address] [phone]       Follow up with Pixie Casino, MD. Schedule an appointment as soon as possible for a visit in 1 week.   Specialty:  Cardiology   Contact information:   [address] [place] [address] [phone]       Major procedures and  Radiology Reports - PLEASE review detailed and final reports thoroughly  -     TTE  Study Conclusions  - Left ventricle: The cavity size was normal. There was severe focal basal and mild concentric hypertrophy of the left ventricle. Systolic function was vigorous. The estimated ejection fraction was in the range of 65% to 70%. Wall motion was normal; there were no regional wall motion abnormalities. Doppler parameters are consistent with abnormal left ventricular relaxation (grade 1 diastolic dysfunction). Doppler parameters are consistent with elevated ventricular end-diastolic filling pressure. - Aortic valve: Trileaflet; mildly thickened, mildly calcified leaflets. Transvalvular velocity was within the normal range. There was no stenosis. There was trivial regurgitation. - Aortic root: The aortic root was normal in size. - Ascending aorta: The ascending aorta was normal in size. - Mitral valve: Moderately thickened and calcified valve with no significant stenosis. There was mild regurgitation. - Left atrium: The atrium was mildly dilated. - Right ventricle: Systolic function was normal. - Right atrium: The atrium was normal in size. - Tricuspid valve: There was moderate regurgitation. - Pulmonic valve: There was no regurgitation. - Pulmonary arteries: Systolic pressure was mildly increased. PA peak pressure: 43 mm Hg (S). - Inferior vena cava: The vessel was normal in size. - Pericardium, extracardiac: There was no pericardial effusion.    Dg Chest Portable 1 View  11/13/2015  CLINICAL DATA:  Shortness of breath and chest discomfort/tightness starting this morning, history unspecified essential hypertension EXAM: PORTABLE CHEST 1 VIEW COMPARISON:  Portable exam 1002 hours compared 12/25/2014 FINDINGS: Enlargement of cardiac silhouette. Mediastinal contours and pulmonary vascularity normal. Atherosclerotic calcification aorta. Lungs clear. No pleural effusion or pneumothorax. Bones  demineralized with BILATERAL AC joint degenerative changes. IMPRESSION: Enlargement of cardiac silhouette. No acute abnormalities. Aortic atherosclerosis. Electronically Signed   By: Lavonia Dana M.D.   On: 11/13/2015 10:30    Micro Results     No results found for this or any previous visit (from the past 240 hour(s)).  Today   Subjective    Micheline Maze today has no headache,no chest abdominal pain,no new weakness tingling or numbness, feels much better wants to go home today.     Objective   Blood pressure 124/60, pulse 76, temperature 98.2 F (36.8 C), temperature source Oral, resp. rate 16, height 5' (1.524 m), weight 63.504 kg (140 lb), SpO2 99 %.   Intake/Output Summary (Last 24 hours) at 11/14/15 1120 Last data filed at 11/13/15 2108  Gross per 24 hour  Intake     50 ml  Output      0 ml  Net     50 ml    Exam Awake Alert, Oriented x 3, No new F.N deficits, Normal affect [place].AT,PERRAL Supple Neck,No JVD, No cervical lymphadenopathy appriciated.  Symmetrical Chest wall movement, Good air movement bilaterally, CTAB RRR,No Gallops,Rubs or new Murmurs, No Parasternal Heave +ve B.Sounds, Abd Soft, Non tender, No organomegaly appriciated, No rebound -guarding or rigidity. No Cyanosis, Clubbing or edema, No new Rash or bruise   Data Review   CBC w Diff:  Lab Results  Component Value Date   WBC 3.5* 11/14/2015   HGB 10.7* 11/14/2015   HCT 32.6* 11/14/2015   PLT 205 11/14/2015   LYMPHOPCT 34 04/06/2014   MONOPCT 8 04/06/2014   EOSPCT 2 04/06/2014   BASOPCT 1 04/06/2014    CMP:  Lab Results  Component Value Date   NA 142 11/13/2015   K 3.5 11/13/2015   CL 105 11/13/2015   CO2 29 12/25/2014   BUN 14 11/13/2015   CREATININE 0.80 11/13/2015   PROT 6.2* 11/13/2015   ALBUMIN 3.8 11/13/2015   BILITOT 0.7 11/13/2015   ALKPHOS 96 11/13/2015   AST 18 11/13/2015   ALT 13* 11/13/2015  .   Total Time in preparing paper work, data evaluation and todays exam - 35  minutes  Thurnell Lose M.D on 11/14/2015 at 11:20 AM  [facility]   Office  [phone]

## 2015-11-14 NOTE — Care Management Note (Signed)
Case Management Note  Patient Details  Name: Ann Riddle MRN: [id number] Date of Birth: May 05, 1922  Subjective/Objective:                    Action/Plan:d/c home no needs or orders.   Expected Discharge Date:   (unknown)               Expected Discharge Plan:  Home/Self Care  In-House Referral:     Discharge planning Services  CM Consult  Post Acute Care Choice:    Choice offered to:     DME Arranged:    DME Agency:     HH Arranged:    [place] Agency:     Status of Service:  Completed, signed off  If discussed at [employer] of Stay Meetings, dates discussed:    Additional Comments:  Dessa Phi, RN 11/14/2015, 11:49 AM

## 2015-11-14 NOTE — Evaluation (Signed)
Physical Therapy Evaluation-1x Patient Details Name: Ann Riddle MRN: [id number] DOB: Sep 04, 1921 Today's Date: 11/14/2015   History of Present Illness  82 yo female admitted with chest pain.   Clinical Impression  On eval, pt was Mod Ind with mobility-walked ~75 feet (pt declined to ambulate farther). No LOB or difficulty observed. Pt denied chest pain, dizziness. Do not anticipate pt will have any follow up needs. 1x eval. Will sign off.     Follow Up Recommendations No PT follow up    Equipment Recommendations  None recommended by PT    Recommendations for Other Services       Precautions / Restrictions Precautions Precautions: None Restrictions Weight Bearing Restrictions: No      Mobility  Bed Mobility Overal bed mobility: Independent                Transfers Overall transfer level: Independent                  Ambulation/Gait Ambulation/Gait assistance: Modified independent (Device/Increase time) Ambulation Distance (Feet): 75 Feet Assistive device: None Gait Pattern/deviations: WFL(Within Functional Limits)     General Gait Details: slow gait speed but no LOB. Pt denied chest pain, dizziness..   Stairs            Wheelchair Mobility    Modified Rankin (Stroke Patients Only)       Balance Overall balance assessment: No apparent balance deficits (not formally assessed)                                           Pertinent Vitals/Pain Pain Assessment: No/denies pain    Home Living Family/patient expects to be discharged to:: Private residence Living Arrangements: Alone             Home Equipment: None      Prior Function Level of Independence: Independent               Hand Dominance        Extremity/Trunk Assessment   Upper Extremity Assessment: Overall WFL for tasks assessed           Lower Extremity Assessment: Overall WFL for tasks assessed      Cervical / Trunk Assessment:  Normal  Communication   Communication: No difficulties  Cognition Arousal/Alertness: Awake/alert Behavior During Therapy: WFL for tasks assessed/performed Overall Cognitive Status: Within Functional Limits for tasks assessed                      General Comments      Exercises        Assessment/Plan    PT Assessment Patent does not need any further PT services  PT Diagnosis     PT Problem List    PT Treatment Interventions     PT Goals (Current goals can be found in the Care Plan section) Acute Rehab PT Goals Patient Stated Goal: home today PT Goal Formulation: All assessment and education complete, DC therapy    Frequency     Barriers to discharge        Co-evaluation               End of Session   Activity Tolerance: Patient tolerated treatment well Patient left: in chair;with call bell/phone within reach      Functional Assessment Tool Used: clinical judgement Functional Limitation: Mobility: Walking and moving  around Mobility: Walking and Moving Around Current Status [phone]): At least 1 percent but less than 20 percent impaired, limited or restricted Mobility: Walking and Moving Around Goal Status [phone]): At least 1 percent but less than 20 percent impaired, limited or restricted Mobility: Walking and Moving Around Discharge Status [phone]): At least 1 percent but less than 20 percent impaired, limited or restricted    Time: 1008-1016 PT Time Calculation (min) (ACUTE ONLY): 8 min   Charges:   PT Evaluation $PT Eval Low Complexity: 1 Procedure     PT G Codes:   PT G-Codes **NOT FOR INPATIENT CLASS** Functional Assessment Tool Used: clinical judgement Functional Limitation: Mobility: Walking and moving around Mobility: Walking and Moving Around Current Status [phone]): At least 1 percent but less than 20 percent impaired, limited or restricted Mobility: Walking and Moving Around Goal Status [phone]): At least 1 percent but less than 20  percent impaired, limited or restricted Mobility: Walking and Moving Around Discharge Status [phone]): At least 1 percent but less than 20 percent impaired, limited or restricted    Weston Anna, MPT Pager: [phone]

## 2015-11-20 ENCOUNTER — Encounter (HOSPITAL_COMMUNITY): Payer: Self-pay | Admitting: Emergency Medicine

## 2015-11-20 ENCOUNTER — Emergency Department (HOSPITAL_COMMUNITY)
Admission: EM | Admit: 2015-11-20 | Discharge: 2015-11-20 | Disposition: A | Discharge status: Home / Self Care | Payer: Commercial Managed Care - HMO | Attending: Emergency Medicine | Admitting: Emergency Medicine

## 2015-11-20 ENCOUNTER — Emergency Department (HOSPITAL_COMMUNITY): Payer: Commercial Managed Care - HMO

## 2015-11-20 DIAGNOSIS — I10 Essential (primary) hypertension: Secondary | ICD-10-CM | POA: Diagnosis not present

## 2015-11-20 DIAGNOSIS — Z7982 Long term (current) use of aspirin: Secondary | ICD-10-CM | POA: Diagnosis not present

## 2015-11-20 DIAGNOSIS — Z79899 Other long term (current) drug therapy: Secondary | ICD-10-CM | POA: Diagnosis not present

## 2015-11-20 DIAGNOSIS — R0789 Other chest pain: Secondary | ICD-10-CM | POA: Diagnosis not present

## 2015-11-20 DIAGNOSIS — E785 Hyperlipidemia, unspecified: Secondary | ICD-10-CM | POA: Diagnosis not present

## 2015-11-20 LAB — CBC
HEMATOCRIT: 35.1 % — AB (ref 36.0–46.0)
Hemoglobin: 11.2 g/dL — ABNORMAL LOW (ref 12.0–15.0)
MCH: 28.7 pg (ref 26.0–34.0)
MCHC: 31.9 g/dL (ref 30.0–36.0)
MCV: 90 fL (ref 78.0–100.0)
PLATELETS: 230 10*3/uL (ref 150–400)
RBC: 3.9 MIL/uL (ref 3.87–5.11)
RDW: 13.5 % (ref 11.5–15.5)
WBC: 3.2 10*3/uL — AB (ref 4.0–10.5)

## 2015-11-20 LAB — BASIC METABOLIC PANEL
Anion gap: 5 (ref 5–15)
BUN: 15 mg/dL (ref 6–20)
CHLORIDE: 103 mmol/L (ref 101–111)
CO2: 31 mmol/L (ref 22–32)
CREATININE: 0.75 mg/dL (ref 0.44–1.00)
Calcium: 8.8 mg/dL — ABNORMAL LOW (ref 8.9–10.3)
Glucose, Bld: 127 mg/dL — ABNORMAL HIGH (ref 65–99)
POTASSIUM: 3.8 mmol/L (ref 3.5–5.1)
SODIUM: 139 mmol/L (ref 135–145)

## 2015-11-20 LAB — I-STAT TROPONIN, ED: Troponin i, poc: 0 ng/mL (ref 0.00–0.08)

## 2015-11-20 MED ORDER — FAMOTIDINE 20 MG PO TABS
20.0000 mg | ORAL_TABLET | Freq: Once | ORAL | Status: AC
  Administered 2015-11-20: 20 mg via ORAL
  Filled 2015-11-20: qty 1

## 2015-11-20 MED ORDER — ACETAMINOPHEN 325 MG PO TABS
650.0000 mg | ORAL_TABLET | Freq: Once | ORAL | Status: AC
  Administered 2015-11-20: 650 mg via ORAL
  Filled 2015-11-20: qty 2

## 2015-11-20 MED ORDER — ALUM & MAG HYDROXIDE-SIMETH 200-200-20 MG/5ML PO SUSP
15.0000 mL | Freq: Once | ORAL | Status: AC
  Administered 2015-11-20: 15 mL via ORAL
  Filled 2015-11-20: qty 30

## 2015-11-20 NOTE — ED Notes (Signed)
Pt reports intermittent chest tightness for the past week since last ED visit. Chest tightness accompanied by lightheadedness. No nausea or sob. Pt has cardiologist appointment tomorrow. Called cardiologist who told her to not take home nitro and go to the emergency department.

## 2015-11-20 NOTE — Discharge Instructions (Signed)
It was our pleasure to provide your ER care today - we hope that you feel better.  Your lab work looks good, consistent with your baseline.  Your heart enzyme level is good/normal.  Continue nexium.  You may also try pepcid, gas-x, or maalox as need for symptom relief.  Follow up with your primary care doctor in the next couple days.  Return to ER if worse, new symptoms, fevers, difficulty breathing, other concern.   Nonspecific Chest Pain  Chest pain can be caused by many different conditions. There is always a chance that your pain could be related to something serious, such as a heart attack or a blood clot in your lungs. Chest pain can also be caused by conditions that are not life-threatening. If you have chest pain, it is very important to follow up with your health care provider. CAUSES  Chest pain can be caused by:  Heartburn.  Pneumonia or bronchitis.  Anxiety or stress.  Inflammation around your heart (pericarditis) or lung (pleuritis or pleurisy).  A blood clot in your lung.  A collapsed lung (pneumothorax). It can develop suddenly on its own (spontaneous pneumothorax) or from trauma to the chest.  Shingles infection (varicella-zoster virus).  Heart attack.  Damage to the bones, muscles, and cartilage that make up your chest wall. This can include:  Bruised bones due to injury.  Strained muscles or cartilage due to frequent or repeated coughing or overwork.  Fracture to one or more ribs.  Sore cartilage due to inflammation (costochondritis). RISK FACTORS  Risk factors for chest pain may include:  Activities that increase your risk for trauma or injury to your chest.  Respiratory infections or conditions that cause frequent coughing.  Medical conditions or overeating that can cause heartburn.  Heart disease or family history of heart disease.  Conditions or health behaviors that increase your risk of developing a blood clot.  Having had chicken pox  (varicella zoster). SIGNS AND SYMPTOMS Chest pain can feel like:  Burning or tingling on the surface of your chest or deep in your chest.  Crushing, pressure, aching, or squeezing pain.  Dull or sharp pain that is worse when you move, cough, or take a deep breath.  Pain that is also felt in your back, neck, shoulder, or arm, or pain that spreads to any of these areas. Your chest pain may come and go, or it may stay constant. DIAGNOSIS Lab tests or other studies may be needed to find the cause of your pain. Your health care provider may have you take a test called an ambulatory ECG (electrocardiogram). An ECG records your heartbeat patterns at the time the test is performed. You may also have other tests, such as:  Transthoracic echocardiogram (TTE). During echocardiography, sound waves are used to create a picture of all of the heart structures and to look at how blood flows through your heart.  Transesophageal echocardiogram (TEE).This is a more advanced imaging test that obtains images from inside your body. It allows your health care provider to see your heart in finer detail.  Cardiac monitoring. This allows your health care provider to monitor your heart rate and rhythm in real time.  Holter monitor. This is a portable device that records your heartbeat and can help to diagnose abnormal heartbeats. It allows your health care provider to track your heart activity for several days, if needed.  Stress tests. These can be done through exercise or by taking medicine that makes your heart beat more quickly.  Blood tests.  Imaging tests. TREATMENT  Your treatment depends on what is causing your chest pain. Treatment may include:  Medicines. These may include:  Acid blockers for heartburn.  Anti-inflammatory medicine.  Pain medicine for inflammatory conditions.  Antibiotic medicine, if an infection is present.  Medicines to dissolve blood clots.  Medicines to treat coronary  artery disease.  Supportive care for conditions that do not require medicines. This may include:  Resting.  Applying heat or cold packs to injured areas.  Limiting activities until pain decreases. HOME CARE INSTRUCTIONS  If you were prescribed an antibiotic medicine, finish it all even if you start to feel better.  Avoid any activities that bring on chest pain.  Do not use any tobacco products, including cigarettes, chewing tobacco, or electronic cigarettes. If you need help quitting, ask your health care provider.  Do not drink alcohol.  Take medicines only as directed by your health care provider.  Keep all follow-up visits as directed by your health care provider. This is important. This includes any further testing if your chest pain does not go away.  If heartburn is the cause for your chest pain, you may be told to keep your head raised (elevated) while sleeping. This reduces the chance that acid will go from your stomach into your esophagus.  Make lifestyle changes as directed by your health care provider. These may include:  Getting regular exercise. Ask your health care provider to suggest some activities that are safe for you.  Eating a heart-healthy diet. A registered dietitian can help you to learn healthy eating options.  Maintaining a healthy weight.  Managing diabetes, if necessary.  Reducing stress. SEEK MEDICAL CARE IF:  Your chest pain does not go away after treatment.  You have a rash with blisters on your chest.  You have a fever. SEEK IMMEDIATE MEDICAL CARE IF:   Your chest pain is worse.  You have an increasing cough, or you cough up blood.  You have severe abdominal pain.  You have severe weakness.  You faint.  You have chills.  You have sudden, unexplained chest discomfort.  You have sudden, unexplained discomfort in your arms, back, neck, or jaw.  You have shortness of breath at any time.  You suddenly start to sweat, or your  skin gets clammy.  You feel nauseous or you vomit.  You suddenly feel light-headed or dizzy.  Your heart begins to beat quickly, or it feels like it is skipping beats. These symptoms may represent a serious problem that is an emergency. Do not wait to see if the symptoms will go away. Get medical help right away. Call your local emergency services (911 in the [place] Do not drive yourself to the hospital.   This information is not intended to replace advice given to you by your health care provider. Make sure you discuss any questions you have with your health care provider.   Document Released: 01/29/2005 Document Revised: 05/12/2014 Document Reviewed: 11/25/2013 Elsevier Interactive Patient Education 2016 [employer].     Gastroesophageal Reflux Disease, Adult Normally, food travels down the esophagus and stays in the stomach to be digested. However, when a person has gastroesophageal reflux disease (GERD), food and stomach acid move back up into the esophagus. When this happens, the esophagus becomes sore and inflamed. Over time, GERD can create small holes (ulcers) in the lining of the esophagus.  CAUSES This condition is caused by a problem with the muscle between the esophagus and the stomach (  lower esophageal sphincter, or LES). Normally, the LES muscle closes after food passes through the esophagus to the stomach. When the LES is weakened or abnormal, it does not close properly, and that allows food and stomach acid to go back up into the esophagus. The LES can be weakened by certain dietary substances, medicines, and medical conditions, including:  Tobacco use.  Pregnancy.  Having a hiatal hernia.  Heavy alcohol use.  Certain foods and beverages, such as coffee, chocolate, onions, and peppermint. RISK FACTORS This condition is more likely to develop in:  People who have an increased body weight.  People who have connective tissue disorders.  People who use NSAID  medicines. SYMPTOMS Symptoms of this condition include:  Heartburn.  Difficult or painful swallowing.  The feeling of having a lump in the throat.  Abitter taste in the mouth.  Bad breath.  Having a large amount of saliva.  Having an upset or bloated stomach.  Belching.  Chest pain.  Shortness of breath or wheezing.  Ongoing (chronic) cough or a night-time cough.  Wearing away of tooth enamel.  Weight loss. Different conditions can cause chest pain. Make sure to see your health care provider if you experience chest pain. DIAGNOSIS Your health care provider will take a medical history and perform a physical exam. To determine if you have mild or severe GERD, your health care provider may also monitor how you respond to treatment. You may also have other tests, including:  An endoscopy toexamine your stomach and esophagus with a small camera.  A test thatmeasures the acidity level in your esophagus.  A test thatmeasures how much pressure is on your esophagus.  A barium swallow or modified barium swallow to show the shape, size, and functioning of your esophagus. TREATMENT The goal of treatment is to help relieve your symptoms and to prevent complications. Treatment for this condition may vary depending on how severe your symptoms are. Your health care provider may recommend:  Changes to your diet.  Medicine.  Surgery. HOME CARE INSTRUCTIONS Diet  Follow a diet as recommended by your health care provider. This may involve avoiding foods and drinks such as:  Coffee and tea (with or without caffeine).  Drinks that containalcohol.  Energy drinks and sports drinks.  Carbonated drinks or sodas.  Chocolate and cocoa.  Peppermint and mint flavorings.  Garlic and onions.  Horseradish.  Spicy and acidic foods, including peppers, chili powder, curry powder, vinegar, hot sauces, and barbecue sauce.  Citrus fruit juices and citrus fruits, such as oranges,  lemons, and limes.  Tomato-based foods, such as red sauce, chili, salsa, and pizza with red sauce.  Fried and fatty foods, such as donuts, french fries, potato chips, and high-fat dressings.  High-fat meats, such as hot dogs and fatty cuts of red and white meats, such as rib eye steak, sausage, ham, and bacon.  High-fat dairy items, such as whole milk, butter, and cream cheese.  Eat small, frequent meals instead of large meals.  Avoid drinking large amounts of liquid with your meals.  Avoid eating meals during the 2-3 hours before bedtime.  Avoid lying down right after you eat.  Do not exercise right after you eat. General Instructions  Pay attention to any changes in your symptoms.  Take over-the-counter and prescription medicines only as told by your health care provider. Do not take aspirin, ibuprofen, or other NSAIDs unless your health care provider told you to do so.  Do not use any tobacco products,  including cigarettes, chewing tobacco, and e-cigarettes. If you need help quitting, ask your health care provider.  Wear loose-fitting clothing. Do not wear anything tight around your waist that causes pressure on your abdomen.  Raise (elevate) the head of your bed 6 inches (15cm).  Try to reduce your stress, such as with yoga or meditation. If you need help reducing stress, ask your health care provider.  If you are overweight, reduce your weight to an amount that is healthy for you. Ask your health care provider for guidance about a safe weight loss goal.  Keep all follow-up visits as told by your health care provider. This is important. SEEK MEDICAL CARE IF:  You have new symptoms.  You have unexplained weight loss.  You have difficulty swallowing, or it hurts to swallow.  You have wheezing or a persistent cough.  Your symptoms do not improve with treatment.  You have a hoarse voice. SEEK IMMEDIATE MEDICAL CARE IF:  You have pain in your arms, neck, jaw,  teeth, or back.  You feel sweaty, dizzy, or light-headed.  You have chest pain or shortness of breath.  You vomit and your vomit looks like blood or coffee grounds.  You faint.  Your stool is bloody or black.  You cannot swallow, drink, or eat.   This information is not intended to replace advice given to you by your health care provider. Make sure you discuss any questions you have with your health care provider.   Document Released: 01/29/2005 Document Revised: 01/10/2015 Document Reviewed: 08/16/2014 Elsevier Interactive Patient Education [employer].

## 2015-11-20 NOTE — ED Provider Notes (Signed)
CSN: [id number]     Arrival date & time 11/20/15  1648 History   First MD Initiated Contact with Patient 11/20/15 1712     Chief Complaint  Patient presents with  . Chest Pain     (Consider location/radiation/quality/duration/timing/severity/associated sxs/prior Treatment) Patient is a 82 y.o. female presenting with chest pain. The history is provided by the patient.  Chest Pain Associated symptoms: no abdominal pain, no back pain, no cough, no fever, no headache, no shortness of breath and not vomiting   Patient c/o diffuse chest tightness for the past couple days.  Symptoms constant, persistent, without specific exacerbating or alleviating factors. Occurs at rest. No relation to activity or exertion.  No associated sob, nv or diaphoresis. +heartburn.  Denies hx cad. +hx gerd. No pleuritic pain. No cough or uri c/o. No fever or chills. No leg pain or swelling. States recently admitted for same, and was d/c to home.  States had pcp appt tomorrow, but when informed them of symptoms, they instructed to go to ER.        Past Medical History  Diagnosis Date  . Irritable bowel syndrome   . Diverticulosis of colon (without mention of hemorrhage)   . Other and unspecified hyperlipidemia   . Unspecified essential hypertension   . Diaphragmatic hernia without mention of obstruction or gangrene   . Esophageal reflux   . Ventral hernia, unspecified, without mention of obstruction or gangrene    Past Surgical History  Procedure Laterality Date  . Hiatal hernia repair    . Cholecystectomy     Family History  Problem Relation Age of Onset  . Colon cancer Neg Hx   . Breast cancer Daughter 65   Social History  Substance Use Topics  . Smoking status: Never Smoker   . Smokeless tobacco: Never Used  . Alcohol Use: No   OB History    No data available     Review of Systems  Constitutional: Negative for fever and chills.  HENT: Negative for sore throat.   Eyes: Negative for redness.   Respiratory: Negative for cough and shortness of breath.   Cardiovascular: Positive for chest pain. Negative for leg swelling.  Gastrointestinal: Negative for vomiting, abdominal pain and diarrhea.  Genitourinary: Negative for flank pain.  Musculoskeletal: Negative for back pain and neck pain.  Skin: Negative for rash.  Neurological: Negative for headaches.  Hematological: Does not bruise/bleed easily.  Psychiatric/Behavioral: Negative for confusion.      Allergies  Flora-q  Home Medications   Prior to Admission medications   Medication Sig Start Date End Date Taking? Authorizing Provider  acetaminophen (TYLENOL) 650 MG CR tablet Take 650 mg by mouth every 8 (eight) hours as needed for pain.    Historical Provider, MD  AMITIZA 24 MCG capsule Take 24 mcg by mouth 2 (two) times daily. 10/28/15   Historical Provider, MD  aspirin 81 MG tablet Take 81 mg by mouth daily.      Historical Provider, MD  clonazePAM (KLONOPIN) 0.5 MG tablet Take 0.5 mg by mouth 3 (three) times daily as needed for anxiety (sleep). For anxiety    Historical Provider, MD  colesevelam (WELCHOL) 625 MG tablet Take 625 mg by mouth daily.     Historical Provider, MD  docusate sodium (COLACE) 100 MG capsule Take 1 capsule (100 mg total) by mouth every 12 (twelve) hours. Patient taking differently: Take 100 mg by mouth 2 (two) times daily as needed for moderate constipation.  08/18/15   Junie Panning  Earlene Plater, MD  esomeprazole (NEXIUM) 40 MG capsule Take 1 capsule (40 mg total) by mouth daily at 12 noon. 06/15/13   Milus Banister, MD  hydrocortisone (ANUSOL-HC) 2.5 % rectal cream Apply rectally 2 times daily Patient taking differently: Place 1 application rectally 2 (two) times daily as needed for hemorrhoids. Apply rectally 2 times daily 08/18/15   Melony Overly, MD  Multiple Vitamins-Minerals (MULTIVITAMIN ADULT PO) Take 1 tablet by mouth daily.    Historical Provider, MD  nitroGLYCERIN (NITROSTAT) 0.4 MG SL tablet Place 1 tablet  (0.4 mg total) under the tongue every 5 (five) minutes as needed for chest pain. 11/14/15   Thurnell Lose, MD  olmesartan-hydrochlorothiazide (BENICAR HCT) 40-12.5 MG per tablet Take 1 tablet by mouth every other day. Monday, Wednesday, Friday, Sunday    Historical Provider, MD  ondansetron (ZOFRAN-ODT) 4 MG disintegrating tablet Take 4 mg by mouth every 6 (six) hours as needed. Nausea/vomitting 10/15/15   Historical Provider, MD   BP 131/57 mmHg  Pulse 82  Temp(Src) 98.5 F (36.9 C) (Oral)  Resp 12  SpO2 99% Physical Exam  Constitutional: She appears well-developed and well-nourished. No distress.  HENT:  Mouth/Throat: Oropharynx is clear and moist.  Eyes: Conjunctivae are normal. No scleral icterus.  Neck: Neck supple. No tracheal deviation present.  Cardiovascular: Normal rate, regular rhythm, normal heart sounds and intact distal pulses.  Exam reveals no gallop and no friction rub.   No murmur heard. Pulmonary/Chest: Effort normal and breath sounds normal. No respiratory distress.  Abdominal: Soft. Normal appearance and bowel sounds are normal. She exhibits no distension. There is no tenderness.  Musculoskeletal: She exhibits no edema or tenderness.  Neurological: She is alert.  Skin: Skin is warm and dry. No rash noted. She is not diaphoretic.  Psychiatric: She has a normal mood and affect.  Nursing note and vitals reviewed.   ED Course  Procedures (including critical care time) Labs Review   Results for orders placed or performed during the hospital encounter of 11/20/15  CBC  Result Value Ref Range   WBC 3.2 (L) 4.0 - 10.5 K/uL   RBC 3.90 3.87 - 5.11 MIL/uL   Hemoglobin 11.2 (L) 12.0 - 15.0 g/dL   HCT 35.1 (L) 36.0 - 46.0 %   MCV 90.0 78.0 - 100.0 fL   MCH 28.7 26.0 - 34.0 pg   MCHC 31.9 30.0 - 36.0 g/dL   RDW 13.5 11.5 - 15.5 %   Platelets 230 150 - 400 K/uL  I-stat troponin, ED  Result Value Ref Range   Troponin i, poc 0.00 0.00 - 0.08 ng/mL   Comment 3            Dg Chest Portable 1 View  11/13/2015  CLINICAL DATA:  Shortness of breath and chest discomfort/tightness starting this morning, history unspecified essential hypertension EXAM: PORTABLE CHEST 1 VIEW COMPARISON:  Portable exam 1002 hours compared 12/25/2014 FINDINGS: Enlargement of cardiac silhouette. Mediastinal contours and pulmonary vascularity normal. Atherosclerotic calcification aorta. Lungs clear. No pleural effusion or pneumothorax. Bones demineralized with BILATERAL AC joint degenerative changes. IMPRESSION: Enlargement of cardiac silhouette. No acute abnormalities. Aortic atherosclerosis. Electronically Signed   By: Lavonia Dana M.D.   On: 11/13/2015 10:30      I have personally reviewed and evaluated these images and lab results as part of my medical decision-making.   EKG Interpretation   Date/Time:  Tuesday November 20 2015 16:54:58 EDT Ventricular Rate:  76 PR Interval:  QRS Duration: 72 QT Interval:  373 QTC Calculation: 420 R Axis:   3 Text Interpretation:  Sinus rhythm No significant change since last  tracing Confirmed by Ashok Cordia  MD, Lennette Bihari ([id number]) on 11/20/2015 5:13:57 PM      MDM   Iv ns. Labs.  Reviewed nursing notes and prior charts for additional history.   After constant symptoms for past couple days, trop 0.  Symptoms/lab findings felt not c/w ACS.  Patient also w recent inpatient eval for similar symptoms after which was d/c to home w outpt f/u.    Prior cardiac cath man years ago, neg for signif cad then.   No pleuritic pain, sob or increased wob.   Pt notes hx gerd, and recent heartburn.   Will try gi meds for symptom relief.  Patient currently appears stable for d/c.   Recommend close pcp f/u.   Return precautions provided.     Lajean Saver, MD 11/20/15 1739

## 2015-12-05 ENCOUNTER — Ambulatory Visit: Payer: Commercial Managed Care - HMO | Admitting: Gastroenterology

## 2015-12-12 DIAGNOSIS — M21619 Bunion of unspecified foot: Secondary | ICD-10-CM | POA: Diagnosis not present

## 2015-12-12 DIAGNOSIS — Z6826 Body mass index (BMI) 26.0-26.9, adult: Secondary | ICD-10-CM | POA: Diagnosis not present

## 2015-12-12 DIAGNOSIS — R5383 Other fatigue: Secondary | ICD-10-CM | POA: Diagnosis not present

## 2015-12-12 DIAGNOSIS — R0982 Postnasal drip: Secondary | ICD-10-CM | POA: Diagnosis not present

## 2016-01-10 DIAGNOSIS — H1811 Bullous keratopathy, right eye: Secondary | ICD-10-CM | POA: Diagnosis not present

## 2016-01-10 DIAGNOSIS — H1851 Endothelial corneal dystrophy: Secondary | ICD-10-CM | POA: Diagnosis not present

## 2016-01-18 DIAGNOSIS — Z6826 Body mass index (BMI) 26.0-26.9, adult: Secondary | ICD-10-CM | POA: Diagnosis not present

## 2016-01-18 DIAGNOSIS — J069 Acute upper respiratory infection, unspecified: Secondary | ICD-10-CM | POA: Diagnosis not present

## 2016-02-18 ENCOUNTER — Ambulatory Visit: Payer: Commercial Managed Care - HMO | Admitting: Gastroenterology

## 2016-02-20 DIAGNOSIS — H1851 Endothelial corneal dystrophy: Secondary | ICD-10-CM | POA: Diagnosis not present

## 2016-02-20 DIAGNOSIS — H1859 Other hereditary corneal dystrophies: Secondary | ICD-10-CM | POA: Diagnosis not present

## 2016-02-20 DIAGNOSIS — Z961 Presence of intraocular lens: Secondary | ICD-10-CM | POA: Diagnosis not present

## 2016-05-20 DIAGNOSIS — E784 Other hyperlipidemia: Secondary | ICD-10-CM | POA: Diagnosis not present

## 2016-05-20 DIAGNOSIS — R8299 Other abnormal findings in urine: Secondary | ICD-10-CM | POA: Diagnosis not present

## 2016-05-20 DIAGNOSIS — R7309 Other abnormal glucose: Secondary | ICD-10-CM | POA: Diagnosis not present

## 2016-05-31 ENCOUNTER — Encounter (HOSPITAL_COMMUNITY): Payer: Self-pay | Admitting: Family Medicine

## 2016-05-31 ENCOUNTER — Ambulatory Visit (HOSPITAL_COMMUNITY)
Admission: EM | Admit: 2016-05-31 | Discharge: 2016-05-31 | Disposition: A | Discharge status: Home / Self Care | Payer: Medicare HMO | Attending: Family Medicine | Admitting: Family Medicine

## 2016-05-31 DIAGNOSIS — R0789 Other chest pain: Secondary | ICD-10-CM | POA: Diagnosis not present

## 2016-05-31 DIAGNOSIS — M79602 Pain in left arm: Secondary | ICD-10-CM | POA: Diagnosis not present

## 2016-05-31 DIAGNOSIS — M25561 Pain in right knee: Secondary | ICD-10-CM | POA: Diagnosis not present

## 2016-05-31 MED ORDER — IBUPROFEN 400 MG PO TABS: 400.0000 mg | ORAL_TABLET | Freq: Four times a day (QID) | ORAL | 0 refills | Status: DC | PRN

## 2016-05-31 MED ORDER — KETOROLAC TROMETHAMINE 30 MG/ML IJ SOLN
30.0000 mg | Freq: Once | INTRAMUSCULAR | Status: AC
  Administered 2016-05-31: 30 mg via INTRAMUSCULAR

## 2016-05-31 MED ORDER — KETOROLAC TROMETHAMINE 30 MG/ML IJ SOLN
INTRAMUSCULAR | Status: AC
  Filled 2016-05-31: qty 1
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
## 2016-06-10 DIAGNOSIS — K5900 Constipation, unspecified: Secondary | ICD-10-CM | POA: Diagnosis not present

## 2016-06-10 DIAGNOSIS — E784 Other hyperlipidemia: Secondary | ICD-10-CM | POA: Diagnosis not present

## 2016-06-10 DIAGNOSIS — K219 Gastro-esophageal reflux disease without esophagitis: Secondary | ICD-10-CM | POA: Diagnosis not present

## 2016-06-10 DIAGNOSIS — Z789 Other specified health status: Secondary | ICD-10-CM | POA: Diagnosis not present

## 2016-06-10 DIAGNOSIS — M810 Age-related osteoporosis without current pathological fracture: Secondary | ICD-10-CM | POA: Diagnosis not present

## 2016-06-10 DIAGNOSIS — Z1389 Encounter for screening for other disorder: Secondary | ICD-10-CM | POA: Diagnosis not present

## 2016-06-10 DIAGNOSIS — Z0000 Encounter for general adult medical examination without abnormal findings: Secondary | ICD-10-CM | POA: Diagnosis not present

## 2016-06-10 DIAGNOSIS — Z6827 Body mass index (BMI) 27.0-27.9, adult: Secondary | ICD-10-CM | POA: Diagnosis not present

## 2016-06-10 DIAGNOSIS — I10 Essential (primary) hypertension: Secondary | ICD-10-CM | POA: Diagnosis not present

## 2016-06-16 DIAGNOSIS — I10 Essential (primary) hypertension: Secondary | ICD-10-CM | POA: Diagnosis not present

## 2016-06-16 DIAGNOSIS — E785 Hyperlipidemia, unspecified: Secondary | ICD-10-CM | POA: Diagnosis not present

## 2016-06-16 DIAGNOSIS — G8921 Chronic pain due to trauma: Secondary | ICD-10-CM | POA: Diagnosis not present

## 2016-06-16 DIAGNOSIS — M810 Age-related osteoporosis without current pathological fracture: Secondary | ICD-10-CM | POA: Diagnosis not present

## 2016-06-16 DIAGNOSIS — G629 Polyneuropathy, unspecified: Secondary | ICD-10-CM | POA: Diagnosis not present

## 2016-06-16 DIAGNOSIS — Z7982 Long term (current) use of aspirin: Secondary | ICD-10-CM | POA: Diagnosis not present

## 2016-06-16 DIAGNOSIS — K219 Gastro-esophageal reflux disease without esophagitis: Secondary | ICD-10-CM | POA: Diagnosis not present

## 2016-06-16 DIAGNOSIS — G6289 Other specified polyneuropathies: Secondary | ICD-10-CM | POA: Diagnosis not present

## 2016-06-16 DIAGNOSIS — E784 Other hyperlipidemia: Secondary | ICD-10-CM | POA: Diagnosis not present

## 2016-06-20 DIAGNOSIS — M810 Age-related osteoporosis without current pathological fracture: Secondary | ICD-10-CM | POA: Diagnosis not present

## 2016-06-20 DIAGNOSIS — K219 Gastro-esophageal reflux disease without esophagitis: Secondary | ICD-10-CM | POA: Diagnosis not present

## 2016-06-20 DIAGNOSIS — I10 Essential (primary) hypertension: Secondary | ICD-10-CM | POA: Diagnosis not present

## 2016-06-20 DIAGNOSIS — G629 Polyneuropathy, unspecified: Secondary | ICD-10-CM | POA: Diagnosis not present

## 2016-06-20 DIAGNOSIS — Z7982 Long term (current) use of aspirin: Secondary | ICD-10-CM | POA: Diagnosis not present

## 2016-06-20 DIAGNOSIS — E785 Hyperlipidemia, unspecified: Secondary | ICD-10-CM | POA: Diagnosis not present

## 2016-06-20 DIAGNOSIS — G8921 Chronic pain due to trauma: Secondary | ICD-10-CM | POA: Diagnosis not present

## 2016-06-24 DIAGNOSIS — H353131 Nonexudative age-related macular degeneration, bilateral, early dry stage: Secondary | ICD-10-CM | POA: Diagnosis not present

## 2016-06-24 DIAGNOSIS — H1859 Other hereditary corneal dystrophies: Secondary | ICD-10-CM | POA: Diagnosis not present

## 2016-06-24 DIAGNOSIS — H1851 Endothelial corneal dystrophy: Secondary | ICD-10-CM | POA: Diagnosis not present

## 2016-06-24 DIAGNOSIS — Z961 Presence of intraocular lens: Secondary | ICD-10-CM | POA: Diagnosis not present

## 2016-06-26 DIAGNOSIS — I10 Essential (primary) hypertension: Secondary | ICD-10-CM | POA: Diagnosis not present

## 2016-06-26 DIAGNOSIS — M810 Age-related osteoporosis without current pathological fracture: Secondary | ICD-10-CM | POA: Diagnosis not present

## 2016-06-26 DIAGNOSIS — G629 Polyneuropathy, unspecified: Secondary | ICD-10-CM | POA: Diagnosis not present

## 2016-06-26 DIAGNOSIS — G8921 Chronic pain due to trauma: Secondary | ICD-10-CM | POA: Diagnosis not present

## 2016-06-26 DIAGNOSIS — E785 Hyperlipidemia, unspecified: Secondary | ICD-10-CM | POA: Diagnosis not present

## 2016-06-26 DIAGNOSIS — K219 Gastro-esophageal reflux disease without esophagitis: Secondary | ICD-10-CM | POA: Diagnosis not present

## 2016-06-26 DIAGNOSIS — Z7982 Long term (current) use of aspirin: Secondary | ICD-10-CM | POA: Diagnosis not present

## 2016-07-02 DIAGNOSIS — G8921 Chronic pain due to trauma: Secondary | ICD-10-CM | POA: Diagnosis not present

## 2016-07-02 DIAGNOSIS — G629 Polyneuropathy, unspecified: Secondary | ICD-10-CM | POA: Diagnosis not present

## 2016-07-02 DIAGNOSIS — K219 Gastro-esophageal reflux disease without esophagitis: Secondary | ICD-10-CM | POA: Diagnosis not present

## 2016-07-02 DIAGNOSIS — M810 Age-related osteoporosis without current pathological fracture: Secondary | ICD-10-CM | POA: Diagnosis not present

## 2016-07-02 DIAGNOSIS — Z7982 Long term (current) use of aspirin: Secondary | ICD-10-CM | POA: Diagnosis not present

## 2016-07-02 DIAGNOSIS — E785 Hyperlipidemia, unspecified: Secondary | ICD-10-CM | POA: Diagnosis not present

## 2016-07-02 DIAGNOSIS — I10 Essential (primary) hypertension: Secondary | ICD-10-CM | POA: Diagnosis not present

## 2016-07-04 DIAGNOSIS — G629 Polyneuropathy, unspecified: Secondary | ICD-10-CM | POA: Diagnosis not present

## 2016-07-04 DIAGNOSIS — Z7982 Long term (current) use of aspirin: Secondary | ICD-10-CM | POA: Diagnosis not present

## 2016-07-04 DIAGNOSIS — M810 Age-related osteoporosis without current pathological fracture: Secondary | ICD-10-CM | POA: Diagnosis not present

## 2016-07-04 DIAGNOSIS — E785 Hyperlipidemia, unspecified: Secondary | ICD-10-CM | POA: Diagnosis not present

## 2016-07-04 DIAGNOSIS — G8921 Chronic pain due to trauma: Secondary | ICD-10-CM | POA: Diagnosis not present

## 2016-07-04 DIAGNOSIS — I10 Essential (primary) hypertension: Secondary | ICD-10-CM | POA: Diagnosis not present

## 2016-07-04 DIAGNOSIS — K219 Gastro-esophageal reflux disease without esophagitis: Secondary | ICD-10-CM | POA: Diagnosis not present

## 2016-07-30 DIAGNOSIS — Z01419 Encounter for gynecological examination (general) (routine) without abnormal findings: Secondary | ICD-10-CM | POA: Diagnosis not present

## 2016-07-30 DIAGNOSIS — Z6828 Body mass index (BMI) 28.0-28.9, adult: Secondary | ICD-10-CM | POA: Diagnosis not present

## 2016-07-30 DIAGNOSIS — Z779 Other contact with and (suspected) exposures hazardous to health: Secondary | ICD-10-CM | POA: Diagnosis not present

## 2016-08-21 ENCOUNTER — Emergency Department (HOSPITAL_COMMUNITY): Payer: Medicare HMO

## 2016-08-21 ENCOUNTER — Encounter (HOSPITAL_COMMUNITY): Payer: Self-pay | Admitting: Emergency Medicine

## 2016-08-21 ENCOUNTER — Emergency Department (HOSPITAL_COMMUNITY)
Admission: EM | Admit: 2016-08-21 | Discharge: 2016-08-21 | Disposition: A | Discharge status: Home / Self Care | Payer: Medicare HMO | Attending: Emergency Medicine | Admitting: Emergency Medicine

## 2016-08-21 DIAGNOSIS — R0789 Other chest pain: Secondary | ICD-10-CM

## 2016-08-21 DIAGNOSIS — R079 Chest pain, unspecified: Secondary | ICD-10-CM | POA: Diagnosis present

## 2016-08-21 DIAGNOSIS — I10 Essential (primary) hypertension: Secondary | ICD-10-CM | POA: Insufficient documentation

## 2016-08-21 DIAGNOSIS — Z7982 Long term (current) use of aspirin: Secondary | ICD-10-CM | POA: Diagnosis not present

## 2016-08-21 DIAGNOSIS — R918 Other nonspecific abnormal finding of lung field: Secondary | ICD-10-CM | POA: Diagnosis not present

## 2016-08-21 LAB — BASIC METABOLIC PANEL
Anion gap: 7 (ref 5–15)
BUN: 12 mg/dL (ref 6–20)
CHLORIDE: 106 mmol/L (ref 101–111)
CO2: 27 mmol/L (ref 22–32)
Calcium: 8.8 mg/dL — ABNORMAL LOW (ref 8.9–10.3)
Creatinine, Ser: 0.86 mg/dL (ref 0.44–1.00)
GFR calc Af Amer: >60 mL/min (ref >60)
GFR calc non Af Amer: 57 mL/min — ABNORMAL LOW (ref >60)
GLUCOSE: 98 mg/dL (ref 65–99)
POTASSIUM: 3.8 mmol/L (ref 3.5–5.1)
Sodium: 140 mmol/L (ref 135–145)

## 2016-08-21 LAB — CBC
HEMATOCRIT: 35.7 % — AB (ref 36.0–46.0)
Hemoglobin: 11.6 g/dL — ABNORMAL LOW (ref 12.0–15.0)
MCH: 28.4 pg (ref 26.0–34.0)
MCHC: 32.5 g/dL (ref 30.0–36.0)
MCV: 87.5 fL (ref 78.0–100.0)
Platelets: 225 10*3/uL (ref 150–400)
RBC: 4.08 MIL/uL (ref 3.87–5.11)
RDW: 13.4 % (ref 11.5–15.5)
WBC: 3.5 10*3/uL — ABNORMAL LOW (ref 4.0–10.5)

## 2016-08-21 LAB — I-STAT TROPONIN, ED
Troponin i, poc: 0.01 ng/mL (ref 0.00–0.08)
Troponin i, poc: 0 ng/mL (ref 0.00–0.08)

## 2016-08-21 NOTE — ED Provider Notes (Signed)
[facility] DEPT Provider Note   CSN: [id number] Arrival date & time: 08/21/16  1546     History   Chief Complaint Chief Complaint  Patient presents with  . Chest Pain    HPI Ann Riddle is a 83 y.o. female.  The history is provided by the patient and medical records.  Chest Pain   This is a recurrent problem. The current episode started 6 to 12 hours ago. Episode frequency: Intermittently. The problem has been resolved. Associated with: Bought groceries yesterday. Pain location: Left anterior chest. The patient is experiencing no pain. The pain does not radiate. Pertinent negatives include no abdominal pain, no back pain, no cough, no diaphoresis, no dizziness, no fever, no hemoptysis, no irregular heartbeat, no lower extremity edema, no nausea, no near-syncope, no palpitations, no shortness of breath, no vomiting and no weakness. She has tried nothing for the symptoms. Risk factors include being elderly.  Pertinent negatives for past medical history include no CAD and no seizures.    Past Medical History:  Diagnosis Date  . Diaphragmatic hernia without mention of obstruction or gangrene   . Diverticulosis of colon (without mention of hemorrhage)   . Esophageal reflux   . Irritable bowel syndrome   . Other and unspecified hyperlipidemia   . Unspecified essential hypertension   . Ventral hernia, unspecified, without mention of obstruction or gangrene     Patient Active Problem List   Diagnosis Date Noted  . Chest pain 11/13/2015  . Constipation 11/10/2007  . HYPERLIPIDEMIA 08/04/2007  . Essential hypertension 08/04/2007  . GERD 08/04/2007  . IBS 08/04/2007    Past Surgical History:  Procedure Laterality Date  . CHOLECYSTECTOMY    . HIATAL HERNIA REPAIR      OB History    No data available       Home Medications    Prior to Admission medications   Medication Sig Start Date End Date Taking? Authorizing Provider  acetaminophen (TYLENOL) 650 MG CR tablet  Take 650 mg by mouth every 8 (eight) hours as needed for pain.    Historical Provider, MD  AMITIZA 24 MCG capsule Take 24 mcg by mouth 2 (two) times daily. 10/28/15   Historical Provider, MD  aspirin 81 MG tablet Take 81 mg by mouth daily.      Historical Provider, MD  clonazePAM (KLONOPIN) 0.5 MG tablet Take 0.5 mg by mouth 3 (three) times daily as needed for anxiety (sleep). For anxiety    Historical Provider, MD  colesevelam (WELCHOL) 625 MG tablet Take 625 mg by mouth daily.     Historical Provider, MD  docusate sodium (COLACE) 100 MG capsule Take 1 capsule (100 mg total) by mouth every 12 (twelve) hours. 08/18/15   Melony Overly, MD  esomeprazole (NEXIUM) 40 MG capsule Take 1 capsule (40 mg total) by mouth daily at 12 noon. 06/15/13   Milus Banister, MD  hydrocortisone (ANUSOL-HC) 2.5 % rectal cream Apply rectally 2 times daily Patient taking differently: Place 1 application rectally 2 (two) times daily as needed for hemorrhoids. Apply rectally 2 times daily 08/18/15   Melony Overly, MD  ibuprofen (ADVIL,MOTRIN) 400 MG tablet Take 1 tablet (400 mg total) by mouth every 6 (six) hours as needed. 05/31/16   Lysbeth Penner, FNP  Multiple Vitamins-Minerals (MULTIVITAMIN ADULT PO) Take 1 tablet by mouth daily.    Historical Provider, MD  Multiple Vitamins-Minerals (PRESERVISION AREDS) CAPS Take 1 capsule by mouth 2 (two) times daily.  Historical Provider, MD  nitroGLYCERIN (NITROSTAT) 0.4 MG SL tablet Place 1 tablet (0.4 mg total) under the tongue every 5 (five) minutes as needed for chest pain. 11/14/15   Thurnell Lose, MD  olmesartan (BENICAR) 40 MG tablet Take 40 mg by mouth. Tuesday, Thursdays, and Saturdas 11/15/15   Historical Provider, MD  olmesartan-hydrochlorothiazide (BENICAR HCT) 40-12.5 MG per tablet Take 1 tablet by mouth every other day. Monday, Wednesday, Friday, Sunday    Historical Provider, MD  ondansetron (ZOFRAN-ODT) 4 MG disintegrating tablet Take 4 mg by mouth every 6 (six) hours  as needed. Nausea/vomitting 10/15/15   Historical Provider, MD    Family History Family History  Problem Relation Age of Onset  . Breast cancer Daughter 38  . Colon cancer Neg Hx     Social History Social History  Substance Use Topics  . Smoking status: Never Smoker  . Smokeless tobacco: Never Used  . Alcohol use No     Allergies   Flora-q   Review of Systems Review of Systems  Constitutional: Negative for chills, diaphoresis and fever.  HENT: Negative for ear pain and sore throat.   Eyes: Negative for pain and visual disturbance.  Respiratory: Negative for cough, hemoptysis and shortness of breath.   Cardiovascular: Positive for chest pain. Negative for palpitations and near-syncope.  Gastrointestinal: Negative for abdominal pain, nausea and vomiting.  Genitourinary: Negative for dysuria and hematuria.  Musculoskeletal: Negative for arthralgias and back pain.  Skin: Negative for color change and rash.  Neurological: Negative for dizziness, seizures, syncope and weakness.  All other systems reviewed and are negative.    Physical Exam Updated Vital Signs BP 131/67 (BP Location: Right Arm)   Pulse 80   Temp 98.8 F (37.1 C) (Oral)   Resp 18   SpO2 99%   Physical Exam  Constitutional: She is oriented to person, place, and time. She appears well-developed and well-nourished. No distress.  HENT:  Head: Normocephalic and atraumatic.  Eyes: Conjunctivae are normal.  Neck: Neck supple.  Cardiovascular: Normal rate and regular rhythm.   No murmur heard. Pulmonary/Chest: Effort normal and breath sounds normal. No respiratory distress.  Abdominal: Soft. There is no tenderness.  Musculoskeletal: She exhibits no edema.  TTP about left anterior chest  Neurological: She is alert and oriented to person, place, and time.  Skin: Skin is warm and dry.  Psychiatric: She has a normal mood and affect.  Nursing note and vitals reviewed.    ED Treatments / Results   Labs (all labs ordered are listed, but only abnormal results are displayed) Labs Reviewed  BASIC METABOLIC PANEL - Abnormal; Notable for the following:       Result Value   Calcium 8.8 (*)    GFR calc non Af Amer 57 (*)    All other components within normal limits  CBC - Abnormal; Notable for the following:    WBC 3.5 (*)    Hemoglobin 11.6 (*)    HCT 35.7 (*)    All other components within normal limits  I-STAT TROPOININ, ED    EKG  EKG Interpretation  Date/Time:  Thursday August 21 2016 15:52:24 EDT Ventricular Rate:  79 PR Interval:  130 QRS Duration: 74 QT Interval:  396 QTC Calculation: 454 R Axis:   11 Text Interpretation:  Normal sinus rhythm Normal ECG When compared to prior, no significant changes seen.  No STEMI Confirmed by Sherry Ruffing MD, [place] [phone]) on 08/21/2016 6:11:51 PM       Radiology  Dg Chest 2 View  Result Date: 08/21/2016 CLINICAL DATA:  83 year old female with history of central chest pain for 1 day. EXAM: CHEST  2 VIEW COMPARISON:  Chest x-ray a 11/20/2015. FINDINGS: Mild diffuse interstitial prominence and peribronchial cuffing. Lung volumes are normal. No consolidative airspace disease. No pleural effusions. No pneumothorax. No pulmonary nodule or mass noted. Pulmonary vasculature and the cardiomediastinal silhouette are within normal limits. Atherosclerosis in the thoracic aorta. IMPRESSION: 1. Mild diffuse interstitial prominence and peribronchial cuffing, concerning for an acute bronchitis. 2. Aortic atherosclerosis. Electronically Signed   By: Vinnie Langton M.D.   On: 08/21/2016 16:45    Procedures Procedures (including critical care time)  Medications Ordered in ED Medications - No data to display   Initial Impression / Assessment and Plan / ED Course  I have reviewed the triage vital signs and the nursing notes.  Pertinent labs & imaging results that were available during my care of the patient were reviewed by me and considered in  my medical decision making (see chart for details).    Pt with h/o hiatal hernia presents with CP. Says the pain started this morning, was localized to the left anterior chest w/o radiation, and not associated w/any other symptoms. She says she's felt this pain before, but got concerned that it could be a heart attack, so came into the ED for evaluation. Of note, the Pt says she felt fine yesterday & even went out grocery shopping. Denies F/C, HA, lightheadedness, diaphoresis, cough, SOB, N/V, urinary symptoms, or recent illness. Pt is currently asymptomatic unless her chest is pressed on.  VS & exam as above. EKG: NSR @ 79bpm w/o signs of ischemia. CXR w/findings concerning for acute bronchitis; Pt denies cough. Labs unremarkable, including 2x negative troponin. Low risk per [facility]. Pain inconsistent w/ACS. No PNA on CXR. Pt likely suffering from musculoskeletal chest wall pain. Analgesia offered and declined.  Explained all results to the Pt. Will discharge the Pt home. Recommending follow-up with PCP. ED return precautions provided. Pt acknowledged understanding of, and concurrence with the plan. All questions answered to her satisfaction. In stable condition at the time of discharge.  Final Clinical Impressions(s) / ED Diagnoses   Final diagnoses:  Chest wall pain    New Prescriptions New Prescriptions   No medications on file     Jenny Reichmann, MD 08/21/16 2023    Gwenyth Allegra Tegeler, MD 08/22/16 [phone]
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
## 2016-12-10 DIAGNOSIS — F418 Other specified anxiety disorders: Secondary | ICD-10-CM | POA: Diagnosis not present

## 2016-12-10 DIAGNOSIS — Z6826 Body mass index (BMI) 26.0-26.9, adult: Secondary | ICD-10-CM | POA: Diagnosis not present

## 2016-12-10 DIAGNOSIS — R0789 Other chest pain: Secondary | ICD-10-CM | POA: Diagnosis not present

## 2016-12-10 DIAGNOSIS — I7389 Other specified peripheral vascular diseases: Secondary | ICD-10-CM | POA: Diagnosis not present

## 2016-12-10 DIAGNOSIS — K219 Gastro-esophageal reflux disease without esophagitis: Secondary | ICD-10-CM | POA: Diagnosis not present

## 2016-12-10 DIAGNOSIS — M810 Age-related osteoporosis without current pathological fracture: Secondary | ICD-10-CM | POA: Diagnosis not present

## 2016-12-10 DIAGNOSIS — E784 Other hyperlipidemia: Secondary | ICD-10-CM | POA: Diagnosis not present

## 2016-12-10 DIAGNOSIS — M25512 Pain in left shoulder: Secondary | ICD-10-CM | POA: Diagnosis not present

## 2016-12-10 DIAGNOSIS — I10 Essential (primary) hypertension: Secondary | ICD-10-CM | POA: Diagnosis not present

## 2017-01-01 ENCOUNTER — Encounter (HOSPITAL_COMMUNITY): Payer: Self-pay | Admitting: *Deleted

## 2017-01-01 ENCOUNTER — Ambulatory Visit (HOSPITAL_COMMUNITY)
Admission: EM | Admit: 2017-01-01 | Discharge: 2017-01-01 | Disposition: A | Discharge status: Home / Self Care | Payer: Medicare HMO | Attending: Family Medicine | Admitting: Family Medicine

## 2017-01-01 DIAGNOSIS — J069 Acute upper respiratory infection, unspecified: Secondary | ICD-10-CM | POA: Diagnosis not present

## 2017-01-01 MED ORDER — IPRATROPIUM BROMIDE 0.06 % NA SOLN: 2.0000 | Freq: Four times a day (QID) | NASAL | 0 refills | Status: DC

## 2017-01-01 MED ORDER — AZITHROMYCIN 250 MG PO TABS: 250.0000 mg | ORAL_TABLET | Freq: Every day | ORAL | 0 refills | Status: DC

## 2017-01-01 MED ORDER — BENZONATATE 100 MG PO CAPS: 100.0000 mg | ORAL_CAPSULE | Freq: Three times a day (TID) | ORAL | 0 refills | Status: DC

## 2017-01-01 NOTE — ED Provider Notes (Signed)
[facility]   [id number] 01/01/17 Arrival Time: [place]:  1. Acute upper respiratory infection     Meds ordered this encounter  Medications  . azithromycin (ZITHROMAX) 250 MG tablet    Sig: Take 1 tablet (250 mg total) by mouth daily. Take first 2 tablets together, then 1 every day until finished.    Dispense:  6 tablet    Refill:  0    Order Specific Question:   Supervising Provider    Answer:   Vanessa Kick [id number]  . benzonatate (TESSALON) 100 MG capsule    Sig: Take 1 capsule (100 mg total) by mouth every 8 (eight) hours.    Dispense:  21 capsule    Refill:  0    Order Specific Question:   Supervising Provider    Answer:   Vanessa Kick [id number]  . ipratropium (ATROVENT) 0.06 % nasal spray    Sig: Place 2 sprays into both nostrils 4 (four) times daily.    Dispense:  15 mL    Refill:  0    Order Specific Question:   Supervising Provider    Answer:   Vanessa Kick [[id number]]    Reviewed expectations re: course of current medical issues. Questions answered. Outlined signs and symptoms indicating need for more acute intervention. Patient verbalized understanding. After Visit Summary given.   SUBJECTIVE:  Ann Riddle is a 83 y.o. female who presents with complaint of URI and cough sx's.  ROS: As per HPI.   OBJECTIVE:  Vitals:   01/01/17 1756  BP: (!) 147/72  Pulse: 84  Resp: 16  Temp: 98.4 F (36.9 C)  TempSrc: Oral  SpO2: 99%    General appearance: alert; no distress Eyes: PERRLA; EOMI; conjunctiva normal HENT: normocephalic; atraumatic; TMs normal; nasal mucosa normal; oral mucosa normal Neck: supple Lungs: clear to auscultation bilaterally Heart: regular rate and rhythm Abdomen: soft, non-tender; bowel sounds normal; no masses or organomegaly; no guarding or rebound tenderness Back: no CVA tenderness Extremities: no cyanosis or edema; symmetrical with no gross deformities Skin: warm and dry Neurologic: normal gait;  normal symmetric reflexes Psychological: alert and cooperative; normal mood and affect  Labs:  Labs Reviewed - No data to display  Imaging: No results found.  Allergies  Allergen Reactions  . Flora-Q     REACTION: unsure    Past Medical History:  Diagnosis Date  . Diaphragmatic hernia without mention of obstruction or gangrene   . Diverticulosis of colon (without mention of hemorrhage)   . Esophageal reflux   . Irritable bowel syndrome   . Other and unspecified hyperlipidemia   . Unspecified essential hypertension   . Ventral hernia, unspecified, without mention of obstruction or gangrene    Social History   Social History  . Marital status: Widowed    Spouse name: N/A  . Number of children: 4  . Years of education: N/A   Occupational History  . retired    Social History Main Topics  . Smoking status: Never Smoker  . Smokeless tobacco: Never Used  . Alcohol use No  . Drug use: No  . Sexual activity: Not on file   Other Topics Concern  . Not on file   Social History Narrative  . No narrative on file   Family History  Problem Relation Age of Onset  . Breast cancer Daughter 39  . Colon cancer Neg Hx    Past Surgical History:  Procedure Laterality Date  . CHOLECYSTECTOMY    .  HIATAL HERNIA REPAIR       Lysbeth Penner, [place] 01/01/17 1815

## 2017-01-01 NOTE — ED Triage Notes (Signed)
Pt  Reports   Cough      sorethroat    With  Phlegm        Symptoms  For  About  1   Week     Headache

## 2017-01-06 ENCOUNTER — Encounter (HOSPITAL_COMMUNITY): Payer: Self-pay | Admitting: Emergency Medicine

## 2017-01-06 ENCOUNTER — Emergency Department (HOSPITAL_COMMUNITY): Payer: Medicare HMO

## 2017-01-06 ENCOUNTER — Emergency Department (HOSPITAL_COMMUNITY)
Admission: EM | Admit: 2017-01-06 | Discharge: 2017-01-06 | Disposition: A | Discharge status: Home / Self Care | Payer: Medicare HMO | Attending: Emergency Medicine | Admitting: Emergency Medicine

## 2017-01-06 ENCOUNTER — Other Ambulatory Visit: Payer: Self-pay

## 2017-01-06 DIAGNOSIS — E876 Hypokalemia: Secondary | ICD-10-CM | POA: Insufficient documentation

## 2017-01-06 DIAGNOSIS — Z79899 Other long term (current) drug therapy: Secondary | ICD-10-CM | POA: Diagnosis not present

## 2017-01-06 DIAGNOSIS — R079 Chest pain, unspecified: Secondary | ICD-10-CM | POA: Diagnosis not present

## 2017-01-06 DIAGNOSIS — R0789 Other chest pain: Secondary | ICD-10-CM | POA: Diagnosis not present

## 2017-01-06 DIAGNOSIS — R072 Precordial pain: Secondary | ICD-10-CM

## 2017-01-06 DIAGNOSIS — R070 Pain in throat: Secondary | ICD-10-CM | POA: Diagnosis not present

## 2017-01-06 DIAGNOSIS — I10 Essential (primary) hypertension: Secondary | ICD-10-CM | POA: Diagnosis not present

## 2017-01-06 DIAGNOSIS — Z8719 Personal history of other diseases of the digestive system: Secondary | ICD-10-CM | POA: Insufficient documentation

## 2017-01-06 DIAGNOSIS — R42 Dizziness and giddiness: Secondary | ICD-10-CM | POA: Diagnosis not present

## 2017-01-06 DIAGNOSIS — Z7982 Long term (current) use of aspirin: Secondary | ICD-10-CM | POA: Insufficient documentation

## 2017-01-06 LAB — BASIC METABOLIC PANEL
ANION GAP: 7 (ref 5–15)
BUN: 15 mg/dL (ref 6–20)
CALCIUM: 8.9 mg/dL (ref 8.9–10.3)
CHLORIDE: 105 mmol/L (ref 101–111)
CO2: 25 mmol/L (ref 22–32)
Creatinine, Ser: 0.92 mg/dL (ref 0.44–1.00)
GFR calc non Af Amer: 52 mL/min — ABNORMAL LOW (ref >60)
GLUCOSE: 142 mg/dL — AB (ref 65–99)
POTASSIUM: 3.4 mmol/L — AB (ref 3.5–5.1)
Sodium: 137 mmol/L (ref 135–145)

## 2017-01-06 LAB — CBC
HEMATOCRIT: 35.8 % — AB (ref 36.0–46.0)
Hemoglobin: 11.9 g/dL — ABNORMAL LOW (ref 12.0–15.0)
MCH: 29 pg (ref 26.0–34.0)
MCHC: 33.2 g/dL (ref 30.0–36.0)
MCV: 87.3 fL (ref 78.0–100.0)
PLATELETS: 217 10*3/uL (ref 150–400)
RBC: 4.1 MIL/uL (ref 3.87–5.11)
RDW: 13.6 % (ref 11.5–15.5)
WBC: 3 10*3/uL — ABNORMAL LOW (ref 4.0–10.5)

## 2017-01-06 LAB — I-STAT TROPONIN, ED
TROPONIN I, POC: 0.01 ng/mL (ref 0.00–0.08)
TROPONIN I, POC: 0 ng/mL (ref 0.00–0.08)

## 2017-01-06 LAB — URINALYSIS, ROUTINE W REFLEX MICROSCOPIC
Bilirubin Urine: NEGATIVE
GLUCOSE, UA: NEGATIVE mg/dL
KETONES UR: NEGATIVE mg/dL
Leukocytes, UA: NEGATIVE
Nitrite: NEGATIVE
PROTEIN: NEGATIVE mg/dL
Specific Gravity, Urine: 1.006 (ref 1.005–1.030)
pH: 6 (ref 5.0–8.0)

## 2017-01-06 LAB — LIPASE, BLOOD: Lipase: 26 U/L (ref 11–51)

## 2017-01-06 LAB — HEPATIC FUNCTION PANEL
ALK PHOS: 64 U/L (ref 38–126)
ALT: 13 U/L — ABNORMAL LOW (ref 14–54)
AST: 18 U/L (ref 15–41)
Albumin: 3.8 g/dL (ref 3.5–5.0)
BILIRUBIN TOTAL: 0.5 mg/dL (ref 0.3–1.2)
Bilirubin, Direct: <0.1 mg/dL — ABNORMAL LOW (ref 0.1–0.5)
Total Protein: 7 g/dL (ref 6.5–8.1)

## 2017-01-06 MED ORDER — POTASSIUM CHLORIDE CRYS ER 20 MEQ PO TBCR
40.0000 meq | EXTENDED_RELEASE_TABLET | Freq: Once | ORAL | Status: AC
  Administered 2017-01-06: 40 meq via ORAL
  Filled 2017-01-06: qty 2

## 2017-01-06 MED ORDER — GI COCKTAIL ~~LOC~~
30.0000 mL | Freq: Once | ORAL | Status: AC
  Administered 2017-01-06: 30 mL via ORAL
  Filled 2017-01-06: qty 30

## 2017-01-06 MED ORDER — PANTOPRAZOLE SODIUM 40 MG IV SOLR
40.0000 mg | Freq: Once | INTRAVENOUS | Status: AC
  Administered 2017-01-06: 40 mg via INTRAVENOUS
  Filled 2017-01-06: qty 40

## 2017-01-06 MED ORDER — PANTOPRAZOLE SODIUM 20 MG PO TBEC
20.0000 mg | DELAYED_RELEASE_TABLET | Freq: Every day | ORAL | 0 refills | Status: DC
Start: 2017-01-06 — End: 2017-05-03

## 2017-01-06 MED ORDER — RANITIDINE HCL 150 MG PO TABS: 150.0000 mg | ORAL_TABLET | Freq: Two times a day (BID) | ORAL | 0 refills | Status: DC

## 2017-01-06 NOTE — ED Provider Notes (Signed)
The patient is a 83 year old female, history of some acid reflux as well as a hiatal hernia, presents to the hospital with a complaint of chest discomfort which has been intermittent for sometime, the patient estimates this has been several weeks but she is not a great historian. Today was clearly not the first day that this has happened but it is reproducibly similar in its occurrence causing a burning sensation in the middle of the chest, she often has a burning sensation in her throat as well and has been having a sore throat recently which prompted a visit to the urgent care. At that time she was told she may have a viral upper respiratory infection but states that all of the medications given to her have not helped her. She denies any swelling in the legs, she denies any symptoms whatsoever at this time and states that her chest pain does not him on with exertion or position but seems to come on randomly. There is no shortness of breath.  On exam the patient has a very slight systolic murmur, she has no rubs or gallops, clear lung sounds, no rales, no significant peripheral edema, no JVD.  EKG reviewed, labs reviewed, second troponin ordered, anticipate discharge with acid reflux-type medications if negative, most certainly needs follow-up in the outpatient setting for further provocative testing as it does not appear that this is yet been done.   EKG Interpretation  Date/Time:  Tuesday January 06 2017 22:21:53 EDT Ventricular Rate:  68 PR Interval:  146 QRS Duration: 79 QT Interval:  419 QTC Calculation: 446 R Axis:   25 Text Interpretation:  Sinus rhythm Abnormal R-wave progression, early transition since last tracing no significant change Confirmed by Noemi Chapel [phone]) on 01/06/2017 10:35:35 PM      Medical screening examination/treatment/procedure(s) were conducted as a shared visit with non-physician practitioner(s) and myself.  I personally evaluated the patient during the  encounter.  Clinical Impression:   Final diagnoses:  Precordial pain  Nonspecific chest pain  Hx of gastroesophageal reflux (GERD)  Hypokalemia         Noemi Chapel, MD 01/07/17 1534

## 2017-01-06 NOTE — ED Triage Notes (Signed)
Pt states she started having CP and Left arm pain that started today. Denies other symptoms.

## 2017-01-06 NOTE — ED Provider Notes (Signed)
[facility] DEPT Provider Note   CSN: [id number] Arrival date & time: 01/06/17  1333     History   Chief Complaint Chief Complaint  Patient presents with  . Chest Pain    HPI Ann Riddle is a 83 y.o. female with a hx of GERD, hypertension, hyperlipidemia, hiatal hernia presents to the Emergency Department complaining of gradual, intermittent, progressively worsening central "burning" chest pain onset 1-2 weeks ago.  Pt reports she has these symptoms 2-3x/week.  Pt reports she is chest pain free at this time.  The last episode was around 10am this morning, lasting until after 1:30pm.  Associated symptoms include nausea, abdominal bloating and generalized weakness.  Pt denies syncope or falling.  She reports no treatments PTA.  She denies known aggravating or alleviating factors.  Pt reports taking her Nexium as directed.  Pt med list includes nitroglycerine.  She reports using the medication 2 days ago during an episode, but it did not help.  Pt denies fever, chills, headache, neck pain, cough, congestion, abd pain, vomiting, diarrhea, weakness, syncope, dysuria.       The history is provided by the patient and medical records. No language interpreter was used.    Past Medical History:  Diagnosis Date  . Diaphragmatic hernia without mention of obstruction or gangrene   . Diverticulosis of colon (without mention of hemorrhage)   . Esophageal reflux   . Irritable bowel syndrome   . Other and unspecified hyperlipidemia   . Unspecified essential hypertension   . Ventral hernia, unspecified, without mention of obstruction or gangrene     Patient Active Problem List   Diagnosis Date Noted  . Chest pain 11/13/2015  . Constipation 11/10/2007  . HYPERLIPIDEMIA 08/04/2007  . Essential hypertension 08/04/2007  . GERD 08/04/2007  . IBS 08/04/2007    Past Surgical History:  Procedure Laterality Date  . CHOLECYSTECTOMY    . HIATAL HERNIA REPAIR      OB History    No data  available       Home Medications    Prior to Admission medications   Medication Sig Start Date End Date Taking? Authorizing Provider  acetaminophen (TYLENOL) 650 MG CR tablet Take 650 mg by mouth every 8 (eight) hours as needed for pain.   Yes [provider]  AMITIZA 24 MCG capsule Take 24 mcg by mouth 2 (two) times daily. 10/28/15  Yes [provider]  aspirin 81 MG tablet Take 81 mg by mouth daily.     Yes [provider]  clonazePAM (KLONOPIN) 0.5 MG tablet Take 0.5 mg by mouth 3 (three) times daily as needed for anxiety (sleep). For anxiety   Yes [provider]  colesevelam (WELCHOL) 625 MG tablet Take 625 mg by mouth daily.    Yes [provider]  docusate sodium (COLACE) 100 MG capsule Take 1 capsule (100 mg total) by mouth every 12 (twelve) hours. 08/18/15  Yes Melony Overly, MD  esomeprazole (NEXIUM) 40 MG capsule Take 1 capsule (40 mg total) by mouth daily at 12 noon. 06/15/13  Yes Milus Banister, MD  hydrocortisone (ANUSOL-HC) 2.5 % rectal cream Apply rectally 2 times daily Patient taking differently: Place 1 application rectally 2 (two) times daily as needed for hemorrhoids. Apply rectally 2 times daily 08/18/15  Yes Melony Overly, MD  ibuprofen (ADVIL,MOTRIN) 400 MG tablet Take 1 tablet (400 mg total) by mouth every 6 (six) hours as needed. Patient taking differently: Take 400 mg by mouth every  6 (six) hours as needed for moderate pain.  05/31/16  Yes Lysbeth Penner, FNP  ipratropium (ATROVENT) 0.06 % nasal spray Place 2 sprays into both nostrils 4 (four) times daily. Patient taking differently: Place 2 sprays into both nostrils 4 (four) times daily as needed for rhinitis.  01/01/17  Yes Lysbeth Penner, FNP  Multiple Vitamins-Minerals (MULTIVITAMIN ADULT PO) Take 1 tablet by mouth daily.   Yes [provider]  Multiple Vitamins-Minerals (PRESERVISION AREDS) CAPS Take 1 capsule by mouth 2 (two) times daily.   Yes [provider]  nitroGLYCERIN (NITROSTAT) 0.4 MG SL tablet Place 1 tablet (0.4 mg total) under the tongue every 5 (five) minutes as needed for chest pain. 11/14/15  Yes Thurnell Lose, MD  olmesartan (BENICAR) 40 MG tablet Take 40 mg by mouth. Tuesday, Thursdays, and Saturdas 11/15/15  Yes [provider]  olmesartan-hydrochlorothiazide (BENICAR HCT) 40-12.5 MG per tablet Take 1 tablet by mouth every other day. Monday, Wednesday, Friday, Sunday   Yes [provider]  ondansetron (ZOFRAN-ODT) 4 MG disintegrating tablet Take 4 mg by mouth every 6 (six) hours as needed. Nausea/vomitting 10/15/15  Yes [provider]  pantoprazole (PROTONIX) 20 MG tablet Take 1 tablet (20 mg total) by mouth daily. 01/06/17   Asiya Cutbirth, Jarrett Soho, PA-C  ranitidine (ZANTAC) 150 MG tablet Take 1 tablet (150 mg total) by mouth 2 (two) times daily. 01/06/17   Ketan Renz, Jarrett Soho, PA-C    Family History Family History  Problem Relation Age of Onset  . Breast cancer Daughter 78  . Colon cancer Neg Hx     Social History Social History  Substance Use Topics  . Smoking status: Never Smoker  . Smokeless tobacco: Never Used  . Alcohol use No     Allergies   Flora-q   Review of Systems Review of Systems  Constitutional: Negative for appetite change, diaphoresis, fatigue, fever and unexpected weight change.  HENT: Positive for sore throat. Negative for mouth sores.   Eyes: Negative for visual disturbance.  Respiratory: Negative for cough, chest tightness, shortness of breath and wheezing.   Cardiovascular: Positive for chest pain.  Gastrointestinal: Negative for abdominal pain, constipation, diarrhea, nausea and vomiting.  Endocrine: Negative for polydipsia, polyphagia and polyuria.  Genitourinary: Negative for dysuria, frequency, hematuria and urgency.  Musculoskeletal: Negative for back pain and neck stiffness.  Skin: Negative for rash.  Allergic/Immunologic: Negative for  immunocompromised state.  Neurological: Positive for light-headedness. Negative for syncope and headaches.  Hematological: Does not bruise/bleed easily.  Psychiatric/Behavioral: Negative for sleep disturbance. The patient is not nervous/anxious.      Physical Exam Updated Vital Signs BP (!) 161/80   Pulse 76   Temp 97.7 F (36.5 C) (Oral)   Resp 12   Wt 63.5 kg (140 lb)   SpO2 100%   BMI 27.34 kg/m   Physical Exam  Constitutional: She appears well-developed and well-nourished. No distress.  Awake, alert, nontoxic appearance  HENT:  Head: Normocephalic and atraumatic.  Mouth/Throat: Oropharynx is clear and moist. No oropharyngeal exudate.  Eyes: Conjunctivae are normal. No scleral icterus.  Neck: Normal range of motion. Neck supple.  Cardiovascular: Normal rate, regular rhythm and intact distal pulses.   Pulmonary/Chest: Effort normal and breath sounds normal. No respiratory distress. She has no wheezes. She exhibits no tenderness.  Equal chest expansion No chest tenderness  Abdominal: Soft. Bowel sounds are normal. She exhibits no distension and no mass. There is no tenderness. There is no rigidity, no rebound, no  guarding and no CVA tenderness.  No abd distension Soft and nontender  Musculoskeletal: Normal range of motion. She exhibits no edema.  Neurological: She is alert.  Speech is clear and goal oriented Moves extremities without ataxia  Skin: Skin is warm and dry. She is not diaphoretic.  Psychiatric: She has a normal mood and affect.  Nursing note and vitals reviewed.    ED Treatments / Results  Labs (all labs ordered are listed, but only abnormal results are displayed) Labs Reviewed  BASIC METABOLIC PANEL - Abnormal; Notable for the following:       Result Value   Potassium 3.4 (*)    Glucose, Bld 142 (*)    GFR calc non Af Amer 52 (*)    All other components within normal limits  CBC - Abnormal; Notable for the following:    WBC 3.0 (*)    Hemoglobin  11.9 (*)    HCT 35.8 (*)    All other components within normal limits  HEPATIC FUNCTION PANEL - Abnormal; Notable for the following:    ALT 13 (*)    Bilirubin, Direct <0.1 (*)    All other components within normal limits  URINALYSIS, ROUTINE W REFLEX MICROSCOPIC - Abnormal; Notable for the following:    Hgb urine dipstick MODERATE (*)    Bacteria, UA RARE (*)    Squamous Epithelial / LPF 0-5 (*)    All other components within normal limits  LIPASE, BLOOD  I-STAT TROPONIN, ED  I-STAT TROPONIN, ED    EKG  EKG Interpretation  Date/Time:  Tuesday January 06 2017 13:37:11 EDT Ventricular Rate:  76 PR Interval:  146 QRS Duration: 66 QT Interval:  388 QTC Calculation: 436 R Axis:   27 Text Interpretation:  Normal sinus rhythm Normal ECG since last tracing no significant change Confirmed by Noemi Chapel [phone]) on 01/06/2017 8:19:57 PM       EKG Interpretation  Date/Time:  Tuesday January 06 2017 22:21:53 EDT Ventricular Rate:  68 PR Interval:  146 QRS Duration: 79 QT Interval:  419 QTC Calculation: 446 R Axis:   25 Text Interpretation:  Sinus rhythm Abnormal R-wave progression, early transition since last tracing no significant change Confirmed by Noemi Chapel [phone]) on 01/06/2017 10:35:35 PM       Radiology Dg Chest 2 View  Result Date: 01/06/2017 CLINICAL DATA:  Central chest pain EXAM: CHEST  2 VIEW COMPARISON:  08/21/2016 FINDINGS: Lungs are clear.  No pleural effusion or pneumothorax. The heart is normal in size. Degenerative changes of the visualized thoracolumbar spine. Cholecystectomy clips. IMPRESSION: No evidence of acute cardiopulmonary disease. Electronically Signed   By: Julian Hy M.D.   On: 01/06/2017 14:12    Procedures Procedures (including critical care time)  Medications Ordered in ED Medications  gi cocktail (Maalox,Lidocaine,Donnatal) (30 mLs Oral Given 01/06/17 2137)  pantoprazole (PROTONIX) injection 40 mg (40 mg Intravenous Given  01/06/17 2228)  potassium chloride SA (K-DUR,KLOR-CON) CR tablet 40 mEq (40 mEq Oral Given 01/06/17 2231)     Initial Impression / Assessment and Plan / ED Course  I have reviewed the triage vital signs and the nursing notes.  Pertinent labs & imaging results that were available during my care of the patient were reviewed by me and considered in my medical decision making (see chart for details).     Patient presents to the emergency department with intermittent chest pain that has been ongoing for several weeks. Pain is described as burning in nature radiating from the  epigastrium upwards. She also complains of burning in her throat She states her symptom are worse at night.She was recently treated for a viral URI with azithromycin which did not help her symptoms.Here today she is well appearing, no tachycardia or bradycardia. Initial and repeat EKG is reassuring and troponin and delta troponin are both negative. Patient has not had any provocative cardiac testing, but did have an echo in 2017 with a normal EF.  Pt symptoms are less likely to be ACS, PE, myocarditis at this time.  Patient is without shortness of breath. No exertional symptoms.  Will change PPI, add H2 blocker and have patient follow with neurology and cardiology for some provocative testing. Discussed reasons to return immediately to the emergency department with both patient and daughter. They state understanding and are in agreement with the plan.  The patient was discussed with and seen by Dr. Noemi Chapel who agrees with the treatment plan.    Final Clinical Impressions(s) / ED Diagnoses   Final diagnoses:  Precordial pain  Nonspecific chest pain  Hx of gastroesophageal reflux (GERD)  Hypokalemia    New Prescriptions New Prescriptions   PANTOPRAZOLE (PROTONIX) 20 MG TABLET    Take 1 tablet (20 mg total) by mouth daily.   RANITIDINE (ZANTAC) 150 MG TABLET    Take 1 tablet (150 mg total) by mouth 2 (two) times daily.      Zaiah Credeur, Gwenlyn Perking 01/06/17 2254    Noemi Chapel, MD 01/07/17 [phone]

## 2017-01-06 NOTE — ED Notes (Signed)
ED Provider at bedside. 

## 2017-01-06 NOTE — Discharge Instructions (Signed)
1. Medications: STOP Nexium, START Protonix and Zantac, usual home medications 2. Treatment: rest, drink plenty of fluids,  3. Follow Up: Please followup with your primary doctor, gastroenterology and cardiology within 1 week for discussion of your diagnoses and further evaluation after today's visit; if you do not have a primary care doctor use the resource guide provided to find one; Please return to the ER for worsening chest pain, chest pain that worsens with walking or associated shortness of breath or any time you feel concerned.

## 2017-01-06 NOTE — ED Notes (Signed)
Pt states understanding of d/c instructions. Prescriptions received. Pt taken out in wheelchair with family. Pt possesses all belongings.

## 2017-01-14 DIAGNOSIS — K219 Gastro-esophageal reflux disease without esophagitis: Secondary | ICD-10-CM | POA: Diagnosis not present

## 2017-03-24 DIAGNOSIS — R1032 Left lower quadrant pain: Secondary | ICD-10-CM | POA: Diagnosis not present

## 2017-03-24 DIAGNOSIS — K5909 Other constipation: Secondary | ICD-10-CM | POA: Diagnosis not present

## 2017-03-24 DIAGNOSIS — K219 Gastro-esophageal reflux disease without esophagitis: Secondary | ICD-10-CM | POA: Diagnosis not present

## 2017-03-24 DIAGNOSIS — Z6826 Body mass index (BMI) 26.0-26.9, adult: Secondary | ICD-10-CM | POA: Diagnosis not present

## 2017-05-02 ENCOUNTER — Other Ambulatory Visit: Payer: Self-pay

## 2017-05-02 ENCOUNTER — Observation Stay (HOSPITAL_COMMUNITY)
Admission: EM | Admit: 2017-05-02 | Discharge: 2017-05-03 | Disposition: A | Discharge status: Home / Self Care | Payer: Medicare HMO | Attending: Internal Medicine | Admitting: Internal Medicine

## 2017-05-02 ENCOUNTER — Emergency Department (HOSPITAL_COMMUNITY): Payer: Medicare HMO

## 2017-05-02 ENCOUNTER — Encounter (HOSPITAL_COMMUNITY): Payer: Self-pay | Admitting: *Deleted

## 2017-05-02 DIAGNOSIS — I10 Essential (primary) hypertension: Secondary | ICD-10-CM

## 2017-05-02 DIAGNOSIS — Z79899 Other long term (current) drug therapy: Secondary | ICD-10-CM | POA: Insufficient documentation

## 2017-05-02 DIAGNOSIS — I700 Atherosclerosis of aorta: Secondary | ICD-10-CM | POA: Insufficient documentation

## 2017-05-02 DIAGNOSIS — K589 Irritable bowel syndrome without diarrhea: Secondary | ICD-10-CM | POA: Diagnosis not present

## 2017-05-02 DIAGNOSIS — Z7982 Long term (current) use of aspirin: Secondary | ICD-10-CM | POA: Diagnosis not present

## 2017-05-02 DIAGNOSIS — R0789 Other chest pain: Principal | ICD-10-CM

## 2017-05-02 DIAGNOSIS — R079 Chest pain, unspecified: Secondary | ICD-10-CM

## 2017-05-02 DIAGNOSIS — R011 Cardiac murmur, unspecified: Secondary | ICD-10-CM | POA: Diagnosis not present

## 2017-05-02 DIAGNOSIS — E785 Hyperlipidemia, unspecified: Secondary | ICD-10-CM | POA: Diagnosis not present

## 2017-05-02 DIAGNOSIS — K219 Gastro-esophageal reflux disease without esophagitis: Secondary | ICD-10-CM

## 2017-05-02 LAB — HEPATIC FUNCTION PANEL
ALBUMIN: 3.4 g/dL — AB (ref 3.5–5.0)
ALK PHOS: 61 U/L (ref 38–126)
ALT: 14 U/L (ref 14–54)
AST: 21 U/L (ref 15–41)
BILIRUBIN TOTAL: 0.6 mg/dL (ref 0.3–1.2)
Bilirubin, Direct: <0.1 mg/dL — ABNORMAL LOW (ref 0.1–0.5)
TOTAL PROTEIN: 6.7 g/dL (ref 6.5–8.1)

## 2017-05-02 LAB — BASIC METABOLIC PANEL
ANION GAP: 6 (ref 5–15)
BUN: 16 mg/dL (ref 6–20)
CHLORIDE: 100 mmol/L — AB (ref 101–111)
CO2: 30 mmol/L (ref 22–32)
Calcium: 9 mg/dL (ref 8.9–10.3)
Creatinine, Ser: 0.96 mg/dL (ref 0.44–1.00)
GFR calc Af Amer: 57 mL/min — ABNORMAL LOW (ref >60)
GFR calc non Af Amer: 49 mL/min — ABNORMAL LOW (ref >60)
GLUCOSE: 87 mg/dL (ref 65–99)
POTASSIUM: 3.4 mmol/L — AB (ref 3.5–5.1)
Sodium: 136 mmol/L (ref 135–145)

## 2017-05-02 LAB — I-STAT TROPONIN, ED
Troponin i, poc: 0.01 ng/mL (ref 0.00–0.08)
Troponin i, poc: 0.02 ng/mL (ref 0.00–0.08)

## 2017-05-02 LAB — CBC
HEMATOCRIT: 36.3 % (ref 36.0–46.0)
HEMOGLOBIN: 11.5 g/dL — AB (ref 12.0–15.0)
MCH: 28.2 pg (ref 26.0–34.0)
MCHC: 31.7 g/dL (ref 30.0–36.0)
MCV: 89 fL (ref 78.0–100.0)
Platelets: 247 10*3/uL (ref 150–400)
RBC: 4.08 MIL/uL (ref 3.87–5.11)
RDW: 13 % (ref 11.5–15.5)
WBC: 4.1 10*3/uL (ref 4.0–10.5)

## 2017-05-02 LAB — TROPONIN I: Troponin I: <0.03 ng/mL (ref <0.03)

## 2017-05-02 LAB — LIPASE, BLOOD: Lipase: 18 U/L (ref 11–51)

## 2017-05-02 MED ORDER — ACETAMINOPHEN 500 MG PO TABS
500.0000 mg | ORAL_TABLET | Freq: Four times a day (QID) | ORAL | Status: DC | PRN
Start: 2017-05-02 — End: 2017-05-02

## 2017-05-02 MED ORDER — GI COCKTAIL ~~LOC~~
30.0000 mL | Freq: Once | ORAL | Status: AC
  Administered 2017-05-02: 30 mL via ORAL
  Filled 2017-05-02: qty 30

## 2017-05-02 MED ORDER — ASPIRIN 81 MG PO CHEW
324.0000 mg | CHEWABLE_TABLET | Freq: Once | ORAL | Status: AC
  Administered 2017-05-02: 324 mg via ORAL
  Filled 2017-05-02: qty 4

## 2017-05-02 MED ORDER — DOCUSATE SODIUM 100 MG PO CAPS
100.0000 mg | ORAL_CAPSULE | Freq: Two times a day (BID) | ORAL | Status: DC
  Administered 2017-05-03 (×2): 100 mg via ORAL
  Filled 2017-05-02 (×2): qty 1

## 2017-05-02 MED ORDER — ASPIRIN EC 81 MG PO TBEC
81.0000 mg | DELAYED_RELEASE_TABLET | Freq: Every day | ORAL | Status: DC
  Administered 2017-05-03: 81 mg via ORAL
  Filled 2017-05-02: qty 1

## 2017-05-02 MED ORDER — COLESEVELAM HCL 625 MG PO TABS
625.0000 mg | ORAL_TABLET | Freq: Every day | ORAL | Status: DC
  Administered 2017-05-03: 625 mg via ORAL
  Filled 2017-05-02: qty 1

## 2017-05-02 MED ORDER — ADULT MULTIVITAMIN W/MINERALS CH
1.0000 | ORAL_TABLET | Freq: Every day | ORAL | Status: DC
  Administered 2017-05-03: 1 via ORAL
  Filled 2017-05-02: qty 1

## 2017-05-02 MED ORDER — SUCRALFATE 1 GM/10ML PO SUSP
1.0000 g | Freq: Three times a day (TID) | ORAL | Status: DC
  Administered 2017-05-02 – 2017-05-03 (×4): 1 g via ORAL
  Filled 2017-05-02 (×5): qty 10

## 2017-05-02 MED ORDER — HYDROCHLOROTHIAZIDE 12.5 MG PO CAPS
12.5000 mg | ORAL_CAPSULE | ORAL | Status: DC
  Administered 2017-05-03: 12.5 mg via ORAL
  Filled 2017-05-02: qty 1

## 2017-05-02 MED ORDER — PANTOPRAZOLE SODIUM 40 MG PO TBEC
40.0000 mg | DELAYED_RELEASE_TABLET | Freq: Every day | ORAL | Status: DC
  Administered 2017-05-03: 40 mg via ORAL
  Filled 2017-05-02: qty 1

## 2017-05-02 MED ORDER — ACETAMINOPHEN 325 MG PO TABS: 650.0000 mg | ORAL_TABLET | ORAL | Status: DC | PRN

## 2017-05-02 MED ORDER — IRBESARTAN 300 MG PO TABS
300.0000 mg | ORAL_TABLET | ORAL | Status: DC
  Administered 2017-05-03: 300 mg via ORAL
  Filled 2017-05-02: qty 1

## 2017-05-02 MED ORDER — LUBIPROSTONE 24 MCG PO CAPS
24.0000 ug | ORAL_CAPSULE | Freq: Two times a day (BID) | ORAL | Status: DC
  Administered 2017-05-03 (×2): 24 ug via ORAL
  Filled 2017-05-02 (×2): qty 1

## 2017-05-02 MED ORDER — MORPHINE SULFATE (PF) 4 MG/ML IV SOLN: 2.0000 mg | INTRAVENOUS | Status: DC | PRN

## 2017-05-02 MED ORDER — OLMESARTAN MEDOXOMIL-HCTZ 40-12.5 MG PO TABS: 1.0000 | ORAL_TABLET | ORAL | Status: DC

## 2017-05-02 MED ORDER — CLONAZEPAM 0.5 MG PO TABS: 0.5000 mg | ORAL_TABLET | Freq: Three times a day (TID) | ORAL | Status: DC | PRN

## 2017-05-02 MED ORDER — ONDANSETRON 4 MG PO TBDP: 4.0000 mg | ORAL_TABLET | Freq: Four times a day (QID) | ORAL | Status: DC | PRN

## 2017-05-02 MED ORDER — GI COCKTAIL ~~LOC~~: 30.0000 mL | Freq: Four times a day (QID) | ORAL | Status: DC | PRN

## 2017-05-02 MED ORDER — ONDANSETRON HCL 4 MG/2ML IJ SOLN
4.0000 mg | Freq: Four times a day (QID) | INTRAMUSCULAR | Status: DC | PRN
  Administered 2017-05-03: 4 mg via INTRAVENOUS
  Filled 2017-05-02: qty 2

## 2017-05-02 NOTE — ED Provider Notes (Signed)
[facility] EMERGENCY DEPARTMENT Provider Note   CSN: [id number] Arrival date & time: 05/02/17  1209     History   Chief Complaint Chief Complaint  Patient presents with  . Chest Pain  . Abdominal Pain    HPI Ann Riddle is a 83 y.o. female.  HPI  Patient with history of hiatal hernia as well as esophageal reflux presents with complaint of burning in her chest and upper abdomen.  She is not a detailed historian but states the symptoms have been ongoing for the past several days.  She has also had a cough and cold and states that her throat has become sore from frequent coughing.  She states she takes her Nexium every day and Zantac at night but does not feel it is relieving her symptoms.  She has had no vomiting.  No shortness of breath.  No nausea.  No diaphoresis.  No radiation of the pain.  No leg swelling.  There are no other associated systemic symptoms, there are no other alleviating or modifying factors.   Past Medical History:  Diagnosis Date  . Diaphragmatic hernia without mention of obstruction or gangrene   . Diverticulosis of colon (without mention of hemorrhage)   . Esophageal reflux   . Irritable bowel syndrome   . Other and unspecified hyperlipidemia   . Unspecified essential hypertension   . Ventral hernia, unspecified, without mention of obstruction or gangrene     Patient Active Problem List   Diagnosis Date Noted  . Chest pain 11/13/2015  . Constipation 11/10/2007  . HYPERLIPIDEMIA 08/04/2007  . Essential hypertension 08/04/2007  . GERD 08/04/2007  . IBS 08/04/2007    Past Surgical History:  Procedure Laterality Date  . CHOLECYSTECTOMY    . HIATAL HERNIA REPAIR      OB History    No data available       Home Medications    Prior to Admission medications   Medication Sig Start Date End Date Taking? Authorizing Provider  acetaminophen (TYLENOL) 500 MG tablet Take 500 mg by mouth every 6 (six) hours as needed for  headache (pain).  02/14/17  Yes [provider]  alendronate (FOSAMAX) 70 MG tablet Take 70 mg by mouth every Monday. 02/10/17  Yes [provider]  aspirin EC 81 MG tablet Take 81 mg by mouth daily.   Yes [provider]  clonazePAM (KLONOPIN) 0.5 MG tablet Take 0.5 mg by mouth 3 (three) times daily as needed for anxiety (sleep).    Yes [provider]  colesevelam (WELCHOL) 625 MG tablet Take 625 mg by mouth daily.    Yes [provider]  docusate sodium (COLACE) 100 MG capsule Take 1 capsule (100 mg total) by mouth every 12 (twelve) hours. 08/18/15  Yes Melony Overly, MD  ENSURE (ENSURE) Take 237 mLs by mouth daily.   Yes [provider]  esomeprazole (NEXIUM) 40 MG capsule Take 1 capsule (40 mg total) by mouth daily at 12 noon. Patient taking differently: Take 40 mg by mouth daily.  06/15/13  Yes Milus Banister, MD  hydrocortisone (ANUSOL-HC) 2.5 % rectal cream Apply rectally 2 times daily Patient taking differently: Place 1 application rectally 2 (two) times daily as needed for hemorrhoids.  08/18/15  Yes Melony Overly, MD  ipratropium (ATROVENT) 0.06 % nasal spray Place 2 sprays into both nostrils 4 (four) times daily. Patient taking differently: Place 2 sprays into both nostrils 4 (four) times daily as needed for  rhinitis.  01/01/17  Yes Lysbeth Penner, FNP  lubiprostone (AMITIZA) 24 MCG capsule Take 24 mcg by mouth 2 (two) times daily with a meal.   Yes [provider]  Multiple Vitamin (MULTIVITAMIN WITH MINERALS) TABS tablet Take 1 tablet by mouth daily.   Yes [provider]  nitroGLYCERIN (NITROSTAT) 0.4 MG SL tablet Place 1 tablet (0.4 mg total) under the tongue every 5 (five) minutes as needed for chest pain. 11/14/15  Yes Thurnell Lose, MD  olmesartan (BENICAR) 40 MG tablet Take 40 mg by mouth See admin instructions. Take 1 tablet (40 mg) by mouth every Tuesday, Thursday, Saturday morning 11/15/15  Yes [provider]  olmesartan-hydrochlorothiazide (BENICAR HCT) 40-12.5 MG per tablet Take 1 tablet by mouth See admin instructions. Take 1 tablet by mouth every Sunday, Monday, Wednesday, Friday morning   Yes [provider]  ondansetron (ZOFRAN-ODT) 4 MG disintegrating tablet Take 4 mg by mouth every 6 (six) hours as needed for nausea or vomiting.  10/15/15  Yes [provider]  ranitidine (ZANTAC) 150 MG tablet Take 1 tablet (150 mg total) by mouth 2 (two) times daily. Patient taking differently: Take 150 mg by mouth at bedtime.  01/06/17  Yes Muthersbaugh, Jarrett Soho, PA-C  ibuprofen (ADVIL,MOTRIN) 400 MG tablet Take 1 tablet (400 mg total) by mouth every 6 (six) hours as needed. Patient not taking: Reported on 05/02/2017 05/31/16   Lysbeth Penner, FNP  pantoprazole (PROTONIX) 20 MG tablet Take 1 tablet (20 mg total) by mouth daily. Patient not taking: Reported on 05/02/2017 01/06/17   Muthersbaugh, Jarrett Soho, PA-C    Family History Family History  Problem Relation Age of Onset  . Breast cancer Daughter 32  . Colon cancer Neg Hx     Social History Social History   Tobacco Use  . Smoking status: Never Smoker  . Smokeless tobacco: Never Used  Substance Use Topics  . Alcohol use: No  . Drug use: No     Allergies   Bacid   Review of Systems Review of Systems  ROS reviewed and all otherwise negative except for mentioned in HPI   Physical Exam Updated Vital Signs BP 138/71   Pulse 73   Temp 98.5 F (36.9 C) (Oral)   Resp 14   SpO2 98%  Vitals reviewed Physical Exam  Physical Examination: General appearance - alert, well appearing, and in no distress Mental status - alert, oriented to person, place, and time Eyes -no conjunctival injection, no scleral icterus Mouth - mucous membranes moist, pharynx normal without lesions Neck - supple, no significant adenopathy Chest - clear to auscultation, no wheezes, rales or rhonchi, symmetric air entry, no reproducible  chest pain Heart - normal rate, regular rhythm, normal S1, S2, no murmurs, rubs, clicks or gallops Abdomen - soft, nontender, nondistended, no masses or organomegaly Neurological - alert, oriented, normal speech Extremities - peripheral pulses normal, no pedal edema, no clubbing or cyanosis Skin - normal coloration and turgor, no rashes   ED Treatments / Results  Labs (all labs ordered are listed, but only abnormal results are displayed) Labs Reviewed  BASIC METABOLIC PANEL - Abnormal; Notable for the following components:      Result Value   Potassium 3.4 (*)    Chloride 100 (*)    GFR calc non Af Amer 49 (*)    GFR calc Af Amer 57 (*)    All other components within normal limits  CBC - Abnormal; Notable for the following components:  Hemoglobin 11.5 (*)    All other components within normal limits  HEPATIC FUNCTION PANEL  LIPASE, BLOOD  I-STAT TROPONIN, ED  I-STAT TROPONIN, ED    EKG   EKG Interpretation  Date/Time:  Saturday May 02 2017 12:28:43 EST Ventricular Rate:  75 PR Interval:  136 QRS Duration: 64 QT Interval:  378 QTC Calculation: 422 R Axis:   21 Text Interpretation:   Poor data quality, interpretation may be adversely affected Normal sinus rhythm T wave abnormality, consider inferior ischemia Abnormal ECG Since previous tracing t wave inversions are new in inferior leads Confirmed by Alfonzo Beers [phone]) on 05/02/2017 4:25:21 PM      Radiology Dg Chest 2 View  Result Date: 05/02/2017 CLINICAL DATA:  Chest pain EXAM: CHEST  2 VIEW COMPARISON:  01/06/2017 FINDINGS: Aortic atherosclerosis. Mild cardiac enlargement. No pleural effusion or edema. No airspace opacities identified. Degenerative changes are noted in both acromioclavicular joints and the right glenohumeral joint. IMPRESSION: 1. No acute cardiopulmonary abnormalities. Electronically Signed   By: Kerby Moors M.D.   On: 05/02/2017 13:26    Procedures Procedures (including critical care  time)  Medications Ordered in ED Medications  sucralfate (CARAFATE) 1 GM/10ML suspension 1 g (not administered)  gi cocktail (Maalox,Lidocaine,Donnatal) (30 mLs Oral Given 05/02/17 1648)  aspirin chewable tablet 324 mg (324 mg Oral Given 05/02/17 1744)     Initial Impression / Assessment and Plan / ED Course  I have reviewed the triage vital signs and the nursing notes.  Pertinent labs & imaging results that were available during my care of the patient were reviewed by me and considered in my medical decision making (see chart for details).  6:20PM- d/w Dr. Shanon Brow, hospitalist, she requests that I discuss with cardiology whether this could be done on outpatient basis or if cards will do stress test on this patient.      6:36 PM d/w cardiology on call.  They state it is reasonable to admit patient for overnight serial troponin rule out to hospitalist service.  They may do stress test tomorrow or may not depending on how patient feels and the results of her troponin rule out. They will see her in consultation if hospitalist requests them to do so.    Heart score of 4.   Final Clinical Impressions(s) / ED Diagnoses   Final diagnoses:  Chest pain, unspecified type    ED Discharge Orders    None       Pixie Casino, MD 05/02/17 2106

## 2017-05-02 NOTE — H&P (Signed)
History and Physical    Ann Riddle [id number] DOB: [birth year] DOA: 05/02/2017  PCP: Velna Hatchet, MD  Patient coming from:  home  Chief Complaint:  Chest pain for a month  HPI: Ann Riddle is a 83 y.o. female with medical history significant of GERD, hernia, HTN, HLD comes in with one month of persistent chest pain and epigastric pain that is worse at night when she lies down.  Denies any vomiting.  The pain is more of a 'burning" sensation.  Does not radiate.  No cough.  No fevers.  No leg edema or swelling.  Pt is compliant with her GI  meds at home.  Pt has had 2 normal troponins in the ED 4 hours apart.  ekg nonishemic.  Pt referred for admission for possible ACS.  Review of Systems: As per HPI otherwise 10 point review of systems negative.   Past Medical History:  Diagnosis Date  . Diaphragmatic hernia without mention of obstruction or gangrene   . Diverticulosis of colon (without mention of hemorrhage)   . Esophageal reflux   . Irritable bowel syndrome   . Other and unspecified hyperlipidemia   . Unspecified essential hypertension   . Ventral hernia, unspecified, without mention of obstruction or gangrene     Past Surgical History:  Procedure Laterality Date  . CHOLECYSTECTOMY    . HIATAL HERNIA REPAIR       reports that  has never smoked. she has never used smokeless tobacco. She reports that she does not drink alcohol or use drugs.  Allergies  Allergen Reactions  . Bacid     Pt does not recall this reaction    Family History  Problem Relation Age of Onset  . Breast cancer Daughter 25  . Colon cancer Neg Hx     Prior to Admission medications   Medication Sig Start Date End Date Taking? Authorizing Provider  acetaminophen (TYLENOL) 500 MG tablet Take 500 mg by mouth every 6 (six) hours as needed for headache (pain).  02/14/17  Yes [provider]  alendronate (FOSAMAX) 70 MG tablet Take 70 mg by mouth every Monday. 02/10/17  Yes [provider]  aspirin EC 81 MG tablet Take 81 mg by mouth daily.   Yes [provider]  clonazePAM (KLONOPIN) 0.5 MG tablet Take 0.5 mg by mouth 3 (three) times daily as needed for anxiety (sleep).    Yes [provider]  colesevelam (WELCHOL) 625 MG tablet Take 625 mg by mouth daily.    Yes [provider]  docusate sodium (COLACE) 100 MG capsule Take 1 capsule (100 mg total) by mouth every 12 (twelve) hours. 08/18/15  Yes Melony Overly, MD  ENSURE (ENSURE) Take 237 mLs by mouth daily.   Yes [provider]  esomeprazole (NEXIUM) 40 MG capsule Take 1 capsule (40 mg total) by mouth daily at 12 noon. Patient taking differently: Take 40 mg by mouth daily.  06/15/13  Yes Milus Banister, MD  hydrocortisone (ANUSOL-HC) 2.5 % rectal cream Apply rectally 2 times daily Patient taking differently: Place 1 application rectally 2 (two) times daily as needed for hemorrhoids.  08/18/15  Yes Melony Overly, MD  ipratropium (ATROVENT) 0.06 % nasal spray Place 2 sprays into both nostrils 4 (four) times daily. Patient taking differently: Place 2 sprays into both nostrils 4 (four) times daily as needed for rhinitis.  01/01/17  Yes Lysbeth Penner, FNP  lubiprostone (AMITIZA) 24 MCG capsule Take 24 mcg by  mouth 2 (two) times daily with a meal.   Yes [provider]  Multiple Vitamin (MULTIVITAMIN WITH MINERALS) TABS tablet Take 1 tablet by mouth daily.   Yes [provider]  nitroGLYCERIN (NITROSTAT) 0.4 MG SL tablet Place 1 tablet (0.4 mg total) under the tongue every 5 (five) minutes as needed for chest pain. 11/14/15  Yes Thurnell Lose, MD  olmesartan (BENICAR) 40 MG tablet Take 40 mg by mouth See admin instructions. Take 1 tablet (40 mg) by mouth every Tuesday, Thursday, Saturday morning 11/15/15  Yes [provider]  olmesartan-hydrochlorothiazide (BENICAR HCT) 40-12.5 MG per tablet Take 1 tablet by mouth See admin instructions. Take 1 tablet by  mouth every Sunday, Monday, Wednesday, Friday morning   Yes [provider]  ondansetron (ZOFRAN-ODT) 4 MG disintegrating tablet Take 4 mg by mouth every 6 (six) hours as needed for nausea or vomiting.  10/15/15  Yes [provider]  ranitidine (ZANTAC) 150 MG tablet Take 1 tablet (150 mg total) by mouth 2 (two) times daily. Patient taking differently: Take 150 mg by mouth at bedtime.  01/06/17  Yes Muthersbaugh, Jarrett Soho, PA-C  ibuprofen (ADVIL,MOTRIN) 400 MG tablet Take 1 tablet (400 mg total) by mouth every 6 (six) hours as needed. Patient not taking: Reported on 05/02/2017 05/31/16   Lysbeth Penner, FNP  pantoprazole (PROTONIX) 20 MG tablet Take 1 tablet (20 mg total) by mouth daily. Patient not taking: Reported on 05/02/2017 01/06/17   Abigail Butts, PA-C    Physical Exam: Vitals:   05/02/17 1700 05/02/17 1730 05/02/17 1800 05/02/17 1830  BP: (!) 151/75 139/66 (!) 147/68 (!) 154/66  Pulse: 76 74 73 76  Resp: 12 14 14 14   Temp:      TempSrc:      SpO2: 98% 96% 97% 97%      Constitutional: NAD, calm, comfortable Vitals:   05/02/17 1700 05/02/17 1730 05/02/17 1800 05/02/17 1830  BP: (!) 151/75 139/66 (!) 147/68 (!) 154/66  Pulse: 76 74 73 76  Resp: 12 14 14 14   Temp:      TempSrc:      SpO2: 98% 96% 97% 97%   Eyes: PERRL, lids and conjunctivae normal ENMT: Mucous membranes are moist. Posterior pharynx clear of any exudate or lesions.Normal dentition.  Neck: normal, supple, no masses, no thyromegaly Respiratory: clear to auscultation bilaterally, no wheezing, no crackles. Normal respiratory effort. No accessory muscle use.  Cardiovascular: Regular rate and rhythm, no murmurs / rubs / gallops. No extremity edema. 2+ pedal pulses. No carotid bruits.  Abdomen: no tenderness, no masses palpated. No hepatosplenomegaly. Bowel sounds positive.  Musculoskeletal: no clubbing / cyanosis. No joint deformity upper and lower extremities. Good ROM, no contractures.  Normal muscle tone.  Skin: no rashes, lesions, ulcers. No induration Neurologic: CN 2-12 grossly intact. Sensation intact, DTR normal. Strength 5/5 in all 4.  Psychiatric: Normal judgment and insight. Alert and oriented x 3. Normal mood.    Labs on Admission: I have personally reviewed following labs and imaging studies  CBC: Recent Labs  Lab 05/02/17 1232  WBC 4.1  HGB 11.5*  HCT 36.3  MCV 89.0  PLT 361   Basic Metabolic Panel: Recent Labs  Lab 05/02/17 1232  NA 136  K 3.4*  CL 100*  CO2 30  GLUCOSE 87  BUN 16  CREATININE 0.96  CALCIUM 9.0   GFR: CrCl cannot be calculated (Unknown ideal weight.). Liver Function Tests: No results for input(s): AST, ALT, ALKPHOS, BILITOT, PROT,  ALBUMIN in the last 168 hours. No results for input(s): LIPASE, AMYLASE in the last 168 hours. No results for input(s): AMMONIA in the last 168 hours. Coagulation Profile: No results for input(s): INR, PROTIME in the last 168 hours. Cardiac Enzymes: No results for input(s): CKTOTAL, CKMB, CKMBINDEX, TROPONINI in the last 168 hours. BNP (last 3 results) No results for input(s): PROBNP in the last 8760 hours. HbA1C: No results for input(s): HGBA1C in the last 72 hours. CBG: No results for input(s): GLUCAP in the last 168 hours. Lipid Profile: No results for input(s): CHOL, HDL, LDLCALC, TRIG, CHOLHDL, LDLDIRECT in the last 72 hours. Thyroid Function Tests: No results for input(s): TSH, T4TOTAL, FREET4, T3FREE, THYROIDAB in the last 72 hours. Anemia Panel: No results for input(s): VITAMINB12, FOLATE, FERRITIN, TIBC, IRON, RETICCTPCT in the last 72 hours. Urine analysis:    Component Value Date/Time   COLORURINE YELLOW 01/06/2017 2114   APPEARANCEUR CLEAR 01/06/2017 2114   LABSPEC 1.006 01/06/2017 2114   PHURINE 6.0 01/06/2017 2114   GLUCOSEU NEGATIVE 01/06/2017 2114   HGBUR MODERATE (A) 01/06/2017 2114   BILIRUBINUR NEGATIVE 01/06/2017 2114   [place] NEGATIVE 01/06/2017 2114    PROTEINUR NEGATIVE 01/06/2017 2114   UROBILINOGEN 0.2 12/25/2014 2036   NITRITE NEGATIVE 01/06/2017 2114   LEUKOCYTESUR NEGATIVE 01/06/2017 2114   Sepsis Labs: !!!!!!!!!!!!!!!!!!!!!!!!!!!!!!!!!!!!!!!!!!!! @LABRCNTIP (procalcitonin:4,lacticidven:4) )No results found for this or any previous visit (from the past 240 hour(s)).   Radiological Exams on Admission: Dg Chest 2 View  Result Date: 05/02/2017 CLINICAL DATA:  Chest pain EXAM: CHEST  2 VIEW COMPARISON:  01/06/2017 FINDINGS: Aortic atherosclerosis. Mild cardiac enlargement. No pleural effusion or edema. No airspace opacities identified. Degenerative changes are noted in both acromioclavicular joints and the right glenohumeral joint. IMPRESSION: 1. No acute cardiopulmonary abnormalities. Electronically Signed   By: Kerby Moors M.D.   On: 05/02/2017 13:26    EKG: Independently reviewed. nsr twi inf leads new Old chart reviewed Case discussed with dr Marcha Dutton in ed  Assessment/Plan 83 yo female with atypical chest pain  Principal Problem:   Chest pain- serial trop.  Cardiac echo in am.  Aspirin.  Cards consulted for further work up with stress testing inpt vs outpt.  Ck lfts /lipase, may be GI related.  New ekg changes.  Active Problems:   Essential hypertension- cont home meds   GERD- gi cocktail.      DVT prophylaxis:  scds Code Status:  full Family Communication: none  Disposition Plan:  Per day team Consults called:  cardiology Admission status:  observation   Makaylin Carlo A MD [facility]  If 7PM-7AM, please contact night-coverage [email or web address] Password [facility]  05/02/2017, 7:18 PM

## 2017-05-02 NOTE — ED Triage Notes (Signed)
Pt reports recent cough and cold symptoms. Has burning pain in her chest and abd. Denies n/v. No relief with acid reflux meds.

## 2017-05-02 NOTE — ED Notes (Signed)
Attempted to call report

## 2017-05-02 NOTE — Consult Note (Signed)
Cardiology Consultation   Patient ID: Ann Riddle; [id number]; [birth year]   Admit date: 05/02/2017 Date of Consult: 05/02/2017  Referring Provider:  Townsend Roger  Primary Care Provider: Velna Hatchet, MD Cardiologist: NA Electrophysiologist:  NA  Reason for Consultation: CP  History of Present Illness: Ann Riddle is a 83 y.o. female who is being seen today for the evaluation of CP at the request of Dr. Townsend Roger. Ms. Durkin has no prior h/o CAD, arrhythmia, HF or any significant prior cardiac history. She does have h/o GERD, HTN, and dyslipidemia.  Pt says she had onset of mid-epigastric "burning" sensation earlier today accompanied by sharp, shooting pain. Onset was at rest; she had been laying in bed. It was similar to prior GERD sx and lasted maybe 1-2 hours before resolving once she arrived at the ED. There was no associated SOB, nausea, or diaphoresis. She has been ill recently w/ URI and cough. She thinks her chest is a little sore from coughing.  She has some mild exertional dyspnea that is not new; no CP w/ exertion.  She has been told in the past that she has a heart murmur.  Past Medical History:  Diagnosis Date  . Diaphragmatic hernia without mention of obstruction or gangrene   . Diverticulosis of colon (without mention of hemorrhage)   . Esophageal reflux   . Irritable bowel syndrome   . Other and unspecified hyperlipidemia   . Unspecified essential hypertension   . Ventral hernia, unspecified, without mention of obstruction or gangrene     Past Surgical History:  Procedure Laterality Date  . CHOLECYSTECTOMY    . HIATAL HERNIA REPAIR      No current facility-administered medications on file prior to encounter.    Current Outpatient Medications on File Prior to Encounter  Medication Sig Dispense Refill  . acetaminophen (TYLENOL) 500 MG tablet Take 500 mg by mouth every 6 (six) hours as needed for headache (pain).     Marland Kitchen alendronate (FOSAMAX) 70 MG  tablet Take 70 mg by mouth every Monday.  3  . aspirin EC 81 MG tablet Take 81 mg by mouth daily.    . clonazePAM (KLONOPIN) 0.5 MG tablet Take 0.5 mg by mouth 3 (three) times daily as needed for anxiety (sleep).     . colesevelam (WELCHOL) 625 MG tablet Take 625 mg by mouth daily.     Marland Kitchen docusate sodium (COLACE) 100 MG capsule Take 1 capsule (100 mg total) by mouth every 12 (twelve) hours. 60 capsule 0  . ENSURE (ENSURE) Take 237 mLs by mouth daily.    Marland Kitchen esomeprazole (NEXIUM) 40 MG capsule Take 1 capsule (40 mg total) by mouth daily at 12 noon. (Patient taking differently: Take 40 mg by mouth daily. ) 30 capsule 11  . hydrocortisone (ANUSOL-HC) 2.5 % rectal cream Apply rectally 2 times daily (Patient taking differently: Place 1 application rectally 2 (two) times daily as needed for hemorrhoids. ) 28.35 g 0  . ipratropium (ATROVENT) 0.06 % nasal spray Place 2 sprays into both nostrils 4 (four) times daily. (Patient taking differently: Place 2 sprays into both nostrils 4 (four) times daily as needed for rhinitis. ) 15 mL 0  . lubiprostone (AMITIZA) 24 MCG capsule Take 24 mcg by mouth 2 (two) times daily with a meal.    . Multiple Vitamin (MULTIVITAMIN WITH MINERALS) TABS tablet Take 1 tablet by mouth daily.    . nitroGLYCERIN (NITROSTAT) 0.4 MG SL tablet Place 1 tablet (0.4 mg total) under  the tongue every 5 (five) minutes as needed for chest pain. 20 tablet 12  . olmesartan (BENICAR) 40 MG tablet Take 40 mg by mouth See admin instructions. Take 1 tablet (40 mg) by mouth every Tuesday, Thursday, Saturday morning  6  . olmesartan-hydrochlorothiazide (BENICAR HCT) 40-12.5 MG per tablet Take 1 tablet by mouth See admin instructions. Take 1 tablet by mouth every Sunday, Monday, Wednesday, Friday morning    . ondansetron (ZOFRAN-ODT) 4 MG disintegrating tablet Take 4 mg by mouth every 6 (six) hours as needed for nausea or vomiting.   1  . ranitidine (ZANTAC) 150 MG tablet Take 1 tablet (150 mg total) by  mouth 2 (two) times daily. (Patient taking differently: Take 150 mg by mouth at bedtime. ) 60 tablet 0  . ibuprofen (ADVIL,MOTRIN) 400 MG tablet Take 1 tablet (400 mg total) by mouth every 6 (six) hours as needed. (Patient not taking: Reported on 05/02/2017) 28 tablet 0  . pantoprazole (PROTONIX) 20 MG tablet Take 1 tablet (20 mg total) by mouth daily. (Patient not taking: Reported on 05/02/2017) 30 tablet 0  . [DISCONTINUED] fexofenadine (ALLEGRA) 180 MG tablet Take 180 mg by mouth daily as needed for allergies.   2      Allergies:    Allergies  Allergen Reactions  . Bacid     Pt does not recall this reaction    Social History:   The patient  reports that  has never smoked. she has never used smokeless tobacco. She reports that she does not drink alcohol or use drugs.    Family History:   The patient's family history includes Breast cancer (age of onset: 56) in her daughter.   ROS:  Please see the history of present illness.  All other ROS reviewed and negative.     Vital Signs: Blood pressure (!) 154/66, pulse 76, temperature 98.5 F (36.9 C), temperature source Oral, resp. rate 14, SpO2 97 %.   PHYSICAL EXAM: General:  Well nourished, well developed, in no acute distress HEENT: normal Lymph: no adenopathy Neck: no JVD Endocrine:  No thryomegaly Vascular: No carotid bruits; DP pulses 2+ bilaterally without bruits  Cardiac:  normal S1, S2; RRR; 2/6 sys murmur at the base  Lungs:  clear to auscultation bilaterally, no wheezing, rhonchi or rales  Abd: soft, nontender, no hepatomegaly  Ext: tr bilateral edema Musculoskeletal:  No deformities, BUE and BLE strength normal and equal Skin: warm and dry  Neuro:  CNs 2-12 intact, no focal abnormalities noted Psych:  Normal affect    EKG:  05-02-17 NSR with TWI in leads III, aVF, new compared to prior tracing of 01-06-17 but also present in past tracing of August 21, 2016 and 08-25-14  Labs: No results for input(s): CKTOTAL, CKMB,  TROPONINI in the last 72 hours. Recent Labs    05/02/17 1657  TROPIPOC 0.02    Lab Results  Component Value Date   WBC 4.1 05/02/2017   HGB 11.5 (L) 05/02/2017   HCT 36.3 05/02/2017   MCV 89.0 05/02/2017   PLT 247 05/02/2017   Recent Labs  Lab 05/02/17 1232  NA 136  K 3.4*  CL 100*  CO2 30  BUN 16  CREATININE 0.96  CALCIUM 9.0  GLUCOSE 87   Lab Results  Component Value Date   CHOL  02/06/2010    187        ATP III CLASSIFICATION:  <200     mg/dL   Desirable  200-239  mg/dL  Borderline High  >=240    mg/dL   High          HDL 62 02/06/2010   LDLCALC (H) 02/06/2010    107        Total Cholesterol/HDL:CHD Risk Coronary Heart Disease Risk Table                     Men   Women  1/2 Average Risk   3.4   3.3  Average Risk       5.0   4.4  2 X Average Risk   9.6   7.1  3 X Average Risk  23.4   11.0        Use the calculated Patient Ratio above and the CHD Risk Table to determine the patient's CHD Risk.        ATP III CLASSIFICATION (LDL):  <100     mg/dL   Optimal  100-129  mg/dL   Near or Above                    Optimal  130-159  mg/dL   Borderline  160-189  mg/dL   High  >190     mg/dL   Very High   TRIG 92 02/06/2010   Lab Results  Component Value Date   DDIMER  02/05/2010    0.33        AT THE INHOUSE ESTABLISHED CUTOFF VALUE OF 0.48 ug/mL FEU, THIS ASSAY HAS BEEN DOCUMENTED IN THE LITERATURE TO HAVE A SENSITIVITY AND NEGATIVE PREDICTIVE VALUE OF AT LEAST 98 TO 99%.  THE TEST RESULT SHOULD BE CORRELATED WITH AN ASSESSMENT OF THE CLINICAL PROBABILITY OF DVT / VTE.    Radiology/Studies:  Dg Chest 2 View  Result Date: 05/02/2017 CLINICAL DATA:  Chest pain EXAM: CHEST  2 VIEW COMPARISON:  01/06/2017 FINDINGS: Aortic atherosclerosis. Mild cardiac enlargement. No pleural effusion or edema. No airspace opacities identified. Degenerative changes are noted in both acromioclavicular joints and the right glenohumeral joint. IMPRESSION: 1. No acute  cardiopulmonary abnormalities. Electronically Signed   By: Kerby Moors M.D.   On: 05/02/2017 13:26    ASSESSMENT AND PLAN:  1. CP: atypical, may be related to GI etiology in this 83yo w/o any prior h/o cardiac problems. Agree w/ obst to r/o ACS w/ serial troponins. TWI on EKG new compared to immediate preceding tracing but have been present in the past. Could consider ischemia w/u if pt has continued CP, TTE is abnormal, or rules in by enzymes.   2. Murmur and mild DOE: will obtain baseline routine TTE  3. HTN: BP mildly elevated. Recommend cont home regimen for now and assess need for med changes as needed  4. Dyslipidemia: cont home regimen  Thank you for the opportunity to participate in the care of this very pleasant patient. Will follow. Please call w/ questions.   Rudean Curt, MD , [facility] 05/02/17 9:02 PM

## 2017-05-03 ENCOUNTER — Observation Stay (HOSPITAL_COMMUNITY): Payer: Medicare HMO

## 2017-05-03 ENCOUNTER — Encounter (HOSPITAL_COMMUNITY): Payer: Self-pay | Admitting: *Deleted

## 2017-05-03 ENCOUNTER — Other Ambulatory Visit: Payer: Self-pay

## 2017-05-03 DIAGNOSIS — K5730 Diverticulosis of large intestine without perforation or abscess without bleeding: Secondary | ICD-10-CM | POA: Diagnosis not present

## 2017-05-03 DIAGNOSIS — I10 Essential (primary) hypertension: Secondary | ICD-10-CM | POA: Diagnosis not present

## 2017-05-03 DIAGNOSIS — K219 Gastro-esophageal reflux disease without esophagitis: Secondary | ICD-10-CM | POA: Diagnosis not present

## 2017-05-03 DIAGNOSIS — R011 Cardiac murmur, unspecified: Secondary | ICD-10-CM | POA: Diagnosis not present

## 2017-05-03 DIAGNOSIS — R0789 Other chest pain: Secondary | ICD-10-CM | POA: Diagnosis not present

## 2017-05-03 LAB — ECHOCARDIOGRAM COMPLETE
HEIGHTINCHES: 60 in
WEIGHTICAEL: 2176 [oz_av]

## 2017-05-03 LAB — TROPONIN I

## 2017-05-03 MED ORDER — POLYETHYLENE GLYCOL 3350 17 G PO PACK
17.0000 g | PACK | Freq: Once | ORAL | Status: AC
  Administered 2017-05-03: 17 g via ORAL
  Filled 2017-05-03: qty 1

## 2017-05-03 MED ORDER — IOPAMIDOL (ISOVUE-300) INJECTION 61%
15.0000 mL | INTRAVENOUS | Status: AC
  Administered 2017-05-03 (×2): 15 mL via ORAL

## 2017-05-03 MED ORDER — SUCRALFATE 1 GM/10ML PO SUSP: 1.0000 g | Freq: Three times a day (TID) | ORAL | 0 refills | Status: DC

## 2017-05-03 MED ORDER — IOPAMIDOL (ISOVUE-300) INJECTION 61%
INTRAVENOUS | Status: AC
  Administered 2017-05-03: 100 mL via INTRAVENOUS
  Filled 2017-05-03: qty 100

## 2017-05-03 NOTE — Plan of Care (Signed)
  Activity: Risk for activity intolerance will decrease 05/03/2017 0143 - Completed/Met by Evert Kohl, RN

## 2017-05-03 NOTE — Progress Notes (Signed)
Patient ID: Ann Riddle, female   DOB: Dec 12, 1921, 83 y.o.   MRN: [id number]                                                                PROGRESS NOTE                                                                                                                                                                                                             Patient Demographics:    Ann Riddle, is a 83 y.o. female, DOB - January 12, 1922, QQP:[id number]  Admit date - 05/02/2017   Admitting Physician Phillips Grout, MD  Outpatient Primary MD for the patient is Velna Hatchet, MD  LOS - 0  Outpatient Specialists:     Chief Complaint  Patient presents with  . Chest Pain  . Abdominal Pain       Brief Narrative     83 y.o. female with medical history significant of GERD, hernia, HTN, HLD comes in with one month of persistent chest pain and epigastric pain that is worse at night when she lies down.  Denies any vomiting.  The pain is more of a 'burning" sensation.  Does not radiate.  No cough.  No fevers.  No leg edema or swelling.  Pt is compliant with her GI  meds at home.  Pt has had 2 normal troponins in the ED 4 hours apart.  ekg nonishemic.  Pt referred for admission for possible ACS.     Subjective:    Ann Riddle today  States not having chest pain,  Having some epigastric discomfort with radiation to the back,  Notes slight 10+ lbs weight loss.  denies fever, chills, cough, palp, sob, n/v, diarrhea, brbpr, black stool     Assessment  & Plan :    Principal Problem:   Chest pain Active Problems:   Essential hypertension   GERD   Chest pain "burning" Awaiting cardiac echo and also further cardiology input  Abdominal pain + weight loss Continue protonix, and sucralfate STOP FOSAMAX, AVOID NSAIDS Check CT scan abd/ pelvis  Hypertension Continue current bp medications    Code Status : FULL CODE  Family Communication  : w patient  Disposition Plan  : home  Barriers  For Discharge : awaiting cardiology recommendations  Consults  :  cardiology  Procedures  :   DVT Prophylaxis  :    Heparin - SCDs   Lab Results  Component Value Date   PLT 247 05/02/2017    Antibiotics  :  none  Anti-infectives (From admission, onward)   None        Objective:   Vitals:   05/02/17 2200 05/02/17 2230 05/02/17 2353 05/03/17 0655  BP: (!) 137/58 134/63 (!) 149/58 131/66  Pulse: 68 72 71 80  Resp: 17 15 16 16   Temp:   98.4 F (36.9 C) 98.6 F (37 C)  TempSrc:   Oral Oral  SpO2: 100% 100% 99% 96%  Weight:   61.7 kg (136 lb)   Height:   5' (1.524 m)     Wt Readings from Last 3 Encounters:  05/02/17 61.7 kg (136 lb)  01/06/17 63.5 kg (140 lb)  11/13/15 63.5 kg (140 lb)     Intake/Output Summary (Last 24 hours) at 05/03/2017 0806 Last data filed at 05/03/2017 0000 Gross per 24 hour  Intake 240 ml  Output 200 ml  Net 40 ml     Physical Exam  Awake Alert, Oriented X 3, No new F.N deficits, Normal affect [place].AT,PERRAL Supple Neck,No JVD, No cervical lymphadenopathy appriciated.  Symmetrical Chest wall movement, Good air movement bilaterally, CTAB RRR,s1 s2, 2/6 sem rusb w/o radiation, apex +ve B.Sounds, Abd Soft, No tenderness, No organomegaly appriciated, No rebound - guarding or rigidity. No Cyanosis, Clubbing or edema, No new Rash or bruise     Data Review:    CBC Recent Labs  Lab 05/02/17 1232  WBC 4.1  HGB 11.5*  HCT 36.3  PLT 247  MCV 89.0  MCH 28.2  MCHC 31.7  RDW 13.0    Chemistries  Recent Labs  Lab 05/02/17 1232 05/02/17 1942  NA 136  --   K 3.4*  --   CL 100*  --   CO2 30  --   GLUCOSE 87  --   BUN 16  --   CREATININE 0.96  --   CALCIUM 9.0  --   AST  --  21  ALT  --  14  ALKPHOS  --  61  BILITOT  --  0.6   ------------------------------------------------------------------------------------------------------------------ No results for input(s): CHOL, HDL, LDLCALC, TRIG, CHOLHDL, LDLDIRECT in the last  72 hours.  No results found for: HGBA1C ------------------------------------------------------------------------------------------------------------------ No results for input(s): TSH, T4TOTAL, T3FREE, THYROIDAB in the last 72 hours.  Invalid input(s): FREET3 ------------------------------------------------------------------------------------------------------------------ No results for input(s): VITAMINB12, FOLATE, FERRITIN, TIBC, IRON, RETICCTPCT in the last 72 hours.  Coagulation profile No results for input(s): INR, PROTIME in the last 168 hours.  No results for input(s): DDIMER in the last 72 hours.  Cardiac Enzymes Recent Labs  Lab 05/02/17 2223 05/03/17 0410  TROPONINI <0.03 <0.03   ------------------------------------------------------------------------------------------------------------------ No results found for: BNP  Inpatient Medications  Scheduled Meds: . aspirin EC  81 mg Oral Daily  . colesevelam  625 mg Oral Daily  . docusate sodium  100 mg Oral Q12H  . irbesartan  300 mg Oral Once per day on Sun Mon Wed Fri   And  . hydrochlorothiazide  12.5 mg Oral Once per day on Sun Mon Wed Fri  . lubiprostone  24 mcg Oral BID WC  . multivitamin with minerals  1 tablet Oral Daily  . pantoprazole  40 mg Oral Daily  . sucralfate  1 g Oral TID WC & HS   Continuous Infusions:  PRN Meds:.acetaminophen, clonazePAM, gi cocktail, morphine injection, ondansetron (ZOFRAN) IV, ondansetron  Micro Results No results found for this or any previous visit (from the past 240 hour(s)).  Radiology Reports Dg Chest 2 View  Result Date: 05/02/2017 CLINICAL DATA:  Chest pain EXAM: CHEST  2 VIEW COMPARISON:  01/06/2017 FINDINGS: Aortic atherosclerosis. Mild cardiac enlargement. No pleural effusion or edema. No airspace opacities identified. Degenerative changes are noted in both acromioclavicular joints and the right glenohumeral joint. IMPRESSION: 1. No acute cardiopulmonary  abnormalities. Electronically Signed   By: Kerby Moors M.D.   On: 05/02/2017 13:26    Time Spent in minutes  30   Jani Gravel M.D on 05/03/2017 at 8:06 AM  Between 7am to 7pm - Pager - [phone]  After 7pm go to [email or web address] - password [facility]  [facility] -  Office  [phone]

## 2017-05-03 NOTE — Discharge Summary (Signed)
Ann Riddle, is a 83 y.o. female  DOB 06/23/1921  MRN [id number].  Admission date:  05/02/2017  Admitting Physician  Phillips Grout, MD  Discharge Date:  05/03/2017   Primary MD  Velna Hatchet, MD  Recommendations for primary care physician for things to follow:   Chest pain Atypical likely related to gerd Avoid NSAIDS Fosamax Stopped Cont Nexium, cont Zantac Started Sucralfate F/u with pcp in 1-2 weeks  Mild Mitral stenosis / Mitral regurgitation Please arrange outpatient cardiology follow up   Admission Diagnosis  Chest pain, unspecified type [R07.9]   Discharge Diagnosis  Chest pain, unspecified type [R07.9]      Principal Problem:   Chest pain Active Problems:   Essential hypertension   GERD      Past Medical History:  Diagnosis Date  . Diaphragmatic hernia without mention of obstruction or gangrene   . Diverticulosis of colon (without mention of hemorrhage)   . Esophageal reflux   . Irritable bowel syndrome   . Other and unspecified hyperlipidemia   . Unspecified essential hypertension   . Ventral hernia, unspecified, without mention of obstruction or gangrene     Past Surgical History:  Procedure Laterality Date  . CHOLECYSTECTOMY    . HIATAL HERNIA REPAIR         HPI  from the history and physical done on the day of admission:     83 y.o. female with medical history significant of GERD, hernia, HTN, HLD comes in with one month of persistent chest pain and epigastric pain that is worse at night when she lies down.  Denies any vomiting.  The pain is more of a 'burning" sensation.  Does not radiate.  No cough.  No fevers.  No leg edema or swelling.  Pt is compliant with her GI  meds at home.  Pt has had 2 normal troponins in the ED 4 hours apart.  ekg nonishemic.  Pt referred for admission for possible ACS.      Hospital Course:     Pt was admitted for chest  pain which seemed more likely to be related to gerd.  CT scan abd/ pelvis was done due to  Complaints of weight loss as well as epigastric discomfort radiating to the back.    CT scan abd pelvis (05/03/2017)=>   IMPRESSION: 1. Mild circumferential wall thickening in the antral pyloric region the stomach, nonspecific, differential includes gastritis, peptic ulcer disease or neoplasm. No significant gastric distention . 2. Large periampullary duodenum diverticulum. Cholecystectomy. Bile ducts are within normal post cholecystectomy limits. CBD diameter 6 mm. 3. Minimal sigmoid diverticulosis. 4.  Aortic Atherosclerosis (ICD10-I70.0).  Coronary atherosclerosis. 5. Mildly enlarged myomatous uterus.   Pt was seen by cardiology who also thought that her chest pain was atypical.  Cardiac echo  (05/03/2017)=> - Left ventricle: The cavity size was normal. Wall thickness was   increased in a pattern of moderate LVH. Systolic function was   vigorous. The estimated ejection fraction was in the range  of 75%   to 80%. There was dynamic obstruction in the mid cavity, with a   peak velocity of 350 cm/sec and a peak gradient of 49 mm Hg. Wall   motion was normal; there were no regional wall motion   abnormalities. Doppler parameters are consistent with abnormal   left ventricular relaxation (grade 1 diastolic dysfunction). - Aortic valve: Trileaflet; normal thickness leaflets. Valve area   (VTI): 1.59 cm^2. Valve area (Vmax): 1.43 cm^2. Valve area   (Vmean): 1.66 cm^2. - Mitral valve: Moderately to severely calcified annulus. Mildly   calcified leaflets . The findings are consistent with mild   stenosis. There was mild regurgitation. Valve area by pressure   half-time: 1.75 cm^2. Valve area by continuity equation (using   LVOT flow): 1.42 cm^2. - Pulmonary arteries: PA peak pressure: 34 mm Hg (S).  Trop negative x3. Pt had sucralfate started during this admission and appears to be feeling better.   Pt is currently chest pain free and will be discharged home.    Follow UP  Follow-up Information    Velna Hatchet, MD Follow up in 1 week(s).   Specialty:  Internal Medicine Contact information: [address] [place] [address] [phone]            Consults obtained - cardiology  Discharge Condition: stable  Diet and Activity recommendation: See Discharge Instructions below  Discharge Instructions         Discharge Medications     Allergies as of 05/03/2017      Reactions   Bacid    Pt does not recall this reaction      Medication List    STOP taking these medications   alendronate 70 MG tablet Commonly known as:  FOSAMAX   ibuprofen 400 MG tablet Commonly known as:  ADVIL,MOTRIN   olmesartan 40 MG tablet Commonly known as:  BENICAR   pantoprazole 20 MG tablet Commonly known as:  PROTONIX     TAKE these medications   acetaminophen 500 MG tablet Commonly known as:  TYLENOL Take 500 mg by mouth every 6 (six) hours as needed for headache (pain).   aspirin EC 81 MG tablet Take 81 mg by mouth daily.   clonazePAM 0.5 MG tablet Commonly known as:  KLONOPIN Take 0.5 mg by mouth 3 (three) times daily as needed for anxiety (sleep).   colesevelam 625 MG tablet Commonly known as:  WELCHOL Take 625 mg by mouth daily.   docusate sodium 100 MG capsule Commonly known as:  COLACE Take 1 capsule (100 mg total) by mouth every 12 (twelve) hours.   ENSURE Take 237 mLs by mouth daily.   esomeprazole 40 MG capsule Commonly known as:  NEXIUM Take 1 capsule (40 mg total) by mouth daily at 12 noon. What changed:  when to take this   hydrocortisone 2.5 % rectal cream Commonly known as:  ANUSOL-HC Apply rectally 2 times daily What changed:    how much to take  how to take this  when to take this  reasons to take this  additional instructions   ipratropium 0.06 % nasal spray Commonly known as:  ATROVENT Place 2 sprays into both  nostrils 4 (four) times daily. What changed:    when to take this  reasons to take this   lubiprostone 24 MCG capsule Commonly known as:  AMITIZA Take 24 mcg by mouth 2 (two) times daily with a meal.   multivitamin with minerals Tabs tablet Take 1 tablet by mouth  daily.   nitroGLYCERIN 0.4 MG SL tablet Commonly known as:  NITROSTAT Place 1 tablet (0.4 mg total) under the tongue every 5 (five) minutes as needed for chest pain.   olmesartan-hydrochlorothiazide 40-12.5 MG tablet Commonly known as:  BENICAR HCT Take 1 tablet by mouth See admin instructions. Take 1 tablet by mouth every Sunday, Monday, Wednesday, Friday morning   ondansetron 4 MG disintegrating tablet Commonly known as:  ZOFRAN-ODT Take 4 mg by mouth every 6 (six) hours as needed for nausea or vomiting.   ranitidine 150 MG tablet Commonly known as:  ZANTAC Take 1 tablet (150 mg total) by mouth 2 (two) times daily. What changed:  when to take this   sucralfate 1 GM/10ML suspension Commonly known as:  CARAFATE Take 10 mLs (1 g total) by mouth 4 (four) times daily -  with meals and at bedtime.       Major procedures and Radiology Reports - PLEASE review detailed and final reports for all details, in brief -      Dg Chest 2 View  Result Date: 05/02/2017 CLINICAL DATA:  Chest pain EXAM: CHEST  2 VIEW COMPARISON:  01/06/2017 FINDINGS: Aortic atherosclerosis. Mild cardiac enlargement. No pleural effusion or edema. No airspace opacities identified. Degenerative changes are noted in both acromioclavicular joints and the right glenohumeral joint. IMPRESSION: 1. No acute cardiopulmonary abnormalities. Electronically Signed   By: Kerby Moors M.D.   On: 05/02/2017 13:26   Ct Abdomen Pelvis W Contrast  Result Date: 05/03/2017 CLINICAL DATA:  Inpatient. Chest and epigastric pain radiating to the back. 10 pound weight loss. Prior cholecystectomy and hiatal hernia repair. EXAM: CT ABDOMEN AND PELVIS WITH CONTRAST  TECHNIQUE: Multidetector CT imaging of the abdomen and pelvis was performed using the standard protocol following bolus administration of intravenous contrast. CONTRAST:  < 100 cc > ISOVUE-300 IOPAMIDOL (ISOVUE-300) INJECTION 61% COMPARISON:  04/09/2015 CT abdomen/pelvis. FINDINGS: Lower chest: No significant pulmonary nodules or acute consolidative airspace disease. Coronary atherosclerosis. Hepatobiliary: Normal liver size. No liver mass. Cholecystectomy. Bile ducts are within normal post cholecystectomy limits with common bile duct diameter 6 mm. Large periampullary duodenal diverticulum. Pancreas: Normal, with no mass or duct dilation. Spleen: Normal size. No mass. Adrenals/Urinary Tract: Normal adrenals. No hydronephrosis. Several simple left renal cysts, largest 2.9 cm in the lower left kidney. Additional subcentimeter hypodense left renal cortical lesions are too small to characterize and require no follow-up. Normal bladder. Stomach/Bowel: There is mild circumferential wall thickening in the antral pyloric region of the stomach. No significant gastric distention. Normal caliber small bowel with no small bowel wall thickening. Normal appendix. Minimal sigmoid diverticulosis, with no large bowel wall thickening or pericolonic fat stranding. Oral contrast reaches the descending colon. Vascular/Lymphatic: Atherosclerotic nonaneurysmal abdominal aorta. Patent portal, splenic, hepatic and renal veins. No pathologically enlarged lymph nodes in the abdomen or pelvis. Reproductive: Stable mildly enlarged partially calcified myomatous uterus. Stable coarse left ovarian calcifications. No adnexal masses. Other: No pneumoperitoneum, ascites or focal fluid collection. Musculoskeletal: No aggressive appearing focal osseous lesions. Moderate thoracolumbar spondylosis. IMPRESSION: 1. Mild circumferential wall thickening in the antral pyloric region the stomach, nonspecific, differential includes gastritis, peptic ulcer  disease or neoplasm. No significant gastric distention . 2. Large periampullary duodenum diverticulum. Cholecystectomy. Bile ducts are within normal post cholecystectomy limits. CBD diameter 6 mm. 3. Minimal sigmoid diverticulosis. 4.  Aortic Atherosclerosis (ICD10-I70.0).  Coronary atherosclerosis. 5. Mildly enlarged myomatous uterus. Electronically Signed   By: Ilona Sorrel M.D.   On: 05/03/2017 14:35  Micro Results    No results found for this or any previous visit (from the past 240 hour(s)).     Today   Subjective    Ann Riddle today has no current chest pain.   No headache,No abdominal pain,no new weakness tingling or numbness, feels much better wants to go home today.    Objective   Blood pressure 133/64, pulse 82, temperature 98.3 F (36.8 C), temperature source Oral, resp. rate 18, height 5' (1.524 m), weight 61.7 kg (136 lb), SpO2 98 %.   Intake/Output Summary (Last 24 hours) at 05/03/2017 1605 Last data filed at 05/03/2017 0921 Gross per 24 hour  Intake 480 ml  Output 500 ml  Net -20 ml    Exam Awake Alert, Oriented x 3, No new F.N deficits, Normal affect [place].AT,PERRAL Supple Neck,No JVD, No cervical lymphadenopathy appriciated.  Symmetrical Chest wall movement, Good air movement bilaterally, CTAB RRR,No Gallops,Rubs or new Murmurs, No Parasternal Heave +ve B.Sounds, Abd Soft, Non tender, No organomegaly appriciated, No rebound -guarding or rigidity. No Cyanosis, Clubbing or edema, No new Rash or bruise   Data Review   CBC w Diff:  Lab Results  Component Value Date   WBC 4.1 05/02/2017   HGB 11.5 (L) 05/02/2017   HCT 36.3 05/02/2017   PLT 247 05/02/2017   LYMPHOPCT 34 04/06/2014   MONOPCT 8 04/06/2014   EOSPCT 2 04/06/2014   BASOPCT 1 04/06/2014    CMP:  Lab Results  Component Value Date   NA 136 05/02/2017   K 3.4 (L) 05/02/2017   CL 100 (L) 05/02/2017   CO2 30 05/02/2017   BUN 16 05/02/2017   CREATININE 0.96 05/02/2017   PROT 6.7  05/02/2017   ALBUMIN 3.4 (L) 05/02/2017   BILITOT 0.6 05/02/2017   ALKPHOS 61 05/02/2017   AST 21 05/02/2017   ALT 14 05/02/2017  .   Total Time in preparing paper work, data evaluation and todays exam - 2 minutes  Jani Gravel M.D on 05/03/2017 at [facility]   Office  [phone]

## 2017-05-03 NOTE — Plan of Care (Signed)
  Health Behavior/Discharge Planning: Ability to manage health-related needs will improve 05/03/2017 1644 - Completed/Met by Ames Dura, RN   Clinical Measurements: Ability to maintain clinical measurements within normal limits will improve 05/03/2017 1644 - Completed/Met by Ames Dura, RN Will remain free from infection 05/03/2017 1644 - Completed/Met by Ames Dura, RN Diagnostic test results will improve 05/03/2017 1644 - Completed/Met by Ames Dura, RN Respiratory complications will improve 05/03/2017 1644 - Completed/Met by Ames Dura, RN Cardiovascular complication will be avoided 05/03/2017 1644 - Completed/Met by Ames Dura, RN   Nutrition: Adequate nutrition will be maintained 05/03/2017 1644 - Completed/Met by Ames Dura, RN   Coping: Level of anxiety will decrease 05/03/2017 1644 - Completed/Met by Ames Dura, RN   Elimination: Will not experience complications related to bowel motility 05/03/2017 1644 - Completed/Met by Ames Dura, RN Will not experience complications related to urinary retention 05/03/2017 1644 - Completed/Met by Ames Dura, RN   Pain Managment: General experience of comfort will improve 05/03/2017 1644 - Completed/Met by Ames Dura, RN   Safety: Ability to remain free from injury will improve 05/03/2017 1644 - Completed/Met by Ames Dura, RN   Skin Integrity: Risk for impaired skin integrity will decrease 05/03/2017 1644 - Completed/Met by Ames Dura, RN   Education: Understanding of cardiac disease, CV risk reduction, and recovery process will improve 05/03/2017 1644 - Completed/Met by Ames Dura, RN   Activity: Ability to tolerate increased activity will improve 05/03/2017 1644 - Completed/Met by Ames Dura, RN   Cardiac: Ability to achieve and maintain adequate cardiovascular perfusion will improve 05/03/2017 1644  - Completed/Met by Ames Dura, RN   Health Behavior/Discharge Planning: Ability to safely manage health-related needs after discharge will improve 05/03/2017 1644 - Completed/Met by Ames Dura, RN

## 2017-05-03 NOTE — Progress Notes (Signed)
Progress Note   Subjective   Doing well today, the patient denies CP or SOB.  No new concerns  Inpatient Medications    Scheduled Meds: . aspirin EC  81 mg Oral Daily  . colesevelam  625 mg Oral Daily  . docusate sodium  100 mg Oral Q12H  . irbesartan  300 mg Oral Once per day on Sun Mon Wed Fri   And  . hydrochlorothiazide  12.5 mg Oral Once per day on Sun Mon Wed Fri  . lubiprostone  24 mcg Oral BID WC  . multivitamin with minerals  1 tablet Oral Daily  . pantoprazole  40 mg Oral Daily  . sucralfate  1 g Oral TID WC & HS   Continuous Infusions:  PRN Meds: acetaminophen, clonazePAM, gi cocktail, morphine injection, ondansetron (ZOFRAN) IV, ondansetron   Vital Signs    Vitals:   05/02/17 2200 05/02/17 2230 05/02/17 2353 05/03/17 0655  BP: (!) 137/58 134/63 (!) 149/58 131/66  Pulse: 68 72 71 80  Resp: 17 15 16 16   Temp:   98.4 F (36.9 C) 98.6 F (37 C)  TempSrc:   Oral Oral  SpO2: 100% 100% 99% 96%  Weight:   136 lb (61.7 kg)   Height:   5' (1.524 m)     Intake/Output Summary (Last 24 hours) at 05/03/2017 1158 Last data filed at 05/03/2017 0921 Gross per 24 hour  Intake 480 ml  Output 500 ml  Net -20 ml   Filed Weights   05/02/17 2353  Weight: 136 lb (61.7 kg)    Physical Exam   GEN- The patient is well appearing, alert and oriented x 3 today.  Ambulatory in the room, without complaint Head- normocephalic, atraumatic Eyes-  Sclera clear, conjunctiva pink Ears- hearing intact Oropharynx- clear Neck- supple, Lungs-   normal work of breathing Heart- Regular rate and rhythm  GI- soft,  Extremities- no clubbing, cyanosis, or edema  MS- age appropriate atrophy Skin- no rash or lesion Psych- euthymic mood, full affect Neuro- strength and sensation are intact   Labs    Chemistry Recent Labs  Lab 05/02/17 1232 05/02/17 1942  NA 136  --   K 3.4*  --   CL 100*  --   CO2 30  --   GLUCOSE 87  --   BUN 16  --   CREATININE 0.96  --   CALCIUM  9.0  --   PROT  --  6.7  ALBUMIN  --  3.4*  AST  --  21  ALT  --  14  ALKPHOS  --  61  BILITOT  --  0.6  GFRNONAA 49*  --   GFRAA 57*  --   ANIONGAP 6  --      Hematology Recent Labs  Lab 05/02/17 1232  WBC 4.1  RBC 4.08  HGB 11.5*  HCT 36.3  MCV 89.0  MCH 28.2  MCHC 31.7  RDW 13.0  PLT 247    Cardiac Enzymes Recent Labs  Lab 05/02/17 2223 05/03/17 0410  TROPONINI <0.03 <0.03    Recent Labs  Lab 05/02/17 1309 05/02/17 1657  TROPIPOC 0.01 0.02        Assessment & Plan    1.  Chest pain Atypical in presentation and likely GI in etiology.  Would pursue GI workup/ management as primary approach Given advanced age, a conservative approach is advised.   Awaiting echo.  If normal, would plan outpatient myoview  2. HTN Stable No change  required today   Thompson Grayer MD, [facility] 05/03/2017 11:58 AM

## 2017-05-03 NOTE — Plan of Care (Signed)
  Education: Knowledge of General Education information will improve 05/03/2017 0143 - Completed/Met by Evert Kohl, RN

## 2017-05-03 NOTE — Progress Notes (Signed)
  Echocardiogram 2D Echocardiogram has been performed.  Ann Riddle 05/03/2017, 3:41 PM

## 2017-05-13 DIAGNOSIS — K5909 Other constipation: Secondary | ICD-10-CM | POA: Diagnosis not present

## 2017-05-13 DIAGNOSIS — K449 Diaphragmatic hernia without obstruction or gangrene: Secondary | ICD-10-CM | POA: Diagnosis not present

## 2017-05-13 DIAGNOSIS — M5416 Radiculopathy, lumbar region: Secondary | ICD-10-CM | POA: Diagnosis not present

## 2017-05-13 DIAGNOSIS — K219 Gastro-esophageal reflux disease without esophagitis: Secondary | ICD-10-CM | POA: Diagnosis not present

## 2017-05-13 DIAGNOSIS — Z6825 Body mass index (BMI) 25.0-25.9, adult: Secondary | ICD-10-CM | POA: Diagnosis not present

## 2017-05-13 DIAGNOSIS — I10 Essential (primary) hypertension: Secondary | ICD-10-CM | POA: Diagnosis not present

## 2017-05-13 DIAGNOSIS — I739 Peripheral vascular disease, unspecified: Secondary | ICD-10-CM | POA: Diagnosis not present

## 2017-05-18 ENCOUNTER — Emergency Department (HOSPITAL_COMMUNITY): Payer: Medicare HMO

## 2017-05-18 ENCOUNTER — Emergency Department (HOSPITAL_COMMUNITY)
Admission: EM | Admit: 2017-05-18 | Discharge: 2017-05-18 | Disposition: A | Discharge status: Home / Self Care | Payer: Medicare HMO | Attending: Emergency Medicine | Admitting: Emergency Medicine

## 2017-05-18 ENCOUNTER — Encounter (HOSPITAL_COMMUNITY): Payer: Self-pay | Admitting: Emergency Medicine

## 2017-05-18 DIAGNOSIS — R531 Weakness: Secondary | ICD-10-CM | POA: Insufficient documentation

## 2017-05-18 DIAGNOSIS — R079 Chest pain, unspecified: Secondary | ICD-10-CM | POA: Diagnosis not present

## 2017-05-18 DIAGNOSIS — Z7982 Long term (current) use of aspirin: Secondary | ICD-10-CM | POA: Diagnosis not present

## 2017-05-18 DIAGNOSIS — M25551 Pain in right hip: Secondary | ICD-10-CM | POA: Diagnosis not present

## 2017-05-18 DIAGNOSIS — Z8719 Personal history of other diseases of the digestive system: Secondary | ICD-10-CM | POA: Diagnosis not present

## 2017-05-18 DIAGNOSIS — R0789 Other chest pain: Secondary | ICD-10-CM | POA: Diagnosis not present

## 2017-05-18 DIAGNOSIS — Z79899 Other long term (current) drug therapy: Secondary | ICD-10-CM | POA: Diagnosis not present

## 2017-05-18 DIAGNOSIS — M79604 Pain in right leg: Secondary | ICD-10-CM | POA: Insufficient documentation

## 2017-05-18 DIAGNOSIS — M25511 Pain in right shoulder: Secondary | ICD-10-CM | POA: Insufficient documentation

## 2017-05-18 DIAGNOSIS — I10 Essential (primary) hypertension: Secondary | ICD-10-CM | POA: Diagnosis not present

## 2017-05-18 LAB — CBC WITH DIFFERENTIAL/PLATELET
Basophils Absolute: 0 10*3/uL (ref 0.0–0.1)
Basophils Relative: 1 %
Eosinophils Absolute: 0.1 10*3/uL (ref 0.0–0.7)
Eosinophils Relative: 4 %
HEMATOCRIT: 36.3 % (ref 36.0–46.0)
Hemoglobin: 12.2 g/dL (ref 12.0–15.0)
LYMPHS ABS: 1.1 10*3/uL (ref 0.7–4.0)
Lymphocytes Relative: 38 %
MCH: 29.6 pg (ref 26.0–34.0)
MCHC: 33.6 g/dL (ref 30.0–36.0)
MCV: 88.1 fL (ref 78.0–100.0)
MONOS PCT: 7 %
Monocytes Absolute: 0.2 10*3/uL (ref 0.1–1.0)
NEUTROS ABS: 1.5 10*3/uL — AB (ref 1.7–7.7)
NEUTROS PCT: 50 %
Platelets: 208 10*3/uL (ref 150–400)
RBC: 4.12 MIL/uL (ref 3.87–5.11)
RDW: 13 % (ref 11.5–15.5)
WBC: 3 10*3/uL — ABNORMAL LOW (ref 4.0–10.5)

## 2017-05-18 LAB — BASIC METABOLIC PANEL
ANION GAP: 9 (ref 5–15)
BUN: 13 mg/dL (ref 6–20)
CHLORIDE: 99 mmol/L — AB (ref 101–111)
CO2: 27 mmol/L (ref 22–32)
Calcium: 9.2 mg/dL (ref 8.9–10.3)
Creatinine, Ser: 0.71 mg/dL (ref 0.44–1.00)
GFR calc non Af Amer: >60 mL/min (ref >60)
Glucose, Bld: 100 mg/dL — ABNORMAL HIGH (ref 65–99)
Potassium: 3.6 mmol/L (ref 3.5–5.1)
Sodium: 135 mmol/L (ref 135–145)

## 2017-05-18 LAB — I-STAT TROPONIN, ED: Troponin i, poc: 0.01 ng/mL (ref 0.00–0.08)

## 2017-05-18 MED ORDER — DICLOFENAC SODIUM 1 % TD GEL: 4.0000 g | Freq: Four times a day (QID) | TRANSDERMAL | 0 refills | Status: DC

## 2017-05-18 MED ORDER — SUCRALFATE 1 GM/10ML PO SUSP: 1.0000 g | Freq: Three times a day (TID) | ORAL | 0 refills | Status: DC

## 2017-05-18 MED ORDER — ACETAMINOPHEN 500 MG PO TABS
500.0000 mg | ORAL_TABLET | Freq: Once | ORAL | Status: AC
  Administered 2017-05-18: 500 mg via ORAL
  Filled 2017-05-18: qty 1

## 2017-05-18 MED ORDER — SUCRALFATE 1 G PO TABS
1.0000 g | ORAL_TABLET | Freq: Once | ORAL | Status: AC
  Administered 2017-05-18: 1 g via ORAL
  Filled 2017-05-18: qty 1

## 2017-05-18 NOTE — ED Notes (Signed)
Patient transported to X-ray 

## 2017-05-18 NOTE — ED Notes (Signed)
Bed: [facility] Expected date:  Expected time:  Means of arrival:  Comments: 

## 2017-05-18 NOTE — ED Provider Notes (Signed)
[facility] DEPT Provider Note   CSN: [id number] Arrival date & time: 05/18/17  0526     History   Chief Complaint Chief Complaint  Patient presents with  . Weakness  . right leg pain    HPI Ann Riddle is a 84 y.o. female with a history of hiatal hernia, GERD, hypertension, hyperlipidemia who presents emerged department today for chest pain and right leg pain. Patient lives at home without assistance.  Patient is not a very detailed historian.  She states that her last several weeks she has been having intermittent chest pain that she describes as a burning in the center of the chest that commonly occurs after eating.  She states she was seen for the same on 05/02/2017 and admitted to the hospital for this.  Chart review the patient was admitted with a heart score of 4.  She had a serial troponin x3 that were negative.  Echocardiogram that was done in the hospital shows ejection fraction of 75-80% without any wall motion abnormalities.  There was grade 1 diastolic dysfunction. She takes Nexium every day and Zantac at night.  She has had a CT scan of the abdomen and pelvis at this time that showed mild circumferential wall thickening in the antropyloric region of the stomach there is nonspecific.  Patient notes for the last pain occurred yesterday while at rest, lasting ~30 minutes.  This is nonexertional and non-positional. There is no associated diaphoresis, shortness of breath, nausea, emesis.  There is no radiation of her pain to her jaw, neck, either shoulders or back.  She denies any lower leg swelling.  She has not followed up for this. No active chest pain currently.   Patient also reports that one week ago she she was was getting out of bed when she felt a pulling sensation in her right lower back with radiation into her right hip.  She states that the pain is worse with adduction of the hip as well as when ambulating.  She normally ambulates without  assistance.  She has been trying heating pads for this with mild to moderate relief of her symptoms.  The patient denies any fall, trauma, leg swelling, numbness/tingling/weakness, bowel/bladder incontinence, urinary retention, urinary frequency, urinary urgency, hematuria, dysuria.  HPI  Past Medical History:  Diagnosis Date  . Diaphragmatic hernia without mention of obstruction or gangrene   . Diverticulosis of colon (without mention of hemorrhage)   . Esophageal reflux   . Irritable bowel syndrome   . Other and unspecified hyperlipidemia   . Unspecified essential hypertension   . Ventral hernia, unspecified, without mention of obstruction or gangrene     Patient Active Problem List   Diagnosis Date Noted  . Chest pain 11/13/2015  . Constipation 11/10/2007  . HYPERLIPIDEMIA 08/04/2007  . Essential hypertension 08/04/2007  . GERD 08/04/2007  . IBS 08/04/2007    Past Surgical History:  Procedure Laterality Date  . CHOLECYSTECTOMY    . HIATAL HERNIA REPAIR      OB History    No data available       Home Medications    Prior to Admission medications   Medication Sig Start Date End Date Taking? Authorizing Provider  acetaminophen (TYLENOL) 500 MG tablet Take 500 mg by mouth every 6 (six) hours as needed for headache (pain).  02/14/17   [provider]  aspirin EC 81 MG tablet Take 81 mg by mouth daily.    [provider]  clonazePAM Bobbye Charleston)  0.5 MG tablet Take 0.5 mg by mouth 3 (three) times daily as needed for anxiety (sleep).     [provider]  colesevelam (WELCHOL) 625 MG tablet Take 625 mg by mouth daily.     [provider]  docusate sodium (COLACE) 100 MG capsule Take 1 capsule (100 mg total) by mouth every 12 (twelve) hours. 08/18/15   Melony Overly, MD  ENSURE (ENSURE) Take 237 mLs by mouth daily.    [provider]  esomeprazole (NEXIUM) 40 MG capsule Take 1 capsule (40 mg total) by mouth daily at 12 noon. Patient  taking differently: Take 40 mg by mouth daily.  06/15/13   Milus Banister, MD  hydrocortisone (ANUSOL-HC) 2.5 % rectal cream Apply rectally 2 times daily Patient taking differently: Place 1 application rectally 2 (two) times daily as needed for hemorrhoids.  08/18/15   Melony Overly, MD  ipratropium (ATROVENT) 0.06 % nasal spray Place 2 sprays into both nostrils 4 (four) times daily. Patient taking differently: Place 2 sprays into both nostrils 4 (four) times daily as needed for rhinitis.  01/01/17   Lysbeth Penner, FNP  lubiprostone (AMITIZA) 24 MCG capsule Take 24 mcg by mouth 2 (two) times daily with a meal.    [provider]  Multiple Vitamin (MULTIVITAMIN WITH MINERALS) TABS tablet Take 1 tablet by mouth daily.    [provider]  nitroGLYCERIN (NITROSTAT) 0.4 MG SL tablet Place 1 tablet (0.4 mg total) under the tongue every 5 (five) minutes as needed for chest pain. 11/14/15   Thurnell Lose, MD  olmesartan-hydrochlorothiazide (BENICAR HCT) 40-12.5 MG per tablet Take 1 tablet by mouth See admin instructions. Take 1 tablet by mouth every Sunday, Monday, Wednesday, Friday morning    [provider]  ondansetron (ZOFRAN-ODT) 4 MG disintegrating tablet Take 4 mg by mouth every 6 (six) hours as needed for nausea or vomiting.  10/15/15   [provider]  ranitidine (ZANTAC) 150 MG tablet Take 1 tablet (150 mg total) by mouth 2 (two) times daily. Patient taking differently: Take 150 mg by mouth at bedtime.  01/06/17   Muthersbaugh, Jarrett Soho, PA-C  sucralfate (CARAFATE) 1 GM/10ML suspension Take 10 mLs (1 g total) by mouth 4 (four) times daily -  with meals and at bedtime. 05/03/17   Jani Gravel, MD    Family History Family History  Problem Relation Age of Onset  . Breast cancer Daughter 44  . Colon cancer Neg Hx     Social History Social History   Tobacco Use  . Smoking status: Never Smoker  . Smokeless tobacco: Never Used  Substance Use Topics  .  Alcohol use: No  . Drug use: No     Allergies   Bacid   Review of Systems Review of Systems  All other systems reviewed and are negative.    Physical Exam Updated Vital Signs BP (!) 149/66 (BP Location: Left Arm)   Pulse 71   Temp 98.3 F (36.8 C) (Oral)   Resp 20   SpO2 99%   Physical Exam  Constitutional: She appears well-developed and well-nourished. No distress.  Elderly female resting comfortably.  HENT:  Head: Normocephalic and atraumatic.  Right Ear: External ear normal.  Left Ear: External ear normal.  Nose: Nose normal.  Mouth/Throat: Uvula is midline, oropharynx is clear and moist and mucous membranes are normal. No tonsillar exudate.  Eyes: Pupils are equal, round, and reactive to light. Right eye exhibits no discharge. Left eye  exhibits no discharge. No scleral icterus.  Neck: Trachea normal and normal range of motion. Neck supple. No spinous process tenderness present. No neck rigidity. Normal range of motion present.  Cardiovascular: Normal rate, regular rhythm and intact distal pulses.  Pulses:      Radial pulses are 2+ on the right side, and 2+ on the left side.       Femoral pulses are 2+ on the right side, and 2+ on the left side.      Dorsalis pedis pulses are 2+ on the right side, and 2+ on the left side.       Posterior tibial pulses are 2+ on the right side, and 2+ on the left side.  No lower extremity swelling or edema. Calves symmetric in size bilaterally.  Pulmonary/Chest: Effort normal and breath sounds normal. No respiratory distress. She exhibits tenderness (central).  Abdominal: Soft. Bowel sounds are normal. She exhibits no pulsatile midline mass. There is no tenderness. There is no rigidity, no rebound, no guarding and no CVA tenderness.  Musculoskeletal: She exhibits no edema.       Right hip: She exhibits bony tenderness. She exhibits normal range of motion and normal strength.       Right knee: Normal.       Right ankle: Normal.    Posterior and appearance appears normal. No evidence of obvious scoliosis or kyphosis. No obvious signs of skin changes, trauma, deformity, infection. No C, T, or L spine tenderness or step-offs to palpation. No C, T paraspinal tenderness. Right sided lumbar, gluteal ttp. Lung expansion normal. Bilateral lower extremity strength 5 out of 5. Patellar and Achilles deep tendon reflex 2+ and equal bilaterally. Sensation of lower extremities grossly intact. Straight leg right neg. Straight leg left neg. Gait able but patient notes painful. Lower extremity compartments soft. PT and DP 2+ b/l. Cap refill <2 seconds.  Negative logroll test bilaterally.  No sacral crepitus.  Lymphadenopathy:    She has no cervical adenopathy.  Neurological: She is alert.  Mental Status:  Alert, oriented, thought content appropriate, able to give a coherent history. Speech fluent without evidence of aphasia. Able to follow 2 step commands without difficulty.  Cranial Nerves:  II:  Peripheral visual fields grossly normal, pupils equal, round, reactive to light III,IV, VI: ptosis not present, extra-ocular motions intact bilaterally  V,VII: smile symmetric, eyebrows raise symmetric, facial light touch sensation equal VIII: hearing grossly normal to voice  X: uvula elevates symmetrically  XI: bilateral shoulder shrug symmetric and strong XII: midline tongue extension without fassiculations Motor:  Normal tone. 5/5 in upper and lower extremities bilaterally including strong and equal grip strength and dorsiflexion/plantar flexion Sensory: Sensation intact to light touch in all extremities. Negative Romberg.  Deep Tendon Reflexes: 2+ and symmetric in the biceps and patella Cerebellar: normal finger-to-nose with bilateral upper extremities. Normal heel-to -shin balance bilaterally of the lower extremity. No pronator drift.  Gait: normal gait and balance CV: distal pulses palpable throughout   Skin: Skin is warm, dry and  intact. Capillary refill takes less than 2 seconds. No rash noted. She is not diaphoretic. No erythema.  Psychiatric: She has a normal mood and affect.  Nursing note and vitals reviewed.    ED Treatments / Results  Labs (all labs ordered are listed, but only abnormal results are displayed) Labs Reviewed - No data to display  EKG  EKG Interpretation  Date/Time:  Monday May 18 2017 05:48:21 EST Ventricular Rate:  74 PR Interval:  QRS Duration: 76 QT Interval:  407 QTC Calculation: 452 R Axis:   14 Text Interpretation:  Sinus rhythm Abnormal R-wave progression, early transition Electrode noise Baseline wander Since last tracing 02 May 2017 T wave inversion no longer evident in Inferior leads Confirmed by Rolland Porter [phone]) on 05/18/2017 6:15:39 AM       Radiology Dg Chest 2 View  Result Date: 05/18/2017 CLINICAL DATA:  Chest pain EXAM: CHEST  2 VIEW COMPARISON:  May 02, 2017 FINDINGS: There is no edema or consolidation. The heart size and pulmonary vascularity are normal. No adenopathy. There is aortic atherosclerosis. There is calcification in the mitral annulus. There is degenerative change in the thoracic spine and in each shoulder. No evident pneumothorax. IMPRESSION: No edema or consolidation.  There is aortic atherosclerosis. Aortic Atherosclerosis (ICD10-I70.0). Electronically Signed   By: Lowella Grip III M.D.   On: 05/18/2017 07:21   Dg Hip Unilat With Pelvis 2-3 Views Right  Result Date: 05/18/2017 CLINICAL DATA:  Pain EXAM: DG HIP (WITH OR WITHOUT PELVIS) 2-3V RIGHT COMPARISON:  None. FINDINGS: Frontal pelvis as well as frontal and lateral right hip images were obtained. There is no fracture or dislocation. The joint spaces appear normal. No erosive change. IMPRESSION: No fracture or dislocation.  No evident arthropathy. Electronically Signed   By: Lowella Grip III M.D.   On: 05/18/2017 07:22    Procedures Procedures (including critical care  time)  Medications Ordered in ED Medications  acetaminophen (TYLENOL) tablet 500 mg (500 mg Oral Given 05/18/17 0720)     Initial Impression / Assessment and Plan / ED Course  I have reviewed the triage vital signs and the nursing notes.  Pertinent labs & imaging results that were available during my care of the patient were reviewed by me and considered in my medical decision making (see chart for details).      84 y.o. female with a history of hiatal hernia, GERD, hypertension, hyperlipidemia who presents ED for chest pain and right leg pain.   She states that her last several weeks she has been having intermittent chest pain that she describes as a burning in the center of the chest that commonly occurs after eating.  She states she was seen for the same on 05/02/2017 and admitted to the hospital for this.  Chart review the patient was admitted with a heart score of 4.  She had a serial troponin x3 that were negative.  Echocardiogram that was done in the hospital shows ejection fraction of 75-80% without any wall motion abnormalities.  There was grade 1 diastolic dysfunction. She takes Nexium every day and Zantac at night.  She has had a CT scan of the abdomen and pelvis at this time that showed mild circumferential wall thickening in the antropyloric region of the stomach there is nonspecific.  Patient notes for the last pain occurred yesterday while at rest, after eating dinner, lasting ~30 minutes.  This is nonexertional and non-positional. There is no associated diaphoresis, shortness of breath, nausea, emesis.  There is no radiation of her pain to her jaw, neck, either shoulders or back.  She denies any lower leg swelling.  She has not followed up for this. No active chest pain currently.  Patient vital signs are reassuring on presentation.  Lungs are clear to auscultation bilaterally.  Heart is regular rate and rhythm.  Abdomen is soft, nontender without peritoneal signs.  She is tender on  central chest.  No crepitus on palpation.  EKG  without evidence of ischemia.  T wave inversion has resolved since prior EKG.  Troponin negative.  Chest x-ray reassuring without consolidation or edema.  Labs otherwise reassuring.  Suspect this is related to the patient's GERD versus gastritis and will discharge home on Carafate.  She is to continue her Nexium and Zantac.   Patient also reports that one week ago she she was was getting out of bed when she felt a pulling sensation in her right lower back with radiation into her right hip.  She states that the pain is worse with adduction of the hip as well as when ambulating.  She normally ambulates without assistance.  She has been trying heating pads for this with mild to moderate relief of her symptoms.  The patient denies any fall, trauma, leg swelling, numbness/tingling/weakness, bowel/bladder incontinence, urinary retention, urinary frequency, urinary urgency, hematuria, dysuria.  No concern at this time for UTI or cauda equina syndrome.  Patient with normal neurologic exam and no focal neurologic deficits.  Do not suspect pain is related to CVA.  No lower leg swelling or pain.  Do not suspect DVT at this time.  Patient is tender in the lower back and upper gluteus.  Patient has full range of motion without swelling or erythema.  She is afebrile.  Do not suspect septic joint.  X-rays of the right hip unremarkable.  Patient is able to ambulate in the department.  She is neurovascular intact.  Suspect this is likely muscular skeletal.  Will treat with heat, topical diclofenac and Tylenol. She was given tylenol in the department with improvement of her symptoms.   The evaluation does not show pathology that would require ongoing emergent intervention or inpatient treatment. I advised the patient to follow-up with PCP this week. I advised the patient to return to the emergency department with new or worsening symptoms or new concerns. Specific return precautions  discussed. The patient verbalized understanding and agreement with plan. All questions answered. No further questions at this time. The patient is hemodynamically stable, mentating appropriately and appears safe for discharge.  Patient case seen and discussed with Dr. Wilson Singer who is in agreement with plan.   Final Clinical Impressions(s) / ED Diagnoses   Final diagnoses:  Atypical chest pain  History of gastroesophageal reflux (GERD)  Right hip pain    ED Discharge Orders    None       Lorelle Gibbs 05/18/17 2947    Virgel Manifold, MD 05/18/17 [phone]

## 2017-05-18 NOTE — ED Triage Notes (Addendum)
Per EMS , pt. From home with complaint of weakness since 3am this morning, pt. Also reported of right leg pain  At 8/10 x 1 week now. Denied fall, denied fever, pt. Alert and oriented x2/3. Pt. Lives alone at home. Pt. reported that she was admitted at [facility]. 2 weeks ago for the same problem. Negative for s/s of stroke per EMS eval. Per EMS , pt. Ambulated towards their stretcher.

## 2017-05-18 NOTE — Discharge Instructions (Signed)
Please follow attached instructions.  For right hip use 650mg  of tylenol every 6 hours as needed for pain. Apply diclofenac gel as directed. Apply heat for additional relief. Make sure not to leave on to long to prevent burning of the skin.  Please follow attached handouts on gastritis. Pleas take carafate as prescribed. Follow up with PCP in the next 2-3 days. Return if pain worsens, is associated with nausea, shortness of breath or becomes exertional.

## 2017-05-18 NOTE — ED Notes (Signed)
Bed: ZX28 Expected date:  Expected time:  Means of arrival:  Comments: Ems-weakness

## 2017-06-08 DIAGNOSIS — E7849 Other hyperlipidemia: Secondary | ICD-10-CM | POA: Diagnosis not present

## 2017-06-08 DIAGNOSIS — I10 Essential (primary) hypertension: Secondary | ICD-10-CM | POA: Diagnosis not present

## 2017-06-08 DIAGNOSIS — R82998 Other abnormal findings in urine: Secondary | ICD-10-CM | POA: Diagnosis not present

## 2017-06-08 DIAGNOSIS — M810 Age-related osteoporosis without current pathological fracture: Secondary | ICD-10-CM | POA: Diagnosis not present

## 2017-06-08 DIAGNOSIS — R7309 Other abnormal glucose: Secondary | ICD-10-CM | POA: Diagnosis not present

## 2017-06-15 ENCOUNTER — Other Ambulatory Visit: Payer: Self-pay

## 2017-06-15 ENCOUNTER — Emergency Department (HOSPITAL_COMMUNITY): Payer: Medicare HMO

## 2017-06-15 ENCOUNTER — Emergency Department (HOSPITAL_COMMUNITY)
Admission: EM | Admit: 2017-06-15 | Discharge: 2017-06-16 | Disposition: A | Discharge status: Home / Self Care | Payer: Medicare HMO | Attending: Emergency Medicine | Admitting: Emergency Medicine

## 2017-06-15 ENCOUNTER — Encounter (HOSPITAL_COMMUNITY): Payer: Self-pay

## 2017-06-15 DIAGNOSIS — R0602 Shortness of breath: Secondary | ICD-10-CM | POA: Diagnosis not present

## 2017-06-15 DIAGNOSIS — R070 Pain in throat: Secondary | ICD-10-CM | POA: Insufficient documentation

## 2017-06-15 DIAGNOSIS — Z7982 Long term (current) use of aspirin: Secondary | ICD-10-CM | POA: Diagnosis not present

## 2017-06-15 DIAGNOSIS — I10 Essential (primary) hypertension: Secondary | ICD-10-CM | POA: Insufficient documentation

## 2017-06-15 DIAGNOSIS — R079 Chest pain, unspecified: Secondary | ICD-10-CM | POA: Insufficient documentation

## 2017-06-15 DIAGNOSIS — R109 Unspecified abdominal pain: Secondary | ICD-10-CM | POA: Diagnosis not present

## 2017-06-15 DIAGNOSIS — Z79899 Other long term (current) drug therapy: Secondary | ICD-10-CM | POA: Insufficient documentation

## 2017-06-15 DIAGNOSIS — R1013 Epigastric pain: Secondary | ICD-10-CM | POA: Diagnosis not present

## 2017-06-15 DIAGNOSIS — K5900 Constipation, unspecified: Secondary | ICD-10-CM | POA: Insufficient documentation

## 2017-06-15 DIAGNOSIS — R110 Nausea: Secondary | ICD-10-CM | POA: Insufficient documentation

## 2017-06-15 DIAGNOSIS — K921 Melena: Secondary | ICD-10-CM | POA: Diagnosis not present

## 2017-06-15 LAB — COMPREHENSIVE METABOLIC PANEL
ALT: 13 U/L — AB (ref 14–54)
AST: 22 U/L (ref 15–41)
Albumin: 3.9 g/dL (ref 3.5–5.0)
Alkaline Phosphatase: 58 U/L (ref 38–126)
Anion gap: 11 (ref 5–15)
BILIRUBIN TOTAL: 0.6 mg/dL (ref 0.3–1.2)
BUN: 8 mg/dL (ref 6–20)
CO2: 24 mmol/L (ref 22–32)
CREATININE: 0.87 mg/dL (ref 0.44–1.00)
Calcium: 8.9 mg/dL (ref 8.9–10.3)
Chloride: 97 mmol/L — ABNORMAL LOW (ref 101–111)
GFR calc Af Amer: >60 mL/min (ref >60)
GFR, EST NON AFRICAN AMERICAN: 56 mL/min — AB (ref >60)
Glucose, Bld: 120 mg/dL — ABNORMAL HIGH (ref 65–99)
POTASSIUM: 3.8 mmol/L (ref 3.5–5.1)
Sodium: 132 mmol/L — ABNORMAL LOW (ref 135–145)
TOTAL PROTEIN: 6.6 g/dL (ref 6.5–8.1)

## 2017-06-15 LAB — CBC
HCT: 34.8 % — ABNORMAL LOW (ref 36.0–46.0)
Hemoglobin: 11.7 g/dL — ABNORMAL LOW (ref 12.0–15.0)
MCH: 29.8 pg (ref 26.0–34.0)
MCHC: 33.6 g/dL (ref 30.0–36.0)
MCV: 88.5 fL (ref 78.0–100.0)
PLATELETS: 237 10*3/uL (ref 150–400)
RBC: 3.93 MIL/uL (ref 3.87–5.11)
RDW: 13.1 % (ref 11.5–15.5)
WBC: 2.9 10*3/uL — AB (ref 4.0–10.5)

## 2017-06-15 LAB — CBG MONITORING, ED: GLUCOSE-CAPILLARY: 100 mg/dL — AB (ref 65–99)

## 2017-06-15 LAB — POC OCCULT BLOOD, ED: Fecal Occult Bld: POSITIVE — AB

## 2017-06-15 LAB — URINALYSIS, ROUTINE W REFLEX MICROSCOPIC
BILIRUBIN URINE: NEGATIVE
GLUCOSE, UA: NEGATIVE mg/dL
KETONES UR: NEGATIVE mg/dL
LEUKOCYTES UA: NEGATIVE
NITRITE: NEGATIVE
PH: 7 (ref 5.0–8.0)
Protein, ur: NEGATIVE mg/dL
Specific Gravity, Urine: 1.005 (ref 1.005–1.030)

## 2017-06-15 LAB — I-STAT TROPONIN, ED
Troponin i, poc: 0.01 ng/mL (ref 0.00–0.08)
Troponin i, poc: 0.01 ng/mL (ref 0.00–0.08)

## 2017-06-15 LAB — LIPASE, BLOOD: Lipase: 19 U/L (ref 11–51)

## 2017-06-15 MED ORDER — IOPAMIDOL (ISOVUE-300) INJECTION 61%
INTRAVENOUS | Status: AC
  Administered 2017-06-15: 100 mL
  Filled 2017-06-15: qty 100

## 2017-06-15 MED ORDER — ACETAMINOPHEN 325 MG PO TABS
650.0000 mg | ORAL_TABLET | Freq: Once | ORAL | Status: AC
  Administered 2017-06-15: 650 mg via ORAL
  Filled 2017-06-15: qty 2

## 2017-06-15 MED ORDER — FLEET ENEMA 7-19 GM/118ML RE ENEM: 1.0000 | ENEMA | Freq: Every day | RECTAL | 0 refills | Status: DC | PRN

## 2017-06-15 MED ORDER — MAGNESIUM CITRATE PO SOLN: 1.0000 | Freq: Once | ORAL | 0 refills | Status: AC

## 2017-06-15 NOTE — ED Triage Notes (Signed)
Per Pt, Pt is coming from home with constipation, abdominal pain, and chest pain that started last night. Pt reports her last bowel movement being four days ago.

## 2017-06-15 NOTE — ED Provider Notes (Addendum)
[facility] Provider Note   CSN: [id number] Arrival date & time: 06/15/17  1331     History   Chief Complaint Chief Complaint  Patient presents with  . Constipation  . Chest Pain    HPI Ann Riddle is a 84 y.o. female.  HPI   Pt is a 84 y/o F with a h/o diaphragmatic hernia, GERD, IBS, ventral hernia, and HTN, who presents to the ED today c/o abd pain that is chronic, but began to worsen last night. Pain is located in epigastric region and pelvic area. She also reports nausea, but no vomiting. States she has not had a BM in 4 days. Denies diarrhea. Has a h/o chronic constipation. States she took miralax one day ago with no relief.  Pt states she has burning pain to the chest. States the pain is midsternal and feels like her normal, chronic heartburn pain. States it is constant and has improved since onset last night. Rates pain 10/10 last night, it is an 8/10 today. States she took her acid reflux medicine which improved her symptoms.  Pt denies any cough, congestion, rhinorrhea or fevers. She denies swelling in her legs or shortness of breath. Denies urinary or vaginal sxs.  She also reports a sore throat for 2 weeks.  Past Medical History:  Diagnosis Date  . Diaphragmatic hernia without mention of obstruction or gangrene   . Diverticulosis of colon (without mention of hemorrhage)   . Esophageal reflux   . Irritable bowel syndrome   . Other and unspecified hyperlipidemia   . Unspecified essential hypertension   . Ventral hernia, unspecified, without mention of obstruction or gangrene     Patient Active Problem List   Diagnosis Date Noted  . Chest pain 11/13/2015  . Constipation 11/10/2007  . HYPERLIPIDEMIA 08/04/2007  . Essential hypertension 08/04/2007  . GERD 08/04/2007  . IBS 08/04/2007    Past Surgical History:  Procedure Laterality Date  . CHOLECYSTECTOMY    . HIATAL HERNIA REPAIR      OB History    No data  available       Home Medications    Prior to Admission medications   Medication Sig Start Date End Date Taking? Authorizing Provider  acetaminophen (TYLENOL) 500 MG tablet Take 500 mg by mouth every 6 (six) hours as needed for headache (pain).  02/14/17  Yes [provider]  alendronate (FOSAMAX) 70 MG tablet Take 70 mg by mouth every Monday. 05/20/17  Yes [provider]  aspirin EC 81 MG tablet Take 81 mg by mouth daily.   Yes [provider]  clonazePAM (KLONOPIN) 0.5 MG tablet Take 0.5 mg by mouth at bedtime as needed for anxiety (sleep).    Yes [provider]  colesevelam (WELCHOL) 625 MG tablet Take 625 mg by mouth daily.    Yes [provider]  docusate sodium (COLACE) 100 MG capsule Take 1 capsule (100 mg total) by mouth every 12 (twelve) hours. 08/18/15  Yes Melony Overly, MD  ENSURE (ENSURE) Take 237 mLs by mouth daily.   Yes [provider]  esomeprazole (NEXIUM) 40 MG capsule Take 1 capsule (40 mg total) by mouth daily at 12 noon. Patient taking differently: Take 40 mg by mouth 2 (two) times daily before a meal.  06/15/13  Yes Milus Banister, MD  hydrocortisone (ANUSOL-HC) 2.5 % rectal cream Apply rectally 2 times daily Patient taking differently: Place 1 application rectally 2 (two) times daily as  needed for hemorrhoids.  08/18/15  Yes Melony Overly, MD  ipratropium (ATROVENT) 0.06 % nasal spray Place 2 sprays into both nostrils 4 (four) times daily. Patient taking differently: Place 2 sprays into both nostrils 4 (four) times daily as needed for rhinitis.  01/01/17  Yes Lysbeth Penner, FNP  lubiprostone (AMITIZA) 24 MCG capsule Take 24 mcg by mouth 2 (two) times daily with a meal.   Yes [provider]  Multiple Vitamin (MULTIVITAMIN WITH MINERALS) TABS tablet Take 1 tablet by mouth daily.   Yes [provider]  olmesartan (BENICAR) 40 MG tablet Take 40 mg by mouth every other day. Every Tuesday, Thursday,  Saturday morning 04/27/17  Yes [provider]  olmesartan-hydrochlorothiazide (BENICAR HCT) 40-12.5 MG per tablet Take 1 tablet by mouth every other day. every Sunday, Monday, Wednesday, Friday morning   Yes [provider]  ondansetron (ZOFRAN-ODT) 4 MG disintegrating tablet Take 4 mg by mouth every 6 (six) hours as needed for nausea or vomiting.  10/15/15  Yes [provider]  ranitidine (ZANTAC) 150 MG tablet Take 1 tablet (150 mg total) by mouth 2 (two) times daily. Patient taking differently: Take 150 mg by mouth at bedtime.  01/06/17  Yes Muthersbaugh, Jarrett Soho, PA-C  diclofenac sodium (VOLTAREN) 1 % GEL Apply 4 g topically 4 (four) times daily. Patient not taking: Reported on 06/15/2017 05/18/17   Maczis, Barth Kirks, PA-C  nitroGLYCERIN (NITROSTAT) 0.4 MG SL tablet Place 1 tablet (0.4 mg total) under the tongue every 5 (five) minutes as needed for chest pain. Patient not taking: Reported on 05/18/2017 11/14/15   Thurnell Lose, MD  sodium phosphate (FLEET) 7-19 GM/118ML ENEM Place 133 mLs (1 enema total) rectally daily as needed for severe constipation. Maximum 3 consecutive days 06/15/17   Nickalaus Crooke S, PA-C  sucralfate (CARAFATE) 1 GM/10ML suspension Take 10 mLs (1 g total) by mouth 4 (four) times daily -  with meals and at bedtime. Patient not taking: Reported on 06/15/2017 05/18/17   Maczis, Barth Kirks, PA-C    Family History Family History  Problem Relation Age of Onset  . Breast cancer Daughter 74  . Colon cancer Neg Hx     Social History Social History   Tobacco Use  . Smoking status: Never Smoker  . Smokeless tobacco: Never Used  Substance Use Topics  . Alcohol use: No  . Drug use: No     Allergies   Patient has no known allergies.   Review of Systems Review of Systems  Constitutional: Negative for fever.  HENT: Positive for sore throat. Negative for congestion, ear pain, rhinorrhea and sinus pain.   Eyes: Negative for visual disturbance.    Respiratory: Negative for cough, shortness of breath and wheezing.   Cardiovascular: Positive for chest pain (burning). Negative for palpitations and leg swelling.  Gastrointestinal: Positive for abdominal pain and constipation. Negative for diarrhea, nausea and vomiting.  Genitourinary: Negative for flank pain, frequency, hematuria and urgency.  Musculoskeletal: Negative for back pain and neck pain.  Neurological: Negative for dizziness, weakness and headaches.     Physical Exam Updated Vital Signs BP 140/76   Pulse 84   Temp 98.1 F (36.7 C) (Oral)   Resp 12   Ht 5' (1.524 m)   Wt 61.2 kg (135 lb)   SpO2 99%   BMI 26.37 kg/m   Physical Exam  Constitutional: She is oriented to person, place, and time. She appears well-developed and well-nourished. No distress.  HENT:  Head: Normocephalic and atraumatic.  Dry mucous membranes, no pharyngeal erythema or exudates, no tonsillar swelling or exudates  Eyes: Conjunctivae are normal. Pupils are equal, round, and reactive to light.  Neck: Normal range of motion. Neck supple.  Cardiovascular: Normal rate, regular rhythm and intact distal pulses.  Pulmonary/Chest: Effort normal and breath sounds normal. No accessory muscle usage or stridor. No tachypnea. No respiratory distress. She has no decreased breath sounds. She has no wheezes. She has no rhonchi. She has no rales.  Abdominal: Soft. Bowel sounds are normal. She exhibits no distension and no mass. There is tenderness (epigastric and suprapubic). There is guarding. There is no rebound.  Genitourinary:  Genitourinary Comments: Rectal exam performed with chaperone present. No blood noted externally as well as no external hemorrhoids. No masses noted to rectum. Soft light brown stool noted in rectal vault. No evidence suggestive hard stool or significant impaction. No gross blood in stool. No pain with rectal exam  Musculoskeletal: She exhibits no edema.  No calf TTP, erythema, swelling.   Neurological: She is alert and oriented to person, place, and time. No cranial nerve deficit.  Normal strength and sensation to BLE  Skin: Skin is warm and dry. Capillary refill takes less than 2 seconds.  Psychiatric: She has a normal mood and affect.  Nursing note and vitals reviewed.    ED Treatments / Results  Labs (all labs ordered are listed, but only abnormal results are displayed) Labs Reviewed  COMPREHENSIVE METABOLIC PANEL - Abnormal; Notable for the following components:      Result Value   Sodium 132 (*)    Chloride 97 (*)    Glucose, Bld 120 (*)    ALT 13 (*)    GFR calc non Af Amer 56 (*)    All other components within normal limits  CBC - Abnormal; Notable for the following components:   WBC 2.9 (*)    Hemoglobin 11.7 (*)    HCT 34.8 (*)    All other components within normal limits  URINALYSIS, ROUTINE W REFLEX MICROSCOPIC - Abnormal; Notable for the following components:   Color, Urine STRAW (*)    Hgb urine dipstick MODERATE (*)    Bacteria, UA RARE (*)    Squamous Epithelial / LPF 0-5 (*)    All other components within normal limits  POC OCCULT BLOOD, ED - Abnormal; Notable for the following components:   Fecal Occult Bld POSITIVE (*)    All other components within normal limits  CBG MONITORING, ED - Abnormal; Notable for the following components:   Glucose-Capillary 100 (*)    All other components within normal limits  URINE CULTURE  LIPASE, BLOOD  I-STAT TROPONIN, ED  I-STAT TROPONIN, ED    EKG  EKG Interpretation  Date/Time:  Monday June 15 2017 13:56:15 EST Ventricular Rate:  81 PR Interval:  144 QRS Duration: 64 QT Interval:  380 QTC Calculation: 441 R Axis:   11 Text Interpretation:  Normal sinus rhythm with sinus arrhythmia Normal ECG Confirmed by Pattricia Boss [phone]) on 06/15/2017 7:46:42 PM       Radiology Dg Chest 2 View  Result Date: 06/15/2017 CLINICAL DATA:  Shortness of breath and chest pain EXAM: CHEST  2 VIEW  COMPARISON:  05/18/2017 FINDINGS: Cardiac shadow is stable. Aortic atherosclerotic changes are again noted. The lungs are well aerated bilaterally. No focal infiltrate or sizable effusion is seen. No bony abnormality is noted. IMPRESSION: No acute abnormality seen. Electronically Signed   By: Elta Guadeloupe  Lukens M.D.   On: 06/15/2017 14:57   Ct Abdomen Pelvis W Contrast  Result Date: 06/15/2017 CLINICAL DATA:  Acute generalized abdominal pain. EXAM: CT ABDOMEN AND PELVIS WITH CONTRAST TECHNIQUE: Multidetector CT imaging of the abdomen and pelvis was performed using the standard protocol following bolus administration of intravenous contrast. CONTRAST:  156mL ISOVUE-300 IOPAMIDOL (ISOVUE-300) INJECTION 61% COMPARISON:  CT scan of May 03, 2017. FINDINGS: Lower chest: No acute abnormality. Hepatobiliary: Status post cholecystectomy. No focal abnormality is noted in the liver. Stable intrahepatic and extrahepatic biliary dilatation is noted which most likely is due to post cholecystectomy status. Pancreas: Unremarkable. No pancreatic ductal dilatation or surrounding inflammatory changes. Spleen: Normal in size without focal abnormality. Adrenals/Urinary Tract: Adrenal glands appear normal. Stable left renal cysts are noted. No hydronephrosis or renal obstruction is noted. Mild urinary bladder distention is noted. No renal or ureteral calculi are noted. Stomach/Bowel: The stomach and appendix appear normal. Stable large duodenal diverticulum is noted. There is no evidence of bowel inflammation. Large amount of stool is noted in the rectum concerning for impaction. Vascular/Lymphatic: Aortic atherosclerosis. No enlarged abdominal or pelvic lymph nodes. Reproductive: Uterus and bilateral adnexa are unremarkable. Enlarged left pelvic varices are noted with enlarged left ovarian vein. Other: Small fat containing supraumbilical ventral hernia is noted. No abnormal fluid collection is noted. Musculoskeletal: No acute or  significant osseous findings. IMPRESSION: Large amount of stool is noted in rectum concerning for impaction. Small fat containing supraumbilical ventral hernia. Aortic atherosclerosis. Mild urinary bladder distention is noted. 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 Electronically Signed   By: Marijo Conception, M.D.   On: 06/15/2017 20:30    Procedures Procedures (including critical care time)  Medications Ordered in ED Medications  acetaminophen (TYLENOL) tablet 650 mg (650 mg Oral Given 06/15/17 1924)  iopamidol (ISOVUE-300) 61 % injection (100 mLs  Contrast Given 06/15/17 1959)     Initial Impression / Assessment and Plan / ED Course  I have reviewed the triage vital signs and the nursing notes.  Pertinent labs & imaging results that were available during my care of the patient were reviewed by me and considered in my medical decision making (see chart for details).   Discussed pt presentation and exam findings with Dr Jeanell Sparrow who personally evaluated the patient, reviewed the results of the workup and advises that I discharge the patient with rx for mag citrate and fleets enema. Agrees with plan to have pt f/u with GI as outpatient regarding results of hemoccult and further treatment of chronic constipation.  Rechecked pt. Repeat abdominal exam reveals nonsurgical abd. Belly is soft with active bowel sounds. Still does have mild ttp, but no rebound or peritoneal signs. NAD. Rectal exam performed  Final Clinical Impressions(s) / ED Diagnoses   Final diagnoses:  Constipation, unspecified constipation type  Abdominal pain, unspecified abdominal location  Blood in stool   84 y/o f presenting with abdominal pain, constipation, and burning chest pain.   VSS, NAD, and nontoxic/nonseptic appearing. Fluid bolus given. Abdominal exam with epigastric and suprapubic TTP, but active bowel sounds and not concerning for surgical  abdomen. no peritoneal signs noted. Rectal exam performed and light brown stool noted in rectal vault. Attempted disempaction. Stool was soft and did not appear to be impacted on my exam. Bedside hemoccult positive. hgb stable today and at baseline at 11.7. Advised gi f/u for further evaluation of this. Remainder of cbc reassuring with low WBC  count to 2.9 but baseline. CMP with mildly low sodium to 132 and low cl to 97. Liver enzymes grossly normal. Lipase negative. UA with hematuria, but no evidence of infection. Culture pending. Ct abdomen showed Large amount of stool is noted in rectum concerning for impaction. Small fat containing supraumbilical ventral hernia. Aortic atherosclerosis. Mild urinary bladder distention is noted. 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 Will plan to tx constipation with mag citrate and fleets enema as outpatient. Pt will need to f/u with PCP about pelvic congestion syndrome. Pt urinated after exam and had no evidence of urinary retention. No sbo, no perforation, nonsurgical abdomen.   Additionally, Burning chest pain more likely related to acid reflux than to underlying cardiac or pulmonary issue. Pt has a h/o chronic GERD s/p hiatal hernia repair. Sore throat also more likely related to this than to an underlying uri given that she has no other uri sxs. Trop negative x2, ecg with NSR and no st elevations or depression suggestive of ischemia or infarct. CXR negative for acute cardiopulmonary abnormality. Review of prior notes shows that pt has been seen for these types of symptoms multiple times in the ED and has had multiple negative cardiac workups. She was most recently seen for this about 1 month ago on 05/18/17 and had a negative workup. sxs were felt to be due to gerd. This is likely also GERD today. Discussed importance of taking current regularly scheduled home GERD medications with pt and caregiver and  advised pt to sit up for at least one hour after eating meals to help with symptoms.  Advised close f/u with pcp and with gi as an outpatient to f/u on constipation, blood in stool, and acid reflux. Advised pt an caregiver that she needs to be seen by a healthcare provider within 2 days and that she should return to the ED if she does not have a bowel movement in the next 24-48 hours. Gave strict return precautions to return to the ED for any fevers, abdominal distension, worsening abdominal pain, vomiting, persistent constipation, chest pain, shortness of breath, or any new or worsening symptoms. pt and her caretaker vocalized understanding of plan for discharge with medications, plan for f/u and reasons to return to the Ed. All questions answered. Caretaker states that she will also inform patients daughter of discharge instructions, plan for f/u, and return precautions. They feel comfortable with plan for d/c.  ED Discharge Orders        Ordered    magnesium citrate SOLN   Once     06/15/17 2211    sodium phosphate (FLEET) 7-19 GM/118ML ENEM  Daily PRN     06/15/17 2211       Rodney Booze, PA-C 06/16/17 0407    Rodney Booze, PA-C 06/16/17 0408    Pattricia Boss, MD 06/16/17 1538    Yoshi Mancillas S, PA-C 06/16/17 1928    Pattricia Boss, MD 06/17/17 0000

## 2017-06-15 NOTE — Discharge Instructions (Signed)
The patient will need to maintain adequate hydration over the next several days. You will need to give the patient 1 bottle of magnesium citrate by mouth for 1 dose.  You should also give a fleets enema rectally daily as needed for severe constipation.  You may repeat this dose again for up to 3 days, but no longer than 3 days.  I would like the patient to follow-up with her primary care doctor within 2-3 days if possible.  I have also given information for the patient to follow-up with a gastroenterologist for her chronic constipation as well as to follow-up about the blood that was noted in her stool today.  The patient should return to the emergency department if she experiences any fevers, abdominal distention, persistent vomiting, worsening abdominal pain, inability to have a bowel movement within the next 24-48 hours, increased blood in stool, lightheadedness, or any new or worsening symptoms.

## 2017-06-17 LAB — URINE CULTURE

## 2017-06-18 ENCOUNTER — Telehealth: Payer: Self-pay | Admitting: Gastroenterology

## 2017-06-18 LAB — HM DEXA SCAN

## 2017-06-18 MED ORDER — SUCRALFATE 1 GM/10ML PO SUSP: 1.0000 g | Freq: Three times a day (TID) | ORAL | 0 refills | Status: DC

## 2017-06-18 MED ORDER — ONDANSETRON 4 MG PO TBDP: 4.0000 mg | ORAL_TABLET | Freq: Four times a day (QID) | ORAL | 1 refills | Status: DC | PRN

## 2017-06-18 NOTE — Telephone Encounter (Signed)
Dr Ardis Hughs recommends that you complete a bowel purge (to clean out your bowels).   Please do the following:  Purchase a bottle of Miralax over the counter as well as a box of 5 mg dulcolax tablets. Take 4 dulcolax tablets. Wait 1 hour. You will then drink 6-8 capfuls of Miralax mixed in an adequate amount of water/juice/gatorade (you may choose which of these liquids to drink) over the next 2-3 hours. You should expect results within 1 to 6 hours after completing the bowel purge.  The pt has been advised and will keep appt for 2/26.  She asked for a refill of carafate and zofran  She will call back as needed

## 2017-06-18 NOTE — Telephone Encounter (Signed)
That appt is soon enough. Thank you  Advise miralax/gatorade purge for now.

## 2017-06-18 NOTE — Telephone Encounter (Signed)
Dr Ardis Hughs, see below.  The pt has an appt for 06/30/17 with you is that soon enough or should she have app appt.    Advised close f/u with pcp and with gi as an outpatient to f/u on constipation, blood in stool, and acid reflux. Advised pt an caregiver that she needs to be seen by a healthcare provider within 2 days and that she should return to the ED if she does not have a bowel movement in the next 24-48 hours. Gave strict return precautions to return to the ED for any fevers, abdominal distension, worsening abdominal pain, vomiting, persistent constipation, chest pain, shortness of breath, or any new or worsening symptoms. pt and her caretaker vocalized understanding of plan for discharge with medications, plan for f/u and reasons to return to the Ed. All questions answered. Caretaker states that she will also inform patients daughter of discharge instructions, plan for f/u, and return precautions. They feel comfortable with plan for d/c.       ED Discharge Orders

## 2017-06-22 ENCOUNTER — Telehealth: Payer: Self-pay | Admitting: Gastroenterology

## 2017-06-22 NOTE — Telephone Encounter (Signed)
The pt has been advised to take 1 capful of miralax everyday for constipation.  The pt agreed and verbalized understanding

## 2017-06-30 ENCOUNTER — Other Ambulatory Visit (INDEPENDENT_AMBULATORY_CARE_PROVIDER_SITE_OTHER): Payer: Medicare HMO

## 2017-06-30 ENCOUNTER — Encounter: Payer: Self-pay | Admitting: Physician Assistant

## 2017-06-30 ENCOUNTER — Ambulatory Visit: Payer: Medicare HMO | Admitting: Physician Assistant

## 2017-06-30 VITALS — BP 110/56 | HR 89 | Ht 59.5 in | Wt 128.0 lb

## 2017-06-30 DIAGNOSIS — R195 Other fecal abnormalities: Secondary | ICD-10-CM | POA: Diagnosis not present

## 2017-06-30 DIAGNOSIS — K219 Gastro-esophageal reflux disease without esophagitis: Secondary | ICD-10-CM

## 2017-06-30 DIAGNOSIS — K5909 Other constipation: Secondary | ICD-10-CM | POA: Diagnosis not present

## 2017-06-30 LAB — CBC WITH DIFFERENTIAL/PLATELET
Basophils Absolute: 0 10*3/uL (ref 0.0–0.1)
Basophils Relative: 0.9 % (ref 0.0–3.0)
EOS PCT: 1 % (ref 0.0–5.0)
Eosinophils Absolute: 0 10*3/uL (ref 0.0–0.7)
HEMATOCRIT: 35.5 % — AB (ref 36.0–46.0)
HEMOGLOBIN: 12.2 g/dL (ref 12.0–15.0)
LYMPHS PCT: 23.5 % (ref 12.0–46.0)
Lymphs Abs: 0.9 10*3/uL (ref 0.7–4.0)
MCHC: 34.4 g/dL (ref 30.0–36.0)
MCV: 88.4 fl (ref 78.0–100.0)
MONOS PCT: 9.4 % (ref 3.0–12.0)
Monocytes Absolute: 0.4 10*3/uL (ref 0.1–1.0)
Neutro Abs: 2.5 10*3/uL (ref 1.4–7.7)
Neutrophils Relative %: 65.2 % (ref 43.0–77.0)
Platelets: 213 10*3/uL (ref 150.0–400.0)
RBC: 4.02 Mil/uL (ref 3.87–5.11)
RDW: 13.8 % (ref 11.5–15.5)
WBC: 3.8 10*3/uL — AB (ref 4.0–10.5)

## 2017-06-30 NOTE — Progress Notes (Signed)
Chief Complaint: Abdominal Pain and constipation  Review of gastrointestinal problems:  1. Chronic abdominal pain. Diagnosed as adhesive disease in the past by Dr. Lyla Son. CT scan September 2008 showed ventral hiatal hernia was stable. No acute findings in the abdomen or pelvis. January, 2010: repeat pains, went to emergency room, complete metabolic profile, amylase, lipase were normal. CT Scan January, 2010 with IV and oral contrast of abdomen and pelvis was normal. CT Scan December, 2010 done by ER, essentially normal. EGD January 2011: Mild gastritis, duodenitis. Biopsies showed no H. pylori. 2013 imaging: July [place] no clear cause of pain; July CT scan same; November CT scan same.  2. Chronic gastroesophageal reflux disease. Esophagogastroduodenoscopy July 2002 showed no Barrett's esophagus. EGD October 2008 was normal, except for a 2 cm hiatal hernia.  3. History of colon polyps,colonoscopy October 2004 by Dr. Lyla Son showed no colon polyps. He wrote the indication for that examination was adenomatous polyps. I do not see any pathology reports in our chart indicating adenomatous polyps. Repeat colonoscopy 01/2008 by [facility] was normal to terminal ileum except for small hemorrhoids.  4. Status post remote cholecystectomy  5. Nausea: Winter, 2010; EGD January 2011, see above   HPI:    Ann Riddle is a 84 year old African-American female with a past medical history as listed below, who follows with Dr. Ardis Hughs and presents clinic today for follow-up after recently being in the ED for abdominal pain and constipation.    ED 06/15/17 presented with abdominal pain that is chronic but began to worsen the night before, described as epigastric and pelvic with nausea and no vomiting.  Constipation times 4 days.  Also burning in her chest.  CMP with a low sodium at 132, ALT 13, chloride 97 and otherwise normal.  CBC with a white count of 2.9 entheses baseline), hemoglobin 11.7.  Fecal occult positive,  though brown stool per report.  CT abdomen pelvis with contrast shows large amount of stool in the rectum concerning for impaction, small fat-containing supra umbilical ventral hernia, aortic atherosclerosis, mild urinary bladder distention and enlarged left pelvic varices with enlarged left ovarian vein suggestive of congestion syndrome.  Discharged with mag citrate and fleets enema.    Last [facility] 01/11/08 with small hemorrhoids and otherwise normal.    Today, presents with daughter who helps with his history. Constipated for 4 days with lower abdominal pain and she went to the ER, since then she has been using MiraLAX once daily as well as her Amitiza 24 mcg twice daily and has been having daily bowel movements.  Denies any further abdominal pain.  Not using her Colace.  Occasionally she uses an enema.    Also describes some hoarseness today and her episode of epigastric pain in the ER, but this is better now.  Using her Nexium 40 mg once a day, Zantac 150 twice a day and Carafate 3 times a day, before meals.  She is not taking her nighttime dose of Carafate but "I do not know why".    Describes the hemoccult positive stools in the ER but tells me she was not seeing any blood in them unless "I was really constipated when I wiped".    Denies fever, chills, continued abdominal pain, nausea, vomiting or dysphagia.  Past Medical History:  Diagnosis Date  . Diaphragmatic hernia without mention of obstruction or gangrene   . Diverticulosis of colon (without mention of hemorrhage)   . Esophageal reflux   . Irritable bowel syndrome   .  Other and unspecified hyperlipidemia   . Unspecified essential hypertension   . Ventral hernia, unspecified, without mention of obstruction or gangrene     Past Surgical History:  Procedure Laterality Date  . CHOLECYSTECTOMY    . HIATAL HERNIA REPAIR      Current Outpatient Medications  Medication Sig Dispense Refill  . acetaminophen (TYLENOL) 500 MG tablet Take 500  mg by mouth every 6 (six) hours as needed for headache (pain).     Marland Kitchen alendronate (FOSAMAX) 70 MG tablet Take 70 mg by mouth every Monday.  3  . aspirin EC 81 MG tablet Take 81 mg by mouth daily.    . clonazePAM (KLONOPIN) 0.5 MG tablet Take 0.5 mg by mouth at bedtime as needed for anxiety (sleep).     . colesevelam (WELCHOL) 625 MG tablet Take 625 mg by mouth daily.     . diclofenac sodium (VOLTAREN) 1 % GEL Apply 4 g topically 4 (four) times daily. (Patient not taking: Reported on 06/15/2017) 100 g 0  . docusate sodium (COLACE) 100 MG capsule Take 1 capsule (100 mg total) by mouth every 12 (twelve) hours. 60 capsule 0  . ENSURE (ENSURE) Take 237 mLs by mouth daily.    Marland Kitchen esomeprazole (NEXIUM) 40 MG capsule Take 1 capsule (40 mg total) by mouth daily at 12 noon. (Patient taking differently: Take 40 mg by mouth 2 (two) times daily before a meal. ) 30 capsule 11  . hydrocortisone (ANUSOL-HC) 2.5 % rectal cream Apply rectally 2 times daily (Patient taking differently: Place 1 application rectally 2 (two) times daily as needed for hemorrhoids. ) 28.35 g 0  . ipratropium (ATROVENT) 0.06 % nasal spray Place 2 sprays into both nostrils 4 (four) times daily. (Patient taking differently: Place 2 sprays into both nostrils 4 (four) times daily as needed for rhinitis. ) 15 mL 0  . lubiprostone (AMITIZA) 24 MCG capsule Take 24 mcg by mouth 2 (two) times daily with a meal.    . Multiple Vitamin (MULTIVITAMIN WITH MINERALS) TABS tablet Take 1 tablet by mouth daily.    . nitroGLYCERIN (NITROSTAT) 0.4 MG SL tablet Place 1 tablet (0.4 mg total) under the tongue every 5 (five) minutes as needed for chest pain. (Patient not taking: Reported on 05/18/2017) 20 tablet 12  . olmesartan (BENICAR) 40 MG tablet Take 40 mg by mouth every other day. Every Tuesday, Thursday, Saturday morning  1  . olmesartan-hydrochlorothiazide (BENICAR HCT) 40-12.5 MG per tablet Take 1 tablet by mouth every other day. every Sunday, Monday,  Wednesday, Friday morning    . ondansetron (ZOFRAN-ODT) 4 MG disintegrating tablet Take 1 tablet (4 mg total) by mouth every 6 (six) hours as needed for nausea or vomiting. 20 tablet 1  . ranitidine (ZANTAC) 150 MG tablet Take 1 tablet (150 mg total) by mouth 2 (two) times daily. (Patient taking differently: Take 150 mg by mouth at bedtime. ) 60 tablet 0  . sodium phosphate (FLEET) 7-19 GM/118ML ENEM Place 133 mLs (1 enema total) rectally daily as needed for severe constipation. Maximum 3 consecutive days 133 mL 0  . sucralfate (CARAFATE) 1 GM/10ML suspension Take 10 mLs (1 g total) by mouth 4 (four) times daily -  with meals and at bedtime. 420 mL 0   No current facility-administered medications for this visit.     Allergies as of 06/30/2017  . (No Known Allergies)    Family History  Problem Relation Age of Onset  . Breast cancer Daughter 21  .  Colon cancer Neg Hx     Social History   Socioeconomic History  . Marital status: Widowed    Spouse name: Not on file  . Number of children: 4  . Years of education: Not on file  . Highest education level: Not on file  Social Needs  . Financial resource strain: Not on file  . Food insecurity - worry: Not on file  . Food insecurity - inability: Not on file  . Transportation needs - medical: Not on file  . Transportation needs - non-medical: Not on file  Occupational History  . Occupation: retired  Tobacco Use  . Smoking status: Never Smoker  . Smokeless tobacco: Never Used  Substance and Sexual Activity  . Alcohol use: No  . Drug use: No  . Sexual activity: Not on file  Other Topics Concern  . Not on file  Social History Narrative  . Not on file    Review of Systems:    Constitutional: No weight loss, fever or chills Skin: No rash  Cardiovascular: No chest pain Respiratory: No SOB  Gastrointestinal: See HPI and otherwise negative Genitourinary: No dysuria  Neurological: + dizziness Musculoskeletal: No new muscle or joint  pain Hematologic: No bruising Psychiatric: No history of depression or anxiety   Physical Exam:  Vital signs: BP (!) 110/56   Pulse 89   Ht 4' 11.5" (1.511 m)   Wt 128 lb (58.1 kg)   BMI 25.42 kg/m   Constitutional:   Pleasant Elderly AA female appears to be in NAD, Well developed, Well nourished, alert and cooperative Head:  Normocephalic and atraumatic. Eyes:   PEERL, EOMI. No icterus. Conjunctiva pink. Ears:  Normal auditory acuity. Neck:  Supple Throat: Oral cavity and pharynx without inflammation, swelling or lesion.  Respiratory: Respirations even and unlabored. Lungs clear to auscultation bilaterally.   No wheezes, crackles, or rhonchi.  Cardiovascular: Normal S1, S2. No MRG. Regular rate and rhythm. No peripheral edema, cyanosis or pallor.  Gastrointestinal:  Soft, nondistended, nontender. No rebound or guarding. Normal bowel sounds. No appreciable masses or hepatomegaly. Rectal:  Not performed.  Msk:  Symmetrical without gross deformities. Without edema, no deformity or joint abnormality.  Neurologic:  Alert and  oriented x4;  grossly normal neurologically.  Skin:   Dry and intact without significant lesions or rashes. Psychiatric:  Demonstrates good judgement and reason without abnormal affect or behaviors.  RELEVANT LABS AND IMAGING: CBC    Component Value Date/Time   WBC 2.9 (L) 06/15/2017 1402   RBC 3.93 06/15/2017 1402   HGB 11.7 (L) 06/15/2017 1402   HCT 34.8 (L) 06/15/2017 1402   PLT 237 06/15/2017 1402   MCV 88.5 06/15/2017 1402   MCH 29.8 06/15/2017 1402   MCHC 33.6 06/15/2017 1402   RDW 13.1 06/15/2017 1402   LYMPHSABS 1.1 05/18/2017 0639   MONOABS 0.2 05/18/2017 0639   EOSABS 0.1 05/18/2017 0639   BASOSABS 0.0 05/18/2017 0639    CMP     Component Value Date/Time   NA 132 (L) 06/15/2017 1402   K 3.8 06/15/2017 1402   CL 97 (L) 06/15/2017 1402   CO2 24 06/15/2017 1402   GLUCOSE 120 (H) 06/15/2017 1402   BUN 8 06/15/2017 1402   CREATININE 0.87  06/15/2017 1402   CALCIUM 8.9 06/15/2017 1402   PROT 6.6 06/15/2017 1402   ALBUMIN 3.9 06/15/2017 1402   AST 22 06/15/2017 1402   ALT 13 (L) 06/15/2017 1402   ALKPHOS 58 06/15/2017 1402   BILITOT  0.6 06/15/2017 1402   GFRNONAA 56 (L) 06/15/2017 1402   GFRAA >60 06/15/2017 1402   Ct Abdomen Pelvis W Contrast  Result Date: 06/15/2017 CLINICAL DATA:  Acute generalized abdominal pain. EXAM: CT ABDOMEN AND PELVIS WITH CONTRAST TECHNIQUE: Multidetector CT imaging of the abdomen and pelvis was performed using the standard protocol following bolus administration of intravenous contrast. CONTRAST:  162mL ISOVUE-300 IOPAMIDOL (ISOVUE-300) INJECTION 61% COMPARISON:  CT scan of May 03, 2017. FINDINGS: Lower chest: No acute abnormality. Hepatobiliary: Status post cholecystectomy. No focal abnormality is noted in the liver. Stable intrahepatic and extrahepatic biliary dilatation is noted which most likely is due to post cholecystectomy status. Pancreas: Unremarkable. No pancreatic ductal dilatation or surrounding inflammatory changes. Spleen: Normal in size without focal abnormality. Adrenals/Urinary Tract: Adrenal glands appear normal. Stable left renal cysts are noted. No hydronephrosis or renal obstruction is noted. Mild urinary bladder distention is noted. No renal or ureteral calculi are noted. Stomach/Bowel: The stomach and appendix appear normal. Stable large duodenal diverticulum is noted. There is no evidence of bowel inflammation. Large amount of stool is noted in the rectum concerning for impaction. Vascular/Lymphatic: Aortic atherosclerosis. No enlarged abdominal or pelvic lymph nodes. Reproductive: Uterus and bilateral adnexa are unremarkable. Enlarged left pelvic varices are noted with enlarged left ovarian vein. Other: Small fat containing supraumbilical ventral hernia is noted. No abnormal fluid collection is noted. Musculoskeletal: No acute or significant osseous findings. IMPRESSION: Large  amount of stool is noted in rectum concerning for impaction. Small fat containing supraumbilical ventral hernia. Aortic atherosclerosis. Mild urinary bladder distention is noted. 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 Electronically Signed   By: Marijo Conception, M.D.   On: 06/15/2017 20:30     Assessment: 1.  Chronic constipation: Better now with Amitiza 24 twice a day MiraLAX once daily 2.  GERD: Chronic for the patient, mostly controlled on Nexium, Zantac and Carafate 3.  Hemoccult positive stool: In the ER hemoglobin stable, patient reports some bright red blood on toilet paper when wiping after constipated stool; most likely hemorrhoids  Plan: 1.  Ordered repeat CBC today.  If hemoglobin remains stable there is no need for further endoscopic workup as likely bleeding is related to internal hemorrhoids. 2.  Specifically discussed timing of medication with the patient today.  Recommend that she continue her Nexium 40 mg before lunch.  Zantac 150 milligrams twice a day, Carafate 4 times daily.  Recommend the patient take her nighttime dose before bed to see if this helps with hoarseness she is experiencing. 3.  Would recommend patient continue her regimen of Amitiza 24 mcg twice daily as well as MiraLAX once daily.  Discussed that she can increase up to 4 times a day of MiraLAX if needed.  She can also use occasional enemas. 4.  Patient to follow in clinic as needed with Dr. Ardis Hughs in the future.  Ellouise Newer, PA-C [facility] 06/30/2017, 10:01 AM  Cc: Velna Hatchet, MD

## 2017-06-30 NOTE — Progress Notes (Signed)
I agree with the above note, plan 

## 2017-06-30 NOTE — Patient Instructions (Addendum)
Your physician has requested that you go to the basement for the following lab work before leaving today: CBC  Stay on Amitiza 24 mcg twice a day  Stay on Miralax daily ( can use twice a day if needed )   Stop Colace  Add in another Carafate before bedtime.   Continue Zantac and Nexium

## 2017-07-01 DIAGNOSIS — K219 Gastro-esophageal reflux disease without esophagitis: Secondary | ICD-10-CM | POA: Diagnosis not present

## 2017-07-01 DIAGNOSIS — Z0000 Encounter for general adult medical examination without abnormal findings: Secondary | ICD-10-CM | POA: Diagnosis not present

## 2017-07-01 DIAGNOSIS — R634 Abnormal weight loss: Secondary | ICD-10-CM | POA: Diagnosis not present

## 2017-07-01 DIAGNOSIS — F418 Other specified anxiety disorders: Secondary | ICD-10-CM | POA: Diagnosis not present

## 2017-07-01 DIAGNOSIS — R413 Other amnesia: Secondary | ICD-10-CM | POA: Diagnosis not present

## 2017-07-01 DIAGNOSIS — K5909 Other constipation: Secondary | ICD-10-CM | POA: Diagnosis not present

## 2017-07-01 DIAGNOSIS — R7309 Other abnormal glucose: Secondary | ICD-10-CM | POA: Diagnosis not present

## 2017-07-01 DIAGNOSIS — K449 Diaphragmatic hernia without obstruction or gangrene: Secondary | ICD-10-CM | POA: Diagnosis not present

## 2017-07-01 DIAGNOSIS — M810 Age-related osteoporosis without current pathological fracture: Secondary | ICD-10-CM | POA: Diagnosis not present

## 2017-07-01 DIAGNOSIS — Z23 Encounter for immunization: Secondary | ICD-10-CM | POA: Diagnosis not present

## 2017-07-03 DIAGNOSIS — Z6824 Body mass index (BMI) 24.0-24.9, adult: Secondary | ICD-10-CM | POA: Diagnosis not present

## 2017-07-03 DIAGNOSIS — K219 Gastro-esophageal reflux disease without esophagitis: Secondary | ICD-10-CM | POA: Diagnosis not present

## 2017-07-03 DIAGNOSIS — M79662 Pain in left lower leg: Secondary | ICD-10-CM | POA: Diagnosis not present

## 2017-07-03 DIAGNOSIS — R0789 Other chest pain: Secondary | ICD-10-CM | POA: Diagnosis not present

## 2017-07-03 DIAGNOSIS — I10 Essential (primary) hypertension: Secondary | ICD-10-CM | POA: Diagnosis not present

## 2017-07-06 ENCOUNTER — Other Ambulatory Visit: Payer: Self-pay | Admitting: Gastroenterology

## 2017-07-09 DIAGNOSIS — M1991 Primary osteoarthritis, unspecified site: Secondary | ICD-10-CM | POA: Diagnosis not present

## 2017-07-09 DIAGNOSIS — F0390 Unspecified dementia without behavioral disturbance: Secondary | ICD-10-CM | POA: Diagnosis not present

## 2017-07-09 DIAGNOSIS — K589 Irritable bowel syndrome without diarrhea: Secondary | ICD-10-CM | POA: Diagnosis not present

## 2017-07-09 DIAGNOSIS — R7303 Prediabetes: Secondary | ICD-10-CM | POA: Diagnosis not present

## 2017-07-09 DIAGNOSIS — M25562 Pain in left knee: Secondary | ICD-10-CM | POA: Diagnosis not present

## 2017-07-09 DIAGNOSIS — M810 Age-related osteoporosis without current pathological fracture: Secondary | ICD-10-CM | POA: Diagnosis not present

## 2017-07-09 DIAGNOSIS — I10 Essential (primary) hypertension: Secondary | ICD-10-CM | POA: Diagnosis not present

## 2017-07-09 DIAGNOSIS — G8929 Other chronic pain: Secondary | ICD-10-CM | POA: Diagnosis not present

## 2017-07-09 DIAGNOSIS — F418 Other specified anxiety disorders: Secondary | ICD-10-CM | POA: Diagnosis not present

## 2017-07-14 DIAGNOSIS — F0390 Unspecified dementia without behavioral disturbance: Secondary | ICD-10-CM | POA: Diagnosis not present

## 2017-07-14 DIAGNOSIS — M1991 Primary osteoarthritis, unspecified site: Secondary | ICD-10-CM | POA: Diagnosis not present

## 2017-07-14 DIAGNOSIS — M25562 Pain in left knee: Secondary | ICD-10-CM | POA: Diagnosis not present

## 2017-07-14 DIAGNOSIS — G8929 Other chronic pain: Secondary | ICD-10-CM | POA: Diagnosis not present

## 2017-07-14 DIAGNOSIS — I10 Essential (primary) hypertension: Secondary | ICD-10-CM | POA: Diagnosis not present

## 2017-07-14 DIAGNOSIS — M810 Age-related osteoporosis without current pathological fracture: Secondary | ICD-10-CM | POA: Diagnosis not present

## 2017-07-17 ENCOUNTER — Telehealth: Payer: Self-pay | Admitting: Physician Assistant

## 2017-07-17 DIAGNOSIS — F0390 Unspecified dementia without behavioral disturbance: Secondary | ICD-10-CM | POA: Diagnosis not present

## 2017-07-17 DIAGNOSIS — M810 Age-related osteoporosis without current pathological fracture: Secondary | ICD-10-CM | POA: Diagnosis not present

## 2017-07-17 DIAGNOSIS — G8929 Other chronic pain: Secondary | ICD-10-CM | POA: Diagnosis not present

## 2017-07-17 DIAGNOSIS — I10 Essential (primary) hypertension: Secondary | ICD-10-CM | POA: Diagnosis not present

## 2017-07-17 DIAGNOSIS — M25562 Pain in left knee: Secondary | ICD-10-CM | POA: Diagnosis not present

## 2017-07-17 DIAGNOSIS — M1991 Primary osteoarthritis, unspecified site: Secondary | ICD-10-CM | POA: Diagnosis not present

## 2017-07-17 NOTE — Telephone Encounter (Signed)
Patient states she has questions concerning medication nexium and if she should continue to take it.

## 2017-07-17 NOTE — Telephone Encounter (Signed)
The pt has been advised to keep taking her nexium as prescribed.

## 2017-07-20 DIAGNOSIS — G8929 Other chronic pain: Secondary | ICD-10-CM | POA: Diagnosis not present

## 2017-07-20 DIAGNOSIS — M810 Age-related osteoporosis without current pathological fracture: Secondary | ICD-10-CM | POA: Diagnosis not present

## 2017-07-20 DIAGNOSIS — M1991 Primary osteoarthritis, unspecified site: Secondary | ICD-10-CM | POA: Diagnosis not present

## 2017-07-20 DIAGNOSIS — F0390 Unspecified dementia without behavioral disturbance: Secondary | ICD-10-CM | POA: Diagnosis not present

## 2017-07-20 DIAGNOSIS — I10 Essential (primary) hypertension: Secondary | ICD-10-CM | POA: Diagnosis not present

## 2017-07-20 DIAGNOSIS — M25562 Pain in left knee: Secondary | ICD-10-CM | POA: Diagnosis not present

## 2017-07-21 DIAGNOSIS — M25562 Pain in left knee: Secondary | ICD-10-CM | POA: Diagnosis not present

## 2017-07-21 DIAGNOSIS — G8929 Other chronic pain: Secondary | ICD-10-CM | POA: Diagnosis not present

## 2017-07-21 DIAGNOSIS — I10 Essential (primary) hypertension: Secondary | ICD-10-CM | POA: Diagnosis not present

## 2017-07-21 DIAGNOSIS — F0390 Unspecified dementia without behavioral disturbance: Secondary | ICD-10-CM | POA: Diagnosis not present

## 2017-07-21 DIAGNOSIS — M810 Age-related osteoporosis without current pathological fracture: Secondary | ICD-10-CM | POA: Diagnosis not present

## 2017-07-21 DIAGNOSIS — M1991 Primary osteoarthritis, unspecified site: Secondary | ICD-10-CM | POA: Diagnosis not present

## 2017-07-22 DIAGNOSIS — G8929 Other chronic pain: Secondary | ICD-10-CM | POA: Diagnosis not present

## 2017-07-22 DIAGNOSIS — M25562 Pain in left knee: Secondary | ICD-10-CM | POA: Diagnosis not present

## 2017-07-22 DIAGNOSIS — M810 Age-related osteoporosis without current pathological fracture: Secondary | ICD-10-CM | POA: Diagnosis not present

## 2017-07-22 DIAGNOSIS — F0390 Unspecified dementia without behavioral disturbance: Secondary | ICD-10-CM | POA: Diagnosis not present

## 2017-07-22 DIAGNOSIS — I10 Essential (primary) hypertension: Secondary | ICD-10-CM | POA: Diagnosis not present

## 2017-07-22 DIAGNOSIS — M1991 Primary osteoarthritis, unspecified site: Secondary | ICD-10-CM | POA: Diagnosis not present

## 2017-07-23 DIAGNOSIS — F0390 Unspecified dementia without behavioral disturbance: Secondary | ICD-10-CM | POA: Diagnosis not present

## 2017-07-23 DIAGNOSIS — M810 Age-related osteoporosis without current pathological fracture: Secondary | ICD-10-CM | POA: Diagnosis not present

## 2017-07-23 DIAGNOSIS — M1991 Primary osteoarthritis, unspecified site: Secondary | ICD-10-CM | POA: Diagnosis not present

## 2017-07-23 DIAGNOSIS — M25562 Pain in left knee: Secondary | ICD-10-CM | POA: Diagnosis not present

## 2017-07-23 DIAGNOSIS — G8929 Other chronic pain: Secondary | ICD-10-CM | POA: Diagnosis not present

## 2017-07-23 DIAGNOSIS — I10 Essential (primary) hypertension: Secondary | ICD-10-CM | POA: Diagnosis not present

## 2017-07-24 DIAGNOSIS — F0390 Unspecified dementia without behavioral disturbance: Secondary | ICD-10-CM | POA: Diagnosis not present

## 2017-07-24 DIAGNOSIS — M810 Age-related osteoporosis without current pathological fracture: Secondary | ICD-10-CM | POA: Diagnosis not present

## 2017-07-24 DIAGNOSIS — I10 Essential (primary) hypertension: Secondary | ICD-10-CM | POA: Diagnosis not present

## 2017-07-24 DIAGNOSIS — M25562 Pain in left knee: Secondary | ICD-10-CM | POA: Diagnosis not present

## 2017-07-24 DIAGNOSIS — G8929 Other chronic pain: Secondary | ICD-10-CM | POA: Diagnosis not present

## 2017-07-24 DIAGNOSIS — M1991 Primary osteoarthritis, unspecified site: Secondary | ICD-10-CM | POA: Diagnosis not present

## 2017-07-29 ENCOUNTER — Other Ambulatory Visit: Payer: Self-pay | Admitting: Gastroenterology

## 2017-07-29 DIAGNOSIS — I10 Essential (primary) hypertension: Secondary | ICD-10-CM | POA: Diagnosis not present

## 2017-07-29 DIAGNOSIS — M810 Age-related osteoporosis without current pathological fracture: Secondary | ICD-10-CM | POA: Diagnosis not present

## 2017-07-29 DIAGNOSIS — F0390 Unspecified dementia without behavioral disturbance: Secondary | ICD-10-CM | POA: Diagnosis not present

## 2017-07-29 DIAGNOSIS — M1991 Primary osteoarthritis, unspecified site: Secondary | ICD-10-CM | POA: Diagnosis not present

## 2017-07-29 DIAGNOSIS — G8929 Other chronic pain: Secondary | ICD-10-CM | POA: Diagnosis not present

## 2017-07-29 DIAGNOSIS — Z961 Presence of intraocular lens: Secondary | ICD-10-CM | POA: Diagnosis not present

## 2017-07-29 DIAGNOSIS — H1851 Endothelial corneal dystrophy: Secondary | ICD-10-CM | POA: Diagnosis not present

## 2017-07-29 DIAGNOSIS — H353131 Nonexudative age-related macular degeneration, bilateral, early dry stage: Secondary | ICD-10-CM | POA: Diagnosis not present

## 2017-07-29 DIAGNOSIS — M25562 Pain in left knee: Secondary | ICD-10-CM | POA: Diagnosis not present

## 2017-07-30 ENCOUNTER — Other Ambulatory Visit: Payer: Self-pay | Admitting: Gastroenterology

## 2017-07-30 MED ORDER — SUCRALFATE 1 GM/10ML PO SUSP: ORAL | 1 refills | Status: DC

## 2017-07-30 NOTE — Telephone Encounter (Signed)
Medication refilled

## 2017-07-31 ENCOUNTER — Other Ambulatory Visit: Payer: Self-pay

## 2017-07-31 ENCOUNTER — Telehealth: Payer: Self-pay | Admitting: Gastroenterology

## 2017-07-31 DIAGNOSIS — F0390 Unspecified dementia without behavioral disturbance: Secondary | ICD-10-CM | POA: Diagnosis not present

## 2017-07-31 DIAGNOSIS — M810 Age-related osteoporosis without current pathological fracture: Secondary | ICD-10-CM | POA: Diagnosis not present

## 2017-07-31 DIAGNOSIS — M25562 Pain in left knee: Secondary | ICD-10-CM | POA: Diagnosis not present

## 2017-07-31 DIAGNOSIS — M1991 Primary osteoarthritis, unspecified site: Secondary | ICD-10-CM | POA: Diagnosis not present

## 2017-07-31 DIAGNOSIS — G8929 Other chronic pain: Secondary | ICD-10-CM | POA: Diagnosis not present

## 2017-07-31 DIAGNOSIS — I10 Essential (primary) hypertension: Secondary | ICD-10-CM | POA: Diagnosis not present

## 2017-07-31 MED ORDER — SUCRALFATE 1 GM/10ML PO SUSP: ORAL | 11 refills | Status: DC

## 2017-07-31 NOTE — Telephone Encounter (Signed)
Patient requesting refill on her carafate. She states refill request was denied, do you still want her to continue with it, as she states it seems to be helping. Thanks.

## 2017-07-31 NOTE — Telephone Encounter (Signed)
Can refill for 12 mos. Thanks-JLL

## 2017-07-31 NOTE — Telephone Encounter (Signed)
[facility]ill sent to [facility].

## 2017-08-03 DIAGNOSIS — I10 Essential (primary) hypertension: Secondary | ICD-10-CM | POA: Diagnosis not present

## 2017-08-03 DIAGNOSIS — F0390 Unspecified dementia without behavioral disturbance: Secondary | ICD-10-CM | POA: Diagnosis not present

## 2017-08-03 DIAGNOSIS — M25562 Pain in left knee: Secondary | ICD-10-CM | POA: Diagnosis not present

## 2017-08-03 DIAGNOSIS — M810 Age-related osteoporosis without current pathological fracture: Secondary | ICD-10-CM | POA: Diagnosis not present

## 2017-08-03 DIAGNOSIS — G8929 Other chronic pain: Secondary | ICD-10-CM | POA: Diagnosis not present

## 2017-08-03 DIAGNOSIS — M1991 Primary osteoarthritis, unspecified site: Secondary | ICD-10-CM | POA: Diagnosis not present

## 2017-08-06 ENCOUNTER — Ambulatory Visit: Payer: Medicare HMO | Admitting: Physician Assistant

## 2017-08-14 ENCOUNTER — Telehealth: Payer: Self-pay | Admitting: Physician Assistant

## 2017-08-14 NOTE — Telephone Encounter (Signed)
Patient states she has questions on how to take medication nexium and questions on how long she needs to be on medication carafate. Patient requesting call back to discuss.

## 2017-08-17 NOTE — Telephone Encounter (Signed)
Attempted to call patient, fast busy signal.

## 2017-08-17 NOTE — Telephone Encounter (Signed)
Spoke to patient and informed her to continue current medication regimen until her next appointment. She verbalized understanding.

## 2017-08-27 ENCOUNTER — Telehealth: Payer: Self-pay | Admitting: Gastroenterology

## 2017-08-28 NOTE — Telephone Encounter (Signed)
The pt was advised to call her PCP regarding weight loss. The pt also asked about her nexium and zantac.  She was advised to take nexium 20-30 min prior to breakfast and dinner. Zantac at lunch and bedtime.  She will call back with any further concerns and will keep follow up as scheduled in June

## 2017-09-14 ENCOUNTER — Telehealth: Payer: Self-pay | Admitting: Gastroenterology

## 2017-09-15 ENCOUNTER — Other Ambulatory Visit: Payer: Self-pay | Admitting: Gastroenterology

## 2017-09-15 MED ORDER — ESOMEPRAZOLE MAGNESIUM 40 MG PO CPDR: 40.0000 mg | DELAYED_RELEASE_CAPSULE | Freq: Two times a day (BID) | ORAL | 11 refills | Status: DC

## 2017-09-16 DIAGNOSIS — R634 Abnormal weight loss: Secondary | ICD-10-CM | POA: Diagnosis not present

## 2017-09-16 DIAGNOSIS — R1032 Left lower quadrant pain: Secondary | ICD-10-CM | POA: Diagnosis not present

## 2017-09-16 DIAGNOSIS — Z6823 Body mass index (BMI) 23.0-23.9, adult: Secondary | ICD-10-CM | POA: Diagnosis not present

## 2017-09-16 DIAGNOSIS — K219 Gastro-esophageal reflux disease without esophagitis: Secondary | ICD-10-CM | POA: Diagnosis not present

## 2017-10-05 ENCOUNTER — Encounter (INDEPENDENT_AMBULATORY_CARE_PROVIDER_SITE_OTHER): Payer: Self-pay

## 2017-10-05 ENCOUNTER — Encounter

## 2017-10-05 ENCOUNTER — Encounter: Payer: Self-pay | Admitting: Gastroenterology

## 2017-10-05 ENCOUNTER — Ambulatory Visit: Payer: Medicare HMO | Admitting: Gastroenterology

## 2017-10-05 ENCOUNTER — Telehealth: Payer: Self-pay | Admitting: Gastroenterology

## 2017-10-05 VITALS — BP 120/70 | HR 74 | Ht 59.0 in | Wt 129.4 lb

## 2017-10-05 DIAGNOSIS — K5909 Other constipation: Secondary | ICD-10-CM | POA: Diagnosis not present

## 2017-10-05 NOTE — Telephone Encounter (Signed)
Patient requesting a prescription on miralax sent in so it will be cheaper for her. Patient seen for ov today 6.3.19.

## 2017-10-05 NOTE — Patient Instructions (Addendum)
Restart your amitiza, two pills daily (49mcg pills) with food. Continue taking miralax one dose once daily. Return to see Dr. Ardis Hughs as needed.  Normal BMI (Body Mass Index- based on height and weight) is between 23 and 30. Your BMI today is Body mass index is 26.14 kg/m. Ann Riddle Please consider follow up  regarding your BMI with your Primary Care Provider.

## 2017-10-05 NOTE — Progress Notes (Signed)
Review of gastrointestinal problems:  1. Chronic abdominal pain.Diagnosed as adhesive disease in the past by Dr. Lyla Son. CT scan September 2008 showed ventral hiatal hernia was stable. No acute findings in the abdomen or pelvis. January, 2010: repeat pains, went to emergency room, complete metabolic profile, amylase, lipase were normal. CT Scan January, 2010 with IV and oral contrast of abdomen and pelvis was normal. CT Scan December, 2010 done by ER, essentially normal. EGD January 2011: Mild gastritis, duodenitis. Biopsies showed no H. pylori. 2013 imaging: July [place] no clear cause of pain; July CT scan same; November CT scan same.  2. Chronic gastroesophageal reflux disease.Esophagogastroduodenoscopy July 2002 showed no Barrett's esophagus. EGD October 2008 was normal, except for a 2 cm hiatal hernia.  3. History of colon polyps,colonoscopy October 2004 by Dr. Lyla Son showed no colon polyps. He wrote the indication for that examination was adenomatous polyps. I do not see any pathology reports in our chart indicating adenomatous polyps. Repeat colonoscopy 01/2008 by [facility] was normal to terminal ileum except for small hemorrhoids.  4. Status post remote cholecystectomy  5. Nausea: Winter, 2010;EGD January 2011, see above    HPI: This is a pleasant 84 yo woman whom I last saw 2-3 years ago. She was last here in the office 3-4 months ago and she saw Anderson Malta at that time.  The focus during that visit was to get her bowels moving a bit more regularly she seem to be constipated and a recent CT scan done for abdominal pain showed a large stool burden in her rectum.     she was prescribed amitiza 1 pill twice daily and she took it for a few weeks.  She says her bowels moved a bit easier during that time    Her PCP office told her to stop it about 2 weeks ago but she thinks she was better with the [place].  She talked about confusion with several of her meds  Chief complaint is abdominal  pain  ROS: complete GI ROS as described in HPI, all other review negative.  Constitutional:  No unintentional weight loss   Past Medical History:  Diagnosis Date  . Diaphragmatic hernia without mention of obstruction or gangrene   . Diverticulosis of colon (without mention of hemorrhage)   . Esophageal reflux   . Irritable bowel syndrome   . Other and unspecified hyperlipidemia   . Unspecified essential hypertension   . Ventral hernia, unspecified, without mention of obstruction or gangrene     Past Surgical History:  Procedure Laterality Date  . CHOLECYSTECTOMY    . HIATAL HERNIA REPAIR      Current Outpatient Medications  Medication Sig Dispense Refill  . acetaminophen (TYLENOL) 500 MG tablet Take 500 mg by mouth every 6 (six) hours as needed for headache (pain).     Marland Kitchen alendronate (FOSAMAX) 70 MG tablet Take 70 mg by mouth every Monday.  3  . aspirin EC 81 MG tablet Take 81 mg by mouth daily.    . clonazePAM (KLONOPIN) 0.5 MG tablet Take 0.5 mg by mouth at bedtime as needed for anxiety (sleep).     . colesevelam (WELCHOL) 625 MG tablet Take 625 mg by mouth daily.     . diclofenac sodium (VOLTAREN) 1 % GEL Apply 4 g topically 4 (four) times daily. 100 g 0  . docusate sodium (COLACE) 100 MG capsule Take 1 capsule (100 mg total) by mouth every 12 (twelve) hours. 60 capsule 0  . ENSURE (ENSURE) Take  237 mLs by mouth daily.    Marland Kitchen esomeprazole (NEXIUM) 40 MG capsule Take 1 capsule (40 mg total) by mouth 2 (two) times daily before a meal. 60 capsule 11  . hydrocortisone (ANUSOL-HC) 2.5 % rectal cream Apply rectally 2 times daily (Patient taking differently: Place 1 application rectally 2 (two) times daily as needed for hemorrhoids. ) 28.35 g 0  . ipratropium (ATROVENT) 0.06 % nasal spray Place 2 sprays into both nostrils 4 (four) times daily. (Patient taking differently: Place 2 sprays into both nostrils 4 (four) times daily as needed for rhinitis. ) 15 mL 0  . Multiple Vitamin  (MULTIVITAMIN WITH MINERALS) TABS tablet Take 1 tablet by mouth daily.    . nitroGLYCERIN (NITROSTAT) 0.4 MG SL tablet Place 1 tablet (0.4 mg total) under the tongue every 5 (five) minutes as needed for chest pain. 20 tablet 12  . olmesartan (BENICAR) 40 MG tablet Take 40 mg by mouth every other day. Every Tuesday, Thursday, Saturday morning  1  . olmesartan-hydrochlorothiazide (BENICAR HCT) 40-12.5 MG per tablet Take 1 tablet by mouth every other day. every Sunday, Monday, Wednesday, Friday morning    . ondansetron (ZOFRAN-ODT) 4 MG disintegrating tablet Take 1 tablet (4 mg total) by mouth every 6 (six) hours as needed for nausea or vomiting. 20 tablet 1  . polyethylene glycol (MIRALAX / GLYCOLAX) packet Take 17 g by mouth daily.    . ranitidine (ZANTAC) 150 MG tablet Take 1 tablet (150 mg total) by mouth 2 (two) times daily. (Patient taking differently: Take 150 mg by mouth at bedtime. ) 60 tablet 0  . sodium phosphate (FLEET) 7-19 GM/118ML ENEM Place 133 mLs (1 enema total) rectally daily as needed for severe constipation. Maximum 3 consecutive days 133 mL 0  . sucralfate (CARAFATE) 1 GM/10ML suspension TAKE 10 MLS (1 G TOTAL) BY MOUTH 4 (FOUR) TIMES DAILY - WITH MEALS AND AT BEDTIME. 420 mL 11   No current facility-administered medications for this visit.     Allergies as of 10/05/2017  . (No Known Allergies)    Family History  Problem Relation Age of Onset  . Breast cancer Daughter 49  . Colon cancer Neg Hx     Social History   Socioeconomic History  . Marital status: Widowed    Spouse name: Not on file  . Number of children: 4  . Years of education: Not on file  . Highest education level: Not on file  Occupational History  . Occupation: retired  Scientific laboratory technician  . Financial resource strain: Not on file  . Food insecurity:    Worry: Not on file    Inability: Not on file  . Transportation needs:    Medical: Not on file    Non-medical: Not on file  Tobacco Use  . Smoking  status: Never Smoker  . Smokeless tobacco: Never Used  Substance and Sexual Activity  . Alcohol use: No  . Drug use: No  . Sexual activity: Not on file  Lifestyle  . Physical activity:    Days per week: Not on file    Minutes per session: Not on file  . Stress: Not on file  Relationships  . Social connections:    Talks on phone: Not on file    Gets together: Not on file    Attends religious service: Not on file    Active member of club or organization: Not on file    Attends meetings of clubs or organizations: Not on file  Relationship status: Not on file  . Intimate partner violence:    Fear of current or ex partner: Not on file    Emotionally abused: Not on file    Physically abused: Not on file    Forced sexual activity: Not on file  Other Topics Concern  . Not on file  Social History Narrative  . Not on file     Physical Exam: BP 120/70   Pulse 74   Ht 4\' 11"  (1.499 m)   Wt 129 lb 6.4 oz (58.7 kg)   BMI 26.14 kg/m  Constitutional: generally well-appearing Psychiatric: alert and oriented x3 Abdomen: soft, nontender, nondistended, no obvious ascites, no peritoneal signs, normal bowel sounds No peripheral edema noted in lower extremities  Assessment and plan: 84 y.o. female with chronic abdominal pains, likely functional and likely related to her chronic constipation  She has had 2 CAT scans in the past 6 months.  She is nearly 84 years old.  I recommended that she restart her amitiza 1 pill twice daily and she continue MiraLAX on a daily basis.  She should return to see me on as-needed basis.  Please see the "Patient Instructions" section for addition details about the plan.  Owens Loffler, MD [facility] 10/05/2017, 1:42 PM

## 2017-10-06 NOTE — Telephone Encounter (Signed)
[facility] stated they no longer have prescriptions for Marilax, this is an OTC only medication. They indicated the cost will be $6. Patient notified with this information. She voiced understanding and will pay the cost.

## 2017-10-27 ENCOUNTER — Telehealth: Payer: Self-pay | Admitting: Gastroenterology

## 2017-10-27 NOTE — Telephone Encounter (Signed)
Pt called to inform that her insurance no longer covers amitiza. She is requesting prescription for a similar medication. [facility] [address]

## 2017-10-29 NOTE — Telephone Encounter (Signed)
Pt is calling back regarding her medication

## 2017-10-30 NOTE — Telephone Encounter (Signed)
Pt states she called her insurance states  Linzess will be approved and is requesting it be sent her pharmacy states she is out of medication and is needing it sent in today.

## 2017-11-02 NOTE — Telephone Encounter (Signed)
linzess is OK with me. 154mcg pill, one pill once daily, disp 1 month with 11 refills. thanks

## 2017-11-03 ENCOUNTER — Other Ambulatory Visit: Payer: Self-pay | Admitting: Gastroenterology

## 2017-11-03 MED ORDER — LINACLOTIDE 145 MCG PO CAPS: 145.0000 ug | ORAL_CAPSULE | Freq: Every day | ORAL | 11 refills | Status: DC

## 2017-11-03 NOTE — Telephone Encounter (Signed)
Left message on voicemail that Linzess has been sent to  [facility] on [address]

## 2017-11-27 ENCOUNTER — Telehealth: Payer: Self-pay | Admitting: Gastroenterology

## 2017-11-27 MED ORDER — ONDANSETRON HCL 4 MG PO TABS: 4.0000 mg | ORAL_TABLET | Freq: Three times a day (TID) | ORAL | 1 refills | Status: DC | PRN

## 2017-11-27 NOTE — Telephone Encounter (Signed)
Pt reports she is having nausea and feels sick on her stomach. Script sent to pharmacy.

## 2017-11-27 NOTE — Telephone Encounter (Signed)
Pt states that she has nausea and stomach pain, wants to know if Dr. Ardis Hughs can prescribe something.

## 2017-12-24 DIAGNOSIS — K219 Gastro-esophageal reflux disease without esophagitis: Secondary | ICD-10-CM | POA: Diagnosis not present

## 2017-12-24 DIAGNOSIS — I10 Essential (primary) hypertension: Secondary | ICD-10-CM | POA: Diagnosis not present

## 2017-12-24 DIAGNOSIS — Z6823 Body mass index (BMI) 23.0-23.9, adult: Secondary | ICD-10-CM | POA: Diagnosis not present

## 2017-12-24 DIAGNOSIS — R413 Other amnesia: Secondary | ICD-10-CM | POA: Diagnosis not present

## 2017-12-24 DIAGNOSIS — R531 Weakness: Secondary | ICD-10-CM | POA: Diagnosis not present

## 2017-12-31 DIAGNOSIS — K589 Irritable bowel syndrome without diarrhea: Secondary | ICD-10-CM | POA: Diagnosis not present

## 2017-12-31 DIAGNOSIS — K5909 Other constipation: Secondary | ICD-10-CM | POA: Diagnosis not present

## 2017-12-31 DIAGNOSIS — Z86010 Personal history of colonic polyps: Secondary | ICD-10-CM | POA: Diagnosis not present

## 2017-12-31 DIAGNOSIS — K5730 Diverticulosis of large intestine without perforation or abscess without bleeding: Secondary | ICD-10-CM | POA: Diagnosis not present

## 2017-12-31 DIAGNOSIS — M1991 Primary osteoarthritis, unspecified site: Secondary | ICD-10-CM | POA: Diagnosis not present

## 2017-12-31 DIAGNOSIS — M810 Age-related osteoporosis without current pathological fracture: Secondary | ICD-10-CM | POA: Diagnosis not present

## 2017-12-31 DIAGNOSIS — I10 Essential (primary) hypertension: Secondary | ICD-10-CM | POA: Diagnosis not present

## 2017-12-31 DIAGNOSIS — K219 Gastro-esophageal reflux disease without esophagitis: Secondary | ICD-10-CM | POA: Diagnosis not present

## 2017-12-31 DIAGNOSIS — R413 Other amnesia: Secondary | ICD-10-CM | POA: Diagnosis not present

## 2018-01-05 ENCOUNTER — Telehealth: Payer: Self-pay

## 2018-01-05 NOTE — Telephone Encounter (Signed)
The pt has been advised that she can have any food that does not bother her.  She will call back with any further concerns

## 2018-01-07 ENCOUNTER — Telehealth: Payer: Self-pay | Admitting: Gastroenterology

## 2018-01-07 DIAGNOSIS — Z86010 Personal history of colonic polyps: Secondary | ICD-10-CM | POA: Diagnosis not present

## 2018-01-07 DIAGNOSIS — K589 Irritable bowel syndrome without diarrhea: Secondary | ICD-10-CM | POA: Diagnosis not present

## 2018-01-07 DIAGNOSIS — M810 Age-related osteoporosis without current pathological fracture: Secondary | ICD-10-CM | POA: Diagnosis not present

## 2018-01-07 DIAGNOSIS — M1991 Primary osteoarthritis, unspecified site: Secondary | ICD-10-CM | POA: Diagnosis not present

## 2018-01-07 DIAGNOSIS — K219 Gastro-esophageal reflux disease without esophagitis: Secondary | ICD-10-CM | POA: Diagnosis not present

## 2018-01-07 DIAGNOSIS — R413 Other amnesia: Secondary | ICD-10-CM | POA: Diagnosis not present

## 2018-01-07 DIAGNOSIS — I10 Essential (primary) hypertension: Secondary | ICD-10-CM | POA: Diagnosis not present

## 2018-01-07 DIAGNOSIS — K5909 Other constipation: Secondary | ICD-10-CM | POA: Diagnosis not present

## 2018-01-07 DIAGNOSIS — K5730 Diverticulosis of large intestine without perforation or abscess without bleeding: Secondary | ICD-10-CM | POA: Diagnosis not present

## 2018-01-07 NOTE — Telephone Encounter (Signed)
Left message on machine to call back  

## 2018-01-08 NOTE — Telephone Encounter (Signed)
The pt states she believes Linzess 145 mcg is too strong for her and wants to know if she can take it every other days instead of daily.  She was advised to try it and call back and up date on her condition if she doesn't  notice and improvement. The pt has been advised of the information and verbalized understanding.

## 2018-01-12 DIAGNOSIS — I10 Essential (primary) hypertension: Secondary | ICD-10-CM | POA: Diagnosis not present

## 2018-01-12 DIAGNOSIS — K5730 Diverticulosis of large intestine without perforation or abscess without bleeding: Secondary | ICD-10-CM | POA: Diagnosis not present

## 2018-01-12 DIAGNOSIS — K5909 Other constipation: Secondary | ICD-10-CM | POA: Diagnosis not present

## 2018-01-12 DIAGNOSIS — Z86010 Personal history of colonic polyps: Secondary | ICD-10-CM | POA: Diagnosis not present

## 2018-01-12 DIAGNOSIS — M810 Age-related osteoporosis without current pathological fracture: Secondary | ICD-10-CM | POA: Diagnosis not present

## 2018-01-12 DIAGNOSIS — M1991 Primary osteoarthritis, unspecified site: Secondary | ICD-10-CM | POA: Diagnosis not present

## 2018-01-12 DIAGNOSIS — K589 Irritable bowel syndrome without diarrhea: Secondary | ICD-10-CM | POA: Diagnosis not present

## 2018-01-12 DIAGNOSIS — R413 Other amnesia: Secondary | ICD-10-CM | POA: Diagnosis not present

## 2018-01-12 DIAGNOSIS — K219 Gastro-esophageal reflux disease without esophagitis: Secondary | ICD-10-CM | POA: Diagnosis not present

## 2018-01-18 DIAGNOSIS — I10 Essential (primary) hypertension: Secondary | ICD-10-CM | POA: Diagnosis not present

## 2018-01-18 DIAGNOSIS — K5909 Other constipation: Secondary | ICD-10-CM | POA: Diagnosis not present

## 2018-01-18 DIAGNOSIS — R413 Other amnesia: Secondary | ICD-10-CM | POA: Diagnosis not present

## 2018-01-18 DIAGNOSIS — Z86010 Personal history of colonic polyps: Secondary | ICD-10-CM | POA: Diagnosis not present

## 2018-01-18 DIAGNOSIS — K589 Irritable bowel syndrome without diarrhea: Secondary | ICD-10-CM | POA: Diagnosis not present

## 2018-01-18 DIAGNOSIS — K5730 Diverticulosis of large intestine without perforation or abscess without bleeding: Secondary | ICD-10-CM | POA: Diagnosis not present

## 2018-01-18 DIAGNOSIS — M810 Age-related osteoporosis without current pathological fracture: Secondary | ICD-10-CM | POA: Diagnosis not present

## 2018-01-18 DIAGNOSIS — K219 Gastro-esophageal reflux disease without esophagitis: Secondary | ICD-10-CM | POA: Diagnosis not present

## 2018-01-18 DIAGNOSIS — M1991 Primary osteoarthritis, unspecified site: Secondary | ICD-10-CM | POA: Diagnosis not present

## 2018-01-19 DIAGNOSIS — I10 Essential (primary) hypertension: Secondary | ICD-10-CM | POA: Diagnosis not present

## 2018-01-19 DIAGNOSIS — K219 Gastro-esophageal reflux disease without esophagitis: Secondary | ICD-10-CM | POA: Diagnosis not present

## 2018-01-19 DIAGNOSIS — K5909 Other constipation: Secondary | ICD-10-CM | POA: Diagnosis not present

## 2018-01-19 DIAGNOSIS — M1991 Primary osteoarthritis, unspecified site: Secondary | ICD-10-CM | POA: Diagnosis not present

## 2018-01-19 DIAGNOSIS — K589 Irritable bowel syndrome without diarrhea: Secondary | ICD-10-CM | POA: Diagnosis not present

## 2018-01-19 DIAGNOSIS — Z86010 Personal history of colonic polyps: Secondary | ICD-10-CM | POA: Diagnosis not present

## 2018-01-19 DIAGNOSIS — R413 Other amnesia: Secondary | ICD-10-CM | POA: Diagnosis not present

## 2018-01-19 DIAGNOSIS — K5730 Diverticulosis of large intestine without perforation or abscess without bleeding: Secondary | ICD-10-CM | POA: Diagnosis not present

## 2018-01-19 DIAGNOSIS — M810 Age-related osteoporosis without current pathological fracture: Secondary | ICD-10-CM | POA: Diagnosis not present

## 2018-01-20 ENCOUNTER — Telehealth: Payer: Self-pay | Admitting: Gastroenterology

## 2018-01-20 NOTE — Telephone Encounter (Signed)
Left message on machine to call back  

## 2018-01-21 NOTE — Telephone Encounter (Signed)
The pt returned call and states she thinks she knows why she was having worse reflux.  She altered her diet and things have improved.  She will call back with any further concerns

## 2018-01-21 NOTE — Telephone Encounter (Signed)
I called the pt and her phone was having a bad connection.  I will try again later

## 2018-01-26 DIAGNOSIS — K589 Irritable bowel syndrome without diarrhea: Secondary | ICD-10-CM | POA: Diagnosis not present

## 2018-01-26 DIAGNOSIS — K5730 Diverticulosis of large intestine without perforation or abscess without bleeding: Secondary | ICD-10-CM | POA: Diagnosis not present

## 2018-01-26 DIAGNOSIS — M1991 Primary osteoarthritis, unspecified site: Secondary | ICD-10-CM | POA: Diagnosis not present

## 2018-01-26 DIAGNOSIS — K219 Gastro-esophageal reflux disease without esophagitis: Secondary | ICD-10-CM | POA: Diagnosis not present

## 2018-01-26 DIAGNOSIS — M810 Age-related osteoporosis without current pathological fracture: Secondary | ICD-10-CM | POA: Diagnosis not present

## 2018-01-26 DIAGNOSIS — R413 Other amnesia: Secondary | ICD-10-CM | POA: Diagnosis not present

## 2018-01-26 DIAGNOSIS — K5909 Other constipation: Secondary | ICD-10-CM | POA: Diagnosis not present

## 2018-01-26 DIAGNOSIS — Z86010 Personal history of colonic polyps: Secondary | ICD-10-CM | POA: Diagnosis not present

## 2018-01-26 DIAGNOSIS — I10 Essential (primary) hypertension: Secondary | ICD-10-CM | POA: Diagnosis not present

## 2018-01-27 ENCOUNTER — Telehealth: Payer: Self-pay | Admitting: Gastroenterology

## 2018-01-27 NOTE — Telephone Encounter (Signed)
Left message on machine to call back  

## 2018-01-27 NOTE — Telephone Encounter (Signed)
Patient states she is having chest pain, which she thinks is from heartburn. Patient does not think her heartburn medicine is working and wants to speak with a nurse. See phone note 9.19.19 as well.

## 2018-01-28 NOTE — Telephone Encounter (Signed)
The pt has been advised to try Pepcid at bedtime instead of zantac and call back if this does not help.

## 2018-02-02 DIAGNOSIS — K5730 Diverticulosis of large intestine without perforation or abscess without bleeding: Secondary | ICD-10-CM | POA: Diagnosis not present

## 2018-02-02 DIAGNOSIS — K589 Irritable bowel syndrome without diarrhea: Secondary | ICD-10-CM | POA: Diagnosis not present

## 2018-02-02 DIAGNOSIS — Z86010 Personal history of colonic polyps: Secondary | ICD-10-CM | POA: Diagnosis not present

## 2018-02-02 DIAGNOSIS — R413 Other amnesia: Secondary | ICD-10-CM | POA: Diagnosis not present

## 2018-02-02 DIAGNOSIS — K5909 Other constipation: Secondary | ICD-10-CM | POA: Diagnosis not present

## 2018-02-02 DIAGNOSIS — K219 Gastro-esophageal reflux disease without esophagitis: Secondary | ICD-10-CM | POA: Diagnosis not present

## 2018-02-02 DIAGNOSIS — M1991 Primary osteoarthritis, unspecified site: Secondary | ICD-10-CM | POA: Diagnosis not present

## 2018-02-02 DIAGNOSIS — I10 Essential (primary) hypertension: Secondary | ICD-10-CM | POA: Diagnosis not present

## 2018-02-02 DIAGNOSIS — M810 Age-related osteoporosis without current pathological fracture: Secondary | ICD-10-CM | POA: Diagnosis not present

## 2018-02-09 DIAGNOSIS — K589 Irritable bowel syndrome without diarrhea: Secondary | ICD-10-CM | POA: Diagnosis not present

## 2018-02-09 DIAGNOSIS — K5909 Other constipation: Secondary | ICD-10-CM | POA: Diagnosis not present

## 2018-02-09 DIAGNOSIS — M1991 Primary osteoarthritis, unspecified site: Secondary | ICD-10-CM | POA: Diagnosis not present

## 2018-02-09 DIAGNOSIS — K5730 Diverticulosis of large intestine without perforation or abscess without bleeding: Secondary | ICD-10-CM | POA: Diagnosis not present

## 2018-02-09 DIAGNOSIS — I10 Essential (primary) hypertension: Secondary | ICD-10-CM | POA: Diagnosis not present

## 2018-02-09 DIAGNOSIS — K219 Gastro-esophageal reflux disease without esophagitis: Secondary | ICD-10-CM | POA: Diagnosis not present

## 2018-02-09 DIAGNOSIS — Z86010 Personal history of colonic polyps: Secondary | ICD-10-CM | POA: Diagnosis not present

## 2018-02-09 DIAGNOSIS — M810 Age-related osteoporosis without current pathological fracture: Secondary | ICD-10-CM | POA: Diagnosis not present

## 2018-02-09 DIAGNOSIS — R413 Other amnesia: Secondary | ICD-10-CM | POA: Diagnosis not present

## 2018-02-10 ENCOUNTER — Other Ambulatory Visit: Payer: Self-pay

## 2018-02-10 ENCOUNTER — Emergency Department (HOSPITAL_COMMUNITY)
Admission: EM | Admit: 2018-02-10 | Discharge: 2018-02-10 | Disposition: A | Discharge status: Home / Self Care | Payer: Medicare HMO | Attending: Emergency Medicine | Admitting: Emergency Medicine

## 2018-02-10 ENCOUNTER — Encounter (HOSPITAL_COMMUNITY): Payer: Self-pay | Admitting: Emergency Medicine

## 2018-02-10 ENCOUNTER — Emergency Department (HOSPITAL_COMMUNITY): Payer: Medicare HMO

## 2018-02-10 DIAGNOSIS — Z79899 Other long term (current) drug therapy: Secondary | ICD-10-CM | POA: Insufficient documentation

## 2018-02-10 DIAGNOSIS — Z7982 Long term (current) use of aspirin: Secondary | ICD-10-CM | POA: Diagnosis not present

## 2018-02-10 DIAGNOSIS — S29012A Strain of muscle and tendon of back wall of thorax, initial encounter: Secondary | ICD-10-CM | POA: Diagnosis not present

## 2018-02-10 DIAGNOSIS — I10 Essential (primary) hypertension: Secondary | ICD-10-CM | POA: Insufficient documentation

## 2018-02-10 DIAGNOSIS — R0789 Other chest pain: Secondary | ICD-10-CM | POA: Diagnosis not present

## 2018-02-10 DIAGNOSIS — R079 Chest pain, unspecified: Secondary | ICD-10-CM | POA: Diagnosis not present

## 2018-02-10 DIAGNOSIS — T148XXA Other injury of unspecified body region, initial encounter: Secondary | ICD-10-CM

## 2018-02-10 LAB — BASIC METABOLIC PANEL
Anion gap: 7 (ref 5–15)
BUN: 15 mg/dL (ref 8–23)
CHLORIDE: 104 mmol/L (ref 98–111)
CO2: 27 mmol/L (ref 22–32)
Calcium: 9.3 mg/dL (ref 8.9–10.3)
Creatinine, Ser: 0.87 mg/dL (ref 0.44–1.00)
GFR calc Af Amer: >60 mL/min (ref >60)
GFR calc non Af Amer: 55 mL/min — ABNORMAL LOW (ref >60)
Glucose, Bld: 88 mg/dL (ref 70–99)
POTASSIUM: 3.7 mmol/L (ref 3.5–5.1)
Sodium: 138 mmol/L (ref 135–145)

## 2018-02-10 LAB — CBC
HCT: 36.4 % (ref 36.0–46.0)
HEMOGLOBIN: 11.4 g/dL — AB (ref 12.0–15.0)
MCH: 28.4 pg (ref 26.0–34.0)
MCHC: 31.3 g/dL (ref 30.0–36.0)
MCV: 90.8 fL (ref 80.0–100.0)
Platelets: 223 10*3/uL (ref 150–400)
RBC: 4.01 MIL/uL (ref 3.87–5.11)
RDW: 13.5 % (ref 11.5–15.5)
WBC: 3.1 10*3/uL — AB (ref 4.0–10.5)
nRBC: 0 % (ref 0.0–0.2)

## 2018-02-10 LAB — I-STAT TROPONIN, ED: Troponin i, poc: 0.01 ng/mL (ref 0.00–0.08)

## 2018-02-10 MED ORDER — DICLOFENAC SODIUM 1 % TD GEL: 2.0000 g | Freq: Four times a day (QID) | TRANSDERMAL | 0 refills | Status: AC | PRN

## 2018-02-10 NOTE — ED Triage Notes (Signed)
Onset 2-3 weeks ago intermittent chest pain. States pain last night "burned a lot like it is on fire" then it is sore. Pain resolved and then returned prior to arrival. Currently denies pain or burning however concerned why it returns.

## 2018-02-10 NOTE — ED Provider Notes (Signed)
[facility]PART[facility] Provider Note   CSN: [id number] Arrival date & time: 02/10/18  1217     History   Chief Complaint Chief Complaint  Patient presents with  . Chest Pain    HPI Ann Riddle is a 84 y.o. female.  The history is provided by the patient.  Chest Pain   This is a new problem. The current episode started yesterday. The problem occurs constantly. The problem has not changed since onset.Associated with: tender to left side of chest wall. The pain is present in the lateral region. The pain is at a severity of 2/10. The pain is mild. The quality of the pain is described as sharp (only to palpation). The pain does not radiate. Pertinent negatives include no abdominal pain, no back pain, no cough, no exertional chest pressure, no fever, no hemoptysis, no near-syncope, no numbness, no palpitations, no shortness of breath and no vomiting. Risk factors include being elderly.  Pertinent negatives for past medical history include no CAD, no CHF, no hyperlipidemia, no hypertension and no seizures.    Past Medical History:  Diagnosis Date  . Diaphragmatic hernia without mention of obstruction or gangrene   . Diverticulosis of colon (without mention of hemorrhage)   . Esophageal reflux   . Irritable bowel syndrome   . Other and unspecified hyperlipidemia   . Unspecified essential hypertension   . Ventral hernia, unspecified, without mention of obstruction or gangrene     Patient Active Problem List   Diagnosis Date Noted  . Chest pain 11/13/2015  . Constipation 11/10/2007  . HYPERLIPIDEMIA 08/04/2007  . Essential hypertension 08/04/2007  . GERD 08/04/2007  . IBS 08/04/2007    Past Surgical History:  Procedure Laterality Date  . CHOLECYSTECTOMY    . HIATAL HERNIA REPAIR       OB History   None      Home Medications    Prior to Admission medications   Medication Sig Start Date End Date Taking? Authorizing Provider    acetaminophen (TYLENOL) 500 MG tablet Take 500 mg by mouth every 6 (six) hours as needed for headache (pain).  02/14/17   [provider]  alendronate (FOSAMAX) 70 MG tablet Take 70 mg by mouth every Monday. 05/20/17   [provider]  aspirin EC 81 MG tablet Take 81 mg by mouth daily.    [provider]  clonazePAM (KLONOPIN) 0.5 MG tablet Take 0.5 mg by mouth at bedtime as needed for anxiety (sleep).     [provider]  colesevelam (WELCHOL) 625 MG tablet Take 625 mg by mouth daily.     [provider]  diclofenac sodium (VOLTAREN) 1 % GEL Apply 2 g topically 4 (four) times daily as needed. 02/10/18 03/12/18  Dezmond Downie, DO  docusate sodium (COLACE) 100 MG capsule Take 1 capsule (100 mg total) by mouth every 12 (twelve) hours. 08/18/15   Melony Overly, MD  ENSURE (ENSURE) Take 237 mLs by mouth daily.    [provider]  esomeprazole (NEXIUM) 40 MG capsule Take 1 capsule (40 mg total) by mouth 2 (two) times daily before a meal. 09/15/17   Milus Banister, MD  hydrocortisone (ANUSOL-HC) 2.5 % rectal cream Apply rectally 2 times daily Patient taking differently: Place 1 application rectally 2 (two) times daily as needed for hemorrhoids.  08/18/15   Melony Overly, MD  ipratropium (ATROVENT) 0.06 % nasal spray Place 2 sprays into both nostrils 4 (four) times daily. Patient  taking differently: Place 2 sprays into both nostrils 4 (four) times daily as needed for rhinitis.  01/01/17   Lysbeth Penner, FNP  linaclotide [facility]) 145 MCG CAPS capsule Take 1 capsule (145 mcg total) by mouth daily before breakfast. 11/03/17   Milus Banister, MD  Multiple Vitamin (MULTIVITAMIN WITH MINERALS) TABS tablet Take 1 tablet by mouth daily.    [provider]  nitroGLYCERIN (NITROSTAT) 0.4 MG SL tablet Place 1 tablet (0.4 mg total) under the tongue every 5 (five) minutes as needed for chest pain. 11/14/15   Thurnell Lose, MD  olmesartan (BENICAR) 40  MG tablet Take 40 mg by mouth every other day. Every Tuesday, Thursday, Saturday morning 04/27/17   [provider]  olmesartan-hydrochlorothiazide (BENICAR HCT) 40-12.5 MG per tablet Take 1 tablet by mouth every other day. every Sunday, Monday, Wednesday, Friday morning    [provider]  ondansetron (ZOFRAN) 4 MG tablet Take 1 tablet (4 mg total) by mouth every 8 (eight) hours as needed for nausea or vomiting. 11/27/17   Zehr, Janett Billow D, PA-C  ondansetron (ZOFRAN-ODT) 4 MG disintegrating tablet Take 1 tablet (4 mg total) by mouth every 6 (six) hours as needed for nausea or vomiting. 06/18/17   Milus Banister, MD  polyethylene glycol (MIRALAX / Floria Raveling) packet Take 17 g by mouth daily.    [provider]  ranitidine (ZANTAC) 150 MG tablet Take 1 tablet (150 mg total) by mouth 2 (two) times daily. Patient taking differently: Take 150 mg by mouth at bedtime.  01/06/17   Muthersbaugh, Jarrett Soho, PA-C  sodium phosphate (FLEET) 7-19 GM/118ML ENEM Place 133 mLs (1 enema total) rectally daily as needed for severe constipation. Maximum 3 consecutive days 06/15/17   Couture, Cortni S, PA-C  sucralfate (CARAFATE) 1 GM/10ML suspension TAKE 10 MLS (1 G TOTAL) BY MOUTH 4 (FOUR) TIMES DAILY - WITH MEALS AND AT BEDTIME. 07/31/17   Levin Erp, PA    Family History Family History  Problem Relation Age of Onset  . Breast cancer Daughter 30  . Colon cancer Neg Hx     Social History Social History   Tobacco Use  . Smoking status: Never Smoker  . Smokeless tobacco: Never Used  Substance Use Topics  . Alcohol use: No  . Drug use: No     Allergies   Patient has no known allergies.   Review of Systems Review of Systems  Constitutional: Negative for chills and fever.  HENT: Negative for ear pain and sore throat.   Eyes: Negative for pain and visual disturbance.  Respiratory: Negative for cough, hemoptysis and shortness of breath.   Cardiovascular: Positive for chest  pain. Negative for palpitations and near-syncope.  Gastrointestinal: Negative for abdominal pain and vomiting.  Genitourinary: Negative for dysuria and hematuria.  Musculoskeletal: Negative for arthralgias and back pain.  Skin: Negative for color change and rash.  Neurological: Negative for seizures, syncope and numbness.  All other systems reviewed and are negative.    Physical Exam Updated Vital Signs BP (!) 152/67   Pulse 72   Temp 98.4 F (36.9 C) (Oral)   Resp 19   Ht 4\' 11"  (1.499 m)   Wt 59 kg   SpO2 100%   BMI 26.26 kg/m   Physical Exam  Constitutional: She appears well-developed and well-nourished. No distress.  HENT:  Head: Normocephalic and atraumatic.  Eyes: Pupils are equal, round, and reactive to light. Conjunctivae and EOM are normal.  Neck: Normal range of  motion. Neck supple.  Cardiovascular: Normal rate, regular rhythm, intact distal pulses and normal pulses.  No murmur heard. Pulmonary/Chest: Effort normal and breath sounds normal. No respiratory distress. She has no decreased breath sounds. She has no wheezes.  Abdominal: Soft. There is no tenderness.  Musculoskeletal: She exhibits no edema.       Right lower leg: She exhibits no edema.       Left lower leg: She exhibits no edema.  TTP over left side of chest wall  Neurological: She is alert.  Skin: Skin is warm and dry.  Psychiatric: She has a normal mood and affect.  Nursing note and vitals reviewed.    ED Treatments / Results  Labs (all labs ordered are listed, but only abnormal results are displayed) Labs Reviewed  BASIC METABOLIC PANEL - Abnormal; Notable for the following components:      Result Value   GFR calc non Af Amer 55 (*)    All other components within normal limits  CBC - Abnormal; Notable for the following components:   WBC 3.1 (*)    Hemoglobin 11.4 (*)    All other components within normal limits  I-STAT TROPONIN, ED    EKG EKG Interpretation  Date/Time:  Wednesday  February 10 2018 12:28:52 EDT Ventricular Rate:  75 PR Interval:  146 QRS Duration: 60 QT Interval:  374 QTC Calculation: 417 R Axis:   27 Text Interpretation:  Normal sinus rhythm Normal ECG Confirmed by Lennice Sites [phone]) on 02/10/2018 5:37:14 PM   Radiology Dg Chest 2 View  Result Date: 02/10/2018 CLINICAL DATA:  Intermittent left-sided chest pain beginning 2-3 weeks ago with increased severity and a burning sensation last night followed by soreness. EXAM: CHEST - 2 VIEW COMPARISON:  PA and lateral chest x-ray of June 15, 2017. FINDINGS: The lungs are well-expanded and clear. The heart is top-normal in size. The pulmonary vascularity is normal. The mediastinum is normal in width. There is calcification in the wall of the aortic arch and within the mitral valvular annulus. There is no pleural effusion. The bony thorax exhibits no acute abnormality. IMPRESSION: Mild chronic bronchitic changes, stable. No pneumonia, CHF, nor other acute cardiopulmonary abnormality. Electronically Signed   By: David  Martinique M.D.   On: 02/10/2018 13:27    Procedures Procedures (including critical care time)  Medications Ordered in ED Medications - No data to display   Initial Impression / Assessment and Plan / ED Course  I have reviewed the triage vital signs and the nursing notes.  Pertinent labs & imaging results that were available during my care of the patient were reviewed by me and considered in my medical decision making (see chart for details).     Ann Riddle is a 84 year old female history of hypertension, high cholesterol who presents to the ED with chest wall pain.  Patient with normal vitals.  No fever.  Patient with left-sided chest wall pain.  EKG done in triage shows no signs of ischemic changes.  Unchanged from prior.  Patient is tender over the left side of her chest wall that is consistent with a musculoskeletal strain.  She says it hurts worse when she moves her left arm.  She  denies any diaphoresis, nausea, vomiting, shortness of breath.  Troponin within normal limits.  No significant anemia, electrolyte abnormality, kidney injury.  Doubt ACS.  Doubt PE.  History and physical is consistent with a muscle strain.  Prescribed Voltaren gel.  Discharged from ED in good condition.  Told to follow-up with primary care doctor.  Final Clinical Impressions(s) / ED Diagnoses   Final diagnoses:  Chest wall pain  Muscle strain    ED Discharge Orders         Ordered    diclofenac sodium (VOLTAREN) 1 % GEL  4 times daily PRN     02/10/18 1836           Lennice Sites, DO 02/10/18 1925

## 2018-02-16 DIAGNOSIS — I10 Essential (primary) hypertension: Secondary | ICD-10-CM | POA: Diagnosis not present

## 2018-02-16 DIAGNOSIS — K5730 Diverticulosis of large intestine without perforation or abscess without bleeding: Secondary | ICD-10-CM | POA: Diagnosis not present

## 2018-02-16 DIAGNOSIS — K589 Irritable bowel syndrome without diarrhea: Secondary | ICD-10-CM | POA: Diagnosis not present

## 2018-02-16 DIAGNOSIS — Z86010 Personal history of colonic polyps: Secondary | ICD-10-CM | POA: Diagnosis not present

## 2018-02-16 DIAGNOSIS — K5909 Other constipation: Secondary | ICD-10-CM | POA: Diagnosis not present

## 2018-02-16 DIAGNOSIS — R413 Other amnesia: Secondary | ICD-10-CM | POA: Diagnosis not present

## 2018-02-16 DIAGNOSIS — M1991 Primary osteoarthritis, unspecified site: Secondary | ICD-10-CM | POA: Diagnosis not present

## 2018-02-16 DIAGNOSIS — K219 Gastro-esophageal reflux disease without esophagitis: Secondary | ICD-10-CM | POA: Diagnosis not present

## 2018-02-16 DIAGNOSIS — M810 Age-related osteoporosis without current pathological fracture: Secondary | ICD-10-CM | POA: Diagnosis not present

## 2018-02-18 ENCOUNTER — Telehealth: Payer: Self-pay | Admitting: Gastroenterology

## 2018-02-18 DIAGNOSIS — I10 Essential (primary) hypertension: Secondary | ICD-10-CM | POA: Diagnosis not present

## 2018-02-18 DIAGNOSIS — F419 Anxiety disorder, unspecified: Secondary | ICD-10-CM | POA: Diagnosis not present

## 2018-02-18 DIAGNOSIS — R0789 Other chest pain: Secondary | ICD-10-CM | POA: Diagnosis not present

## 2018-02-18 DIAGNOSIS — M1990 Unspecified osteoarthritis, unspecified site: Secondary | ICD-10-CM | POA: Diagnosis not present

## 2018-02-18 DIAGNOSIS — R269 Unspecified abnormalities of gait and mobility: Secondary | ICD-10-CM | POA: Diagnosis not present

## 2018-02-18 DIAGNOSIS — K219 Gastro-esophageal reflux disease without esophagitis: Secondary | ICD-10-CM | POA: Diagnosis not present

## 2018-02-18 NOTE — Telephone Encounter (Signed)
The pt was seen in the ED for chest pain and was advised that she needs to see GI for follow up.  Appt made for 03/22/18 at 1115 am.

## 2018-02-26 ENCOUNTER — Telehealth: Payer: Self-pay | Admitting: Gastroenterology

## 2018-02-26 NOTE — Telephone Encounter (Signed)
Patient wants to know if it's normal to have a gray bm and also states that her stomach is always sore.

## 2018-02-26 NOTE — Telephone Encounter (Signed)
The pt has appt 11/18 with Dr Ardis Hughs, she will keep this appt and discuss her concerns.  She has an extensive history and concerns with abd pain, and constipation.

## 2018-03-22 ENCOUNTER — Encounter: Payer: Self-pay | Admitting: Gastroenterology

## 2018-03-22 ENCOUNTER — Encounter

## 2018-03-22 ENCOUNTER — Ambulatory Visit: Payer: Medicare HMO | Admitting: Gastroenterology

## 2018-03-22 VITALS — BP 150/60 | HR 80 | Ht 60.0 in | Wt 129.0 lb

## 2018-03-22 DIAGNOSIS — K5909 Other constipation: Secondary | ICD-10-CM | POA: Diagnosis not present

## 2018-03-22 DIAGNOSIS — K219 Gastro-esophageal reflux disease without esophagitis: Secondary | ICD-10-CM | POA: Diagnosis not present

## 2018-03-22 MED ORDER — FAMOTIDINE 20 MG PO TABS: 20.0000 mg | ORAL_TABLET | Freq: Every day | ORAL | 11 refills | Status: DC

## 2018-03-22 NOTE — Progress Notes (Signed)
Review of gastrointestinal problems:  1. Chronic abdominal pain.Diagnosed as adhesive disease in the past by Dr. Lyla Son. CT scan September 2008 showed ventral hiatal hernia was stable. No acute findings in the abdomen or pelvis. January, 2010: repeat pains, went to emergency room, complete metabolic profile, amylase, lipase were normal. CT Scan January, 2010 with IV and oral contrast of abdomen and pelvis was normal. CT Scan December, 2010 done by ER, essentially normal. EGD January 2011: Mild gastritis, duodenitis. Biopsies showed no H. pylori. 2013 imaging: July [place] no clear cause of pain; July CT 2016  scan same; November CT 2016 scan same.  CT scan February 2019 large stool burden otherwise normal essentially. 2. Chronic gastroesophageal reflux disease.Esophagogastroduodenoscopy July 2002 showed no Barrett's esophagus. EGD October 2008 was normal, except for a 2 cm hiatal hernia.  3. History of colon polyps,colonoscopy October 2004 by Dr. Lyla Son showed no colon polyps. He wrote the indication for that examination was adenomatous polyps. I do not see any pathology reports in our chart indicating adenomatous polyps. Repeat colonoscopy 01/2008 by [facility] was normal to terminal ileum except for small hemorrhoids.  4. Status post remote cholecystectomy  5. Nausea: Winter, 2010;EGD January 2011, see above  HPI: This is a pleasant 85 year old woman whom I last saw about 5 months ago she is here with her daughter today.  Chief complaint is chronic abdominal pains, GERD, constipation  Abdominal discomfort, mostly at night while laying down.  Hurts, pounding pain, goes to her back.  Sometimes a burning in the chest, acid taste in her mouth.  Bowels move pretty well.  She takes linzess, is confused about dosing.  Was given highest dose by her primary care physician however I recently called her in medium dose  Her weight today is exactly the same as it was 5 months ago.  Same scale, here in our  office.  ROS: complete GI ROS as described in HPI, all other review negative.  Constitutional:  No unintentional weight loss   Past Medical History:  Diagnosis Date  . Diaphragmatic hernia without mention of obstruction or gangrene   . Diverticulosis of colon (without mention of hemorrhage)   . Esophageal reflux   . Irritable bowel syndrome   . Other and unspecified hyperlipidemia   . Unspecified essential hypertension   . Ventral hernia, unspecified, without mention of obstruction or gangrene     Past Surgical History:  Procedure Laterality Date  . CHOLECYSTECTOMY    . HIATAL HERNIA REPAIR      Current Outpatient Medications  Medication Sig Dispense Refill  . acetaminophen (TYLENOL) 500 MG tablet Take 500 mg by mouth every 6 (six) hours as needed for headache (pain).     Marland Kitchen aspirin EC 81 MG tablet Take 81 mg by mouth daily.    . clonazePAM (KLONOPIN) 0.5 MG tablet Take 0.5 mg by mouth at bedtime as needed for anxiety (sleep).     . colesevelam (WELCHOL) 625 MG tablet Take 625 mg by mouth daily.     Marland Kitchen docusate sodium (COLACE) 100 MG capsule Take 1 capsule (100 mg total) by mouth every 12 (twelve) hours. 60 capsule 0  . ENSURE (ENSURE) Take 237 mLs by mouth daily.    Marland Kitchen esomeprazole (NEXIUM) 40 MG capsule Take 1 capsule (40 mg total) by mouth 2 (two) times daily before a meal. 60 capsule 11  . hydrocortisone (ANUSOL-HC) 2.5 % rectal cream Apply rectally 2 times daily (Patient taking differently: Place 1 application rectally 2 (two)  times daily as needed for hemorrhoids. ) 28.35 g 0  . ipratropium (ATROVENT) 0.06 % nasal spray Place 2 sprays into both nostrils 4 (four) times daily. (Patient taking differently: Place 2 sprays into both nostrils 4 (four) times daily as needed for rhinitis. ) 15 mL 0  . linaclotide (LINZESS) 145 MCG CAPS capsule Take 1 capsule (145 mcg total) by mouth daily before breakfast. 30 capsule 11  . Multiple Vitamin (MULTIVITAMIN WITH MINERALS) TABS tablet Take  1 tablet by mouth daily.    . nitroGLYCERIN (NITROSTAT) 0.4 MG SL tablet Place 1 tablet (0.4 mg total) under the tongue every 5 (five) minutes as needed for chest pain. 20 tablet 12  . olmesartan (BENICAR) 40 MG tablet Take 40 mg by mouth every other day. Every Tuesday, Thursday, Saturday morning  1  . olmesartan-hydrochlorothiazide (BENICAR HCT) 40-12.5 MG per tablet Take 1 tablet by mouth every other day. every Sunday, Monday, Wednesday, Friday morning    . ondansetron (ZOFRAN) 4 MG tablet Take 1 tablet (4 mg total) by mouth every 8 (eight) hours as needed for nausea or vomiting. 30 tablet 1  . ondansetron (ZOFRAN-ODT) 4 MG disintegrating tablet Take 1 tablet (4 mg total) by mouth every 6 (six) hours as needed for nausea or vomiting. 20 tablet 1  . polyethylene glycol (MIRALAX / GLYCOLAX) packet Take 17 g by mouth daily.    . ranitidine (ZANTAC) 150 MG tablet Take 1 tablet (150 mg total) by mouth 2 (two) times daily. (Patient taking differently: Take 150 mg by mouth at bedtime. ) 60 tablet 0  . sodium phosphate (FLEET) 7-19 GM/118ML ENEM Place 133 mLs (1 enema total) rectally daily as needed for severe constipation. Maximum 3 consecutive days 133 mL 0  . sucralfate (CARAFATE) 1 GM/10ML suspension TAKE 10 MLS (1 G TOTAL) BY MOUTH 4 (FOUR) TIMES DAILY - WITH MEALS AND AT BEDTIME. 420 mL 11   No current facility-administered medications for this visit.     Allergies as of 03/22/2018  . (No Known Allergies)    Family History  Problem Relation Age of Onset  . Breast cancer Daughter 66  . Colon cancer Neg Hx     Social History   Socioeconomic History  . Marital status: Widowed    Spouse name: Not on file  . Number of children: 4  . Years of education: Not on file  . Highest education level: Not on file  Occupational History  . Occupation: retired  Scientific laboratory technician  . Financial resource strain: Not on file  . Food insecurity:    Worry: Not on file    Inability: Not on file  .  Transportation needs:    Medical: Not on file    Non-medical: Not on file  Tobacco Use  . Smoking status: Never Smoker  . Smokeless tobacco: Never Used  Substance and Sexual Activity  . Alcohol use: No  . Drug use: No  . Sexual activity: Not on file  Lifestyle  . Physical activity:    Days per week: Not on file    Minutes per session: Not on file  . Stress: Not on file  Relationships  . Social connections:    Talks on phone: Not on file    Gets together: Not on file    Attends religious service: Not on file    Active member of club or organization: Not on file    Attends meetings of clubs or organizations: Not on file    Relationship status:  Not on file  . Intimate partner violence:    Fear of current or ex partner: Not on file    Emotionally abused: Not on file    Physically abused: Not on file    Forced sexual activity: Not on file  Other Topics Concern  . Not on file  Social History Narrative  . Not on file     Physical Exam: Ht 5' (1.524 m) Comment: height measured without shoes  Wt 129 lb (58.5 kg)   BMI 25.19 kg/m  Constitutional: generally well-appearing Psychiatric: alert and oriented x3 Abdomen: soft, nontender, nondistended, no obvious ascites, no peritoneal signs, normal bowel sounds No peripheral edema noted in lower extremities  Assessment and plan: 84 y.o. female with GERD, chronic abdominal pains, chronic constipation  I recommended she switch from ranitidine to Pepcid given recent reports of potentially cancer causing compound in ranitidine.  I wrote her prescription for Pepcid 20 mg strength she will take 1 pill at bedtime nightly.  She is going to try the medium strength Linzess and will let me know how it works for her.  She knows to call if she has any further questions or concerns.  Please see the "Patient Instructions" section for addition details about the plan.  Owens Loffler, MD [facility] 03/22/2018, 11:05 AM

## 2018-03-22 NOTE — Patient Instructions (Addendum)
Prescription for pepcid 20mg  pill, take one pill at bedtime every night.  Disp 30 with 11 refills.  Try the lower dose linzess to see if it works as well as the higher. Call in 2-3 to weeks to report on your response.  Return as needed.  Thank you for entrusting me with your care and choosing [facility].  Dr Ardis Hughs

## 2018-03-23 ENCOUNTER — Telehealth: Payer: Self-pay | Admitting: Gastroenterology

## 2018-03-23 NOTE — Telephone Encounter (Signed)
The pt states famotidine did not help last night.  She had heart burn all night.  After questioning the pt she drank orange/pineapple juice just before bed. She was advised NOT to drink citrus drinks or eat citrus fruits.  We discussed anti reflux precautions.  She will try these recommendations and call back if she does not have relief.

## 2018-04-18 IMAGING — DX DG CHEST 1V PORT
1 series · 1 of 1 positions shown · non-contrast
Comparison: Portable exam 4337 hours compared 12/25/2014

CLINICAL DATA: Shortness of breath and chest discomfort/tightness
starting this morning, history unspecified essential hypertension

EXAM:
PORTABLE CHEST 1 VIEW

[chest ap]
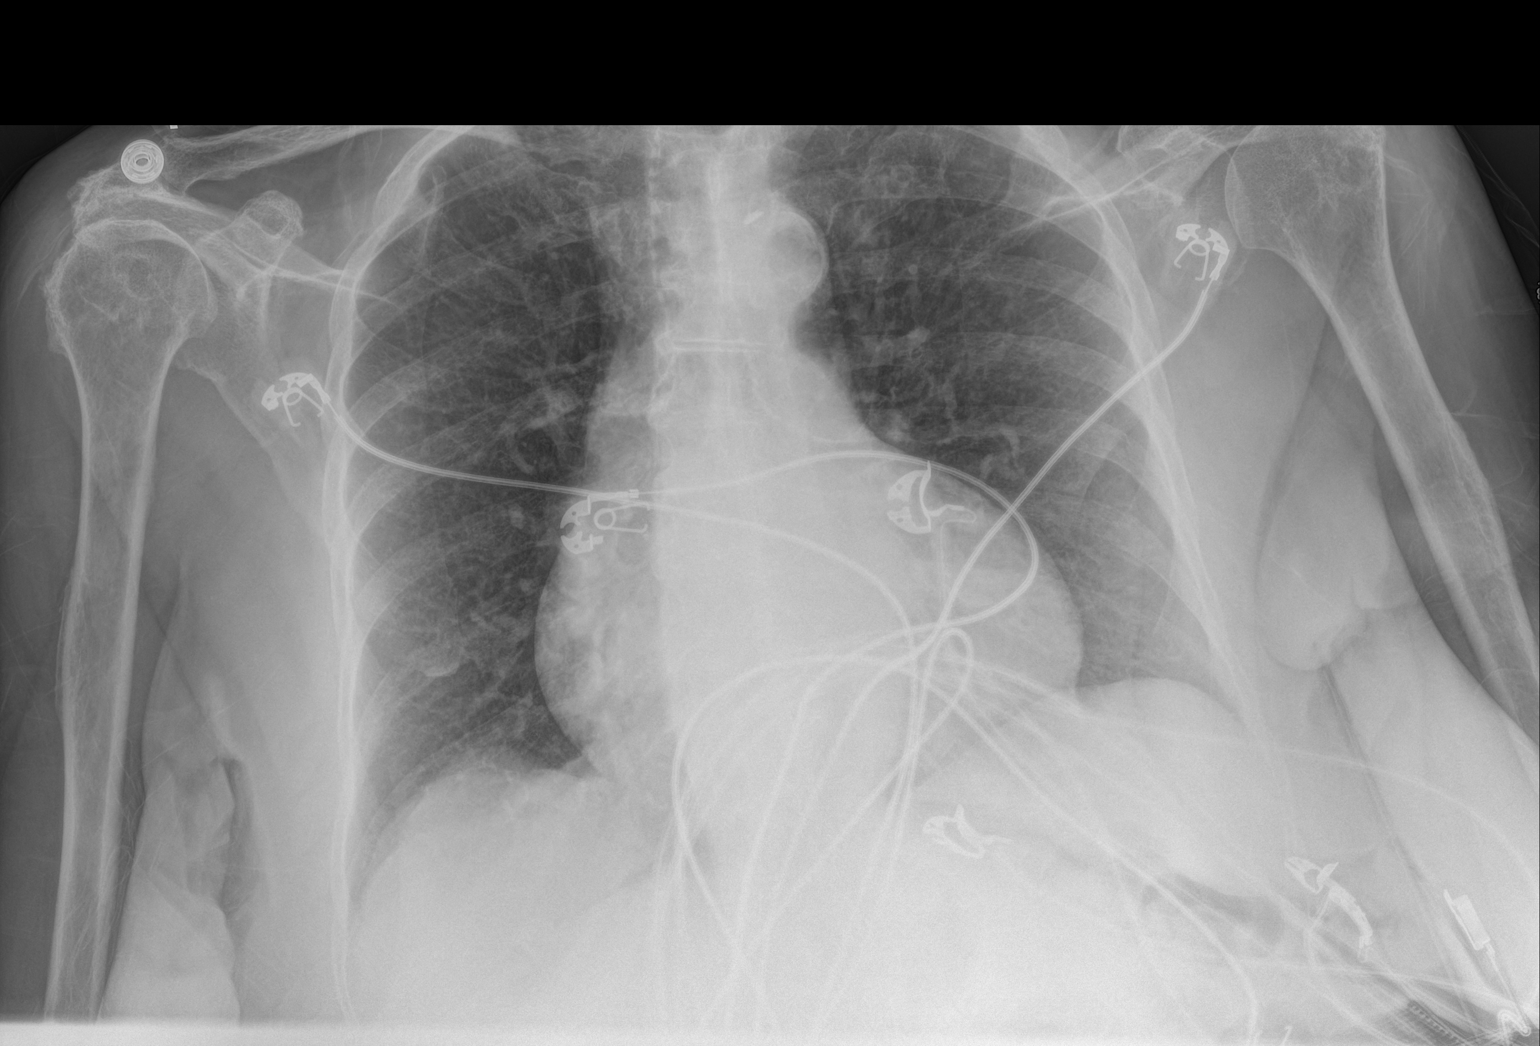

[1 of 1 positions shown; findings below may reference images not displayed]

FINDINGS: Enlargement of cardiac silhouette.

Mediastinal contours and pulmonary vascularity normal.

Atherosclerotic calcification aorta.

Lungs clear.

No pleural effusion or pneumothorax.

Bones demineralized with BILATERAL AC joint degenerative changes.
IMPRESSION: Enlargement of cardiac silhouette.

No acute abnormalities.

Aortic atherosclerosis.

## 2018-05-03 DIAGNOSIS — N390 Urinary tract infection, site not specified: Secondary | ICD-10-CM | POA: Diagnosis not present

## 2018-05-03 DIAGNOSIS — R8290 Unspecified abnormal findings in urine: Secondary | ICD-10-CM | POA: Diagnosis not present

## 2018-05-03 DIAGNOSIS — R350 Frequency of micturition: Secondary | ICD-10-CM | POA: Diagnosis not present

## 2018-05-04 ENCOUNTER — Telehealth: Payer: Self-pay | Admitting: Gastroenterology

## 2018-05-04 NOTE — Telephone Encounter (Signed)
Spoke to patient. She states she still has frequent heartburn and would like to try something else. Message sent to Dr Ardis Hughs.

## 2018-05-04 NOTE — Telephone Encounter (Signed)
Pt requests pscrp for [facility]  Pt states that famotidine is not giving any relief and she still has heartburn.

## 2018-05-06 ENCOUNTER — Other Ambulatory Visit: Payer: Self-pay

## 2018-05-06 ENCOUNTER — Telehealth: Payer: Self-pay

## 2018-05-06 MED ORDER — PANTOPRAZOLE SODIUM 40 MG PO TBEC: 40.0000 mg | DELAYED_RELEASE_TABLET | Freq: Two times a day (BID) | ORAL | 6 refills | Status: DC

## 2018-05-06 NOTE — Telephone Encounter (Signed)
-----   Message from Milus Banister, MD sent at 05/06/2018  8:56 AM EST ----- Regarding: RE: Famotidine It looks like she is already on nexium twice daily.  I'm happy to prescribe pantoprazole but it would be a replacement for her nexium, not her famotidine.  If she is interested, please call in pantoprazole 40mg  pills, one pill twice daily before meals, disp 60 with 5 refills.  Should d/c the nexium.  She should continue famotidine at bedtime (20mg  pill would be ideal for her)  Thanks ----- Message ----- From: Angie Fava, LPN Sent: 02/26/8526  12:52 PM EST To: Milus Banister, MD Subject: Famotidine                                     Dr Ardis Hughs, Patient started Famotidine at bedtime 03/22/18. She states she still has frequent heartburn despite being mindful of anti reflux precautions. She would like to try Pantoprazole. Please advise. Thanks [employer]

## 2018-05-06 NOTE — Telephone Encounter (Signed)
Spoke to patient. Informed her that Dr Ardis Hughs prescribed pantoprazole 40mg  twice  daily before meals,and continue Famotidine 20mg  at bedtime. She will stop twice daily Nexium. Patient voiced understanding.

## 2018-05-27 ENCOUNTER — Telehealth: Payer: Self-pay | Admitting: Gastroenterology

## 2018-05-27 NOTE — Telephone Encounter (Signed)
Pt called in wanting to speak with the nurse about some symptoms that she is having. Pt did not want to set an appt.

## 2018-05-27 NOTE — Telephone Encounter (Signed)
The pt had a bad episode of reflux last night.  She says she has been on medications as prescribed and has avoided foods that can worsen GERD.  She follows anti reflux precautions.  She was advised to be mindful of what she is eating especially at bedtime.  She will see if that helps and call back if no improvement.

## 2018-05-30 ENCOUNTER — Observation Stay (HOSPITAL_COMMUNITY)
Admission: EM | Admit: 2018-05-30 | Discharge: 2018-06-01 | Disposition: A | Discharge status: Home / Self Care | Payer: Medicare Other | Attending: Internal Medicine | Admitting: Internal Medicine

## 2018-05-30 ENCOUNTER — Emergency Department (HOSPITAL_COMMUNITY): Payer: Medicare Other

## 2018-05-30 ENCOUNTER — Observation Stay (HOSPITAL_BASED_OUTPATIENT_CLINIC_OR_DEPARTMENT_OTHER): Payer: Medicare Other

## 2018-05-30 ENCOUNTER — Encounter (HOSPITAL_COMMUNITY): Payer: Self-pay | Admitting: Emergency Medicine

## 2018-05-30 DIAGNOSIS — I370 Nonrheumatic pulmonary valve stenosis: Secondary | ICD-10-CM | POA: Diagnosis not present

## 2018-05-30 DIAGNOSIS — I083 Combined rheumatic disorders of mitral, aortic and tricuspid valves: Principal | ICD-10-CM | POA: Insufficient documentation

## 2018-05-30 DIAGNOSIS — R0789 Other chest pain: Secondary | ICD-10-CM | POA: Insufficient documentation

## 2018-05-30 DIAGNOSIS — I361 Nonrheumatic tricuspid (valve) insufficiency: Secondary | ICD-10-CM

## 2018-05-30 DIAGNOSIS — K219 Gastro-esophageal reflux disease without esophagitis: Secondary | ICD-10-CM | POA: Diagnosis not present

## 2018-05-30 DIAGNOSIS — R9431 Abnormal electrocardiogram [ECG] [EKG]: Secondary | ICD-10-CM | POA: Insufficient documentation

## 2018-05-30 DIAGNOSIS — R109 Unspecified abdominal pain: Secondary | ICD-10-CM

## 2018-05-30 DIAGNOSIS — M79601 Pain in right arm: Secondary | ICD-10-CM | POA: Insufficient documentation

## 2018-05-30 DIAGNOSIS — Z7982 Long term (current) use of aspirin: Secondary | ICD-10-CM | POA: Insufficient documentation

## 2018-05-30 DIAGNOSIS — Z79899 Other long term (current) drug therapy: Secondary | ICD-10-CM | POA: Diagnosis not present

## 2018-05-30 DIAGNOSIS — R079 Chest pain, unspecified: Secondary | ICD-10-CM

## 2018-05-30 DIAGNOSIS — I420 Dilated cardiomyopathy: Secondary | ICD-10-CM | POA: Diagnosis not present

## 2018-05-30 DIAGNOSIS — K760 Fatty (change of) liver, not elsewhere classified: Secondary | ICD-10-CM | POA: Diagnosis not present

## 2018-05-30 DIAGNOSIS — E782 Mixed hyperlipidemia: Secondary | ICD-10-CM | POA: Diagnosis not present

## 2018-05-30 DIAGNOSIS — E785 Hyperlipidemia, unspecified: Secondary | ICD-10-CM | POA: Diagnosis present

## 2018-05-30 DIAGNOSIS — K589 Irritable bowel syndrome without diarrhea: Secondary | ICD-10-CM | POA: Insufficient documentation

## 2018-05-30 DIAGNOSIS — K439 Ventral hernia without obstruction or gangrene: Secondary | ICD-10-CM | POA: Diagnosis not present

## 2018-05-30 DIAGNOSIS — N281 Cyst of kidney, acquired: Secondary | ICD-10-CM | POA: Diagnosis not present

## 2018-05-30 DIAGNOSIS — R931 Abnormal findings on diagnostic imaging of heart and coronary circulation: Secondary | ICD-10-CM | POA: Diagnosis not present

## 2018-05-30 DIAGNOSIS — I700 Atherosclerosis of aorta: Secondary | ICD-10-CM | POA: Insufficient documentation

## 2018-05-30 DIAGNOSIS — I10 Essential (primary) hypertension: Secondary | ICD-10-CM | POA: Diagnosis present

## 2018-05-30 DIAGNOSIS — I351 Nonrheumatic aortic (valve) insufficiency: Secondary | ICD-10-CM | POA: Diagnosis not present

## 2018-05-30 LAB — URINALYSIS, ROUTINE W REFLEX MICROSCOPIC
Bacteria, UA: NONE SEEN
Bilirubin Urine: NEGATIVE
Glucose, UA: NEGATIVE mg/dL
Ketones, ur: NEGATIVE mg/dL
Leukocytes, UA: NEGATIVE
Nitrite: NEGATIVE
PROTEIN: NEGATIVE mg/dL
Specific Gravity, Urine: 1.019 (ref 1.005–1.030)
pH: 8 (ref 5.0–8.0)

## 2018-05-30 LAB — COMPREHENSIVE METABOLIC PANEL
ALT: 14 U/L (ref 0–44)
AST: 20 U/L (ref 15–41)
Albumin: 3.6 g/dL (ref 3.5–5.0)
Alkaline Phosphatase: 52 U/L (ref 38–126)
Anion gap: 10 (ref 5–15)
BUN: 12 mg/dL (ref 8–23)
CO2: 25 mmol/L (ref 22–32)
Calcium: 9.4 mg/dL (ref 8.9–10.3)
Chloride: 105 mmol/L (ref 98–111)
Creatinine, Ser: 0.91 mg/dL (ref 0.44–1.00)
GFR calc Af Amer: >60 mL/min (ref >60)
GFR calc non Af Amer: 54 mL/min — ABNORMAL LOW (ref >60)
Glucose, Bld: 101 mg/dL — ABNORMAL HIGH (ref 70–99)
Potassium: 3.5 mmol/L (ref 3.5–5.1)
SODIUM: 140 mmol/L (ref 135–145)
Total Bilirubin: 0.7 mg/dL (ref 0.3–1.2)
Total Protein: 6.4 g/dL — ABNORMAL LOW (ref 6.5–8.1)

## 2018-05-30 LAB — I-STAT TROPONIN, ED
Troponin i, poc: 0.02 ng/mL (ref 0.00–0.08)
Troponin i, poc: 0.02 ng/mL (ref 0.00–0.08)

## 2018-05-30 LAB — CBC WITH DIFFERENTIAL/PLATELET
Abs Immature Granulocytes: 0.01 10*3/uL (ref 0.00–0.07)
BASOS ABS: 0 10*3/uL (ref 0.0–0.1)
Basophils Relative: 1 %
Eosinophils Absolute: 0.1 10*3/uL (ref 0.0–0.5)
Eosinophils Relative: 3 %
HCT: 36.7 % (ref 36.0–46.0)
Hemoglobin: 11.7 g/dL — ABNORMAL LOW (ref 12.0–15.0)
Immature Granulocytes: 0 %
Lymphocytes Relative: 29 %
Lymphs Abs: 0.8 10*3/uL (ref 0.7–4.0)
MCH: 28.7 pg (ref 26.0–34.0)
MCHC: 31.9 g/dL (ref 30.0–36.0)
MCV: 90 fL (ref 80.0–100.0)
Monocytes Absolute: 0.2 10*3/uL (ref 0.1–1.0)
Monocytes Relative: 9 %
Neutro Abs: 1.6 10*3/uL — ABNORMAL LOW (ref 1.7–7.7)
Neutrophils Relative %: 58 %
Platelets: 189 10*3/uL (ref 150–400)
RBC: 4.08 MIL/uL (ref 3.87–5.11)
RDW: 12.9 % (ref 11.5–15.5)
WBC: 2.8 10*3/uL — ABNORMAL LOW (ref 4.0–10.5)
nRBC: 0 % (ref 0.0–0.2)

## 2018-05-30 LAB — TROPONIN I
Troponin I: <0.03 ng/mL (ref <0.03)
Troponin I: 0.03 ng/mL (ref <0.03)
Troponin I: 0.03 ng/mL (ref <0.03)

## 2018-05-30 LAB — LIPASE, BLOOD: Lipase: 24 U/L (ref 11–51)

## 2018-05-30 MED ORDER — NITROGLYCERIN 0.4 MG SL SUBL
0.4000 mg | SUBLINGUAL_TABLET | SUBLINGUAL | Status: DC | PRN
  Administered 2018-06-01: 0.4 mg via SUBLINGUAL
  Filled 2018-05-30: qty 1

## 2018-05-30 MED ORDER — ENOXAPARIN SODIUM 30 MG/0.3ML ~~LOC~~ SOLN
30.0000 mg | SUBCUTANEOUS | Status: DC
  Administered 2018-05-30 – 2018-05-31 (×2): 30 mg via SUBCUTANEOUS
  Filled 2018-05-30 (×2): qty 0.3

## 2018-05-30 MED ORDER — COLESEVELAM HCL 625 MG PO TABS
625.0000 mg | ORAL_TABLET | Freq: Every day | ORAL | Status: DC
  Administered 2018-05-30 – 2018-05-31 (×2): 625 mg via ORAL
  Filled 2018-05-30 (×4): qty 1

## 2018-05-30 MED ORDER — LINACLOTIDE 145 MCG PO CAPS
145.0000 ug | ORAL_CAPSULE | Freq: Every day | ORAL | Status: DC
  Administered 2018-05-30 – 2018-05-31 (×2): 145 ug via ORAL
  Filled 2018-05-30 (×2): qty 1

## 2018-05-30 MED ORDER — ASPIRIN EC 81 MG PO TBEC
81.0000 mg | DELAYED_RELEASE_TABLET | Freq: Every day | ORAL | Status: DC
  Administered 2018-05-30 – 2018-05-31 (×2): 81 mg via ORAL
  Filled 2018-05-30 (×2): qty 1

## 2018-05-30 MED ORDER — PANTOPRAZOLE SODIUM 40 MG PO TBEC
40.0000 mg | DELAYED_RELEASE_TABLET | Freq: Two times a day (BID) | ORAL | Status: DC
  Administered 2018-05-30 – 2018-06-01 (×4): 40 mg via ORAL
  Filled 2018-05-30 (×4): qty 1

## 2018-05-30 MED ORDER — ACETAMINOPHEN 325 MG PO TABS: 650.0000 mg | ORAL_TABLET | ORAL | Status: DC | PRN

## 2018-05-30 MED ORDER — LINACLOTIDE 145 MCG PO CAPS
145.0000 ug | ORAL_CAPSULE | Freq: Every day | ORAL | Status: DC
  Filled 2018-05-30: qty 1

## 2018-05-30 MED ORDER — IRBESARTAN 300 MG PO TABS
300.0000 mg | ORAL_TABLET | Freq: Every day | ORAL | Status: DC
  Administered 2018-05-30 – 2018-05-31 (×2): 300 mg via ORAL
  Filled 2018-05-30 (×2): qty 1

## 2018-05-30 MED ORDER — IOHEXOL 300 MG/ML  SOLN
100.0000 mL | Freq: Once | INTRAMUSCULAR | Status: AC | PRN
  Administered 2018-05-30: 100 mL via INTRAVENOUS

## 2018-05-30 MED ORDER — POLYETHYLENE GLYCOL 3350 17 G PO PACK
17.0000 g | PACK | Freq: Every day | ORAL | Status: DC
  Administered 2018-05-30: 17 g via ORAL
  Filled 2018-05-30 (×3): qty 1

## 2018-05-30 MED ORDER — ADULT MULTIVITAMIN W/MINERALS CH
1.0000 | ORAL_TABLET | Freq: Every day | ORAL | Status: DC
  Administered 2018-05-30 – 2018-05-31 (×2): 1 via ORAL
  Filled 2018-05-30 (×2): qty 1

## 2018-05-30 MED ORDER — CLONAZEPAM 0.5 MG PO TABS: 0.5000 mg | ORAL_TABLET | Freq: Every evening | ORAL | Status: DC | PRN

## 2018-05-30 MED ORDER — SUCRALFATE 1 GM/10ML PO SUSP
1.0000 g | Freq: Three times a day (TID) | ORAL | Status: DC
  Administered 2018-05-30 – 2018-05-31 (×5): 1 g via ORAL
  Filled 2018-05-30 (×5): qty 10

## 2018-05-30 MED ORDER — MORPHINE SULFATE (PF) 2 MG/ML IV SOLN
2.0000 mg | INTRAVENOUS | Status: DC | PRN
  Administered 2018-05-31 – 2018-06-01 (×2): 2 mg via INTRAVENOUS
  Filled 2018-05-30 (×2): qty 1

## 2018-05-30 MED ORDER — ONDANSETRON HCL 4 MG/2ML IJ SOLN
4.0000 mg | Freq: Four times a day (QID) | INTRAMUSCULAR | Status: DC | PRN
  Administered 2018-06-01: 4 mg via INTRAVENOUS
  Filled 2018-05-30: qty 2

## 2018-05-30 MED ORDER — ENSURE ENLIVE PO LIQD
237.0000 mL | Freq: Every day | ORAL | Status: DC
  Administered 2018-05-30: 237 mL via ORAL
  Filled 2018-05-30 (×3): qty 237

## 2018-05-30 NOTE — Progress Notes (Signed)
  Echocardiogram 2D Echocardiogram has been performed.  Ann Riddle 05/30/2018, 3:05 PM

## 2018-05-30 NOTE — Progress Notes (Signed)
Patient arived on unit 5C-04

## 2018-05-30 NOTE — Plan of Care (Signed)

## 2018-05-30 NOTE — ED Triage Notes (Signed)
Meal order

## 2018-05-30 NOTE — ED Notes (Signed)
Patient transported to CT 

## 2018-05-30 NOTE — ED Notes (Signed)
Patient transported to X-ray 

## 2018-05-30 NOTE — H&P (Signed)
History and Physical    Ann Riddle [id number] DOB: [birth year] DOA: 05/30/2018  PCP: Velna Hatchet, MD  Patient coming from: home   I have personally briefly reviewed patient's old medical records available.   Chief Complaint: Chest pain on and off for 1 year.  HPI: Ann Riddle is a 85 y.o. female with medical history significant of GERD, hiatal hernia, hypertension who presents to the emergency room with left precordial chest pain on and off for 1 year.  Patient is very well-looking 85 year old Patient complains of pain usually she gets on her epigastrium, occasionally it is present on the left side of her sternum on the upper chest, burning sensation, intermittent, moderate in intensity at times, occasionally radiates to the left arm.  Last night she had severe pain, 10 out of 10 that improved with aspirin and nitroglycerin given by EMS.  She does take acid medicine but that is not helping her pain.  Patient otherwise denies any shortness of breath, palpitations, nausea, vomiting or diaphoresis.  Patient does complain of occasional abdominal pain and she also said in the ER that she had nausea.  Patient says her acid medicine did not help so she came to the ER. ED Course: Hemodynamically stable.  Slightly hypertensive.  Electrolytes are normal.  Troponin nonischemic.  Currently chest pain-free.  Chest x-ray is normal.  With concern about abdominal pain, or CT scan of the abdomen pelvis was done and that is normal.  Due to persistent and intermittent symptoms, advised evaluation for coronary syndrome disease.  Patient has already received aspirin on the way to ER.   Review of Systems: As per HPI otherwise 10 point review of systems negative.    Past Medical History:  Diagnosis Date  . Diaphragmatic hernia without mention of obstruction or gangrene   . Diverticulosis of colon (without mention of hemorrhage)   . Esophageal reflux   . Irritable bowel syndrome   . Other and unspecified  hyperlipidemia   . Unspecified essential hypertension   . Ventral hernia, unspecified, without mention of obstruction or gangrene     Past Surgical History:  Procedure Laterality Date  . CHOLECYSTECTOMY    . HIATAL HERNIA REPAIR       reports that she has never smoked. She has never used smokeless tobacco. She reports that she does not drink alcohol or use drugs.  No Known Allergies  Family History  Problem Relation Age of Onset  . Breast cancer Daughter 75  . Colon cancer Neg Hx      Prior to Admission medications   Medication Sig Start Date End Date Taking? Authorizing Provider  aspirin EC 81 MG tablet Take 81 mg by mouth daily.   Yes [provider]  clonazePAM (KLONOPIN) 0.5 MG tablet Take 0.5 mg by mouth at bedtime as needed for anxiety (sleep).    Yes [provider]  colesevelam (WELCHOL) 625 MG tablet Take 625 mg by mouth daily.    Yes [provider]  ENSURE (ENSURE) Take 237 mLs by mouth daily.   Yes [provider]  linaclotide Rolan Lipa) 145 MCG CAPS capsule Take 1 capsule (145 mcg total) by mouth daily before breakfast. 11/03/17  Yes Milus Banister, MD  Multiple Vitamin (MULTIVITAMIN WITH MINERALS) TABS tablet Take 1 tablet by mouth daily.   Yes [provider]  nitroGLYCERIN (NITROSTAT) 0.4 MG SL tablet Place 1 tablet (0.4 mg total) under the tongue every 5 (five) minutes as needed for chest pain. 11/14/15  Yes Thurnell Lose, MD  olmesartan (BENICAR) 40 MG tablet Take 40 mg by mouth every other day. Every Tuesday, Thursday, Saturday morning 04/27/17  Yes [provider]  olmesartan-hydrochlorothiazide (BENICAR HCT) 40-12.5 MG per tablet Take 1 tablet by mouth every other day. every Sunday, Monday, Wednesday, Friday morning   Yes [provider]  pantoprazole (PROTONIX) 40 MG tablet Take 1 tablet (40 mg total) by mouth 2 (two) times daily before a meal. 05/06/18  Yes Milus Banister, MD  polyethylene glycol  [facility] / GLYCOLAX) packet Take 17 g by mouth daily.   Yes [provider]  sucralfate (CARAFATE) 1 GM/10ML suspension TAKE 10 MLS (1 G TOTAL) BY MOUTH 4 (FOUR) TIMES DAILY - WITH MEALS AND AT BEDTIME. Patient taking differently: Take 1 g by mouth 4 (four) times daily -  with meals and at bedtime. . 07/31/17  Yes Levin Erp, PA    Physical Exam: Vitals:   05/30/18 1000 05/30/18 1015 05/30/18 1045 05/30/18 1100  BP: (!) 153/114 (!) 130/57 (!) 141/63 (!) 150/61  Pulse: 88 82 74 77  Resp: (!) 30 15 14  (!) 23  Temp:      TempSrc:      SpO2: 98% 97% 95% 99%    Constitutional: NAD, calm, comfortable Vitals:   05/30/18 1000 05/30/18 1015 05/30/18 1045 05/30/18 1100  BP: (!) 153/114 (!) 130/57 (!) 141/63 (!) 150/61  Pulse: 88 82 74 77  Resp: (!) 30 15 14  (!) 23  Temp:      TempSrc:      SpO2: 98% 97% 95% 99%   Eyes: PERRL, lids and conjunctivae normal ENMT: Mucous membranes are moist. Posterior pharynx clear of any exudate or lesions.Normal dentition.  Neck: normal, supple, no masses, no thyromegaly Respiratory: clear to auscultation bilaterally, no wheezing, no crackles. Normal respiratory effort. No accessory muscle use.  Cardiovascular: Regular rate and rhythm, no murmurs / rubs / gallops. No extremity edema. 2+ pedal pulses. No carotid bruits.  Patient does have musculoskeletal reproducible pain on palpation to her left side of the sternum. Abdomen: no tenderness, no masses palpated. No hepatosplenomegaly. Bowel sounds positive.  Musculoskeletal: no clubbing / cyanosis. No joint deformity upper and lower extremities. Good ROM, no contractures. Normal muscle tone.  Skin: no rashes, lesions, ulcers. No induration Neurologic: CN 2-12 grossly intact. Sensation intact, DTR normal. Strength 5/5 in all 4.  Psychiatric: Normal judgment and insight. Alert and oriented x 3. Normal mood.     Labs on Admission: I have personally reviewed following labs and imaging  studies  CBC: Recent Labs  Lab 05/30/18 0834  WBC 2.8*  NEUTROABS 1.6*  HGB 11.7*  HCT 36.7  MCV 90.0  PLT 782   Basic Metabolic Panel: Recent Labs  Lab 05/30/18 0834  NA 140  K 3.5  CL 105  CO2 25  GLUCOSE 101*  BUN 12  CREATININE 0.91  CALCIUM 9.4   GFR: CrCl cannot be calculated (Unknown ideal weight.). Liver Function Tests: Recent Labs  Lab 05/30/18 0834  AST 20  ALT 14  ALKPHOS 52  BILITOT 0.7  PROT 6.4*  ALBUMIN 3.6   Recent Labs  Lab 05/30/18 0834  LIPASE 24   No results for input(s): AMMONIA in the last 168 hours. Coagulation Profile: No results for input(s): INR, PROTIME in the last 168 hours. Cardiac Enzymes: No results for input(s): CKTOTAL, CKMB, CKMBINDEX, TROPONINI in the last 168 hours. BNP (last 3 results) No results for input(s): PROBNP in  the last 8760 hours. HbA1C: No results for input(s): HGBA1C in the last 72 hours. CBG: No results for input(s): GLUCAP in the last 168 hours. Lipid Profile: No results for input(s): CHOL, HDL, LDLCALC, TRIG, CHOLHDL, LDLDIRECT in the last 72 hours. Thyroid Function Tests: No results for input(s): TSH, T4TOTAL, FREET4, T3FREE, THYROIDAB in the last 72 hours. Anemia Panel: No results for input(s): VITAMINB12, FOLATE, FERRITIN, TIBC, IRON, RETICCTPCT in the last 72 hours. Urine analysis:    Component Value Date/Time   COLORURINE STRAW (A) 06/15/2017 1420   APPEARANCEUR CLEAR 06/15/2017 1420   LABSPEC 1.005 06/15/2017 1420   PHURINE 7.0 06/15/2017 1420   GLUCOSEU NEGATIVE 06/15/2017 1420   HGBUR MODERATE (A) 06/15/2017 1420   BILIRUBINUR NEGATIVE 06/15/2017 1420   KETONESUR NEGATIVE 06/15/2017 1420   PROTEINUR NEGATIVE 06/15/2017 1420   UROBILINOGEN 0.2 12/25/2014 2036   NITRITE NEGATIVE 06/15/2017 1420   LEUKOCYTESUR NEGATIVE 06/15/2017 1420    Radiological Exams on Admission: Dg Chest 2 View  Result Date: 05/30/2018 CLINICAL DATA:  Chest pain EXAM: CHEST - 2 VIEW COMPARISON:  Chest  x-ray dated 02/10/2018. FINDINGS: Heart size and mediastinal contours are stable. Atherosclerotic changes again noted at the aortic arch. Lungs are clear. No pleural effusion or pneumothorax seen. No acute or suspicious osseous finding. IMPRESSION: 1. No active cardiopulmonary disease. No evidence of pneumonia or pulmonary edema. 2. Aortic atherosclerosis. Electronically Signed   By: Franki Cabot M.D.   On: 05/30/2018 08:28   Ct Abdomen Pelvis W Contrast  Result Date: 05/30/2018 CLINICAL DATA:  Abdominal pain and diarrhea. EXAM: CT ABDOMEN AND PELVIS WITH CONTRAST TECHNIQUE: Multidetector CT imaging of the abdomen and pelvis was performed using the standard protocol following bolus administration of intravenous contrast. CONTRAST:  139mL OMNIPAQUE IOHEXOL 300 MG/ML  SOLN COMPARISON:  06/15/2017. FINDINGS: Lower chest: Minimal bilateral dependent atelectasis. Hepatobiliary: Mild diffuse low density of the liver relative to the spleen. Cholecystectomy clips. Stable intrahepatic biliary ductal dilatation, within normal limits for the age of the patient, following cholecystectomy. Pancreas: Large duodenal diverticulum in the head of the pancreas. The remainder the pancreas is unremarkable. Spleen: Normal in size without focal abnormality. Adrenals/Urinary Tract: Normal appearing adrenal glands. Left renal cysts. Tiny right renal cyst. Unremarkable urinary bladder and ureters. Stomach/Bowel: Stomach is within normal limits. Appendix appears normal. No evidence of bowel wall thickening, distention, or inflammatory changes. Vascular/Lymphatic: Atheromatous arterial calcifications without aneurysm. No enlarged lymph nodes. Reproductive: Uterus and bilateral adnexa are unremarkable. Other: Small ventral hernia containing fat above the level of the umbilicus. Musculoskeletal: Lumbar and lower thoracic spine degenerative changes. These include extensive facet degenerative changes with associated grade 1 anterolisthesis at  the L4-5 level. IMPRESSION: 1. No acute abnormality. 2. Mild diffuse hepatic steatosis. 3. Stable small supraumbilical ventral hernia containing fat. Electronically Signed   By: Claudie Revering M.D.   On: 05/30/2018 10:31    EKG: Independently reviewed.  Sinus rhythm, no acute ST-T wave changes.  Comparable to previous EKG.  Assessment/Plan Principal Problem:   Chest pain Active Problems:   Hyperlipidemia   Essential hypertension   GERD   Chest pain:  We will admit patient to the telemetry unit given severity of symptoms. Currently chest pain improved Cycle EKG and troponins. Supplemental oxygen to keep saturations more than 90%. Aspirin received at home, resume starting tomorrow morning. Nitroglycerin if recurrent chest pain. Morphine for severe and recurrent pain. Therapeutic anticoagulation because of no ongoing chest pain. Patient is willing to undergo cardiac stress test, will  order chemical stress test with echocardiogram in the morning as her baseline EKG is fairly normal.  Hypertension: Fairly controlled.  Continue home medications.  Hyperlipidemia: She is on a statin that she will continue.  GERD: With significant symptoms.  She is on Protonix twice a day that she will continue.  We discussed about reflux symptoms and management.   DVT prophylaxis: Lovenox. Code Status: Full code. Family Communication: No family bedside. Disposition Plan: Home. Consults called: None. Admission status: Observation.  Cardiac telemetry.   Barb Merino MD [facility] Pager [phone]  If 7PM-7AM, please contact night-coverage [email or web address] Password [facility]  05/30/2018, 11:44 AM

## 2018-05-30 NOTE — ED Provider Notes (Signed)
[facility] EMERGENCY DEPARTMENT Provider Note   CSN: [id number] Arrival date & time: 05/30/18  8546     History   Chief Complaint Chief Complaint  Patient presents with  . Chest Pain    HPI Ann Riddle is a 85 y.o. female.  HPI  Presents with one year of intermittent left sided chest pain and epigastric burning sensation. Worsened last night.  Took medications for it, not sure what type-carafate. Didn't improve pain.  Current episode left sided chest burning that radiates to left arm. Last night 10/10, now 4/10, improved with aspirin and nitro with EMS. No dyspnea, no palpitations, nausea, vomiting or diaphoresis prior to arrival, but now resports abdominal pain and nausea. Reports she had soreness before but now has pain, points to both top and lower part of the abdomen. Nothing makes it better or worse. No recent illness, no leg swelling.  No recent long immobilizations or surgeries.  No hx of stress test, no cardiologist.  No exertional symptoms.  Past Medical History:  Diagnosis Date  . Diaphragmatic hernia without mention of obstruction or gangrene   . Diverticulosis of colon (without mention of hemorrhage)   . Esophageal reflux   . Irritable bowel syndrome   . Other and unspecified hyperlipidemia   . Unspecified essential hypertension   . Ventral hernia, unspecified, without mention of obstruction or gangrene     Patient Active Problem List   Diagnosis Date Noted  . Chest pain 11/13/2015  . Constipation 11/10/2007  . HYPERLIPIDEMIA 08/04/2007  . Essential hypertension 08/04/2007  . GERD 08/04/2007  . IBS 08/04/2007    Past Surgical History:  Procedure Laterality Date  . CHOLECYSTECTOMY    . HIATAL HERNIA REPAIR       OB History   No obstetric history on file.      Home Medications    Prior to Admission medications   Medication Sig Start Date End Date Taking? Authorizing Provider  acetaminophen (TYLENOL) 500 MG tablet Take 500 mg  by mouth daily as needed for headache (pain).  02/14/17  Yes [provider]  aspirin EC 81 MG tablet Take 81 mg by mouth daily.   Yes [provider]  clonazePAM (KLONOPIN) 0.5 MG tablet Take 0.5 mg by mouth at bedtime as needed for anxiety (sleep).    Yes [provider]  colesevelam (WELCHOL) 625 MG tablet Take 625 mg by mouth daily.    Yes [provider]  ENSURE (ENSURE) Take 237 mLs by mouth daily.   Yes [provider]  linaclotide Rolan Lipa) 145 MCG CAPS capsule Take 1 capsule (145 mcg total) by mouth daily before breakfast. 11/03/17  Yes Milus Banister, MD  Multiple Vitamin (MULTIVITAMIN WITH MINERALS) TABS tablet Take 1 tablet by mouth daily.   Yes [provider]  nitroGLYCERIN (NITROSTAT) 0.4 MG SL tablet Place 1 tablet (0.4 mg total) under the tongue every 5 (five) minutes as needed for chest pain. 11/14/15  Yes Thurnell Lose, MD  olmesartan (BENICAR) 40 MG tablet Take 40 mg by mouth every other day. Every Tuesday, Thursday, Saturday morning 04/27/17  Yes [provider]  olmesartan-hydrochlorothiazide (BENICAR HCT) 40-12.5 MG per tablet Take 1 tablet by mouth every other day. every Sunday, Monday, Wednesday, Friday morning   Yes [provider]  pantoprazole (PROTONIX) 40 MG tablet Take 1 tablet (40 mg total) by mouth 2 (two) times daily before a meal. 05/06/18  Yes Milus Banister, MD  polyethylene glycol [facility] /  GLYCOLAX) packet Take 17 g by mouth daily.   Yes [provider]  sucralfate (CARAFATE) 1 GM/10ML suspension TAKE 10 MLS (1 G TOTAL) BY MOUTH 4 (FOUR) TIMES DAILY - WITH MEALS AND AT BEDTIME. Patient taking differently: Take 1 g by mouth 4 (four) times daily -  with meals and at bedtime. . 07/31/17  Yes Levin Erp, PA  docusate sodium (COLACE) 100 MG capsule Take 1 capsule (100 mg total) by mouth every 12 (twelve) hours. Patient not taking: Reported on 05/30/2018 08/18/15   Melony Overly, MD  famotidine (PEPCID) 20 MG tablet Take 1 tablet (20 mg total) by mouth at bedtime. Patient not taking: Reported on 05/30/2018 03/22/18   Milus Banister, MD  hydrocortisone (ANUSOL-HC) 2.5 % rectal cream Apply rectally 2 times daily Patient not taking: Reported on 05/30/2018 08/18/15   Melony Overly, MD  ipratropium (ATROVENT) 0.06 % nasal spray Place 2 sprays into both nostrils 4 (four) times daily. Patient not taking: Reported on 05/30/2018 01/01/17   Lysbeth Penner, FNP  ondansetron (ZOFRAN) 4 MG tablet Take 1 tablet (4 mg total) by mouth every 8 (eight) hours as needed for nausea or vomiting. Patient not taking: Reported on 05/30/2018 11/27/17   Zehr, Laban Emperor, PA-C  ranitidine (ZANTAC) 150 MG tablet Take 1 tablet (150 mg total) by mouth 2 (two) times daily. Patient not taking: Reported on 05/30/2018 01/06/17   Muthersbaugh, Jarrett Soho, PA-C    Family History Family History  Problem Relation Age of Onset  . Breast cancer Daughter 68  . Colon cancer Neg Hx     Social History Social History   Tobacco Use  . Smoking status: Never Smoker  . Smokeless tobacco: Never Used  Substance Use Topics  . Alcohol use: No  . Drug use: No     Allergies   Patient has no known allergies.   Review of Systems Review of Systems  Constitutional: Negative for fever.  HENT: Negative for sore throat.   Eyes: Negative for visual disturbance.  Respiratory: Negative for cough and shortness of breath.   Cardiovascular: Positive for chest pain. Negative for leg swelling.  Gastrointestinal: Positive for abdominal pain and nausea. Negative for vomiting.  Genitourinary: Negative for difficulty urinating.  Musculoskeletal: Negative for back pain and neck pain.  Skin: Negative for rash.  Neurological: Negative for syncope and headaches.     Physical Exam Updated Vital Signs BP (!) 142/72   Pulse 68   Temp 98.4 F (36.9 C) (Oral)   Resp 20   SpO2 98%   Physical Exam Vitals signs and nursing  note reviewed.  Constitutional:      General: She is not in acute distress.    Appearance: She is well-developed. She is not diaphoretic.  HENT:     Head: Normocephalic and atraumatic.  Eyes:     Conjunctiva/sclera: Conjunctivae normal.  Neck:     Musculoskeletal: Normal range of motion.  Cardiovascular:     Rate and Rhythm: Normal rate and regular rhythm.     Heart sounds: Normal heart sounds. No murmur. No friction rub. No gallop.   Pulmonary:     Effort: Pulmonary effort is normal. No respiratory distress.     Breath sounds: Normal breath sounds. No wheezing or rales.  Abdominal:     General: There is no distension.     Palpations: Abdomen is soft.     Tenderness: There is abdominal tenderness (suprapubic, LLQ and epigastrium). There is no guarding.  Musculoskeletal:        General: No tenderness.     Right lower leg: Edema (trace) present.     Left lower leg: Edema (trace) present.  Skin:    General: Skin is warm and dry.     Findings: No erythema or rash.  Neurological:     Mental Status: She is alert and oriented to person, place, and time.      ED Treatments / Results  Labs (all labs ordered are listed, but only abnormal results are displayed) Labs Reviewed  CBC WITH DIFFERENTIAL/PLATELET - Abnormal; Notable for the following components:      Result Value   WBC 2.8 (*)    Hemoglobin 11.7 (*)    Neutro Abs 1.6 (*)    All other components within normal limits  COMPREHENSIVE METABOLIC PANEL - Abnormal; Notable for the following components:   Glucose, Bld 101 (*)    Total Protein 6.4 (*)    GFR calc non Af Amer 54 (*)    All other components within normal limits  URINE CULTURE  LIPASE, BLOOD  URINALYSIS, ROUTINE W REFLEX MICROSCOPIC  I-STAT TROPONIN, ED  I-STAT TROPONIN, ED    EKG EKG Interpretation  Date/Time:  Sunday May 30 2018 07:43:17 EST Ventricular Rate:  74 PR Interval:    QRS Duration: 68 QT Interval:  396 QTC Calculation: 440 R  Axis:   31 Text Interpretation:  Sinus rhythm Baseline wander in lead(s) V2 Artifact No significant change since last tracing Confirmed by Gareth Morgan [phone]) on 05/30/2018 10:11:20 AM   Radiology Dg Chest 2 View  Result Date: 05/30/2018 CLINICAL DATA:  Chest pain EXAM: CHEST - 2 VIEW COMPARISON:  Chest x-ray dated 02/10/2018. FINDINGS: Heart size and mediastinal contours are stable. Atherosclerotic changes again noted at the aortic arch. Lungs are clear. No pleural effusion or pneumothorax seen. No acute or suspicious osseous finding. IMPRESSION: 1. No active cardiopulmonary disease. No evidence of pneumonia or pulmonary edema. 2. Aortic atherosclerosis. Electronically Signed   By: Franki Cabot M.D.   On: 05/30/2018 08:28   Ct Abdomen Pelvis W Contrast  Result Date: 05/30/2018 CLINICAL DATA:  Abdominal pain and diarrhea. EXAM: CT ABDOMEN AND PELVIS WITH CONTRAST TECHNIQUE: Multidetector CT imaging of the abdomen and pelvis was performed using the standard protocol following bolus administration of intravenous contrast. CONTRAST:  184mL OMNIPAQUE IOHEXOL 300 MG/ML  SOLN COMPARISON:  06/15/2017. FINDINGS: Lower chest: Minimal bilateral dependent atelectasis. Hepatobiliary: Mild diffuse low density of the liver relative to the spleen. Cholecystectomy clips. Stable intrahepatic biliary ductal dilatation, within normal limits for the age of the patient, following cholecystectomy. Pancreas: Large duodenal diverticulum in the head of the pancreas. The remainder the pancreas is unremarkable. Spleen: Normal in size without focal abnormality. Adrenals/Urinary Tract: Normal appearing adrenal glands. Left renal cysts. Tiny right renal cyst. Unremarkable urinary bladder and ureters. Stomach/Bowel: Stomach is within normal limits. Appendix appears normal. No evidence of bowel wall thickening, distention, or inflammatory changes. Vascular/Lymphatic: Atheromatous arterial calcifications without aneurysm. No  enlarged lymph nodes. Reproductive: Uterus and bilateral adnexa are unremarkable. Other: Small ventral hernia containing fat above the level of the umbilicus. Musculoskeletal: Lumbar and lower thoracic spine degenerative changes. These include extensive facet degenerative changes with associated grade 1 anterolisthesis at the L4-5 level. IMPRESSION: 1. No acute abnormality. 2. Mild diffuse hepatic steatosis. 3. Stable small supraumbilical ventral hernia containing fat. Electronically Signed   By: Claudie Revering M.D.   On: 05/30/2018 10:31    Procedures Procedures (  including critical care time)  Medications Ordered in ED Medications  iohexol (OMNIPAQUE) 300 MG/ML solution 100 mL (100 mLs Intravenous Contrast Given 05/30/18 1005)     Initial Impression / Assessment and Plan / ED Course  I have reviewed the triage vital signs and the nursing notes.  Pertinent labs & imaging results that were available during my care of the patient were reviewed by me and considered in my medical decision making (see chart for details).     85yo female with history of hypertension, hyperlipidemia who is presenting with chest pain.  Differential diagnosis for chest pain includes pulmonary embolus, dissection, pneumothorax, pneumonia, ACS, myocarditis, pericarditis.  EKG was done and evaluate by me and showed no acute ST changes and no signs of pericarditis. Chest x-ray was done and evaluated by me and radiology and showed no sign of pneumonia or pneumothorax.  Normal bilateral pulses, doubt dissection.  No dyspnea, no tachycardia, doubt PE.  Pt also reports some abdominal discomfort that has worsened in the ED, noted to have chronic abdominal pain but does have tenderness in lower abdomen and will CT to evaluate for diverticulitis or other. CT shows no acute abnormalities. UA pending.   Troponin negative.  Patient is high risk HEAR score given age, risk factors, concerning historical components including radiation of  pain down left arm and improvement with nitro. Feel she is appropriate for inpatient observation of chest pain and consideration of further testing.   Final Clinical Impressions(s) / ED Diagnoses   Final diagnoses:  Chest pain, unspecified type  Abdominal pain, unspecified abdominal location    ED Discharge Orders    None       Gareth Morgan, MD 05/30/18 1145

## 2018-05-30 NOTE — ED Triage Notes (Signed)
Pt arrives via EMS from home with reports of CP for 24 hours. Pain down left arm and back. 1 nitro given with pressure dropped to 130/70, 324 ASA given with relief.

## 2018-05-31 ENCOUNTER — Other Ambulatory Visit: Payer: Self-pay

## 2018-05-31 ENCOUNTER — Encounter (HOSPITAL_COMMUNITY): Payer: Self-pay | Admitting: Internal Medicine

## 2018-05-31 ENCOUNTER — Observation Stay (HOSPITAL_BASED_OUTPATIENT_CLINIC_OR_DEPARTMENT_OTHER): Payer: Medicare Other

## 2018-05-31 DIAGNOSIS — E782 Mixed hyperlipidemia: Secondary | ICD-10-CM | POA: Diagnosis not present

## 2018-05-31 DIAGNOSIS — R079 Chest pain, unspecified: Secondary | ICD-10-CM | POA: Diagnosis not present

## 2018-05-31 DIAGNOSIS — I10 Essential (primary) hypertension: Secondary | ICD-10-CM

## 2018-05-31 DIAGNOSIS — R0789 Other chest pain: Secondary | ICD-10-CM | POA: Diagnosis not present

## 2018-05-31 LAB — URINE CULTURE: Culture: <10000 — AB

## 2018-05-31 LAB — NM MYOCAR MULTI W/SPECT W/WALL MOTION / EF
CSEPPHR: 100 {beats}/min
Estimated workload: 1 METS
Exercise duration (min): 5 min
MPHR: 126 {beats}/min
Percent HR: 79 %
Rest HR: 73 {beats}/min

## 2018-05-31 MED ORDER — ASPIRIN 81 MG PO CHEW
81.0000 mg | CHEWABLE_TABLET | ORAL | Status: AC
  Administered 2018-06-01: 81 mg via ORAL
  Filled 2018-05-31: qty 1

## 2018-05-31 MED ORDER — SODIUM CHLORIDE 0.9 % WEIGHT BASED INFUSION
1.0000 mL/kg/h | INTRAVENOUS | Status: DC
  Administered 2018-06-01: 1 mL/kg/h via INTRAVENOUS

## 2018-05-31 MED ORDER — SODIUM CHLORIDE 0.9% FLUSH
3.0000 mL | Freq: Two times a day (BID) | INTRAVENOUS | Status: DC
  Administered 2018-05-31 – 2018-06-01 (×2): 3 mL via INTRAVENOUS

## 2018-05-31 MED ORDER — ASPIRIN EC 81 MG PO TBEC: 81.0000 mg | DELAYED_RELEASE_TABLET | Freq: Every day | ORAL | Status: DC

## 2018-05-31 MED ORDER — REGADENOSON 0.4 MG/5ML IV SOLN
INTRAVENOUS | Status: AC
  Filled 2018-05-31: qty 5

## 2018-05-31 MED ORDER — TECHNETIUM TC 99M TETROFOSMIN IV KIT
10.0000 | PACK | Freq: Once | INTRAVENOUS | Status: AC | PRN
  Administered 2018-05-31: 10 via INTRAVENOUS

## 2018-05-31 MED ORDER — TECHNETIUM TC 99M TETROFOSMIN IV KIT
30.0000 | PACK | Freq: Once | INTRAVENOUS | Status: AC | PRN
  Administered 2018-05-31: 30 via INTRAVENOUS

## 2018-05-31 MED ORDER — REGADENOSON 0.4 MG/5ML IV SOLN
0.4000 mg | Freq: Once | INTRAVENOUS | Status: AC
  Administered 2018-05-31: 0.4 mg via INTRAVENOUS
  Filled 2018-05-31: qty 5

## 2018-05-31 MED ORDER — SODIUM CHLORIDE 0.9 % WEIGHT BASED INFUSION
3.0000 mL/kg/h | INTRAVENOUS | Status: DC
  Administered 2018-06-01: 3 mL/kg/h via INTRAVENOUS

## 2018-05-31 MED ORDER — SODIUM CHLORIDE 0.9 % IV SOLN: 250.0000 mL | INTRAVENOUS | Status: DC | PRN

## 2018-05-31 MED ORDER — SODIUM CHLORIDE 0.9% FLUSH: 3.0000 mL | INTRAVENOUS | Status: DC | PRN

## 2018-05-31 NOTE — Progress Notes (Signed)
[facility] PROGRESS NOTE  Ann Riddle [id number] DOB: 05-13-1921 DOA: 05/30/2018 PCP: Velna Hatchet, MD  Assessment/Plan:  Chest pain: pain fee on admission. Troponins negative. ekg with no acute changes, echo with EF 65-70%, moderate LVH and grade 1 DD. No events on tele. Not hypoxic. Chest xray without acute process. CT abdomen without acute abnormality.  Provided with aspirin. Evaluated by cardiology who recommended stress test that was high risk. Recommended cardiac cath -npo past midnight -supportive therapy  Hypertension: Fair control.  -continue homemed  Hyperlipidemia: She is on a statin that she will continue.  GERD: With significant symptoms. She is on Protonix twice a day that she will continue. OP follow up   Code Status: full Family Communication: none present Disposition Plan: to be determined   Consultants:  [facility] cardiology  Procedures:  Stress test  Antibiotics:    HPI/Subjective: 85 yo female with atypical chest pain and negative troponins. Hx gerd. No hx chest pain or ischemic work up. Evaluated by cards who recommended chemical stress test  Patient denies chest pain/discomfort this am. Ambulating in room with steady gait  Objective: Vitals:   05/31/18 1306 05/31/18 1458  BP: (!) 167/58 (!) 160/68  Pulse: 87 77  Resp:  16  Temp:  98 F (36.7 C)  SpO2:  100%    Intake/Output Summary (Last 24 hours) at 05/31/2018 1636 Last data filed at 05/31/2018 0900 Gross per 24 hour  Intake 480 ml  Output -  Net 480 ml   Filed Weights   05/31/18 1049  Weight: 58.5 kg    Exam:   General:  Awake alert oriented x3  Cardiovascular: rrr no mgr no LE edema  Respiratory: normal effort BS clear bilaterally no wheeze  Abdomen: non-distended non-tender +BS no guarding or rebounding  Musculoskeletal: joints without swelling/erythema   Data Reviewed: Basic Metabolic Panel: Recent Labs  Lab 05/30/18 0834  NA 140  K 3.5  CL  105  CO2 25  GLUCOSE 101*  BUN 12  CREATININE 0.91  CALCIUM 9.4   Liver Function Tests: Recent Labs  Lab 05/30/18 0834  AST 20  ALT 14  ALKPHOS 52  BILITOT 0.7  PROT 6.4*  ALBUMIN 3.6   Recent Labs  Lab 05/30/18 0834  LIPASE 24   No results for input(s): AMMONIA in the last 168 hours. CBC: Recent Labs  Lab 05/30/18 0834  WBC 2.8*  NEUTROABS 1.6*  HGB 11.7*  HCT 36.7  MCV 90.0  PLT 189   Cardiac Enzymes: Recent Labs  Lab 05/30/18 1127 05/30/18 1659 05/30/18 2312  TROPONINI <0.03 0.03* 0.03*   BNP (last 3 results) No results for input(s): BNP in the last 8760 hours.  ProBNP (last 3 results) No results for input(s): PROBNP in the last 8760 hours.  CBG: No results for input(s): GLUCAP in the last 168 hours.  Recent Results (from the past 240 hour(s))  Urine culture     Status: Abnormal   Collection Time: 05/30/18 11:30 AM  Result Value Ref Range Status   Specimen Description URINE, RANDOM  Final   Special Requests NONE  Final   Culture (A)  Final    <10,000 COLONIES/mL INSIGNIFICANT GROWTH Performed at [facility], [address] [place], [place] [address]    Report Status 05/31/2018 FINAL  Final     Studies: Dg Chest 2 View  Result Date: 05/30/2018 CLINICAL DATA:  Chest pain EXAM: CHEST - 2 VIEW COMPARISON:  Chest x-ray dated 02/10/2018. FINDINGS: Heart  size and mediastinal contours are stable. Atherosclerotic changes again noted at the aortic arch. Lungs are clear. No pleural effusion or pneumothorax seen. No acute or suspicious osseous finding. IMPRESSION: 1. No active cardiopulmonary disease. No evidence of pneumonia or pulmonary edema. 2. Aortic atherosclerosis. Electronically Signed   By: Franki Cabot M.D.   On: 05/30/2018 08:28   Ct Abdomen Pelvis W Contrast  Result Date: 05/30/2018 CLINICAL DATA:  Abdominal pain and diarrhea. EXAM: CT ABDOMEN AND PELVIS WITH CONTRAST TECHNIQUE: Multidetector CT imaging of the abdomen and pelvis was  performed using the standard protocol following bolus administration of intravenous contrast. CONTRAST:  151mL OMNIPAQUE IOHEXOL 300 MG/ML  SOLN COMPARISON:  06/15/2017. FINDINGS: Lower chest: Minimal bilateral dependent atelectasis. Hepatobiliary: Mild diffuse low density of the liver relative to the spleen. Cholecystectomy clips. Stable intrahepatic biliary ductal dilatation, within normal limits for the age of the patient, following cholecystectomy. Pancreas: Large duodenal diverticulum in the head of the pancreas. The remainder the pancreas is unremarkable. Spleen: Normal in size without focal abnormality. Adrenals/Urinary Tract: Normal appearing adrenal glands. Left renal cysts. Tiny right renal cyst. Unremarkable urinary bladder and ureters. Stomach/Bowel: Stomach is within normal limits. Appendix appears normal. No evidence of bowel wall thickening, distention, or inflammatory changes. Vascular/Lymphatic: Atheromatous arterial calcifications without aneurysm. No enlarged lymph nodes. Reproductive: Uterus and bilateral adnexa are unremarkable. Other: Small ventral hernia containing fat above the level of the umbilicus. Musculoskeletal: Lumbar and lower thoracic spine degenerative changes. These include extensive facet degenerative changes with associated grade 1 anterolisthesis at the L4-5 level. IMPRESSION: 1. No acute abnormality. 2. Mild diffuse hepatic steatosis. 3. Stable small supraumbilical ventral hernia containing fat. Electronically Signed   By: Claudie Revering M.D.   On: 05/30/2018 10:31   Nm Myocar Multi W/spect W/wall Motion / Ef  Result Date: 05/31/2018 CLINICAL DATA:  85 year old with possible acute coronary syndrome. Negative troponin. EXAM: MYOCARDIAL IMAGING WITH SPECT (REST AND PHARMACOLOGIC-STRESS) GATED LEFT VENTRICULAR WALL MOTION STUDY LEFT VENTRICULAR EJECTION FRACTION TECHNIQUE: Standard myocardial SPECT imaging was performed after resting intravenous injection of 10 mCi Tc-3m  tetrofosmin. Subsequently, intravenous infusion of Lexiscan was performed under the supervision of the Cardiology staff. At peak effect of the drug, 30 mCi Tc-50m tetrofosmin was injected intravenously and standard myocardial SPECT imaging was performed. Quantitative gated imaging was also performed to evaluate left ventricular wall motion, and estimate left ventricular ejection fraction. COMPARISON:  Chest radiograph 05/30/2018 FINDINGS: Perfusion: Decreased activity along the inferior wall on both the rest and stress images. Mild reversibility along the apical segment of the inferior wall. No other areas are concerning for reversibility or ischemia. Wall Motion: Global hypokinesia and abnormal wall motion. Difficult to assess for individual wall dyskinesia. Left Ventricular Ejection Fraction: 17 % End diastolic volume 39 ml End systolic volume 32 ml IMPRESSION: 1. Concern for mild reversibility along the inferior wall in the apical segment. No other areas are concerning for reversibility. 2. Global hypokinesis and difficult to assess for wall dyskinesia. 3. Left ventricular ejection fraction 17% 4. Non invasive risk stratification*: High *2012 Appropriate Use Criteria for Coronary Revascularization Focused Update: J Am Coll Cardiol. 2947;65(4):650-354. http://[email or web address] Electronically Signed   By: Markus Daft M.D.   On: 05/31/2018 15:04    Scheduled Meds: . aspirin EC  81 mg Oral Daily  . colesevelam  625 mg Oral Daily  . enoxaparin (LOVENOX) injection  30 mg Subcutaneous Q24H  . feeding supplement (ENSURE ENLIVE)  237 mL Oral Daily  . irbesartan  300  mg Oral Daily  . linaclotide  145 mcg Oral QAC breakfast  . multivitamin with minerals  1 tablet Oral Daily  . pantoprazole  40 mg Oral BID AC  . polyethylene glycol  17 g Oral Daily  . regadenoson      . sucralfate  1 g Oral TID WC & HS   Continuous Infusions:  Principal Problem:   Chest pain Active  Problems:   Hyperlipidemia   Essential hypertension   GERD    Time spent: 17 minute    [place] NP  [facility]  If 7PM-7AM, please contact night-coverage at [email or web address], password [facility] 05/31/2018, 4:36 PM  LOS: 0 days

## 2018-05-31 NOTE — Discharge Summary (Signed)
Physician Discharge Summary  Ann Riddle [id number] DOB: 1921/08/29 DOA: 05/30/2018  PCP: Velna Hatchet, MD  Admit date: 05/30/2018 Discharge date: 06/01/2018  Time spent: 45 minutes  Recommendations for Outpatient Follow-up:  1. Follow up with PCP 1-2 weeks for evaluation of symptoms     Discharge Diagnoses:  Principal Problem:   Chest pain Active Problems:   Hyperlipidemia   Essential hypertension   GERD   Abnormal nuclear cardiac imaging test   Discharge Condition: stable  Diet recommendation: heart healthy   Filed Weights   05/31/18 1049  Weight: 58.5 kg    History of present illness:  85 yo female with atypical chest pain and negative troponins. Hx gerd. No hx chest pain or ischemic work up. Evaluated by cards who recommended chemical stress test  Hospital Course:  Chest pain: pain fee on admission. Troponins negative. ekg with no acute changes, echo with EF 65-70%, moderate LVH and grade 1 DD. No events on tele. Not hypoxic. Chest xray without acute process. Provided with aspirin. Evaluated by cardiology who recommended stress test which was high risk. Cath done 06/01/18 revealing Right dominant coronary artery with minimal nonobstructive disease.  Patent left main stem, LAD, and left circumflex with minor luminal irregularities.  Calcified mitral annulus Suspect noncardiac chest pain.  The coronary arteries are widely patent with no significant stenosis  Hypertension: Fair control.    Hyperlipidemia: She is on a statin that she will continue.  GERD: With significant symptoms.  She is on Protonix twice a day that she will continue. OP follow up    Procedures:  Echo  Stress test  Cardiac cath  Consultations:  [facility] cardiology  [facility] cardiology  Discharge Exam: Vitals:   06/01/18 1145 06/01/18 1200  BP: (!) 144/71 (!) 140/38  Pulse: 74 80  Resp:    Temp:    SpO2: 97% 96%    General: well nourished ambulating in room with steady  gait. Very HOH Cardiovascular: rrr no mgr no LE edema Respiratory: normal effort BS clear bilaterally no wheeze or rhonchi  Discharge Instructions   Discharge Instructions    Diet - low sodium heart healthy   Complete by:  As directed    Discharge instructions   Complete by:  As directed    Follow up with PCP 2-3 weeks for evaluation of symptoms   Increase activity slowly   Complete by:  As directed      Allergies as of 06/01/2018   No Known Allergies     Medication List    TAKE these medications   aspirin EC 81 MG tablet Take 81 mg by mouth daily.   clonazePAM 0.5 MG tablet Commonly known as:  KLONOPIN Take 0.5 mg by mouth at bedtime as needed for anxiety (sleep).   colesevelam 625 MG tablet Commonly known as:  WELCHOL Take 625 mg by mouth daily.   ENSURE Take 237 mLs by mouth daily.   linaclotide 145 MCG Caps capsule Commonly known as:  LINZESS Take 1 capsule (145 mcg total) by mouth daily before breakfast.   multivitamin with minerals Tabs tablet Take 1 tablet by mouth daily.   nitroGLYCERIN 0.4 MG SL tablet Commonly known as:  NITROSTAT Place 1 tablet (0.4 mg total) under the tongue every 5 (five) minutes as needed for chest pain.   olmesartan 40 MG tablet Commonly known as:  BENICAR Take 40 mg by mouth every other day. Every Tuesday, Thursday, Saturday morning   olmesartan-hydrochlorothiazide 40-12.5 MG tablet Commonly known as:  BENICAR HCT Take 1 tablet by mouth every other day. every Sunday, Monday, Wednesday, Friday morning   pantoprazole 40 MG tablet Commonly known as:  PROTONIX Take 1 tablet (40 mg total) by mouth 2 (two) times daily before a meal.   polyethylene glycol packet Commonly known as:  MIRALAX / GLYCOLAX Take 17 g by mouth daily.   sucralfate 1 GM/10ML suspension Commonly known as:  CARAFATE TAKE 10 MLS (1 G TOTAL) BY MOUTH 4 (FOUR) TIMES DAILY - WITH MEALS AND AT BEDTIME. What changed:    how much to take  how to take  this  when to take this  additional instructions      No Known Allergies    The results of significant diagnostics from this hospitalization (including imaging, microbiology, ancillary and laboratory) are listed below for reference.    Significant Diagnostic Studies: Dg Chest 2 View  Result Date: 05/30/2018 CLINICAL DATA:  Chest pain EXAM: CHEST - 2 VIEW COMPARISON:  Chest x-ray dated 02/10/2018. FINDINGS: Heart size and mediastinal contours are stable. Atherosclerotic changes again noted at the aortic arch. Lungs are clear. No pleural effusion or pneumothorax seen. No acute or suspicious osseous finding. IMPRESSION: 1. No active cardiopulmonary disease. No evidence of pneumonia or pulmonary edema. 2. Aortic atherosclerosis. Electronically Signed   By: Franki Cabot M.D.   On: 05/30/2018 08:28   Ct Abdomen Pelvis W Contrast  Result Date: 05/30/2018 CLINICAL DATA:  Abdominal pain and diarrhea. EXAM: CT ABDOMEN AND PELVIS WITH CONTRAST TECHNIQUE: Multidetector CT imaging of the abdomen and pelvis was performed using the standard protocol following bolus administration of intravenous contrast. CONTRAST:  171mL OMNIPAQUE IOHEXOL 300 MG/ML  SOLN COMPARISON:  06/15/2017. FINDINGS: Lower chest: Minimal bilateral dependent atelectasis. Hepatobiliary: Mild diffuse low density of the liver relative to the spleen. Cholecystectomy clips. Stable intrahepatic biliary ductal dilatation, within normal limits for the age of the patient, following cholecystectomy. Pancreas: Large duodenal diverticulum in the head of the pancreas. The remainder the pancreas is unremarkable. Spleen: Normal in size without focal abnormality. Adrenals/Urinary Tract: Normal appearing adrenal glands. Left renal cysts. Tiny right renal cyst. Unremarkable urinary bladder and ureters. Stomach/Bowel: Stomach is within normal limits. Appendix appears normal. No evidence of bowel wall thickening, distention, or inflammatory changes.  Vascular/Lymphatic: Atheromatous arterial calcifications without aneurysm. No enlarged lymph nodes. Reproductive: Uterus and bilateral adnexa are unremarkable. Other: Small ventral hernia containing fat above the level of the umbilicus. Musculoskeletal: Lumbar and lower thoracic spine degenerative changes. These include extensive facet degenerative changes with associated grade 1 anterolisthesis at the L4-5 level. IMPRESSION: 1. No acute abnormality. 2. Mild diffuse hepatic steatosis. 3. Stable small supraumbilical ventral hernia containing fat. Electronically Signed   By: Claudie Revering M.D.   On: 05/30/2018 10:31   Nm Myocar Multi W/spect W/wall Motion / Ef  Result Date: 05/31/2018 CLINICAL DATA:  85 year old with possible acute coronary syndrome. Negative troponin. EXAM: MYOCARDIAL IMAGING WITH SPECT (REST AND PHARMACOLOGIC-STRESS) GATED LEFT VENTRICULAR WALL MOTION STUDY LEFT VENTRICULAR EJECTION FRACTION TECHNIQUE: Standard myocardial SPECT imaging was performed after resting intravenous injection of 10 mCi Tc-59m tetrofosmin. Subsequently, intravenous infusion of Lexiscan was performed under the supervision of the Cardiology staff. At peak effect of the drug, 30 mCi Tc-72m tetrofosmin was injected intravenously and standard myocardial SPECT imaging was performed. Quantitative gated imaging was also performed to evaluate left ventricular wall motion, and estimate left ventricular ejection fraction. COMPARISON:  Chest radiograph 05/30/2018 FINDINGS: Perfusion: Decreased activity along the inferior wall on both the  rest and stress images. Mild reversibility along the apical segment of the inferior wall. No other areas are concerning for reversibility or ischemia. Wall Motion: Global hypokinesia and abnormal wall motion. Difficult to assess for individual wall dyskinesia. Left Ventricular Ejection Fraction: 17 % End diastolic volume 39 ml End systolic volume 32 ml IMPRESSION: 1. Concern for mild reversibility  along the inferior wall in the apical segment. No other areas are concerning for reversibility. 2. Global hypokinesis and difficult to assess for wall dyskinesia. 3. Left ventricular ejection fraction 17% 4. Non invasive risk stratification*: High *2012 Appropriate Use Criteria for Coronary Revascularization Focused Update: J Am Coll Cardiol. 6962;95(2):841-324. http://[email or web address] Electronically Signed   By: Markus Daft M.D.   On: 05/31/2018 15:04    Microbiology: Recent Results (from the past 240 hour(s))  Urine culture     Status: Abnormal   Collection Time: 05/30/18 11:30 AM  Result Value Ref Range Status   Specimen Description URINE, RANDOM  Final   Special Requests NONE  Final   Culture (A)  Final    <10,000 COLONIES/mL INSIGNIFICANT GROWTH Performed at [facility], [address] [place], [place] [address]    Report Status 05/31/2018 FINAL  Final     Labs: Basic Metabolic Panel: Recent Labs  Lab 05/30/18 0834 06/01/18 0540  NA 140 139  K 3.5 3.7  CL 105 103  CO2 25 28  GLUCOSE 101* 98  BUN 12 17  CREATININE 0.91 1.02*  CALCIUM 9.4 9.3   Liver Function Tests: Recent Labs  Lab 05/30/18 0834  AST 20  ALT 14  ALKPHOS 52  BILITOT 0.7  PROT 6.4*  ALBUMIN 3.6   Recent Labs  Lab 05/30/18 0834  LIPASE 24   No results for input(s): AMMONIA in the last 168 hours. CBC: Recent Labs  Lab 05/30/18 0834 06/01/18 0540  WBC 2.8* 4.6  NEUTROABS 1.6*  --   HGB 11.7* 13.0  HCT 36.7 40.2  MCV 90.0 89.5  PLT 189 220   Cardiac Enzymes: Recent Labs  Lab 05/30/18 1127 05/30/18 1659 05/30/18 2312  TROPONINI <0.03 0.03* 0.03*   BNP: BNP (last 3 results) No results for input(s): BNP in the last 8760 hours.  ProBNP (last 3 results) No results for input(s): PROBNP in the last 8760 hours.  CBG: No results for input(s): GLUCAP in the last 168 hours.     SignedRadene Gunning NP [facility] 06/01/2018,  1:28 PM

## 2018-05-31 NOTE — Consult Note (Addendum)
Cardiology Consultation:   Patient ID: Ann Riddle MRN: [id number]; DOB: Oct 24, 1921  Admit date: 05/30/2018 Date of Consult: 05/31/2018  Primary Care Provider: Velna Hatchet, MD Primary Cardiologist: No primary care provider on file. new  Primary Electrophysiologist:  None    Patient Profile:   Ann Riddle is a 85 y.o. female with a hx of GERD, HTN and HLD who is being seen today for the evaluation of chest pain at the request of Dr. Eliseo Squires.  History of Present Illness:   Ann Riddle has a hx of GERD, HTN and HLD and was seen in 2018 with chest pain but nuc study was not done.  (was to be done as outpt) echo at that time, pattern of moderate LVH, EF 75-80%, There was dynamic obstruction in the mid cavity, with a peak velocity of 350 cm/sec and a peak gradient of 49 mm Hg. Wall motion was normal; there were no regional wall motion abnormalities.  Mild MR and PA pk pressure 34 mmHg.    Now presents with 24 hours of chest pain.  It did radiate down Lt arm and back.  NTG given and ASA  With relief.  Troponin is neg 0.03 X 2.   She also complained of abd pain and had abd CT with no acute process.   This pain was much worse than her usual.  Mostly occurs when she lies down at night to sleep.  Normally she sits up and rubs her chest with alcohol and it eventually goes away.  On admit it did not and it was severe pain so called EMS.    New echo with EF 65-70%, no RWMA.  There is an intracavitary   dynamic peak gradient of 40 mmHg created by LVH and hyperdynamic   LV. Doppler parameters are consistent with abnormal left ventricular relaxation (grade 1 diastolic dysfunction). Doppler parameters are consistent with high ventricular filling pressure.  Mild MS,  LA and RA  moderately dilated.  Mild to mod TR.    EKG:  The EKG was personally reviewed and demonstrates:  SR and no acute changes. Normal.  Telemetry:  Telemetry was personally reviewed and demonstrates:  SR  Troponin 0.03 X 2 Na 140,  K+ 3.5 Cr 0.91 WBC 2.8, Hgb 11.7 plts 189  2V CXR No active cardiopulmonary disease. No evidence of pneumonia or pulmonary edema.  Aortic atherosclerosis  Currently no pain no complaints.  She can clean her house but rarely but no pain with this.     Past Medical History:  Diagnosis Date  . Diaphragmatic hernia without mention of obstruction or gangrene   . Diverticulosis of colon (without mention of hemorrhage)   . Esophageal reflux   . Irritable bowel syndrome   . Other and unspecified hyperlipidemia   . Unspecified essential hypertension   . Ventral hernia, unspecified, without mention of obstruction or gangrene     Past Surgical History:  Procedure Laterality Date  . CHOLECYSTECTOMY    . HIATAL HERNIA REPAIR       Home Medications:  Prior to Admission medications   Medication Sig Start Date End Date Taking? Authorizing Provider  aspirin EC 81 MG tablet Take 81 mg by mouth daily.   Yes [provider]  clonazePAM (KLONOPIN) 0.5 MG tablet Take 0.5 mg by mouth at bedtime as needed for anxiety (sleep).    Yes [provider]  colesevelam (WELCHOL) 625 MG tablet Take 625 mg by mouth daily.    Yes [provider]  ENSURE (ENSURE) Take 237 mLs by mouth daily.   Yes [provider]  linaclotide Rolan Lipa) 145 MCG CAPS capsule Take 1 capsule (145 mcg total) by mouth daily before breakfast. 11/03/17  Yes Milus Banister, MD  Multiple Vitamin (MULTIVITAMIN WITH MINERALS) TABS tablet Take 1 tablet by mouth daily.   Yes [provider]  nitroGLYCERIN (NITROSTAT) 0.4 MG SL tablet Place 1 tablet (0.4 mg total) under the tongue every 5 (five) minutes as needed for chest pain. 11/14/15  Yes Thurnell Lose, MD  olmesartan (BENICAR) 40 MG tablet Take 40 mg by mouth every other day. Every Tuesday, Thursday, Saturday morning 04/27/17  Yes [provider]  olmesartan-hydrochlorothiazide (BENICAR HCT) 40-12.5 MG per tablet Take 1 tablet by  mouth every other day. every Sunday, Monday, Wednesday, Friday morning   Yes [provider]  pantoprazole (PROTONIX) 40 MG tablet Take 1 tablet (40 mg total) by mouth 2 (two) times daily before a meal. 05/06/18  Yes Milus Banister, MD  polyethylene glycol [facility] / GLYCOLAX) packet Take 17 g by mouth daily.   Yes [provider]  sucralfate (CARAFATE) 1 GM/10ML suspension TAKE 10 MLS (1 G TOTAL) BY MOUTH 4 (FOUR) TIMES DAILY - WITH MEALS AND AT BEDTIME. Patient taking differently: Take 1 g by mouth 4 (four) times daily -  with meals and at bedtime. . 07/31/17  Yes Levin Erp, PA    Inpatient Medications: Scheduled Meds: . aspirin EC  81 mg Oral Daily  . colesevelam  625 mg Oral Daily  . enoxaparin (LOVENOX) injection  30 mg Subcutaneous Q24H  . feeding supplement (ENSURE ENLIVE)  237 mL Oral Daily  . irbesartan  300 mg Oral Daily  . linaclotide  145 mcg Oral QAC breakfast  . multivitamin with minerals  1 tablet Oral Daily  . pantoprazole  40 mg Oral BID AC  . polyethylene glycol  17 g Oral Daily  . sucralfate  1 g Oral TID WC & HS   Continuous Infusions:  PRN Meds: acetaminophen, clonazePAM, morphine injection, nitroGLYCERIN, ondansetron (ZOFRAN) IV  Allergies:   No Known Allergies  Social History:   Social History   Socioeconomic History  . Marital status: Widowed    Spouse name: Not on file  . Number of children: 4  . Years of education: Not on file  . Highest education level: Not on file  Occupational History  . Occupation: retired  Scientific laboratory technician  . Financial resource strain: Not on file  . Food insecurity:    Worry: Not on file    Inability: Not on file  . Transportation needs:    Medical: Not on file    Non-medical: Not on file  Tobacco Use  . Smoking status: Never Smoker  . Smokeless tobacco: Never Used  Substance and Sexual Activity  . Alcohol use: No  . Drug use: No  . Sexual activity: Not on file  Lifestyle  . Physical  activity:    Days per week: Not on file    Minutes per session: Not on file  . Stress: Not on file  Relationships  . Social connections:    Talks on phone: Not on file    Gets together: Not on file    Attends religious service: Not on file    Active member of club or organization: Not on file    Attends meetings of clubs or organizations: Not on file    Relationship status: Not on file  . Intimate  partner violence:    Fear of current or ex partner: Not on file    Emotionally abused: Not on file    Physically abused: Not on file    Forced sexual activity: Not on file  Other Topics Concern  . Not on file  Social History Narrative  . Not on file    Family History:    Family History  Problem Relation Age of Onset  . Breast cancer Daughter 42  . Colon cancer Neg Hx      ROS:  Please see the history of present illness.  General:no colds or fevers, no weight changes Skin:no rashes or ulcers HEENT:no blurred vision, no congestion CV:see HPI PUL:see HPI GI:no diarrhea constipation or melena, no indigestion GU:no hematuria, no dysuria MS:no joint pain, no claudication Neuro:no syncope, no lightheadedness Endo:no diabetes, no thyroid disease  All other ROS reviewed and negative.     Physical Exam/Data:   Vitals:   05/30/18 1700 05/30/18 2100 05/31/18 0022 05/31/18 0435  BP: (!) 136/56 139/62 (!) 132/59 (P) 122/66  Pulse: 68 75 67 (P) 72  Resp: 17 18 18  (P) 18  Temp: 98 F (36.7 C) 98.5 F (36.9 C) 98.3 F (36.8 C) (P) 98.2 F (36.8 C)  TempSrc: Oral Oral Oral (P) Oral  SpO2: 98% 97% 97% (P) 96%    Intake/Output Summary (Last 24 hours) at 05/31/2018 0919 Last data filed at 05/31/2018 0900 Gross per 24 hour  Intake 480 ml  Output -  Net 480 ml   Last 3 Weights 03/22/2018 02/10/2018 10/05/2017  Weight (lbs) 129 lb 130 lb 129 lb 6.4 oz  Weight (kg) 58.514 kg 58.968 kg 58.695 kg     There is no height or weight on file to calculate BMI.  General:  Well nourished,  well developed, in no acute distress HEENT: normal Lymph: no adenopathy Neck: no JVD Endocrine:  No thryomegaly Vascular: No carotid bruits; pedal pulses 2+ bilaterally Cardiac:  normal S1, S2; RRR; 9-7/9 systolic murmur no gallup rub or click Lungs:  clear to auscultation bilaterally, no wheezing, rhonchi or rales  Abd: soft, nontender, no hepatomegaly  Ext: no edema Musculoskeletal:  No deformities, BUE and BLE strength normal and equal Skin: warm and dry  Neuro:  Alert and oriented X 3 MAE follows, no focal abnormalities noted Psych:  Normal affect   Relevant CV Studies: Echo 05/30/18  Study Conclusions  - Left ventricle: The cavity size was normal. Wall thickness was   increased in a pattern of moderate to severe LVH. Systolic   function was vigorous. The estimated ejection fraction was in the   range of 65% to 70%. Wall motion was normal; there were no   regional wall motion abnormalities. There is an intracavitary   dynamic peak gradient of 40 mmHg created by LVH and hyperdynamic   LV. Doppler parameters are consistent with abnormal left   ventricular relaxation (grade 1 diastolic dysfunction). Doppler   parameters are consistent with high ventricular filling pressure. - Aortic valve: Moderately calcified annulus. Trileaflet; mildly   thickened leaflets. There was moderate regurgitation. Valve area   (VTI): 1.73 cm^2. Valve area (Vmax): 1.77 cm^2. Valve area   (Vmean): 2.04 cm^2. - Mitral valve: Moderately to severely calcified annulus.   Moderately thickened leaflets . The findings are consistent with   mild stenosis. There was mild regurgitation. Mean gradient (D): 4   mm Hg. Valve area by pressure half-time: 1.79 cm^2. - Left atrium: The atrium was moderately dilated. -  Right atrium: The atrium was moderately dilated. - Atrial septum: No defect or patent foramen ovale was identified. - Tricuspid valve: There was mild-moderate regurgitation. - Pulmonary arteries:  Systolic pressure was mildly increased. PA   peak pressure: 36 mm Hg (S).   Laboratory Data:  Chemistry Recent Labs  Lab 05/30/18 0834  NA 140  K 3.5  CL 105  CO2 25  GLUCOSE 101*  BUN 12  CREATININE 0.91  CALCIUM 9.4  GFRNONAA 54*  GFRAA >60  ANIONGAP 10    Recent Labs  Lab 05/30/18 0834  PROT 6.4*  ALBUMIN 3.6  AST 20  ALT 14  ALKPHOS 52  BILITOT 0.7   Hematology Recent Labs  Lab 05/30/18 0834  WBC 2.8*  RBC 4.08  HGB 11.7*  HCT 36.7  MCV 90.0  MCH 28.7  MCHC 31.9  RDW 12.9  PLT 189   Cardiac Enzymes Recent Labs  Lab 05/30/18 1127 05/30/18 1659 05/30/18 2312  TROPONINI <0.03 0.03* 0.03*    Recent Labs  Lab 05/30/18 0838 05/30/18 1108  TROPIPOC 0.02 0.02    BNPNo results for input(s): BNP, PROBNP in the last 168 hours.  DDimer No results for input(s): DDIMER in the last 168 hours.  Radiology/Studies:  Dg Chest 2 View  Result Date: 05/30/2018 CLINICAL DATA:  Chest pain EXAM: CHEST - 2 VIEW COMPARISON:  Chest x-ray dated 02/10/2018. FINDINGS: Heart size and mediastinal contours are stable. Atherosclerotic changes again noted at the aortic arch. Lungs are clear. No pleural effusion or pneumothorax seen. No acute or suspicious osseous finding. IMPRESSION: 1. No active cardiopulmonary disease. No evidence of pneumonia or pulmonary edema. 2. Aortic atherosclerosis. Electronically Signed   By: Franki Cabot M.D.   On: 05/30/2018 08:28   Ct Abdomen Pelvis W Contrast  Result Date: 05/30/2018 CLINICAL DATA:  Abdominal pain and diarrhea. EXAM: CT ABDOMEN AND PELVIS WITH CONTRAST TECHNIQUE: Multidetector CT imaging of the abdomen and pelvis was performed using the standard protocol following bolus administration of intravenous contrast. CONTRAST:  146mL OMNIPAQUE IOHEXOL 300 MG/ML  SOLN COMPARISON:  06/15/2017. FINDINGS: Lower chest: Minimal bilateral dependent atelectasis. Hepatobiliary: Mild diffuse low density of the liver relative to the spleen.  Cholecystectomy clips. Stable intrahepatic biliary ductal dilatation, within normal limits for the age of the patient, following cholecystectomy. Pancreas: Large duodenal diverticulum in the head of the pancreas. The remainder the pancreas is unremarkable. Spleen: Normal in size without focal abnormality. Adrenals/Urinary Tract: Normal appearing adrenal glands. Left renal cysts. Tiny right renal cyst. Unremarkable urinary bladder and ureters. Stomach/Bowel: Stomach is within normal limits. Appendix appears normal. No evidence of bowel wall thickening, distention, or inflammatory changes. Vascular/Lymphatic: Atheromatous arterial calcifications without aneurysm. No enlarged lymph nodes. Reproductive: Uterus and bilateral adnexa are unremarkable. Other: Small ventral hernia containing fat above the level of the umbilicus. Musculoskeletal: Lumbar and lower thoracic spine degenerative changes. These include extensive facet degenerative changes with associated grade 1 anterolisthesis at the L4-5 level. IMPRESSION: 1. No acute abnormality. 2. Mild diffuse hepatic steatosis. 3. Stable small supraumbilical ventral hernia containing fat. Electronically Signed   By: Claudie Revering M.D.   On: 05/30/2018 10:31    Assessment and Plan:   1. Chest pain, with troponin 0.03  relief with NTG - pain comes mostly at night - her GERD meds have been increased without much help.  She cannot walk on a treadmill.  Will do lexiscan myoview to rule out  If Dr. Debara Pickett agrees.  2. GERD on Carafate at home  and protonix. 3. HTN controlled. 4.   On welchol for HLD.      For questions or updates, please contact [place] Please consult [email or web address] for contact info under     Signed, Cecilie Kicks, NP  05/31/2018 9:19 AM

## 2018-05-31 NOTE — Care Management Obs Status (Signed)
[facility] NOTIFICATION   Patient Details  Name: Ann Riddle MRN: [id number] Date of Birth: 11-09-1921   Medicare Observation Status Notification Given:  Yes    Midge Minium RN, BSN, NCM-BC, ACM-RN [phone] 05/31/2018, 3:52 PM

## 2018-06-01 ENCOUNTER — Encounter (HOSPITAL_COMMUNITY)
Admission: EM | Disposition: A | Discharge status: Home / Self Care | Payer: Self-pay | Source: Home / Self Care | Attending: Emergency Medicine

## 2018-06-01 DIAGNOSIS — R931 Abnormal findings on diagnostic imaging of heart and coronary circulation: Secondary | ICD-10-CM

## 2018-06-01 DIAGNOSIS — R0789 Other chest pain: Secondary | ICD-10-CM | POA: Diagnosis not present

## 2018-06-01 DIAGNOSIS — R9439 Abnormal result of other cardiovascular function study: Secondary | ICD-10-CM

## 2018-06-01 DIAGNOSIS — K219 Gastro-esophageal reflux disease without esophagitis: Secondary | ICD-10-CM | POA: Diagnosis not present

## 2018-06-01 DIAGNOSIS — R109 Unspecified abdominal pain: Secondary | ICD-10-CM

## 2018-06-01 DIAGNOSIS — E782 Mixed hyperlipidemia: Secondary | ICD-10-CM

## 2018-06-01 DIAGNOSIS — I200 Unstable angina: Secondary | ICD-10-CM

## 2018-06-01 HISTORY — PX: CORONARY ANGIOGRAPHY: CATH118303

## 2018-06-01 LAB — CBC
HCT: 40.2 % (ref 36.0–46.0)
Hemoglobin: 13 g/dL (ref 12.0–15.0)
MCH: 29 pg (ref 26.0–34.0)
MCHC: 32.3 g/dL (ref 30.0–36.0)
MCV: 89.5 fL (ref 80.0–100.0)
Platelets: 220 10*3/uL (ref 150–400)
RBC: 4.49 MIL/uL (ref 3.87–5.11)
RDW: 12.9 % (ref 11.5–15.5)
WBC: 4.6 10*3/uL (ref 4.0–10.5)
nRBC: 0 % (ref 0.0–0.2)

## 2018-06-01 LAB — BASIC METABOLIC PANEL
Anion gap: 8 (ref 5–15)
BUN: 17 mg/dL (ref 8–23)
CO2: 28 mmol/L (ref 22–32)
CREATININE: 1.02 mg/dL — AB (ref 0.44–1.00)
Calcium: 9.3 mg/dL (ref 8.9–10.3)
Chloride: 103 mmol/L (ref 98–111)
GFR calc Af Amer: 55 mL/min — ABNORMAL LOW (ref >60)
GFR calc non Af Amer: 47 mL/min — ABNORMAL LOW (ref >60)
Glucose, Bld: 98 mg/dL (ref 70–99)
Potassium: 3.7 mmol/L (ref 3.5–5.1)
Sodium: 139 mmol/L (ref 135–145)

## 2018-06-01 SURGERY — CORONARY ANGIOGRAPHY (CATH LAB)
Anesthesia: LOCAL

## 2018-06-01 MED ORDER — HEPARIN (PORCINE) IN NACL 1000-0.9 UT/500ML-% IV SOLN
INTRAVENOUS | Status: AC
  Filled 2018-06-01: qty 500

## 2018-06-01 MED ORDER — LIDOCAINE HCL (PF) 1 % IJ SOLN
INTRAMUSCULAR | Status: AC
  Filled 2018-06-01: qty 30

## 2018-06-01 MED ORDER — HEPARIN (PORCINE) IN NACL 1000-0.9 UT/500ML-% IV SOLN
INTRAVENOUS | Status: DC | PRN
  Administered 2018-06-01: 500 mL

## 2018-06-01 MED ORDER — MIDAZOLAM HCL 2 MG/2ML IJ SOLN
INTRAMUSCULAR | Status: AC
  Filled 2018-06-01: qty 2

## 2018-06-01 MED ORDER — SODIUM CHLORIDE 0.9% FLUSH: 3.0000 mL | Freq: Two times a day (BID) | INTRAVENOUS | Status: DC

## 2018-06-01 MED ORDER — SODIUM CHLORIDE 0.9 % WEIGHT BASED INFUSION: 1.0000 mL/kg/h | INTRAVENOUS | Status: DC

## 2018-06-01 MED ORDER — HEPARIN SODIUM (PORCINE) 1000 UNIT/ML IJ SOLN
INTRAMUSCULAR | Status: AC
  Filled 2018-06-01: qty 1

## 2018-06-01 MED ORDER — IOHEXOL 350 MG/ML SOLN
INTRAVENOUS | Status: DC | PRN
  Administered 2018-06-01: 40 mL via INTRA_ARTERIAL

## 2018-06-01 MED ORDER — VERAPAMIL HCL 2.5 MG/ML IV SOLN
INTRAVENOUS | Status: DC | PRN
  Administered 2018-06-01: 10 mL via INTRA_ARTERIAL

## 2018-06-01 MED ORDER — HEPARIN SODIUM (PORCINE) 1000 UNIT/ML IJ SOLN
INTRAMUSCULAR | Status: DC | PRN
  Administered 2018-06-01: 3000 [IU] via INTRAVENOUS

## 2018-06-01 MED ORDER — MIDAZOLAM HCL 2 MG/2ML IJ SOLN
INTRAMUSCULAR | Status: DC | PRN
  Administered 2018-06-01: 1 mg via INTRAVENOUS

## 2018-06-01 MED ORDER — SODIUM CHLORIDE 0.9% FLUSH: 3.0000 mL | INTRAVENOUS | Status: DC | PRN

## 2018-06-01 MED ORDER — VERAPAMIL HCL 2.5 MG/ML IV SOLN
INTRAVENOUS | Status: AC
  Filled 2018-06-01: qty 2

## 2018-06-01 MED ORDER — FENTANYL CITRATE (PF) 100 MCG/2ML IJ SOLN
INTRAMUSCULAR | Status: AC
  Filled 2018-06-01: qty 2

## 2018-06-01 MED ORDER — LIDOCAINE HCL (PF) 1 % IJ SOLN
INTRAMUSCULAR | Status: DC | PRN
  Administered 2018-06-01: 2 mL

## 2018-06-01 MED ORDER — ACETAMINOPHEN 325 MG PO TABS
650.0000 mg | ORAL_TABLET | ORAL | Status: DC | PRN
  Filled 2018-06-01: qty 2

## 2018-06-01 MED ORDER — SODIUM CHLORIDE 0.9 % IV SOLN: 250.0000 mL | INTRAVENOUS | Status: DC | PRN

## 2018-06-01 SURGICAL SUPPLY — 10 items
CATH 5FR JL3.5 JR4 ANG PIG MP (CATHETERS) ×1 IMPLANT
DEVICE RAD COMP TR BAND LRG (VASCULAR PRODUCTS) ×1 IMPLANT
GLIDESHEATH SLEND SS 6F .021 (SHEATH) ×1 IMPLANT
GUIDEWIRE INQWIRE 1.5J.035X260 (WIRE) IMPLANT
INQWIRE 1.5J .035X260CM (WIRE) ×2
KIT HEART LEFT (KITS) ×2 IMPLANT
PACK CARDIAC CATHETERIZATION (CUSTOM PROCEDURE TRAY) ×2 IMPLANT
TRANSDUCER W/STOPCOCK (MISCELLANEOUS) ×2 IMPLANT
TUBING CIL FLEX 10 FLL-RA (TUBING) ×2 IMPLANT
WIRE HI TORQ VERSACORE-J 145CM (WIRE) ×1 IMPLANT

## 2018-06-01 NOTE — H&P (View-Only) (Signed)
DAILY PROGRESS NOTE   Patient Name: Ann Riddle Date of Encounter: 06/01/2018 Cardiologist: No primary care provider on file.  Chief Complaint   Chest pain  Patient Profile   Ann Riddle is a 85 y.o. female with a hx of GERD, HTN and HLD who is being seen today for the evaluation of chest pain at the request of Dr. Eliseo Squires.  Subjective   Chest pain and bilateral arm pain overnight- improved with morphine. After more discussion, she is willing to undergo cath today.  Objective   Vitals:   05/31/18 1458 05/31/18 2100 05/31/18 2300 06/01/18 0412  BP: (!) 160/68 (!) 145/67 126/62 (!) 145/67  Pulse: 77 74 74 76  Resp: 16 20 18 16   Temp: 98 F (36.7 C) 98.7 F (37.1 C) 98.3 F (36.8 C) 98.2 F (36.8 C)  TempSrc: Oral Oral Oral Oral  SpO2: 100% 97% 98% 98%  Weight:      Height:        Intake/Output Summary (Last 24 hours) at 06/01/2018 0844 Last data filed at 06/01/2018 0600 Gross per 24 hour  Intake 553.95 ml  Output -  Net 553.95 ml   Filed Weights   05/31/18 1049  Weight: 58.5 kg    Physical Exam   General appearance: alert and no distress Lungs: clear to auscultation bilaterally Heart: regular rate and rhythm Extremities: extremities normal, atraumatic, no cyanosis or edema Neurologic: Grossly normal  Inpatient Medications    Scheduled Meds: . [START ON 06/02/2018] aspirin EC  81 mg Oral Daily  . colesevelam  625 mg Oral Daily  . enoxaparin (LOVENOX) injection  30 mg Subcutaneous Q24H  . feeding supplement (ENSURE ENLIVE)  237 mL Oral Daily  . irbesartan  300 mg Oral Daily  . linaclotide  145 mcg Oral QAC breakfast  . multivitamin with minerals  1 tablet Oral Daily  . pantoprazole  40 mg Oral BID AC  . polyethylene glycol  17 g Oral Daily  . sodium chloride flush  3 mL Intravenous Q12H  . sucralfate  1 g Oral TID WC & HS    Continuous Infusions: . sodium chloride    . sodium chloride 1 mL/kg/hr (06/01/18 0541)    PRN Meds: sodium chloride,  acetaminophen, clonazePAM, morphine injection, nitroGLYCERIN, ondansetron (ZOFRAN) IV, sodium chloride flush   Labs   Results for orders placed or performed during the hospital encounter of 05/30/18 (from the past 48 hour(s))  I-Stat Troponin, ED - 0, 3, 6 hours (not at [facility])     Status: None   Collection Time: 05/30/18 11:08 AM  Result Value Ref Range   Troponin i, poc 0.02 0.00 - 0.08 ng/mL   Comment 3            Comment: Due to the release kinetics of cTnI, a negative result within the first hours of the onset of symptoms does not rule out myocardial infarction with certainty. If myocardial infarction is still suspected, repeat the test at appropriate intervals.   Troponin I - Now Then Q6H     Status: None   Collection Time: 05/30/18 11:27 AM  Result Value Ref Range   Troponin I <0.03 <0.03 ng/mL    Comment: Performed at [facility] [address] [place], [place] [address]  Urinalysis, Routine w reflex microscopic     Status: Abnormal   Collection Time: 05/30/18 11:30 AM  Result Value Ref Range   Color, Urine STRAW (A) YELLOW   APPearance CLEAR  CLEAR   Specific Gravity, Urine 1.019 1.005 - 1.030   pH 8.0 5.0 - 8.0   Glucose, UA NEGATIVE NEGATIVE mg/dL   Hgb urine dipstick SMALL (A) NEGATIVE   Bilirubin Urine NEGATIVE NEGATIVE   Ketones, ur NEGATIVE NEGATIVE mg/dL   Protein, ur NEGATIVE NEGATIVE mg/dL   Nitrite NEGATIVE NEGATIVE   Leukocytes, UA NEGATIVE NEGATIVE   RBC / HPF 0-5 0 - 5 RBC/hpf   Bacteria, UA NONE SEEN NONE SEEN   Squamous Epithelial / LPF 0-5 0 - 5    Comment: Performed at [facility], [place] [address], [place], [place] [address]  Urine culture     Status: Abnormal   Collection Time: 05/30/18 11:30 AM  Result Value Ref Range   Specimen Description URINE, RANDOM    Special Requests NONE    Culture (A)     <10,000 COLONIES/mL INSIGNIFICANT GROWTH Performed at [facility], [place] [address] [place], [place] [address]    Report Status  05/31/2018 FINAL   Troponin I - Now Then Q6H     Status: Abnormal   Collection Time: 05/30/18  4:59 PM  Result Value Ref Range   Troponin I 0.03 (HH) <0.03 ng/mL    Comment: CRITICAL RESULT CALLED TO, READ BACK BY AND VERIFIED WITH: B.WAUGH,RN @ 1819 05/30/2018 [place] Performed at [facility], [place] [address], [place], [place] [address]   Troponin I - Now Then Q6H     Status: Abnormal   Collection Time: 05/30/18 11:12 PM  Result Value Ref Range   Troponin I 0.03 (HH) <0.03 ng/mL    Comment: CRITICAL VALUE NOTED.  VALUE IS CONSISTENT WITH PREVIOUSLY REPORTED AND CALLED VALUE. Performed at [facility], [place] [address] [place] [address]   CBC     Status: None   Collection Time: 06/01/18  5:40 AM  Result Value Ref Range   WBC 4.6 4.0 - 10.5 K/uL   RBC 4.49 3.87 - 5.11 MIL/uL   Hemoglobin 13.0 12.0 - 15.0 g/dL   HCT 40.2 36.0 - 46.0 %   MCV 89.5 80.0 - 100.0 fL   MCH 29.0 26.0 - 34.0 pg   MCHC 32.3 30.0 - 36.0 g/dL   RDW 12.9 11.5 - 15.5 %   Platelets 220 150 - 400 K/uL   nRBC 0.0 0.0 - 0.2 %    Comment: Performed at [facility], [place] [address], [place], [place] [address]    ECG   N/A  Telemetry   Sinus rhythm - Personally Reviewed  Radiology    Ct Abdomen Pelvis W Contrast  Result Date: 05/30/2018 CLINICAL DATA:  Abdominal pain and diarrhea. EXAM: CT ABDOMEN AND PELVIS WITH CONTRAST TECHNIQUE: Multidetector CT imaging of the abdomen and pelvis was performed using the standard protocol following bolus administration of intravenous contrast. CONTRAST:  136mL OMNIPAQUE IOHEXOL 300 MG/ML  SOLN COMPARISON:  06/15/2017. FINDINGS: Lower chest: Minimal bilateral dependent atelectasis. Hepatobiliary: Mild diffuse low density of the liver relative to the spleen. Cholecystectomy clips. Stable intrahepatic biliary ductal dilatation, within normal limits for the age of the patient, following cholecystectomy. Pancreas: Large duodenal diverticulum in the head of  the pancreas. The remainder the pancreas is unremarkable. Spleen: Normal in size without focal abnormality. Adrenals/Urinary Tract: Normal appearing adrenal glands. Left renal cysts. Tiny right renal cyst. Unremarkable urinary bladder and ureters. Stomach/Bowel: Stomach is within normal limits. Appendix appears normal. No evidence of bowel wall thickening, distention, or inflammatory changes.  Vascular/Lymphatic: Atheromatous arterial calcifications without aneurysm. No enlarged lymph nodes. Reproductive: Uterus and bilateral adnexa are unremarkable. Other: Small ventral hernia containing fat above the level of the umbilicus. Musculoskeletal: Lumbar and lower thoracic spine degenerative changes. These include extensive facet degenerative changes with associated grade 1 anterolisthesis at the L4-5 level. IMPRESSION: 1. No acute abnormality. 2. Mild diffuse hepatic steatosis. 3. Stable small supraumbilical ventral hernia containing fat. Electronically Signed   By: Claudie Revering M.D.   On: 05/30/2018 10:31   Nm Myocar Multi W/spect W/wall Motion / Ef  Result Date: 05/31/2018 CLINICAL DATA:  85 year old with possible acute coronary syndrome. Negative troponin. EXAM: MYOCARDIAL IMAGING WITH SPECT (REST AND PHARMACOLOGIC-STRESS) GATED LEFT VENTRICULAR WALL MOTION STUDY LEFT VENTRICULAR EJECTION FRACTION TECHNIQUE: Standard myocardial SPECT imaging was performed after resting intravenous injection of 10 mCi Tc-54m tetrofosmin. Subsequently, intravenous infusion of Lexiscan was performed under the supervision of the Cardiology staff. At peak effect of the drug, 30 mCi Tc-16m tetrofosmin was injected intravenously and standard myocardial SPECT imaging was performed. Quantitative gated imaging was also performed to evaluate left ventricular wall motion, and estimate left ventricular ejection fraction. COMPARISON:  Chest radiograph 05/30/2018 FINDINGS: Perfusion: Decreased activity along the inferior wall on both the rest  and stress images. Mild reversibility along the apical segment of the inferior wall. No other areas are concerning for reversibility or ischemia. Wall Motion: Global hypokinesia and abnormal wall motion. Difficult to assess for individual wall dyskinesia. Left Ventricular Ejection Fraction: 17 % End diastolic volume 39 ml End systolic volume 32 ml IMPRESSION: 1. Concern for mild reversibility along the inferior wall in the apical segment. No other areas are concerning for reversibility. 2. Global hypokinesis and difficult to assess for wall dyskinesia. 3. Left ventricular ejection fraction 17% 4. Non invasive risk stratification*: High *2012 Appropriate Use Criteria for Coronary Revascularization Focused Update: J Am Coll Cardiol. 5366;44(0):347-425. http://content.[email or web address].aspx?articleid=[email or web address] Electronically Signed   By: Markus Daft M.D.   On: 05/31/2018 15:04    Cardiac Studies   N/A  Assessment   1. Principal Problem: 2.   Chest pain 3. Active Problems: 4.   Hyperlipidemia 5.   Essential hypertension 6.   GERD 7.   Plan   1. More chest pain overnight- improved with morphine. Also had bilateral arm pain. Given her abnormal myoview stress test and discordance with her echo findings, with ongoing symptoms, LHC is recommended. Long discussion with the patient and her daughter yesterday and the patient today about the risks, benefits and alternatives to cath - she is reasonably functional at home and I think would benefit from ischemia evaluation/treatment. She is agreeable to the procedure today.  Time Spent Directly with Patient:  I have spent a total of 25 minutes with the patient reviewing hospital notes, telemetry, EKGs, labs and examining the patient as well as establishing an assessment and plan that was discussed personally with the patient.  > 50% of time was spent in direct patient care.  Length of Stay:  LOS: 0 days   Pixie Casino, MD, [facility], [place] Director of the [facility] Lipid [facility]  Card[facility] Diplomate of the American Board of Clinical Lipidology Attending Cardiologist  Direct Dial: [phone]  Fax: [phone]  Website:  [email or web address] Galiano 06/01/2018, 8:44 AM

## 2018-06-01 NOTE — Discharge Instructions (Signed)
Radial Site Care ° °This sheet gives you information about how to care for yourself after your procedure. Your health care provider may also give you more specific instructions. If you have problems or questions, contact your health care provider. °What can I expect after the procedure? °After the procedure, it is common to have: °· Bruising and tenderness at the catheter insertion area. °Follow these instructions at home: °Medicines °· Take over-the-counter and prescription medicines only as told by your health care provider. °Insertion site care °· Follow instructions from your health care provider about how to take care of your insertion site. Make sure you: °? Wash your hands with soap and water before you change your bandage (dressing). If soap and water are not available, use hand sanitizer. °? Change your dressing as told by your health care provider. °? Leave stitches (sutures), skin glue, or adhesive strips in place. These skin closures may need to stay in place for 2 weeks or longer. If adhesive strip edges start to loosen and curl up, you may trim the loose edges. Do not remove adhesive strips completely unless your health care provider tells you to do that. °· Check your insertion site every day for signs of infection. Check for: °? Redness, swelling, or pain. °? Fluid or blood. °? Pus or a bad smell. °? Warmth. °· Do not take baths, swim, or use a hot tub until your health care provider approves. °· You may shower 24-48 hours after the procedure, or as directed by your health care provider. °? Remove the dressing and gently wash the site with plain soap and water. °? Pat the area dry with a clean towel. °? Do not rub the site. That could cause bleeding. °· Do not apply powder or lotion to the site. °Activity ° °· For 24 hours after the procedure, or as directed by your health care provider: °? Do not flex or bend the affected arm. °? Do not push or pull heavy objects with the affected arm. °? Do not  drive yourself home from the hospital or clinic. You may drive 24 hours after the procedure unless your health care provider tells you not to. °? Do not operate machinery or power tools. °· Do not lift anything that is heavier than 10 lb (4.5 kg), or the limit that you are told, until your health care provider says that it is safe. °· Ask your health care provider when it is okay to: °? Return to work or school. °? Resume usual physical activities or sports. °? Resume sexual activity. °General instructions °· If the catheter site starts to bleed, raise your arm and put firm pressure on the site. If the bleeding does not stop, get help right away. This is a medical emergency. °· If you went home on the same day as your procedure, a responsible adult should be with you for the first 24 hours after you arrive home. °· Keep all follow-up visits as told by your health care provider. This is important. °Contact a health care provider if: °· You have a fever. °· You have redness, swelling, or yellow drainage around your insertion site. °Get help right away if: °· You have unusual pain at the radial site. °· The catheter insertion area swells very fast. °· The insertion area is bleeding, and the bleeding does not stop when you hold steady pressure on the area. °· Your arm or hand becomes pale, cool, tingly, or numb. °These symptoms may represent a serious problem   that is an emergency. Do not wait to see if the symptoms will go away. Get medical help right away. Call your local emergency services (911 in the [place] Do not drive yourself to the hospital. °Summary °· After the procedure, it is common to have bruising and tenderness at the site. °· Follow instructions from your health care provider about how to take care of your radial site wound. Check the wound every day for signs of infection. °· Do not lift anything that is heavier than 10 lb (4.5 kg), or the limit that you are told, until your health care provider says  that it is safe. °This information is not intended to replace advice given to you by your health care provider. Make sure you discuss any questions you have with your health care provider. °Document Released: 05/24/2010 Document Revised: 05/27/2017 Document Reviewed: 05/27/2017 °Elsevier Interactive Patient Education © 2019 [employer] ° ° ° °Moderate Conscious Sedation, Adult, Care After °These instructions provide you with information about caring for yourself after your procedure. Your health care provider may also give you more specific instructions. Your treatment has been planned according to current medical practices, but problems sometimes occur. Call your health care provider if you have any problems or questions after your procedure. °What can I expect after the procedure? °After your procedure, it is common: °· To feel sleepy for several hours. °· To feel clumsy and have poor balance for several hours. °· To have poor judgment for several hours. °· To vomit if you eat too soon. °Follow these instructions at home: °For at least 24 hours after the procedure: ° °· Do not: °? Participate in activities where you could fall or become injured. °? Drive. °? Use heavy machinery. °? Drink alcohol. °? Take sleeping pills or medicines that cause drowsiness. °? Make important decisions or sign legal documents. °? Take care of children on your own. °· Rest. °Eating and drinking °· Follow the diet recommended by your health care provider. °· If you vomit: °? Drink water, juice, or soup when you can drink without vomiting. °? Make sure you have little or no nausea before eating solid foods. °General instructions °· Have a responsible adult stay with you until you are awake and alert. °· Take over-the-counter and prescription medicines only as told by your health care provider. °· If you smoke, do not smoke without supervision. °· Keep all follow-up visits as told by your health care provider. This is  important. °Contact a health care provider if: °· You keep feeling nauseous or you keep vomiting. °· You feel light-headed. °· You develop a rash. °· You have a fever. °Get help right away if: °· You have trouble breathing. °This information is not intended to replace advice given to you by your health care provider. Make sure you discuss any questions you have with your health care provider. °Document Released: 02/09/2013 Document Revised: 09/24/2015 Document Reviewed: 08/11/2015 °Elsevier Interactive Patient Education © 2019 [employer] ° °

## 2018-06-01 NOTE — Progress Notes (Signed)
DAILY PROGRESS NOTE   Patient Name: Ann Riddle Date of Encounter: 06/01/2018 Cardiologist: No primary care provider on file.  Chief Complaint   Chest pain  Patient Profile   Ann Riddle is a 85 y.o. female with a hx of GERD, HTN and HLD who is being seen today for the evaluation of chest pain at the request of Dr. Eliseo Squires.  Subjective   Chest pain and bilateral arm pain overnight- improved with morphine. After more discussion, she is willing to undergo cath today.  Objective   Vitals:   05/31/18 1458 05/31/18 2100 05/31/18 2300 06/01/18 0412  BP: (!) 160/68 (!) 145/67 126/62 (!) 145/67  Pulse: 77 74 74 76  Resp: 16 20 18 16   Temp: 98 F (36.7 C) 98.7 F (37.1 C) 98.3 F (36.8 C) 98.2 F (36.8 C)  TempSrc: Oral Oral Oral Oral  SpO2: 100% 97% 98% 98%  Weight:      Height:        Intake/Output Summary (Last 24 hours) at 06/01/2018 0844 Last data filed at 06/01/2018 0600 Gross per 24 hour  Intake 553.95 ml  Output -  Net 553.95 ml   Filed Weights   05/31/18 1049  Weight: 58.5 kg    Physical Exam   General appearance: alert and no distress Lungs: clear to auscultation bilaterally Heart: regular rate and rhythm Extremities: extremities normal, atraumatic, no cyanosis or edema Neurologic: Grossly normal  Inpatient Medications    Scheduled Meds: . [START ON 06/02/2018] aspirin EC  81 mg Oral Daily  . colesevelam  625 mg Oral Daily  . enoxaparin (LOVENOX) injection  30 mg Subcutaneous Q24H  . feeding supplement (ENSURE ENLIVE)  237 mL Oral Daily  . irbesartan  300 mg Oral Daily  . linaclotide  145 mcg Oral QAC breakfast  . multivitamin with minerals  1 tablet Oral Daily  . pantoprazole  40 mg Oral BID AC  . polyethylene glycol  17 g Oral Daily  . sodium chloride flush  3 mL Intravenous Q12H  . sucralfate  1 g Oral TID WC & HS    Continuous Infusions: . sodium chloride    . sodium chloride 1 mL/kg/hr (06/01/18 0541)    PRN Meds: sodium chloride,  acetaminophen, clonazePAM, morphine injection, nitroGLYCERIN, ondansetron (ZOFRAN) IV, sodium chloride flush   Labs   Results for orders placed or performed during the hospital encounter of 05/30/18 (from the past 48 hour(s))  I-Stat Troponin, ED - 0, 3, 6 hours (not at [facility])     Status: None   Collection Time: 05/30/18 11:08 AM  Result Value Ref Range   Troponin i, poc 0.02 0.00 - 0.08 ng/mL   Comment 3            Comment: Due to the release kinetics of cTnI, a negative result within the first hours of the onset of symptoms does not rule out myocardial infarction with certainty. If myocardial infarction is still suspected, repeat the test at appropriate intervals.   Troponin I - Now Then Q6H     Status: None   Collection Time: 05/30/18 11:27 AM  Result Value Ref Range   Troponin I <0.03 <0.03 ng/mL    Comment: Performed at [facility] [address] [place], [place] [address]  Urinalysis, Routine w reflex microscopic     Status: Abnormal   Collection Time: 05/30/18 11:30 AM  Result Value Ref Range   Color, Urine STRAW (A) YELLOW   APPearance CLEAR  CLEAR   Specific Gravity, Urine 1.019 1.005 - 1.030   pH 8.0 5.0 - 8.0   Glucose, UA NEGATIVE NEGATIVE mg/dL   Hgb urine dipstick SMALL (A) NEGATIVE   Bilirubin Urine NEGATIVE NEGATIVE   Ketones, ur NEGATIVE NEGATIVE mg/dL   Protein, ur NEGATIVE NEGATIVE mg/dL   Nitrite NEGATIVE NEGATIVE   Leukocytes, UA NEGATIVE NEGATIVE   RBC / HPF 0-5 0 - 5 RBC/hpf   Bacteria, UA NONE SEEN NONE SEEN   Squamous Epithelial / LPF 0-5 0 - 5    Comment: Performed at [facility], [place] [address] [place], [place] [address]  Urine culture     Status: Abnormal   Collection Time: 05/30/18 11:30 AM  Result Value Ref Range   Specimen Description URINE, RANDOM    Special Requests NONE    Culture (A)     <10,000 COLONIES/mL INSIGNIFICANT GROWTH Performed at [facility], [place] [address] [place], [place] [address]    Report Status  05/31/2018 FINAL   Troponin I - Now Then Q6H     Status: Abnormal   Collection Time: 05/30/18  4:59 PM  Result Value Ref Range   Troponin I 0.03 (HH) <0.03 ng/mL    Comment: CRITICAL RESULT CALLED TO, READ BACK BY AND VERIFIED WITH: B.WAUGH,RN @ 1819 05/30/2018 [place] Performed at [facility], [place] [address], [place], [place] [address]   Troponin I - Now Then Q6H     Status: Abnormal   Collection Time: 05/30/18 11:12 PM  Result Value Ref Range   Troponin I 0.03 (HH) <0.03 ng/mL    Comment: CRITICAL VALUE NOTED.  VALUE IS CONSISTENT WITH PREVIOUSLY REPORTED AND CALLED VALUE. Performed at [facility], [place] [address] [place] [address]   CBC     Status: None   Collection Time: 06/01/18  5:40 AM  Result Value Ref Range   WBC 4.6 4.0 - 10.5 K/uL   RBC 4.49 3.87 - 5.11 MIL/uL   Hemoglobin 13.0 12.0 - 15.0 g/dL   HCT 40.2 36.0 - 46.0 %   MCV 89.5 80.0 - 100.0 fL   MCH 29.0 26.0 - 34.0 pg   MCHC 32.3 30.0 - 36.0 g/dL   RDW 12.9 11.5 - 15.5 %   Platelets 220 150 - 400 K/uL   nRBC 0.0 0.0 - 0.2 %    Comment: Performed at [facility], [place] [address] [place], [place] [address]    ECG   N/A  Telemetry   Sinus rhythm - Personally Reviewed  Radiology    Ct Abdomen Pelvis W Contrast  Result Date: 05/30/2018 CLINICAL DATA:  Abdominal pain and diarrhea. EXAM: CT ABDOMEN AND PELVIS WITH CONTRAST TECHNIQUE: Multidetector CT imaging of the abdomen and pelvis was performed using the standard protocol following bolus administration of intravenous contrast. CONTRAST:  158mL OMNIPAQUE IOHEXOL 300 MG/ML  SOLN COMPARISON:  06/15/2017. FINDINGS: Lower chest: Minimal bilateral dependent atelectasis. Hepatobiliary: Mild diffuse low density of the liver relative to the spleen. Cholecystectomy clips. Stable intrahepatic biliary ductal dilatation, within normal limits for the age of the patient, following cholecystectomy. Pancreas: Large duodenal diverticulum in the head of  the pancreas. The remainder the pancreas is unremarkable. Spleen: Normal in size without focal abnormality. Adrenals/Urinary Tract: Normal appearing adrenal glands. Left renal cysts. Tiny right renal cyst. Unremarkable urinary bladder and ureters. Stomach/Bowel: Stomach is within normal limits. Appendix appears normal. No evidence of bowel wall thickening, distention, or inflammatory changes.  Vascular/Lymphatic: Atheromatous arterial calcifications without aneurysm. No enlarged lymph nodes. Reproductive: Uterus and bilateral adnexa are unremarkable. Other: Small ventral hernia containing fat above the level of the umbilicus. Musculoskeletal: Lumbar and lower thoracic spine degenerative changes. These include extensive facet degenerative changes with associated grade 1 anterolisthesis at the L4-5 level. IMPRESSION: 1. No acute abnormality. 2. Mild diffuse hepatic steatosis. 3. Stable small supraumbilical ventral hernia containing fat. Electronically Signed   By: Claudie Revering M.D.   On: 05/30/2018 10:31   Nm Myocar Multi W/spect W/wall Motion / Ef  Result Date: 05/31/2018 CLINICAL DATA:  85 year old with possible acute coronary syndrome. Negative troponin. EXAM: MYOCARDIAL IMAGING WITH SPECT (REST AND PHARMACOLOGIC-STRESS) GATED LEFT VENTRICULAR WALL MOTION STUDY LEFT VENTRICULAR EJECTION FRACTION TECHNIQUE: Standard myocardial SPECT imaging was performed after resting intravenous injection of 10 mCi Tc-13m tetrofosmin. Subsequently, intravenous infusion of Lexiscan was performed under the supervision of the Cardiology staff. At peak effect of the drug, 30 mCi Tc-35m tetrofosmin was injected intravenously and standard myocardial SPECT imaging was performed. Quantitative gated imaging was also performed to evaluate left ventricular wall motion, and estimate left ventricular ejection fraction. COMPARISON:  Chest radiograph 05/30/2018 FINDINGS: Perfusion: Decreased activity along the inferior wall on both the rest  and stress images. Mild reversibility along the apical segment of the inferior wall. No other areas are concerning for reversibility or ischemia. Wall Motion: Global hypokinesia and abnormal wall motion. Difficult to assess for individual wall dyskinesia. Left Ventricular Ejection Fraction: 17 % End diastolic volume 39 ml End systolic volume 32 ml IMPRESSION: 1. Concern for mild reversibility along the inferior wall in the apical segment. No other areas are concerning for reversibility. 2. Global hypokinesis and difficult to assess for wall dyskinesia. 3. Left ventricular ejection fraction 17% 4. Non invasive risk stratification*: High *2012 Appropriate Use Criteria for Coronary Revascularization Focused Update: J Am Coll Cardiol. 0354;65(6):812-751. [email or web address] Electronically Signed   By: Markus Daft M.D.   On: 05/31/2018 15:04    Cardiac Studies   N/A  Assessment   1. Principal Problem: 2.   Chest pain 3. Active Problems: 4.   Hyperlipidemia 5.   Essential hypertension 6.   GERD 7.   Plan   1. More chest pain overnight- improved with morphine. Also had bilateral arm pain. Given her abnormal myoview stress test and discordance with her echo findings, with ongoing symptoms, LHC is recommended. Long discussion with the patient and her daughter yesterday and the patient today about the risks, benefits and alternatives to cath - she is reasonably functional at home and I think would benefit from ischemia evaluation/treatment. She is agreeable to the procedure today.  Time Spent Directly with Patient:  I have spent a total of 25 minutes with the patient reviewing hospital notes, telemetry, EKGs, labs and examining the patient as well as establishing an assessment and plan that was discussed personally with the patient.  > 50% of time was spent in direct patient care.  Length of Stay:  LOS: 0 days   Pixie Casino, MD, [facility], [place] Director of the [facility] Lipid Disorders &  Cardiovascular Risk Reduction Clinic Diplomate of the American Board of Clinical Lipidology Attending Cardiologist  Direct Dial: [phone]  Fax: [phone]  Website:  [email or web address] 06/01/2018, 8:44 AM

## 2018-06-01 NOTE — Progress Notes (Signed)
[facility] PROGRESS NOTE  DE LIBMAN [id number] DOB: [birth year] DOA: 05/30/2018 PCP: Velna Hatchet, MD  Assessment/Plan:  Chest pain:Repeat chest pain last nigh radiating to both arms. Improved with morphine. Abnormal high risk myoview yesterday. Evaluated by cards who opined that she would benefit from ischemia evaluation/treatment so cath scheduled for today.  Troponinsnegative.ekg with no acute changes, echo with EF 65-70%, moderate to severe LVH and grade 1 DD. No events on tele. Not hypoxic. Chest xray without acute process. CT abdomen without acute abnormality.  Provided with aspirin. -supportive therapy -await results cath -await cardiology recommendations  Hypertension: [place].  -continue homemed  Hyperlipidemia: She is on a statin that she will continue.  GERD: With significant symptoms. She is on Protonix   Code Status: full Family Communication: none present Disposition Plan: home hopefully tomorrow   Consultants:  [facility]  Procedures:  myoview stress test 05/31/18  Cardiac cath 06/01/18  Antibiotics:    HPI/Subjective:  85 yo female with atypical chest pain and negative troponins.Hx gerd. No hx chest pain or ischemic work up. Evaluated by cards who recommended chemical stress test that was abnormal. Continues with pain last night. Cardiac cath scheduled for today Objective: Vitals:   05/31/18 2300 06/01/18 0412  BP: 126/62 (!) 145/67  Pulse: 74 76  Resp: 18 16  Temp: 98.3 F (36.8 C) 98.2 F (36.8 C)  SpO2: 98% 98%    Intake/Output Summary (Last 24 hours) at 06/01/2018 1020 Last data filed at 06/01/2018 0600 Gross per 24 hour  Intake 553.95 ml  Output -  Net 553.95 ml   Filed Weights   05/31/18 1049  Weight: 58.5 kg    Exam:   General:  Well nourished in no acute distress  Cardiovascular: rrr no mgr no LE edema  Respiratory: normal effort BS clear bilaterally no wheeze  Abdomen: soft +BS no  guarding or rebounding  Musculoskeletal: joints without swelling/erythema   Data Reviewed: Basic Metabolic Panel: Recent Labs  Lab 05/30/18 0834 06/01/18 0540  NA 140 139  K 3.5 3.7  CL 105 103  CO2 25 28  GLUCOSE 101* 98  BUN 12 17  CREATININE 0.91 1.02*  CALCIUM 9.4 9.3   Liver Function Tests: Recent Labs  Lab 05/30/18 0834  AST 20  ALT 14  ALKPHOS 52  BILITOT 0.7  PROT 6.4*  ALBUMIN 3.6   Recent Labs  Lab 05/30/18 0834  LIPASE 24   No results for input(s): AMMONIA in the last 168 hours. CBC: Recent Labs  Lab 05/30/18 0834 06/01/18 0540  WBC 2.8* 4.6  NEUTROABS 1.6*  --   HGB 11.7* 13.0  HCT 36.7 40.2  MCV 90.0 89.5  PLT 189 220   Cardiac Enzymes: Recent Labs  Lab 05/30/18 1127 05/30/18 1659 05/30/18 2312  TROPONINI <0.03 0.03* 0.03*   BNP (last 3 results) No results for input(s): BNP in the last 8760 hours.  ProBNP (last 3 results) No results for input(s): PROBNP in the last 8760 hours.  CBG: No results for input(s): GLUCAP in the last 168 hours.  Recent Results (from the past 240 hour(s))  Urine culture     Status: Abnormal   Collection Time: 05/30/18 11:30 AM  Result Value Ref Range Status   Specimen Description URINE, RANDOM  Final   Special Requests NONE  Final   Culture (A)  Final    <10,000 COLONIES/mL INSIGNIFICANT GROWTH Performed at [facility], [address] [place], [place] [address]    Report  Status 05/31/2018 FINAL  Final     Studies: Ct Abdomen Pelvis W Contrast  Result Date: 05/30/2018 CLINICAL DATA:  Abdominal pain and diarrhea. EXAM: CT ABDOMEN AND PELVIS WITH CONTRAST TECHNIQUE: Multidetector CT imaging of the abdomen and pelvis was performed using the standard protocol following bolus administration of intravenous contrast. CONTRAST:  112mL OMNIPAQUE IOHEXOL 300 MG/ML  SOLN COMPARISON:  06/15/2017. FINDINGS: Lower chest: Minimal bilateral dependent atelectasis. Hepatobiliary: Mild diffuse low density of  the liver relative to the spleen. Cholecystectomy clips. Stable intrahepatic biliary ductal dilatation, within normal limits for the age of the patient, following cholecystectomy. Pancreas: Large duodenal diverticulum in the head of the pancreas. The remainder the pancreas is unremarkable. Spleen: Normal in size without focal abnormality. Adrenals/Urinary Tract: Normal appearing adrenal glands. Left renal cysts. Tiny right renal cyst. Unremarkable urinary bladder and ureters. Stomach/Bowel: Stomach is within normal limits. Appendix appears normal. No evidence of bowel wall thickening, distention, or inflammatory changes. Vascular/Lymphatic: Atheromatous arterial calcifications without aneurysm. No enlarged lymph nodes. Reproductive: Uterus and bilateral adnexa are unremarkable. Other: Small ventral hernia containing fat above the level of the umbilicus. Musculoskeletal: Lumbar and lower thoracic spine degenerative changes. These include extensive facet degenerative changes with associated grade 1 anterolisthesis at the L4-5 level. IMPRESSION: 1. No acute abnormality. 2. Mild diffuse hepatic steatosis. 3. Stable small supraumbilical ventral hernia containing fat. Electronically Signed   By: Claudie Revering M.D.   On: 05/30/2018 10:31   Nm Myocar Multi W/spect W/wall Motion / Ef  Result Date: 05/31/2018 CLINICAL DATA:  85 year old with possible acute coronary syndrome. Negative troponin. EXAM: MYOCARDIAL IMAGING WITH SPECT (REST AND PHARMACOLOGIC-STRESS) GATED LEFT VENTRICULAR WALL MOTION STUDY LEFT VENTRICULAR EJECTION FRACTION TECHNIQUE: Standard myocardial SPECT imaging was performed after resting intravenous injection of 10 mCi Tc-78m tetrofosmin. Subsequently, intravenous infusion of Lexiscan was performed under the supervision of the Cardiology staff. At peak effect of the drug, 30 mCi Tc-85m tetrofosmin was injected intravenously and standard myocardial SPECT imaging was performed. Quantitative gated imaging  was also performed to evaluate left ventricular wall motion, and estimate left ventricular ejection fraction. COMPARISON:  Chest radiograph 05/30/2018 FINDINGS: Perfusion: Decreased activity along the inferior wall on both the rest and stress images. Mild reversibility along the apical segment of the inferior wall. No other areas are concerning for reversibility or ischemia. Wall Motion: Global hypokinesia and abnormal wall motion. Difficult to assess for individual wall dyskinesia. Left Ventricular Ejection Fraction: 17 % End diastolic volume 39 ml End systolic volume 32 ml IMPRESSION: 1. Concern for mild reversibility along the inferior wall in the apical segment. No other areas are concerning for reversibility. 2. Global hypokinesis and difficult to assess for wall dyskinesia. 3. Left ventricular ejection fraction 17% 4. Non invasive risk stratification*: High *2012 Appropriate Use Criteria for Coronary Revascularization Focused Update: J Am Coll Cardiol. 1749;44(9):675-916. http://content.airportbarriers.com.aspx?articleid=[email or web address] Electronically Signed   By: Markus Daft M.D.   On: 05/31/2018 15:04    Scheduled Meds: . [START ON 06/02/2018] aspirin EC  81 mg Oral Daily  . colesevelam  625 mg Oral Daily  . enoxaparin (LOVENOX) injection  30 mg Subcutaneous Q24H  . feeding supplement (ENSURE ENLIVE)  237 mL Oral Daily  . irbesartan  300 mg Oral Daily  . linaclotide  145 mcg Oral QAC breakfast  . multivitamin with minerals  1 tablet Oral Daily  . pantoprazole  40 mg Oral BID AC  . polyethylene glycol  17 g Oral Daily  . sodium chloride flush  3 mL  Intravenous Q12H  . sucralfate  1 g Oral TID WC & HS   Continuous Infusions: . sodium chloride    . sodium chloride 1 mL/kg/hr (06/01/18 0541)    Principal Problem:   Chest pain Active Problems:   Hyperlipidemia   Essential hypertension   GERD    Time spent: 39 minutes    Roca NP [facility]  If 7PM-7AM, please contact  night-coverage at [email or web address], password [facility] 06/01/2018, 10:20 AM  LOS: 0 days

## 2018-06-01 NOTE — Progress Notes (Signed)
At the said the patient c/o chest pain which she rated it at 5. IV morphine given and pain went away. Vital signs stable.

## 2018-06-01 NOTE — Interval H&P Note (Signed)
Cath Lab Visit (complete for each Cath Lab visit)  Clinical Evaluation Leading to the Procedure:   ACS: Yes.    Non-ACS:    Anginal Classification: CCS IV  Anti-ischemic medical therapy: Minimal Therapy (1 class of medications)  Non-Invasive Test Results: High-risk stress test findings: cardiac mortality >3%/year  Prior CABG: No previous CABG      History and Physical Interval Note:  06/01/2018 10:47 AM  Ann Riddle  has presented today for surgery, with the diagnosis of positive stress test  The various methods of treatment have been discussed with the patient and family. After consideration of risks, benefits and other options for treatment, the patient has consented to  Procedure(s): LEFT HEART CATH AND CORONARY ANGIOGRAPHY (N/A) as a surgical intervention .  The patient's history has been reviewed, patient examined, no change in status, stable for surgery.  I have reviewed the patient's chart and labs.  Questions were answered to the patient's satisfaction.     Sherren Mocha

## 2018-06-01 NOTE — Progress Notes (Signed)
On- call cardiologist notified about patient's recent cheat pain and interventions carried out. In coming staff will keep monitoring.

## 2018-06-02 ENCOUNTER — Encounter (HOSPITAL_COMMUNITY): Payer: Self-pay | Admitting: Cardiovascular Disease

## 2018-08-02 ENCOUNTER — Other Ambulatory Visit: Payer: Self-pay | Admitting: Gastroenterology

## 2018-08-13 ENCOUNTER — Other Ambulatory Visit: Payer: Self-pay | Admitting: Physician Assistant

## 2018-08-16 ENCOUNTER — Telehealth: Payer: Self-pay | Admitting: Gastroenterology

## 2018-08-16 NOTE — Telephone Encounter (Signed)
Ok to refill Carafate? Pt last seen in the office on 03-2018. Thank you.

## 2018-08-17 ENCOUNTER — Other Ambulatory Visit: Payer: Self-pay | Admitting: Gastroenterology

## 2018-08-17 MED ORDER — SUCRALFATE 1 GM/10ML PO SUSP: ORAL | 11 refills | Status: DC

## 2018-08-17 NOTE — Telephone Encounter (Signed)
Sent carafate to pharmacy

## 2018-09-02 ENCOUNTER — Telehealth: Payer: Self-pay | Admitting: Gastroenterology

## 2018-09-02 NOTE — Telephone Encounter (Signed)
The pt was advised she would need to call PCP with her symptoms.  The pt has been advised of the information and verbalized understanding.

## 2018-09-02 NOTE — Telephone Encounter (Signed)
Pt wants to know if there is anything she can rub on her body to relieve symptoms of headache and pains down her arm that radiates to her chest "like it was when I went to the hospital."  Please advise.

## 2018-09-10 ENCOUNTER — Telehealth: Payer: Self-pay | Admitting: Gastroenterology

## 2018-09-10 NOTE — Telephone Encounter (Signed)
Left message on machine to call back  

## 2018-09-13 NOTE — Telephone Encounter (Signed)
The pt states she feels better and will call if symptoms return

## 2018-10-08 ENCOUNTER — Telehealth: Payer: Self-pay | Admitting: Gastroenterology

## 2018-10-08 NOTE — Telephone Encounter (Signed)
Pt reported that she is having abdominal pain and feeling "sick in her stomach all the time."  Please advise.

## 2018-10-08 NOTE — Telephone Encounter (Signed)
The pt has continued abd discomfort and nausea.  She is taking medications as prescribed she will alter her diet over the weekend to include bland foods and full liquids.  She will call if no better on Monday

## 2018-10-14 ENCOUNTER — Telehealth: Payer: Self-pay | Admitting: Gastroenterology

## 2018-10-14 NOTE — Telephone Encounter (Signed)
Patient reports that she does not feel well.  She has had some nausea today.  She is advised to try zofran and a bland diet.  She is asked to call back if her symptoms fail to help

## 2018-10-14 NOTE — Telephone Encounter (Signed)
Pt reported that she is experiencing abdominal pain.  Please advise.

## 2018-10-21 ENCOUNTER — Telehealth: Payer: Self-pay | Admitting: Gastroenterology

## 2018-10-21 NOTE — Telephone Encounter (Signed)
The pt complains that she continues to have abd pain despite following all Dr Ardis Hughs recommendations.  Appt was made for her to speak with him on 7/1.

## 2018-11-02 ENCOUNTER — Other Ambulatory Visit: Payer: Self-pay | Admitting: Gastroenterology

## 2018-11-03 ENCOUNTER — Encounter: Payer: Self-pay | Admitting: Gastroenterology

## 2018-11-03 ENCOUNTER — Ambulatory Visit (INDEPENDENT_AMBULATORY_CARE_PROVIDER_SITE_OTHER): Payer: Medicare Other | Admitting: Gastroenterology

## 2018-11-03 VITALS — Ht 64.0 in | Wt 121.0 lb

## 2018-11-03 DIAGNOSIS — K5909 Other constipation: Secondary | ICD-10-CM

## 2018-11-03 NOTE — Progress Notes (Signed)
Review of gastrointestinal problems:  1. Chronic abdominal pain.Diagnosed as adhesive disease in the past by Dr. Lyla Son. CT scan September 2008 showed ventral hiatal hernia was stable. No acute findings in the abdomen or pelvis. January, 2010: repeat pains, went to emergency room, complete metabolic profile, amylase, lipase were normal. CT Scan January, 2010 with IV and oral contrast of abdomen and pelvis was normal. CT Scan December, 2010 done by ER, essentially normal. EGD January 2011: Mild gastritis, duodenitis. Biopsies showed no H. pylori. 2013 imaging: July [place] no clear cause of pain; July CT 2016  scan same; November CT 2016 scan same.  CT scan February 2019 large stool burden otherwise normal essentially. 2. Chronic gastroesophageal reflux disease.Esophagogastroduodenoscopy July 2002 showed no Barrett's esophagus. EGD October 2008 was normal, except for a 2 cm hiatal hernia.  3. History of colon polyps,colonoscopy October 2004 by Dr. Lyla Son showed no colon polyps. He wrote the indication for that examination was adenomatous polyps. I do not see any pathology reports in our chart indicating adenomatous polyps. Repeat colonoscopy 01/2008 by [facility] was normal to terminal ileum except for small hemorrhoids.  4. Status post remote cholecystectomy  5. Nausea: Winter, 2010;EGD January 2011, see above   This service was provided via virtual visit. Only audio was used.  The patient was located at home.  I was located in my office.  The patient did consent to this virtual visit and is aware of possible charges through their insurance for this visit.  The patient is an established patient.  My certified medical assistant, Grace Bushy, contributed to this visit by contacting the patient by phone 1 or 2 business days prior to the appointment and also followed up on the recommendations I made after the visit.  Time spent on virtual visit: 18 minutes   HPI: This is a pleasant 85 year old woman.  She  seemed surprised that I was calling for this telemedicine visit.  Evadne will be turning 89 tomorrow.  She feels bad every morning when she wakes up.  As the day progresses she feels better.  This is in a very nonspecific way.  No trouble vomiting or eating.  Bowels move well; on linzess daily and miralax.  No serious constipation, diarrhea or bleeding  Sometimes she will be walking in the house and she stops feeling like she is in a trance that lasts for a few minutes.  She intermittently has left chest pains, she takes a nitroglycerine and it helps.  She is nervous that peaches might disagree with her so she will not try them.  Apple pie goes down well and she tries to eat a lot of it  Blood work January 2020 shows normal CBC, normal complete metabolic profile except for creatinine 1.02   Chief complaint is chronic constipation  ROS: complete GI ROS as described in HPI, all other review negative.  Constitutional:  No unintentional weight loss   Past Medical History:  Diagnosis Date  . Diaphragmatic hernia without mention of obstruction or gangrene   . Diverticulosis of colon (without mention of hemorrhage)   . Esophageal reflux   . Irritable bowel syndrome   . Other and unspecified hyperlipidemia   . Unspecified essential hypertension   . Ventral hernia, unspecified, without mention of obstruction or gangrene     Past Surgical History:  Procedure Laterality Date  . CHOLECYSTECTOMY    . CORONARY ANGIOGRAPHY N/A 06/01/2018   Procedure: CORONARY ANGIOGRAPHY;  Surgeon: Sherren Mocha, MD;  Location: [facility];  Service: Cardiovascular;  Laterality: N/A;  . HIATAL HERNIA REPAIR      Current Outpatient Medications  Medication Sig Dispense Refill  . aspirin EC 81 MG tablet Take 81 mg by mouth daily.    . clonazePAM (KLONOPIN) 0.5 MG tablet Take 0.5 mg by mouth at bedtime as needed for anxiety (sleep).     . colesevelam (WELCHOL) 625 MG tablet Take 625 mg by mouth  daily.     Marland Kitchen ENSURE (ENSURE) Take 237 mLs by mouth daily.    Marland Kitchen linaclotide (LINZESS) 145 MCG CAPS capsule Take 1 capsule (145 mcg total) by mouth daily before breakfast. 30 capsule 11  . Multiple Vitamin (MULTIVITAMIN WITH MINERALS) TABS tablet Take 1 tablet by mouth daily.    . nitroGLYCERIN (NITROSTAT) 0.4 MG SL tablet Place 1 tablet (0.4 mg total) under the tongue every 5 (five) minutes as needed for chest pain. 20 tablet 12  . olmesartan (BENICAR) 40 MG tablet Take 40 mg by mouth every other day. Every Tuesday, Thursday, Saturday morning  1  . olmesartan-hydrochlorothiazide (BENICAR HCT) 40-12.5 MG per tablet Take 1 tablet by mouth every other day. every Sunday, Monday, Wednesday, Friday morning    . ondansetron (ZOFRAN) 4 MG tablet TAKE 1 TABLET BY MOUTH EVERY 8 HOURS AS NEEDED FOR NAUSEA AND VOMITING 30 tablet 1  . pantoprazole (PROTONIX) 40 MG tablet TAKE 1 TABLET (40 MG TOTAL) BY MOUTH 2 (TWO) TIMES DAILY BEFORE A MEAL. 180 tablet 2  . polyethylene glycol (MIRALAX / GLYCOLAX) packet Take 17 g by mouth daily.    . sucralfate (CARAFATE) 1 GM/10ML suspension TAKE 10 MLS (1 G TOTAL) BY MOUTH 4 (FOUR) TIMES DAILY - WITH MEALS AND AT BEDTIME. 420 mL 11   No current facility-administered medications for this visit.     Allergies as of 11/03/2018  . (No Known Allergies)    Family History  Problem Relation Age of Onset  . Breast cancer Daughter 10  . Colon cancer Neg Hx     Social History   Socioeconomic History  . Marital status: Widowed    Spouse name: Not on file  . Number of children: 4  . Years of education: Not on file  . Highest education level: Not on file  Occupational History  . Occupation: retired  Scientific laboratory technician  . Financial resource strain: Not on file  . Food insecurity    Worry: Not on file    Inability: Not on file  . Transportation needs    Medical: Not on file    Non-medical: Not on file  Tobacco Use  . Smoking status: Never Smoker  . Smokeless tobacco:  Never Used  Substance and Sexual Activity  . Alcohol use: No  . Drug use: No  . Sexual activity: Not on file  Lifestyle  . Physical activity    Days per week: Not on file    Minutes per session: Not on file  . Stress: Not on file  Relationships  . Social Herbalist on phone: Not on file    Gets together: Not on file    Attends religious service: Not on file    Active member of club or organization: Not on file    Attends meetings of clubs or organizations: Not on file    Relationship status: Not on file  . Intimate partner violence    Fear of current or ex partner: Not on file    Emotionally abused: Not on  file    Physically abused: Not on file    Forced sexual activity: Not on file  Other Topics Concern  . Not on file  Social History Narrative  . Not on file     Physical Exam: Unable to perform because this was a "telemed visit" due to current Covid-19 pandemic  Assessment and plan: 85 y.o. female with chronic constipation  Her previous chronic constipation seems to be well controlled on Linzess and MiraLAX, both once daily.  She will continue these indefinitely.  She had a lot of other vague complaints that are most likely related to her very advanced age, she is turning 89 tomorrow.  I think she needs no specific GI testing or procedures at this point.  Please see the "Patient Instructions" section for addition details about the plan.  Owens Loffler, MD [facility] 11/03/2018, 10:14 AM

## 2018-11-03 NOTE — Patient Instructions (Addendum)
She knows to call if she has any further questions or concerns.  Thank you for entrusting me with your care and choosing [facility].  Dr Ardis Hughs

## 2018-11-09 ENCOUNTER — Telehealth: Payer: Self-pay | Admitting: Gastroenterology

## 2018-11-09 NOTE — Telephone Encounter (Signed)
Spoke to patient all questions answered patient stated she felt her nausea was due to taking her medications on an empty stomach. She was advised to eat small frequent meals through the day, and that she should take her Zofran every 8 hours as prescribed. Patient voiced understanding.

## 2018-11-09 NOTE — Telephone Encounter (Signed)
Patient would like to know if she can take the med ondansetron (ZOFRAN) 4 MG  Before the 8hrs she still feels nausea

## 2018-11-12 ENCOUNTER — Telehealth: Payer: Self-pay | Admitting: Gastroenterology

## 2018-11-12 NOTE — Telephone Encounter (Signed)
Left message for patient to call back  

## 2018-11-12 NOTE — Telephone Encounter (Signed)
Patient complaining about still having abd pain.

## 2018-11-12 NOTE — Telephone Encounter (Signed)
Pt returned your call.  

## 2018-11-15 NOTE — Telephone Encounter (Signed)
Unable to reach pt will wait for pt to return call

## 2018-11-29 ENCOUNTER — Telehealth: Payer: Self-pay | Admitting: Gastroenterology

## 2018-11-29 NOTE — Telephone Encounter (Signed)
lmom for patient to call back 

## 2018-11-29 NOTE — Telephone Encounter (Signed)
Patient called said that she is still feeling nausea and the medication is not working

## 2018-11-30 NOTE — Telephone Encounter (Signed)
Spoke to patient. We went over all her medications as to how she should be taking them. Nausea  is an on going complaint this patient has. She is requesting to come in for an appointment. Scheduled with Amy as she was the first available.

## 2018-12-21 ENCOUNTER — Other Ambulatory Visit: Payer: Self-pay

## 2018-12-21 ENCOUNTER — Other Ambulatory Visit (INDEPENDENT_AMBULATORY_CARE_PROVIDER_SITE_OTHER): Payer: Medicare Other

## 2018-12-21 ENCOUNTER — Ambulatory Visit (INDEPENDENT_AMBULATORY_CARE_PROVIDER_SITE_OTHER)
Admission: RE | Admit: 2018-12-21 | Discharge: 2018-12-21 | Disposition: A | Discharge status: Home / Self Care | Payer: Medicare Other | Source: Ambulatory Visit | Attending: Physician Assistant | Admitting: Physician Assistant

## 2018-12-21 ENCOUNTER — Encounter: Payer: Self-pay | Admitting: Physician Assistant

## 2018-12-21 ENCOUNTER — Ambulatory Visit (INDEPENDENT_AMBULATORY_CARE_PROVIDER_SITE_OTHER): Payer: Medicare Other | Admitting: Physician Assistant

## 2018-12-21 VITALS — BP 134/60 | HR 78 | Temp 98.4°F | Ht 64.0 in | Wt 124.0 lb

## 2018-12-21 DIAGNOSIS — R0789 Other chest pain: Secondary | ICD-10-CM | POA: Diagnosis not present

## 2018-12-21 DIAGNOSIS — R1084 Generalized abdominal pain: Secondary | ICD-10-CM

## 2018-12-21 LAB — COMPREHENSIVE METABOLIC PANEL
ALT: 10 U/L (ref 0–35)
AST: 16 U/L (ref 0–37)
Albumin: 4.3 g/dL (ref 3.5–5.2)
Alkaline Phosphatase: 58 U/L (ref 39–117)
BUN: 13 mg/dL (ref 6–23)
CO2: 33 mEq/L — ABNORMAL HIGH (ref 19–32)
Calcium: 9.9 mg/dL (ref 8.4–10.5)
Chloride: 99 mEq/L (ref 96–112)
Creatinine, Ser: 0.96 mg/dL (ref 0.40–1.20)
GFR: 65.33 mL/min (ref >60.00)
Glucose, Bld: 88 mg/dL (ref 70–99)
Potassium: 3.6 mEq/L (ref 3.5–5.1)
Sodium: 138 mEq/L (ref 135–145)
Total Bilirubin: 0.6 mg/dL (ref 0.2–1.2)
Total Protein: 7.3 g/dL (ref 6.0–8.3)

## 2018-12-21 LAB — CBC WITH DIFFERENTIAL/PLATELET
Basophils Absolute: 0 10*3/uL (ref 0.0–0.1)
Basophils Relative: 0.8 % (ref 0.0–3.0)
Eosinophils Absolute: 0 10*3/uL (ref 0.0–0.7)
Eosinophils Relative: 1.2 % (ref 0.0–5.0)
HCT: 38 % (ref 36.0–46.0)
Hemoglobin: 12.6 g/dL (ref 12.0–15.0)
Lymphocytes Relative: 27.1 % (ref 12.0–46.0)
Lymphs Abs: 1 10*3/uL (ref 0.7–4.0)
MCHC: 33.2 g/dL (ref 30.0–36.0)
MCV: 89.9 fl (ref 78.0–100.0)
Monocytes Absolute: 0.2 10*3/uL (ref 0.1–1.0)
Monocytes Relative: 6.7 % (ref 3.0–12.0)
Neutro Abs: 2.3 10*3/uL (ref 1.4–7.7)
Neutrophils Relative %: 64.2 % (ref 43.0–77.0)
Platelets: 216 10*3/uL (ref 150.0–400.0)
RBC: 4.23 Mil/uL (ref 3.87–5.11)
RDW: 14 % (ref 11.5–15.5)
WBC: 3.6 10*3/uL — ABNORMAL LOW (ref 4.0–10.5)

## 2018-12-21 LAB — SEDIMENTATION RATE: Sed Rate: 18 mm/hr (ref 0–30)

## 2018-12-21 MED ORDER — DICLOFENAC SODIUM 1 % TD GEL: TRANSDERMAL | 3 refills | Status: DC

## 2018-12-21 NOTE — Patient Instructions (Signed)
If you are age 83 or older, your body mass index should be between 23-30. Your Body mass index is 21.28 kg/m. If this is out of the aforementioned range listed, please consider follow up with your Primary Care Provider.  If you are age 61 or younger, your body mass index should be between 19-25. Your Body mass index is 21.28 kg/m. If this is out of the aformentioned range listed, please consider follow up with your Primary Care Provider.   Your provider has requested that you go to the basement level for lab work [place] before leaving today. Press "B" on the elevator. The lab is located at the first door on the left as you exit the elevator.  We have sent the following medications to your pharmacy for you to pick up at your convenience: Voltaren Gel - apply 3-4 times daily as needed for chest wall pain.  Continue Miralax daily.  Continue Protonix 40 mg twice daily.  STOP Carafate.  Start Tylenol Arthritis - 1 every 8 hours.  Use heating pad over lower chest.  We will call you with results.  Thank you for choosing me and [facility].   Amy Esterwood, PA-C

## 2018-12-21 NOTE — Progress Notes (Signed)
Subjective:    Patient ID: Ann Riddle, female    DOB: 02/06/1922, 85 y.o.   MRN: [id number]  HPI Ann Riddle is a 85 year old African-American female, established with Dr. Ardis Hughs.  She has diagnosis of chronic abdominal pain felt secondary to adhesive disease, chronic GERD, gastritis and is status post remote cholecystectomy and diaphragmatic hernia repair as well as ventral hernia repair. She she was seen by Dr. Ardis Hughs in July 2020 with virtual visit At that time she was primarily complaining of chronic constipation and had a lot of other vague complaints that were felt secondary to her very advanced age 85.  She was continued on MiraLAX once daily and Linzess 145 mcg daily. She comes in today with persistent complaints of abdominal pain and epigastric pain which she thinks has worsened over the past 3 to 4 months.  She points to her lower sternum/subxiphoid area and said this that this area gets very sore and tender.  She is also complaining of rather generalized abdominal discomfort which she feels radiates into her back.  This is often worse at night, and she says when she first gets up in the morning she feels worse and somewhat better as the day progresses.  She has been eating okay though says her appetite is not great.  She will occasionally have nausea and has been using Zofran for that.  She had been placed on Carafate suspension which she continues to take 3-4 times daily but says she does not think it is making any difference over the past month or so and continues on twice daily Protonix.  She has been taking MiraLAX as prescribed and Linzess, and has been having fairly regular bowel movements. CT of the abdomen and pelvis was done in January 2020 which showed stable intrahepatic ductal dilation, lumbar and thoracic degenerative changes and a small supraumbilical hernia containing fat.  Review of Systems Pertinent positive and negative review of systems were noted in the above HPI section.  All  other review of systems was otherwise negative.  Outpatient Encounter Medications as of 12/21/2018  Medication Sig   aspirin EC 81 MG tablet Take 81 mg by mouth daily.   clonazePAM (KLONOPIN) 0.5 MG tablet Take 0.5 mg by mouth at bedtime as needed for anxiety (sleep).    colesevelam (WELCHOL) 625 MG tablet Take 625 mg by mouth daily.    ENSURE (ENSURE) Take 237 mLs by mouth daily.   linaclotide (LINZESS) 145 MCG CAPS capsule Take 1 capsule (145 mcg total) by mouth daily before breakfast.   Multiple Vitamin (MULTIVITAMIN WITH MINERALS) TABS tablet Take 1 tablet by mouth daily.   nitroGLYCERIN (NITROSTAT) 0.4 MG SL tablet Place 1 tablet (0.4 mg total) under the tongue every 5 (five) minutes as needed for chest pain.   olmesartan (BENICAR) 40 MG tablet Take 40 mg by mouth every other day. Every Tuesday, Thursday, Saturday morning   olmesartan-hydrochlorothiazide (BENICAR HCT) 40-12.5 MG per tablet Take 1 tablet by mouth every other day. every Sunday, Monday, Wednesday, Friday morning   ondansetron (ZOFRAN) 4 MG tablet TAKE 1 TABLET BY MOUTH EVERY 8 HOURS AS NEEDED FOR NAUSEA AND VOMITING   pantoprazole (PROTONIX) 40 MG tablet TAKE 1 TABLET (40 MG TOTAL) BY MOUTH 2 (TWO) TIMES DAILY BEFORE A MEAL.   polyethylene glycol (MIRALAX / GLYCOLAX) packet Take 17 g by mouth daily.   sucralfate (CARAFATE) 1 GM/10ML suspension TAKE 10 MLS (1 G TOTAL) BY MOUTH 4 (FOUR) TIMES DAILY - WITH MEALS AND AT BEDTIME.  diclofenac sodium (VOLTAREN) 1 % GEL Apply 3- 4 times daily as needed.   No facility-administered encounter medications on file as of 12/21/2018.    No Active Allergies Patient Active Problem List   Diagnosis Date Noted   Abnormal nuclear cardiac imaging test    Chest pain 11/13/2015   Constipation 11/10/2007   Hyperlipidemia 08/04/2007   Essential hypertension 08/04/2007   GERD 08/04/2007   IBS 08/04/2007   Social History   Socioeconomic History   Marital status:  Widowed    Spouse name: Not on file   Number of children: 4   Years of education: Not on file   Highest education level: Not on file  Occupational History   Occupation: retired  Scientist, product/process development strain: Not on file   Food insecurity    Worry: Not on file    Inability: Not on Lexicographer needs    Medical: Not on file    Non-medical: Not on file  Tobacco Use   Smoking status: Never Smoker   Smokeless tobacco: Never Used  Substance and Sexual Activity   Alcohol use: No   Drug use: No   Sexual activity: Not on file  Lifestyle   Physical activity    Days per week: Not on file    Minutes per session: Not on file   Stress: Not on file  Relationships   Social connections    Talks on phone: Not on file    Gets together: Not on file    Attends religious service: Not on file    Active member of club or organization: Not on file    Attends meetings of clubs or organizations: Not on file    Relationship status: Not on file   Intimate partner violence    Fear of current or ex partner: Not on file    Emotionally abused: Not on file    Physically abused: Not on file    Forced sexual activity: Not on file  Other Topics Concern   Not on file  Social History Narrative   Not on file    Ann Riddle's family history includes Breast cancer (age of onset: 61) in her daughter.      Objective:    Vitals:   12/21/18 1035  BP: 134/60  Pulse: 78  Temp: 98.4 F (36.9 C)    Physical Exam Well-developed well-nourished elderly African-American female in no acute distress.  Height, Weight 124, BMI 21.2.  Accompanied by her daughter  HEENT; nontraumatic normocephalic, EOMI, PE R LA, sclera anicteric.  Somewhat hard of hearing Oropharynx; not examined/wearing mass/COVID Neck; supple, no JVD Cardiovascular; regular rate and rhythm with S1-S2, no murmur rub or gallop Pulmonary; Clear bilaterally she is exquisitely tender with palpation of the  lower sternum and ribs anteriorly left greater than right.  Abdomen; soft,  nondistended, no palpable mass or hepatosplenomegaly, bowel sounds are active several incisional scars, she is mildly tender in the hypogastrium where there is a small supraumbilical ventral hernia. Rectal; not done Skin; benign exam, no jaundice rash or appreciable lesions Extremities; no clubbing cyanosis or edema skin warm and dry Neuro/Psych; alert and oriented x4, grossly nonfocal mood and affect appropriate       Assessment & Plan:   #46 85 year old African-American female with chronic constipation, chronic abdominal pain felt likely secondary to adhesive disease with several prior abdominal surgeries.  She is status post cholecystectomy, ventral hernia repair and remote diaphragmatic hernia repair.  Today on  exam she is exquisitely tender over the lower sternum and xiphoid process and bilaterally in the lower ribs left greater than right.  I think most of her pain complaint today is musculoskeletal and consistent with a costochondritis or chronic pain secondary to arthritis.  Constipation-stable on current regimen GERD stable on current regimen  Plan; continue Protonix 40 mg p.o. twice daily AC breakfast and AC dinner Discontinue Carafate Continue MiraLAX 17 g in 8 ounces of water daily Continue Linzess 145 mcg p.o. daily Discussed concern for musculoskeletal etiology for her pain and also wonder if her back may be contributing to some of her sense of abdominal discomfort. We will check thoracic spine films and rib details, rule out lytic lesion, rule out disc disease. Start Tylenol 1-2 p.o. every 8 hours for pain Advised she try moist heat over the lower chest. Have also sent Rx for Voltaren gel 1% apply 3-4 times daily as needed for chest wall pain. CBC with differential, c-Met and sed rate today.        Musab Wingard S Yazmin Locher PA-C 12/21/2018   Cc: Velna Hatchet, MD

## 2018-12-22 NOTE — Progress Notes (Signed)
I agree with the above note, plan 

## 2018-12-24 ENCOUNTER — Telehealth: Payer: Self-pay | Admitting: Gastroenterology

## 2018-12-24 NOTE — Telephone Encounter (Signed)
Tried to return call.  No answer and no voice mail

## 2018-12-27 NOTE — Telephone Encounter (Signed)
No answer no voice mail will wait for further communication from the pt.  

## 2019-01-31 ENCOUNTER — Telehealth: Payer: Self-pay | Admitting: Physician Assistant

## 2019-01-31 NOTE — Telephone Encounter (Signed)
Pt stated that she was advised to stop Carafate, but pt would like to resume taking Carafate.  Please advise.

## 2019-02-01 ENCOUNTER — Telehealth: Payer: Self-pay | Admitting: Gastroenterology

## 2019-02-01 NOTE — Telephone Encounter (Signed)
The pt has been advised she can take her carafate as prescribed.  The pt has been advised of the information and verbalized understanding.

## 2019-02-01 NOTE — Telephone Encounter (Signed)
Left message on ans mach.

## 2019-02-01 NOTE — Telephone Encounter (Signed)
Dr Ardis Hughs please advise if its ok for pt to resume Carafate. There is also a Pharmacist, community message with some concerns of Carafate causing cancer?  Please advise.

## 2019-02-01 NOTE — Telephone Encounter (Signed)
It is OK to resume carafate.

## 2019-02-14 NOTE — Telephone Encounter (Signed)
Pt's daug informed ok to restart carafate

## 2019-03-02 ENCOUNTER — Other Ambulatory Visit: Payer: Self-pay | Admitting: Gastroenterology

## 2019-03-17 ENCOUNTER — Telehealth: Payer: Self-pay | Admitting: Physician Assistant

## 2019-03-18 NOTE — Telephone Encounter (Signed)
Patient was last seen by Nicoletta Ba, PA-C. Please review in her absence. Patient calls today with complaints of abdominal discomfort that "keeps her up at night." Describes the pain as sharp, not burning, and worsened by laying down. She will stay up at night in the recliner chair as long as she can because she is more comfortable. She will adjust her bed so she is on an incline also. She feels the pain has worsened since stopping Carafate. Denies indigestion or reflux. She states her constipation is under control.

## 2019-03-18 NOTE — Telephone Encounter (Signed)
Left a message to call back to discuss

## 2019-03-18 NOTE — Telephone Encounter (Signed)
Pt returning your call

## 2019-03-20 NOTE — Telephone Encounter (Signed)
Certainly OK to resume carafate at her previous dose schedule.  Offer her new script if she needs it: carafate 10mg  suspension, QID, disp 1 month with 11 refills.  Thanks

## 2019-03-21 ENCOUNTER — Telehealth: Payer: Self-pay | Admitting: Gastroenterology

## 2019-03-21 NOTE — Telephone Encounter (Signed)
The pt has been advised and states she has carafate at home and will notify the pharmacy when refills are needed.

## 2019-03-21 NOTE — Telephone Encounter (Signed)
Returned Engineer, maintenance (IT) at [facility] but she did not answer no voicemail to leave a message.

## 2019-03-22 NOTE — Telephone Encounter (Signed)
Called Kelly at [facility] with no answer and no voicemail to leave a message.

## 2019-03-23 NOTE — Telephone Encounter (Signed)
No return call 

## 2019-03-25 ENCOUNTER — Telehealth: Payer: Self-pay | Admitting: Physician Assistant

## 2019-03-25 NOTE — Telephone Encounter (Signed)
The patient says she is on Pepcid at bedtime. She takes this and then her stomach begins hurting "every night." She is "staying up as late as I can so I will sleep through it." Pepcid is not on her list of medications however she read it off to me. Do you want her to stop Pepcid and see if she improves? Take any other medication in it's place?

## 2019-03-29 NOTE — Telephone Encounter (Signed)
Very nice lady. She stopped the Pepcid. States she has the pain off and on. Sometimes Mylanta helps. She wants to know if this is okay or should she go back on Carafate. She feels it is important to ask you.

## 2019-03-29 NOTE — Telephone Encounter (Signed)
Yes! Stop the pepcid - my note says she was on Protonix BID  Her pain is musculoskeletal pain from costochondritis -  I believe- needs to be seen by her PCP if not any better

## 2019-03-29 NOTE — Telephone Encounter (Signed)
Mylanta is fine prn if she finds it helpful- ok to stop Carafate

## 2019-03-29 NOTE — Telephone Encounter (Signed)
Patient notified. She will use Mylanta. She will call back if she finds she needs something different.

## 2019-04-05 ENCOUNTER — Telehealth: Payer: Self-pay | Admitting: Physician Assistant

## 2019-04-05 NOTE — Telephone Encounter (Signed)
Patient asking again about the pain she gets in her upper abdomen and how to treat it. She is taking Protonix twice daily. She knows she can use Mylanta at hs and PRN.  She has not tried Tylenol or heat. She will try this tonight.

## 2019-04-27 ENCOUNTER — Telehealth: Payer: Self-pay | Admitting: Physician Assistant

## 2019-04-27 NOTE — Telephone Encounter (Signed)
Patient has been given nitroglycerin sublingual for the pain she feels across her chest. She want s to know if she needs other medications like Carafate for this pain as well.  She states the Nitro does relieve the pain. She has Mylanta and that does not relieve the pain. She uses heat sometimes and that gives comfort.  Reviewed her medication list. She can name each medication and confirms she is taking as directed. No belching or burping during the spells of pain. Denies any constipation. Admits sometimes she has "a little trouble moving my bowels." Encouraged to continue to use the nitroglycerin as she has been instructed.Patient reassured.

## 2019-04-27 NOTE — Telephone Encounter (Signed)
Patient called to inquire about Carafate wants to know if she can start it up again says it helps with her chest.

## 2019-05-13 ENCOUNTER — Telehealth: Payer: Self-pay | Admitting: Physician Assistant

## 2019-05-13 NOTE — Telephone Encounter (Signed)
Pt reported nausea and chest pain.  Please advise.

## 2019-05-13 NOTE — Telephone Encounter (Signed)
Pt states she has been taking her medication as she is supposed to but she is having epigastric/burning pain in her chest. Pt reports she takes protonix bid before she eats. Pt states she was told to stop taking carafate but wonders if she should start taking this again. Please advise.

## 2019-05-16 NOTE — Telephone Encounter (Signed)
Yes, please asked that she resume the Carafate with meals and that she contact us in 1 to 2 weeks to let us know how that has helped.  Thank you

## 2019-05-16 NOTE — Telephone Encounter (Signed)
Spoke with pt and she is aware.

## 2019-05-19 ENCOUNTER — Telehealth: Payer: Self-pay | Admitting: Physician Assistant

## 2019-05-19 NOTE — Telephone Encounter (Signed)
Patient called is having a lot of nausea and has taking her medication however she states they are not helping.

## 2019-05-20 NOTE — Telephone Encounter (Signed)
Patient has continued her medications and is following the supportive care suggestions she has been given. She "still don't feel well."  Has nausea at times. Some days she deals with constipation but she is able to remedy this after a couple of days with stools softeners. She has sublingual nitro for chest pain. Last seen in August. She would like to be seen by her provider if possible. She is scheduled for her final COVID vaccine on 05/24/19 at [facility] on [address] at 5pm the same day. She agrees to an appointment here on the same day at 3:40 pm if she can "get somebody to bring me."

## 2019-05-24 ENCOUNTER — Encounter: Payer: Self-pay | Admitting: Gastroenterology

## 2019-05-24 ENCOUNTER — Ambulatory Visit: Payer: Medicare Other | Admitting: Gastroenterology

## 2019-05-24 ENCOUNTER — Other Ambulatory Visit: Payer: Self-pay

## 2019-05-24 VITALS — BP 124/48 | HR 92 | Temp 98.8°F | Ht 60.5 in | Wt 122.0 lb

## 2019-05-24 DIAGNOSIS — R1084 Generalized abdominal pain: Secondary | ICD-10-CM

## 2019-05-24 MED ORDER — SUCRALFATE 1 GM/10ML PO SUSP: ORAL | 11 refills | Status: DC

## 2019-05-24 NOTE — Progress Notes (Signed)
Review of gastrointestinal problems:  1. Chronic abdominal pain.Diagnosed as adhesive disease in the past by Dr. Lyla Son. CT scan September 2008 showed ventral hiatal hernia was stable. No acute findings in the abdomen or pelvis. January, 2010: repeat pains, went to emergency room, complete metabolic profile, amylase, lipase were normal. CT Scan January, 2010 with IV and oral contrast of abdomen and pelvis was normal. CT Scan December, 2010 done by ER, essentially normal. EGD January 2011: Mild gastritis, duodenitis. Biopsies showed no H. pylori. 2013 imaging: July [place] no clear cause of pain; July CT2016scan same; November CT 2016scan same. CT scan February 2019 large stool burden otherwise normal essentially. 2. Chronic gastroesophageal reflux disease.Esophagogastroduodenoscopy July 2002 showed no Barrett's esophagus. EGD October 2008 was normal, except for a 2 cm hiatal hernia.  3. History of colon polyps,colonoscopy October 2004 by Dr. Lyla Son showed no colon polyps. He wrote the indication for that examination was adenomatous polyps. I do not see any pathology reports in our chart indicating adenomatous polyps. Repeat colonoscopy 01/2008 by [facility] was normal to terminal ileum except for small hemorrhoids.  4. Status post remote cholecystectomy  5. Nausea: Winter, 2010;EGD January 2011, see above   HPI: This is a 86 year old woman who is here with her daughter today.  She has her usual chronic abdominal pain.  We have been discussing this for years.   She is also very nervous about having her Covid vaccination today.   ROS: complete GI ROS as described in HPI, all other review negative.  Constitutional:  No unintentional weight loss   Past Medical History:  Diagnosis Date  . Diaphragmatic hernia without mention of obstruction or gangrene   . Diverticulosis of colon (without mention of hemorrhage)   . Esophageal reflux   . Irritable bowel syndrome   . Other and unspecified  hyperlipidemia   . Unspecified essential hypertension   . Ventral hernia, unspecified, without mention of obstruction or gangrene     Past Surgical History:  Procedure Laterality Date  . CHOLECYSTECTOMY    . CORONARY ANGIOGRAPHY N/A 06/01/2018   Procedure: CORONARY ANGIOGRAPHY;  Surgeon: Sherren Mocha, MD;  Location: [place] [facility];  Service: Cardiovascular;  Laterality: N/A;  . HIATAL HERNIA REPAIR      Current Outpatient Medications  Medication Sig Dispense Refill  . aspirin EC 81 MG tablet Take 81 mg by mouth daily.    . clonazePAM (KLONOPIN) 0.5 MG tablet Take 0.5 mg by mouth at bedtime as needed for anxiety (sleep).     . colesevelam (WELCHOL) 625 MG tablet Take 625 mg by mouth daily.     . diclofenac sodium (VOLTAREN) 1 % GEL Apply 3- 4 times daily as needed. 30 g 3  . ENSURE (ENSURE) Take 237 mLs by mouth daily.    Marland Kitchen linaclotide (LINZESS) 145 MCG CAPS capsule Take 1 capsule (145 mcg total) by mouth daily before breakfast. 30 capsule 11  . Multiple Vitamin (MULTIVITAMIN WITH MINERALS) TABS tablet Take 1 tablet by mouth daily.    . nitroGLYCERIN (NITROSTAT) 0.4 MG SL tablet Place 1 tablet (0.4 mg total) under the tongue every 5 (five) minutes as needed for chest pain. 20 tablet 12  . olmesartan (BENICAR) 40 MG tablet Take 40 mg by mouth every other day. Every Tuesday, Thursday, Saturday morning  1  . olmesartan-hydrochlorothiazide (BENICAR HCT) 40-12.5 MG per tablet Take 1 tablet by mouth every other day. every Sunday, Monday, Wednesday, Friday morning    . ondansetron (ZOFRAN) 4 MG  tablet TAKE 1 TABLET BY MOUTH EVERY 8 HOURS AS NEEDED FOR NAUSEA AND VOMITING 30 tablet 1  . pantoprazole (PROTONIX) 40 MG tablet TAKE 1 TABLET (40 MG TOTAL) BY MOUTH 2 (TWO) TIMES DAILY BEFORE A MEAL. 180 tablet 2  . polyethylene glycol (MIRALAX / GLYCOLAX) packet Take 17 g by mouth daily.    . sucralfate (CARAFATE) 1 GM/10ML suspension TAKE 10 MLS (1 G TOTAL) BY MOUTH 3 (THREE) TIMES DAILY - WITH  MEALS AND AT BEDTIME. 420 mL 11   No current facility-administered medications for this visit.    Allergies as of 05/24/2019  . (No Known Allergies)    Family History  Problem Relation Age of Onset  . Breast cancer Daughter 51  . Colon cancer Neg Hx     Social History   Socioeconomic History  . Marital status: Widowed    Spouse name: Not on file  . Number of children: 4  . Years of education: Not on file  . Highest education level: Not on file  Occupational History  . Occupation: retired  Tobacco Use  . Smoking status: Never Smoker  . Smokeless tobacco: Never Used  Substance and Sexual Activity  . Alcohol use: No  . Drug use: No  . Sexual activity: Not on file  Other Topics Concern  . Not on file  Social History Narrative  . Not on file   Social Determinants of Health   Financial Resource Strain:   . Difficulty of Paying Living Expenses: Not on file  Food Insecurity:   . Worried About Charity fundraiser in the Last Year: Not on file  . Ran Out of Food in the Last Year: Not on file  Transportation Needs:   . Lack of Transportation (Medical): Not on file  . Lack of Transportation (Non-Medical): Not on file  Physical Activity:   . Days of Exercise per Week: Not on file  . Minutes of Exercise per Session: Not on file  Stress:   . Feeling of Stress : Not on file  Social Connections:   . Frequency of Communication with Friends and Family: Not on file  . Frequency of Social Gatherings with Friends and Family: Not on file  . Attends Religious Services: Not on file  . Active Member of Clubs or Organizations: Not on file  . Attends Archivist Meetings: Not on file  . Marital Status: Not on file  Intimate Partner Violence:   . Fear of Current or Ex-Partner: Not on file  . Emotionally Abused: Not on file  . Physically Abused: Not on file  . Sexually Abused: Not on file     Physical Exam: BP (!) 124/48   Pulse 92   Temp 98.8 F (37.1 C)   Ht 5'  0.5" (1.537 m) Comment: measured with flat shoes on  Wt 122 lb (55.3 kg)   BMI 23.43 kg/m  Constitutional: generally well-appearing Psychiatric: alert and oriented x3 Abdomen: soft, nontender, nondistended, no obvious ascites, no peritoneal signs, normal bowel sounds No peripheral edema noted in lower extremities  Assessment and plan: 86 y.o. female with chronic abdominal pains  She has her usual chronic abdominal pains.  These are functional plus minus musculoskeletal in origin.  I do not recommend any testing for this.  I recommended she start taking her Carafate 3 times daily with meals.  She will return on as-needed basis  I recommended strongly that she should go ahead with her Covid vaccination this afternoon.  Please  see the "Patient Instructions" section for addition details about the plan.  Owens Loffler, MD [facility] 05/24/2019, 4:21 PM

## 2019-05-24 NOTE — Patient Instructions (Addendum)
If you are age 84 or older, your body mass index should be between 23-30. Your Body mass index is 23.43 kg/m. If this is out of the aforementioned range listed, please consider follow up with your Primary Care Provider.  If you are age 103 or younger, your body mass index should be between 19-25. Your Body mass index is 23.43 kg/m. If this is out of the aformentioned range listed, please consider follow up with your Primary Care Provider.    Please take carafate suspension 10 mls three times daily.  Please have COVID vaccine today as planned.  You may follow up with our office as needed or if symptoms worsen or fail to improve.  Thank you, Dr Ardis Hughs

## 2019-05-30 ENCOUNTER — Telehealth: Payer: Self-pay | Admitting: Gastroenterology

## 2019-05-31 NOTE — Telephone Encounter (Signed)
levsin .125mg  SL, 1-2 tabs every 8 hours PRN, disp 60 with 5 refills.  Thanks

## 2019-05-31 NOTE — Telephone Encounter (Signed)
Please advise 

## 2019-05-31 NOTE — Telephone Encounter (Signed)
Called patient to inform her of medication. Phone just rang. Unable to leave message no answering machine picked up.

## 2019-06-01 MED ORDER — HYOSCYAMINE SULFATE 0.125 MG SL SUBL: 0.1250 mg | SUBLINGUAL_TABLET | Freq: Three times a day (TID) | SUBLINGUAL | 5 refills | Status: DC | PRN

## 2019-06-01 NOTE — Telephone Encounter (Signed)
Spoke with patient and informed her of medication that was sent in for her abdominal pain.

## 2019-06-02 ENCOUNTER — Telehealth: Payer: Self-pay | Admitting: Gastroenterology

## 2019-06-06 NOTE — Telephone Encounter (Signed)
Spoke with patient and informed her that she is suppose to be taking her Pantoprazole and Carafate. She states the Levsin was $51 and she was not able to afford it. She wants to know if she can drink "Apple juice or Ginger ale would that help her stomach?" She is going to try and get the money together to go and pick up the Levsin. Is there a cheaper option?

## 2019-06-14 NOTE — Telephone Encounter (Signed)
Definitely OK to drink apple juice and ginger ale.  $51 is actually pretty cheap for levsin, not sure anything else will be less.

## 2019-06-15 NOTE — Telephone Encounter (Signed)
Left message for patient to call the office

## 2019-06-16 ENCOUNTER — Telehealth: Payer: Self-pay | Admitting: Gastroenterology

## 2019-06-17 ENCOUNTER — Telehealth: Payer: Self-pay | Admitting: Gastroenterology

## 2019-06-17 NOTE — Telephone Encounter (Signed)
Patient is calling wanting to know what the Hyoscyamine used for

## 2019-06-20 NOTE — Telephone Encounter (Signed)
Patient advised that hyoscyamine should be used for pain and cramping in the stomach.  She should use on an as needed basis.  Patient verbalized understanding.  No further questions.

## 2019-06-20 NOTE — Telephone Encounter (Signed)
Ann Riddle spoke with patient on 06/20/19. hv

## 2019-06-27 ENCOUNTER — Telehealth: Payer: Self-pay | Admitting: Gastroenterology

## 2019-06-27 NOTE — Telephone Encounter (Signed)
The pt has been taking Levsin SL 0.125 mg at night for abd pain.  She states that this does not make any difference in the pain.  The pain is generalized abd pain that she has had for years. She mentioned if she could try (2) SL tablets at bedtime.  Please advise

## 2019-06-27 NOTE — Telephone Encounter (Signed)
Yes, that would be OK for her to try.

## 2019-06-27 NOTE — Telephone Encounter (Signed)
Left message on machine to call back  

## 2019-06-27 NOTE — Telephone Encounter (Signed)
Pt states that she has been taking medication as prescribed for two days but her stomach still hurts. She would like some advice.

## 2019-06-28 NOTE — Telephone Encounter (Signed)
The pt has been advised and will try 2 levsin and call back if she gets no relief. The pt has been advised of the information and verbalized understanding.

## 2019-08-04 ENCOUNTER — Other Ambulatory Visit: Payer: Self-pay

## 2019-08-04 ENCOUNTER — Telehealth: Payer: Self-pay | Admitting: Gastroenterology

## 2019-08-04 MED ORDER — PANTOPRAZOLE SODIUM 40 MG PO TBEC: 40.0000 mg | DELAYED_RELEASE_TABLET | Freq: Two times a day (BID) | ORAL | 2 refills | Status: DC

## 2019-08-04 MED ORDER — ONDANSETRON HCL 4 MG PO TABS: ORAL_TABLET | ORAL | 11 refills | Status: DC

## 2019-08-04 NOTE — Telephone Encounter (Signed)
Returned call to patient and explained that Dr Ardis Hughs had instructed her to take pantoprazole 40mg  twice daily over one year ago, and she should continue.  Explained to patient that Rx would be sent to pharmacy with directions. Rx sent to pharmacy.  Patient agreed to plan and verbalized understanding.  No further questions.

## 2019-08-05 ENCOUNTER — Telehealth: Payer: Self-pay | Admitting: Nurse Practitioner

## 2019-08-05 NOTE — Telephone Encounter (Signed)
Called answering service requesting ASAP call back. She was confused about frequency of PPI. On Rx says qday and another BID.   Advised to take q am 30 minutes prior to breakfast. If has breakthrough heartburn / reflux then call our office and we may need to increase back to BID.

## 2019-08-11 ENCOUNTER — Telehealth: Payer: Self-pay | Admitting: Gastroenterology

## 2019-08-11 NOTE — Telephone Encounter (Signed)
Pt reported that she experiences abd pain when she lies down.  Please advise.

## 2019-08-11 NOTE — Telephone Encounter (Signed)
The pt

## 2019-08-11 NOTE — Telephone Encounter (Signed)
The pt called with complaints about abd pain that is chronic.  She states she is taking carafate but forgot about taking her hyoscyamine.  She will try that and call back if she gets no relief.

## 2019-08-31 ENCOUNTER — Telehealth: Payer: Self-pay | Admitting: Gastroenterology

## 2019-08-31 MED ORDER — SUCRALFATE 1 GM/10ML PO SUSP: ORAL | 11 refills | Status: DC

## 2019-08-31 NOTE — Telephone Encounter (Signed)
Rx for carafate sent to pharmacy.

## 2019-08-31 NOTE — Telephone Encounter (Signed)
Pt requested a refill for carafate.

## 2019-10-06 IMAGING — DX DG CHEST 2V
2 series · 2 of 2 positions shown · non-contrast
Comparison: 01/06/2017

CLINICAL DATA: Chest pain

EXAM:
CHEST  2 VIEW

[chest pa]
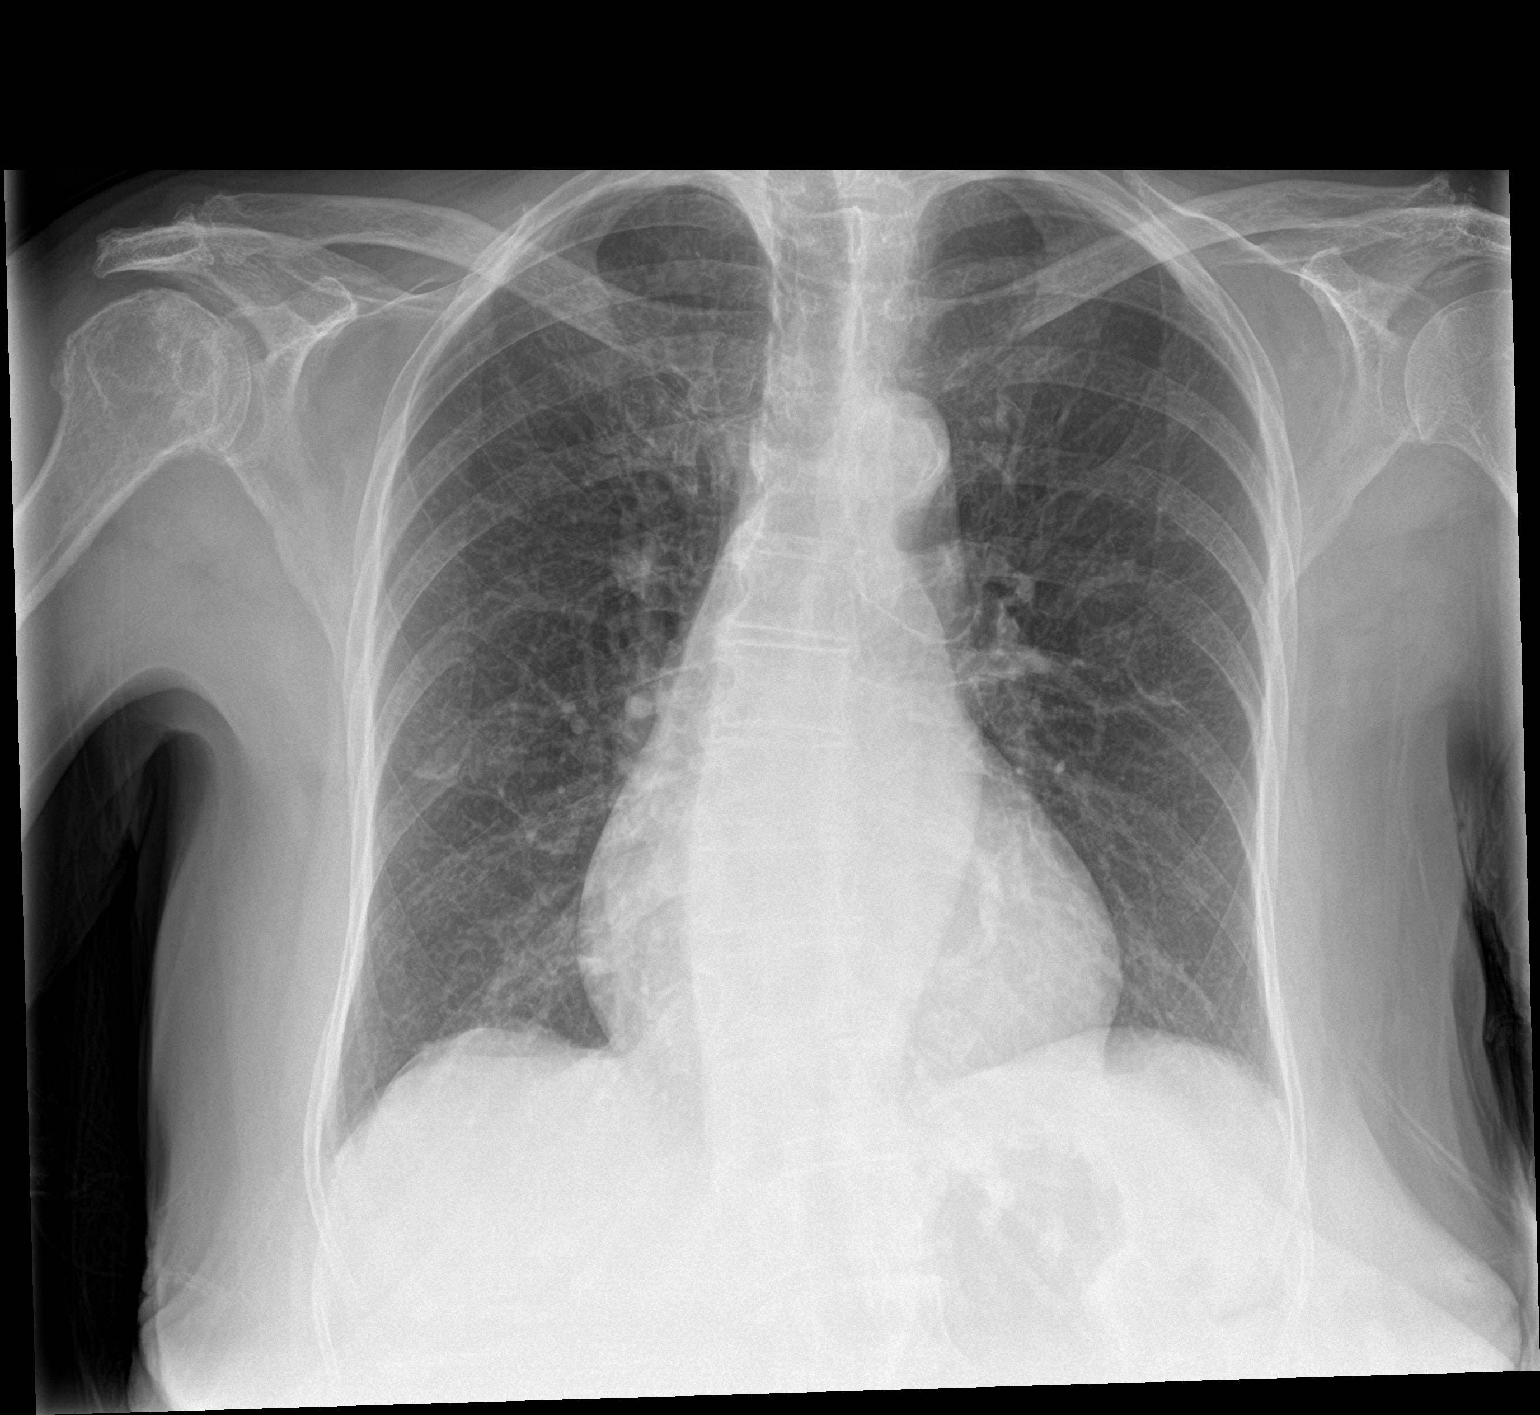

[chest lat]
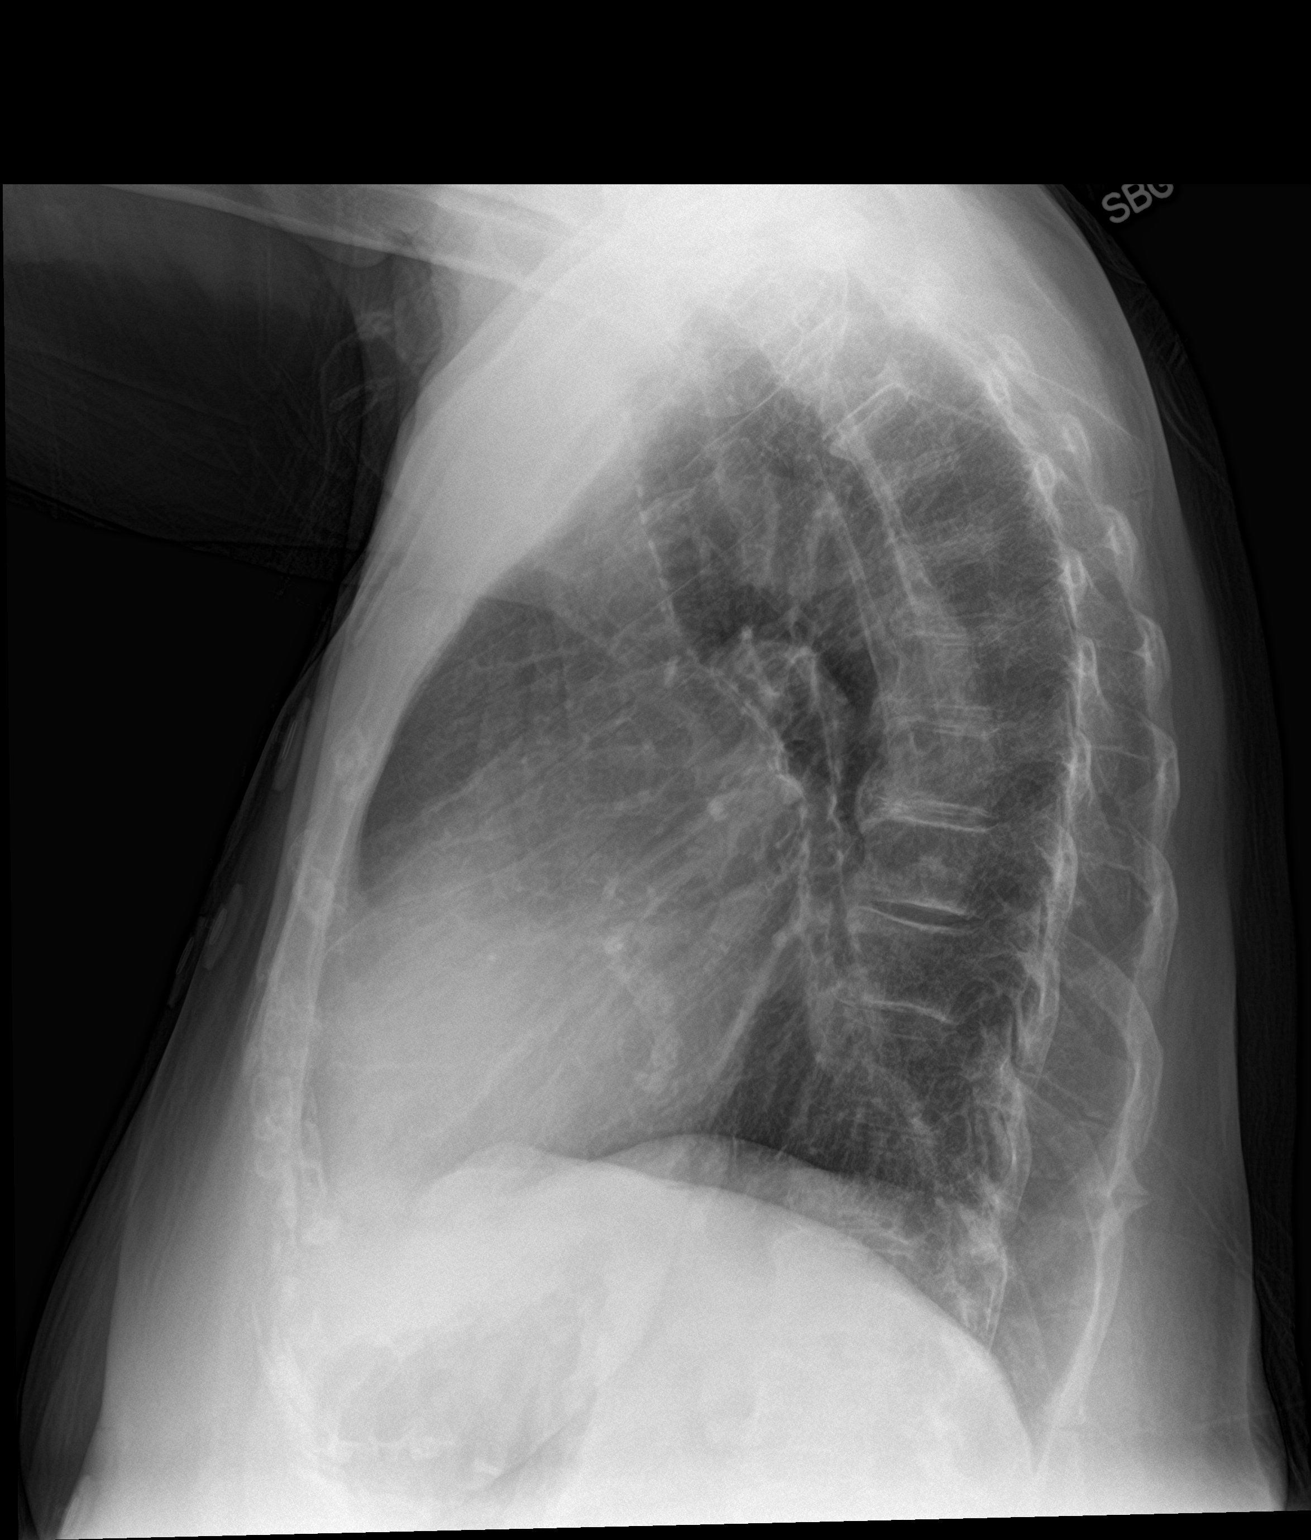

[2 of 2 positions shown; findings below may reference images not displayed]

FINDINGS: Aortic atherosclerosis. Mild cardiac enlargement. No pleural
effusion or edema. No airspace opacities identified. Degenerative
changes are noted in both acromioclavicular joints and the right
glenohumeral joint.
IMPRESSION: 1. No acute cardiopulmonary abnormalities.

## 2019-10-07 IMAGING — CT CT ABD-PELV W/ CM
2 of 5 series · 16 of 46 positions shown, 18 images · IV contrast (APPLIED)
Comparison: 04/09/2015 CT abdomen/pelvis.

CLINICAL DATA: Inpatient. Chest and epigastric pain radiating to
the back. 10 pound weight loss. Prior cholecystectomy and hiatal
hernia repair.

EXAM:
CT ABDOMEN AND PELVIS WITH CONTRAST
TECHNIQUE: Multidetector CT imaging of the abdomen and pelvis was performed
using the standard protocol following bolus administration of
intravenous contrast.
CONTRAST:  < 100 cc > [id number] IOPAMIDOL ([id number]) INJECTION
61%

[Series 3: abdomen 5.0 · axial · 0.76mm/px · z∈[-410,-65]mm · 13 of 79 slices shown, 15 images]
[im 5/79  soft-tissue]
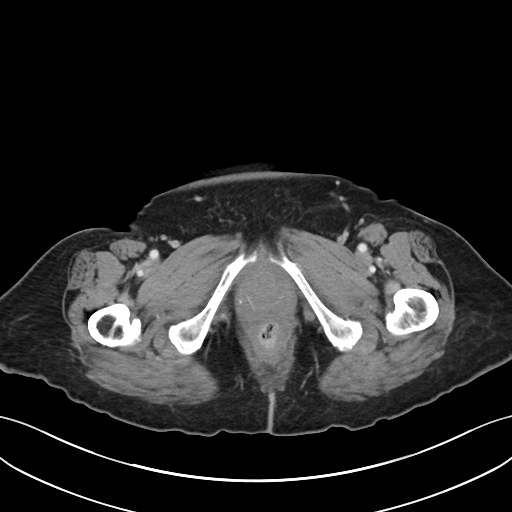
[im 5/79  bone]
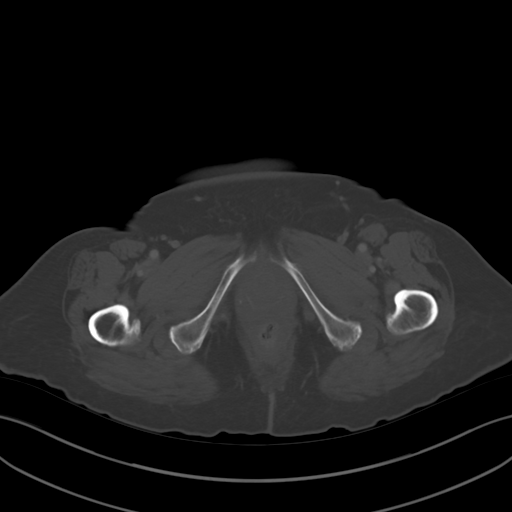
[im 10/79  soft-tissue]
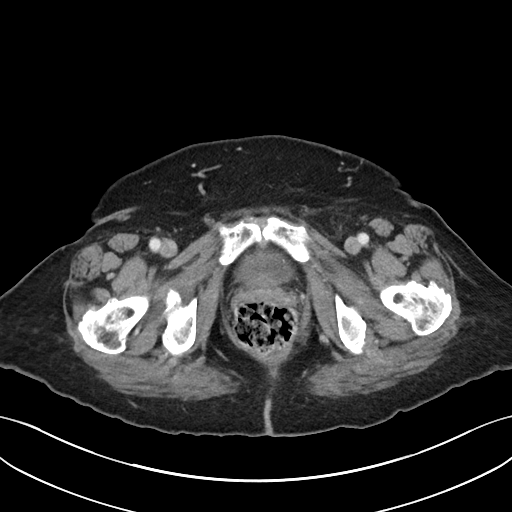
[im 15/79  soft-tissue]
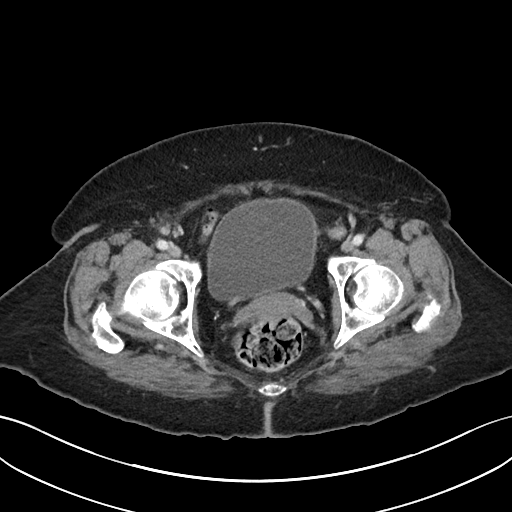
[im 25/79  soft-tissue]
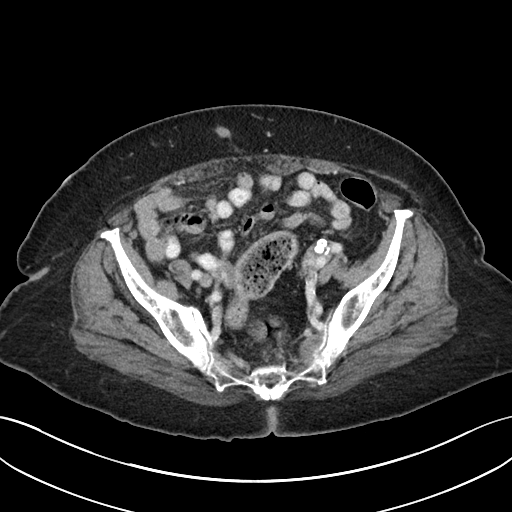
[im 30/79  soft-tissue]
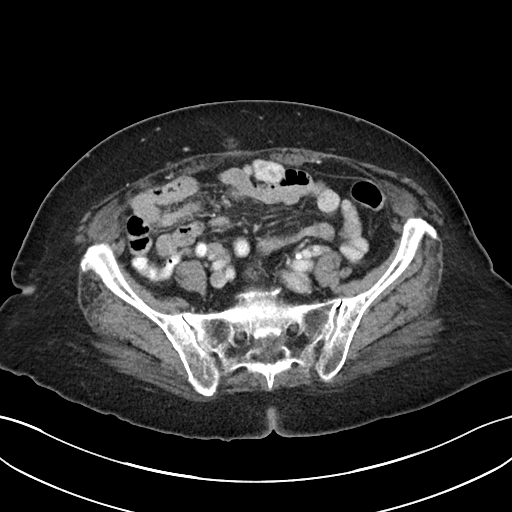
[im 35/79  soft-tissue]
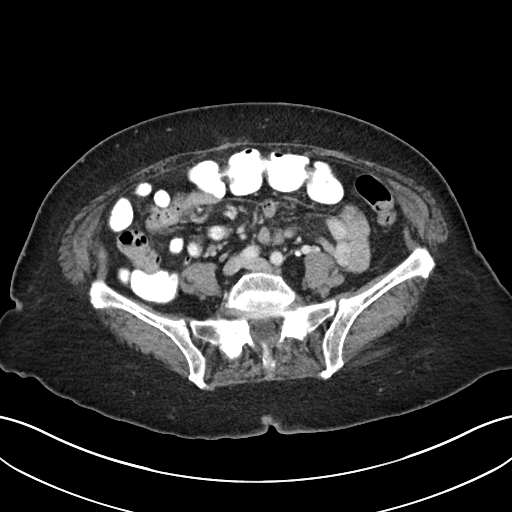
[im 40/79  soft-tissue]
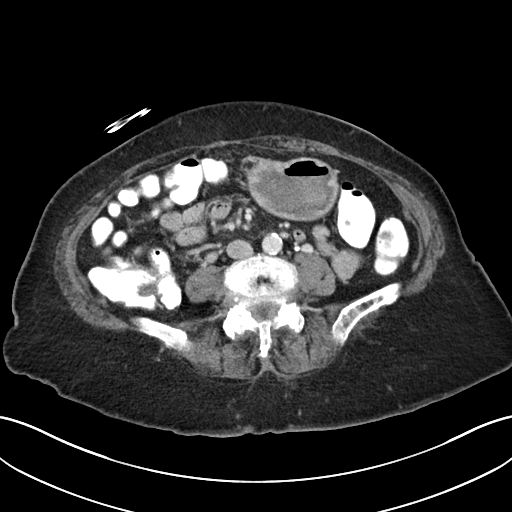
[im 44/79  soft-tissue]
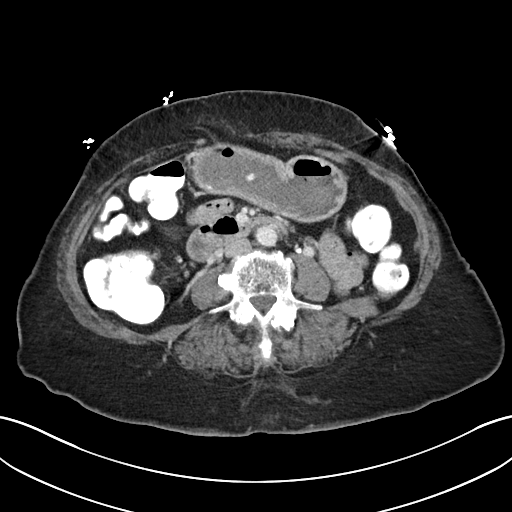
[im 49/79  soft-tissue]
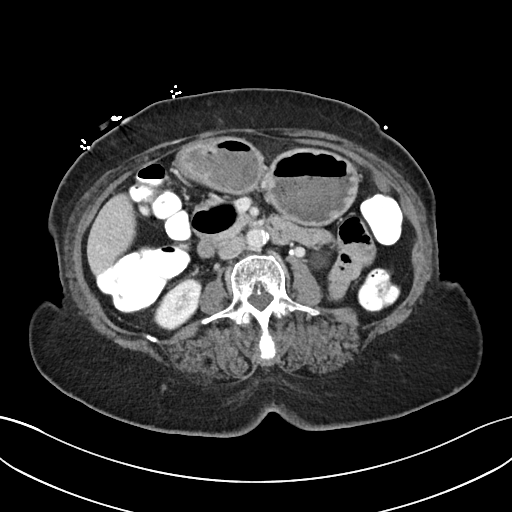
[im 49/79  bone]
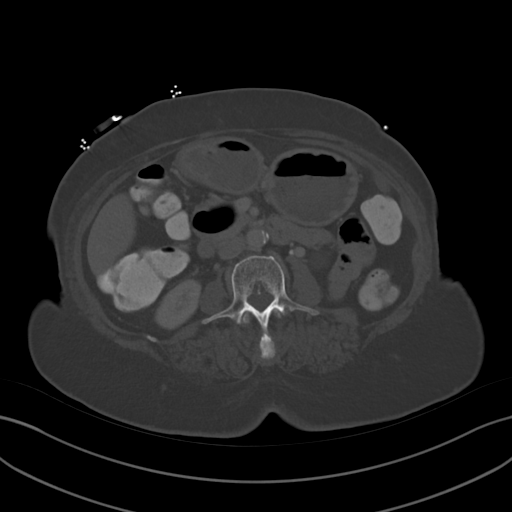
[im 54/79  soft-tissue]
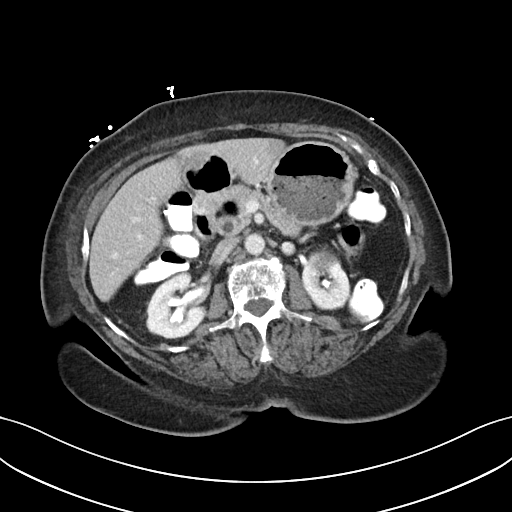
[im 64/79  soft-tissue]
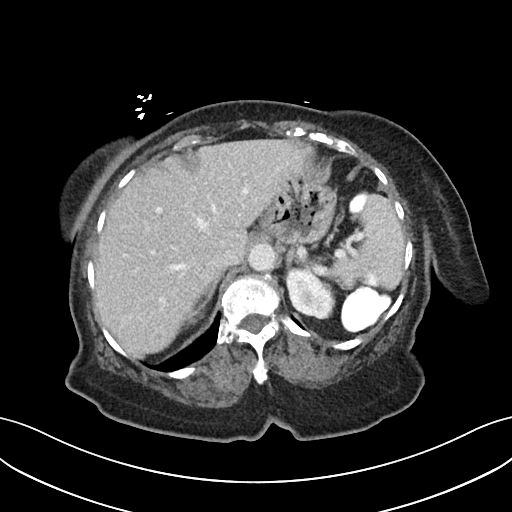
[im 69/79  soft-tissue]
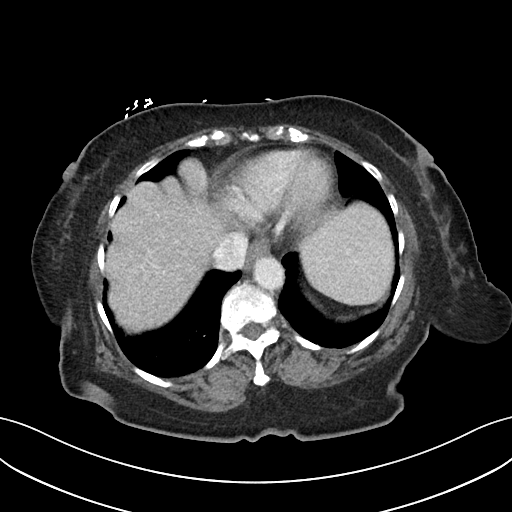
[im 74/79  soft-tissue]
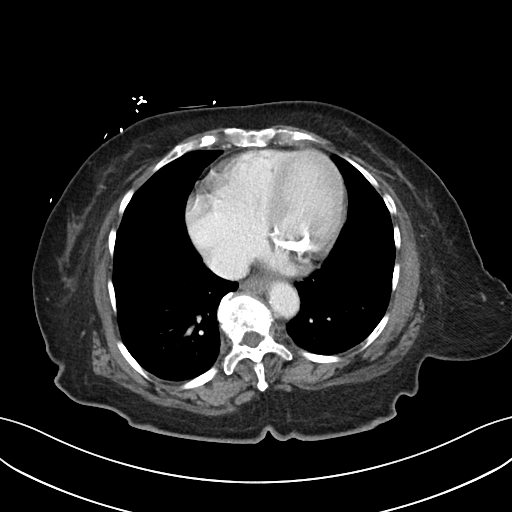

[Series 6: abdomen 3.0 mpr cor · coronal · 0.71mm/px · 3 of 101 slices shown]
[im 34/101  soft-tissue]
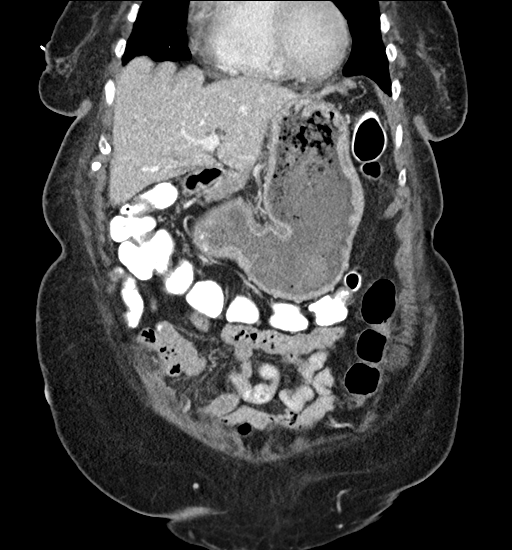
[im 45/101  soft-tissue]
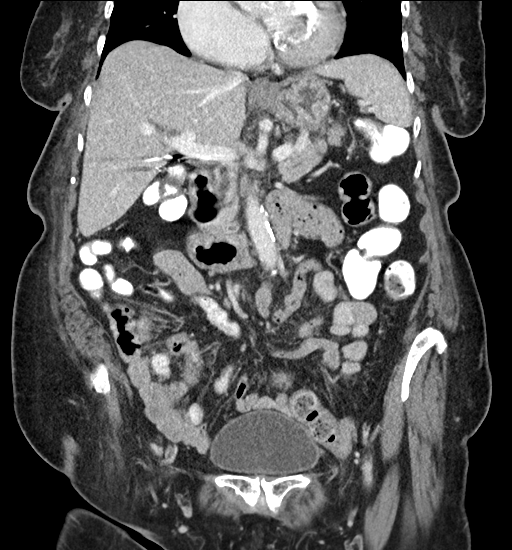
[im 56/101  soft-tissue]
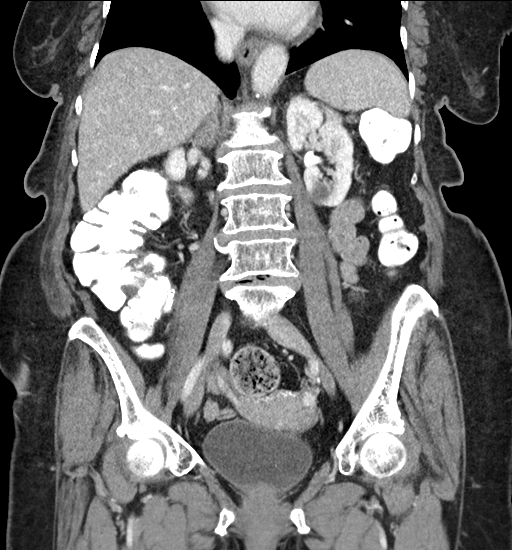

[16 of 46 positions shown; findings below may reference images not displayed]

FINDINGS: Lower chest: No significant pulmonary nodules or acute consolidative
airspace disease. Coronary atherosclerosis.

Hepatobiliary: Normal liver size. No liver mass. Cholecystectomy.
Bile ducts are within normal post cholecystectomy limits with common
bile duct diameter 6 mm. Large periampullary duodenal diverticulum.

Pancreas: Normal, with no mass or duct dilation.

Spleen: Normal size. No mass.

Adrenals/Urinary Tract: Normal adrenals. No hydronephrosis. Several
simple left renal cysts, largest 2.9 cm in the lower left kidney.
Additional subcentimeter hypodense left renal cortical lesions are
too small to characterize and require no follow-up. Normal bladder.

Stomach/Bowel: There is mild circumferential wall thickening in the
antral pyloric region of the stomach. No significant gastric
distention. Normal caliber small bowel with no small bowel wall
thickening. Normal appendix. Minimal sigmoid diverticulosis, with no
large bowel wall thickening or pericolonic fat stranding. Oral
contrast reaches the descending colon.

Vascular/Lymphatic: Atherosclerotic nonaneurysmal abdominal aorta.
Patent portal, splenic, hepatic and renal veins. No pathologically
enlarged lymph nodes in the abdomen or pelvis.

Reproductive: Stable mildly enlarged partially calcified myomatous
uterus. Stable coarse left ovarian calcifications. No adnexal
masses.

Other: No pneumoperitoneum, ascites or focal fluid collection.

Musculoskeletal: No aggressive appearing focal osseous lesions.
Moderate thoracolumbar spondylosis.
IMPRESSION: 1. Mild circumferential wall thickening in the antral pyloric region
the stomach, nonspecific, differential includes gastritis, peptic
ulcer disease or neoplasm. No significant gastric distention .
2. Large periampullary duodenum diverticulum. Cholecystectomy. Bile
ducts are within normal post cholecystectomy limits. CBD diameter 6
mm.
3. Minimal sigmoid diverticulosis.
4.  Aortic Atherosclerosis ([id number]).  Coronary atherosclerosis.
5. Mildly enlarged myomatous uterus.

## 2019-10-26 ENCOUNTER — Other Ambulatory Visit: Payer: Self-pay

## 2019-10-26 ENCOUNTER — Encounter: Payer: Self-pay | Admitting: Podiatry

## 2019-10-26 ENCOUNTER — Ambulatory Visit: Payer: Medicare Other | Admitting: Podiatry

## 2019-10-26 DIAGNOSIS — Q828 Other specified congenital malformations of skin: Secondary | ICD-10-CM | POA: Diagnosis not present

## 2019-10-26 NOTE — Progress Notes (Signed)
This patient present to the office  with chief complaint of callus developing under the outside of ball of both feet.  She says this callus has become painful walking and wearing her shoes. Patient has  Sought no  professional help.  She has been soaking her foot in warm water.  She presents to the office for treatment of her painful callus.  Vascular  Dorsalis pedis  Are weakly  palpable  B/L. Posterior tibial pulses are absent  B/L. Capillary return  WNL.  Temperature gradient is  WNL.  Skin turgor  WNL  Sensorium  Senn Weinstein monofilament wire  Diminished.. Normal tactile sensation.  Nail Exam  Patient has normal nails with no evidence of bacterial or fungal infection.  Orthopedic  Exam  Muscle tone and muscle strength  WNL.  No limitations of motion feet  B/L.  No crepitus or joint effusion noted.  Foot type is unremarkable and digits show no abnormalities.  Bony prominences are unremarkable.  Plantar flexed fifth metatarsal  B/L.  Skin  No open lesions.  Normal skin texture and turgor.  Callus/porokeratosis  sub 5th  B/L.  Asymptomatic porokeratosis sub 1 right foot.  Porokeratosis secondary plantar flexed fifth metatarsal  B/L  IE  Debride callus/porokeratosis with a # 15 blade.  .  Discussed condition with patient.  Gardiner Barefoot DPM

## 2019-10-31 ENCOUNTER — Telehealth: Payer: Self-pay | Admitting: Gastroenterology

## 2019-10-31 NOTE — Telephone Encounter (Signed)
Left message on machine to call back  

## 2019-11-01 NOTE — Telephone Encounter (Signed)
Patient is returning your call.  

## 2019-11-01 NOTE — Telephone Encounter (Signed)
The pt states she has continued abd pain that she has had for years.  We discussed the medications she is taking.  She is NOT taking the levsin that was sent in for her to take for abd pain.  She is going to start taking as prescribed and call back if needed.

## 2019-11-23 ENCOUNTER — Other Ambulatory Visit: Payer: Self-pay

## 2019-11-23 ENCOUNTER — Encounter (HOSPITAL_COMMUNITY): Payer: Self-pay | Admitting: *Deleted

## 2019-11-23 ENCOUNTER — Emergency Department (HOSPITAL_COMMUNITY)
Admission: EM | Admit: 2019-11-23 | Discharge: 2019-11-24 | Disposition: A | Discharge status: Home / Self Care | Payer: Medicare Other | Attending: Emergency Medicine | Admitting: Emergency Medicine

## 2019-11-23 DIAGNOSIS — R1084 Generalized abdominal pain: Secondary | ICD-10-CM | POA: Diagnosis not present

## 2019-11-23 DIAGNOSIS — I10 Essential (primary) hypertension: Secondary | ICD-10-CM | POA: Insufficient documentation

## 2019-11-23 DIAGNOSIS — Z7982 Long term (current) use of aspirin: Secondary | ICD-10-CM | POA: Insufficient documentation

## 2019-11-23 DIAGNOSIS — G8929 Other chronic pain: Secondary | ICD-10-CM | POA: Diagnosis not present

## 2019-11-23 DIAGNOSIS — R109 Unspecified abdominal pain: Secondary | ICD-10-CM

## 2019-11-23 LAB — CBC
HCT: 37 % (ref 36.0–46.0)
Hemoglobin: 11.8 g/dL — ABNORMAL LOW (ref 12.0–15.0)
MCH: 29.1 pg (ref 26.0–34.0)
MCHC: 31.9 g/dL (ref 30.0–36.0)
MCV: 91.4 fL (ref 80.0–100.0)
Platelets: 194 10*3/uL (ref 150–400)
RBC: 4.05 MIL/uL (ref 3.87–5.11)
RDW: 13.2 % (ref 11.5–15.5)
WBC: 3.8 10*3/uL — ABNORMAL LOW (ref 4.0–10.5)
nRBC: 0 % (ref 0.0–0.2)

## 2019-11-23 LAB — COMPREHENSIVE METABOLIC PANEL
ALT: 18 U/L (ref 0–44)
AST: 24 U/L (ref 15–41)
Albumin: 3.5 g/dL (ref 3.5–5.0)
Alkaline Phosphatase: 56 U/L (ref 38–126)
Anion gap: 9 (ref 5–15)
BUN: 21 mg/dL (ref 8–23)
CO2: 28 mmol/L (ref 22–32)
Calcium: 9.2 mg/dL (ref 8.9–10.3)
Chloride: 103 mmol/L (ref 98–111)
Creatinine, Ser: 0.94 mg/dL (ref 0.44–1.00)
GFR calc Af Amer: 59 mL/min — ABNORMAL LOW (ref >60)
GFR calc non Af Amer: 51 mL/min — ABNORMAL LOW (ref >60)
Glucose, Bld: 102 mg/dL — ABNORMAL HIGH (ref 70–99)
Potassium: 3.9 mmol/L (ref 3.5–5.1)
Sodium: 140 mmol/L (ref 135–145)
Total Bilirubin: 0.8 mg/dL (ref 0.3–1.2)
Total Protein: 7 g/dL (ref 6.5–8.1)

## 2019-11-23 LAB — LIPASE, BLOOD: Lipase: 21 U/L (ref 11–51)

## 2019-11-23 MED ORDER — SODIUM CHLORIDE 0.9% FLUSH: 3.0000 mL | Freq: Once | INTRAVENOUS | Status: DC

## 2019-11-23 NOTE — ED Notes (Signed)
Daughter aquinittia would like an update [phone]

## 2019-11-23 NOTE — ED Triage Notes (Signed)
The pt is c/o abd pain for one week  Worse for 2-3 days  No nausea vomiting or diarrhea

## 2019-11-24 ENCOUNTER — Emergency Department (HOSPITAL_COMMUNITY): Payer: Medicare Other

## 2019-11-24 LAB — URINALYSIS, ROUTINE W REFLEX MICROSCOPIC
Bilirubin Urine: NEGATIVE
Glucose, UA: NEGATIVE mg/dL
Hgb urine dipstick: NEGATIVE
Ketones, ur: NEGATIVE mg/dL
Leukocytes,Ua: NEGATIVE
Nitrite: NEGATIVE
Protein, ur: NEGATIVE mg/dL
Specific Gravity, Urine: 1.014 (ref 1.005–1.030)
pH: 7 (ref 5.0–8.0)

## 2019-11-24 MED ORDER — ALUM & MAG HYDROXIDE-SIMETH 200-200-20 MG/5ML PO SUSP
30.0000 mL | Freq: Once | ORAL | Status: AC
  Administered 2019-11-24: 30 mL via ORAL
  Filled 2019-11-24: qty 30

## 2019-11-24 MED ORDER — PANTOPRAZOLE SODIUM 40 MG IV SOLR
40.0000 mg | Freq: Once | INTRAVENOUS | Status: AC
  Administered 2019-11-24: 40 mg via INTRAVENOUS
  Filled 2019-11-24: qty 40

## 2019-11-24 MED ORDER — IOHEXOL 300 MG/ML  SOLN
100.0000 mL | Freq: Once | INTRAMUSCULAR | Status: AC | PRN
  Administered 2019-11-24: 75 mL via INTRAVENOUS

## 2019-11-24 MED ORDER — LIDOCAINE VISCOUS HCL 2 % MT SOLN
15.0000 mL | Freq: Once | OROMUCOSAL | Status: AC
  Administered 2019-11-24: 15 mL via ORAL
  Filled 2019-11-24: qty 15

## 2019-11-24 NOTE — ED Provider Notes (Signed)
[name] Provider Note   CSN: [id number] Arrival date & time: 11/23/19  1742     History Chief Complaint  Patient presents with   Abdominal Pain    Ann Riddle is a 86 y.o. female.  HPI   Patient is a 86 year old female with a medical history as noted below.  Patient states that she has a history of diaphragmatic hernia and experiences upper abdominal pain that radiates up her chest on a daily basis.  It is typically worse at night and when lying flat.  She felt that her pain was "intolerable" last night and decided to call EMS.  Patient reports 8/10 pain at the moment.  It worsens with palpation.  She is followed by Dr. Owens Loffler with gastroenterology.  She takes multiple medications for her symptoms including Protonix, famotidine, as omeprazole, Zantac, Carafate, hyoscyamine.  She notes a feeling as if "she wants to vomit" but denies any actual nausea.  She denies fevers, chills, chest pain, shortness of breath, vomiting, diarrhea, urinary changes, syncope.     Past Medical History:  Diagnosis Date   Diaphragmatic hernia without mention of obstruction or gangrene    Diverticulosis of colon (without mention of hemorrhage)    Esophageal reflux    Irritable bowel syndrome    Other and unspecified hyperlipidemia    Unspecified essential hypertension    Ventral hernia, unspecified, without mention of obstruction or gangrene     Patient Active Problem List   Diagnosis Date Noted   Porokeratosis 10/26/2019   Abnormal nuclear cardiac imaging test    Chest pain 11/13/2015   Constipation 11/10/2007   Hyperlipidemia 08/04/2007   Essential hypertension 08/04/2007   GERD 08/04/2007   IBS 08/04/2007    Past Surgical History:  Procedure Laterality Date   CHOLECYSTECTOMY     CORONARY ANGIOGRAPHY N/A 06/01/2018   Procedure: CORONARY ANGIOGRAPHY;  Surgeon: Sherren Mocha, MD;  Location: [facility];  Service:  Cardiovascular;  Laterality: N/A;   HIATAL HERNIA REPAIR       OB History   No obstetric history on file.     Family History  Problem Relation Age of Onset   Breast cancer Daughter 63   Colon cancer Neg Hx     Social History   Tobacco Use   Smoking status: Never Smoker   Smokeless tobacco: Never Used  Substance Use Topics   Alcohol use: No   Drug use: No    Home Medications Prior to Admission medications   Medication Sig Start Date End Date Taking? Authorizing Provider  Acetaminophen (TYLENOL PO) Tylenol    [provider]  amoxicillin (AMOXIL) 500 MG capsule Take 500 mg by mouth 3 (three) times daily. 10/05/19   [provider]  aspirin EC 81 MG tablet Take 81 mg by mouth daily.    [provider]  azelastine (ASTELIN) 0.1 % nasal spray azelastine 137 mcg (0.1 %) nasal spray aerosol    [provider]  betamethasone valerate ointment (VALISONE) 0.1 % betamethasone valerate 0.1 % topical ointment  APPLY THIN LAYER TO AFFECTED AREA TWICE A DAY    [provider]  clonazePAM (KLONOPIN) 0.5 MG tablet Take 0.5 mg by mouth at bedtime as needed for anxiety (sleep).     [provider]  clotrimazole-betamethasone (LOTRISONE) cream clotrimazole-betamethasone 1 %-0.05 % topical cream  APPLY TO AFFECTED AREA AND SURROUNDING AREA OF SKIN TWICE A DAY FOR 2 WEEKS    [provider]  colesevelam (WELCHOL) 625 MG tablet Take 625 mg by mouth daily.     [provider]  diclofenac sodium (VOLTAREN) 1 % GEL Apply 3- 4 times daily as needed. 12/21/18   Esterwood, Amy S, PA-C  ENSURE (ENSURE) Take 237 mLs by mouth daily.    [provider]  esomeprazole (NEXIUM) 40 MG capsule esomeprazole magnesium 40 mg capsule,delayed release    [provider]  famotidine (PEPCID) 20 MG tablet SMARTSIG:1 Tablet(s) By Mouth Every Evening 08/31/19   [provider]  hyoscyamine (LEVSIN SL) 0.125 MG SL tablet  Place 1 tablet (0.125 mg total) under the tongue every 8 (eight) hours as needed. 06/01/19   Milus Banister, MD  LINZESS 290 MCG CAPS capsule Take 290 mcg by mouth daily. 10/26/19   [provider]  magnesium citrate SOLN magnesium citrate oral solution  TAKE 1 BOTTLE ONCE FOR 1 DOSE    [provider]  Multiple Vitamin (MULTIVITAMIN WITH MINERALS) TABS tablet Take 1 tablet by mouth daily.    [provider]  nitroGLYCERIN (NITROSTAT) 0.4 MG SL tablet Place 1 tablet (0.4 mg total) under the tongue every 5 (five) minutes as needed for chest pain. 11/14/15   Thurnell Lose, MD  olmesartan (BENICAR) 40 MG tablet Take 40 mg by mouth every other day. Every Tuesday, Thursday, Saturday morning 04/27/17   [provider]  olmesartan-hydrochlorothiazide (BENICAR HCT) 40-12.5 MG per tablet Take 1 tablet by mouth every other day. every Sunday, Monday, Wednesday, Friday morning    [provider]  ondansetron (ZOFRAN) 4 MG tablet TAKE 1 TABLET BY MOUTH EVERY 8 HOURS AS NEEDED FOR NAUSEA AND VOMITING 08/04/19   Milus Banister, MD  pantoprazole (PROTONIX) 40 MG tablet Take 1 tablet (40 mg total) by mouth 2 (two) times daily before a meal. 08/04/19   Milus Banister, MD  polyethylene glycol [facility] / Floria Raveling) packet Take 17 g by mouth daily.    [provider]  ranitidine (ZANTAC) 150 MG tablet ranitidine 150 mg tablet    [provider]  sucralfate (CARAFATE) 1 GM/10ML suspension TAKE 10 MLS (1 G TOTAL) BY MOUTH 3 (THREE) TIMES DAILY - WITH MEALS AND AT BEDTIME. 08/31/19   Milus Banister, MD    Allergies    Patient has no known allergies.  Review of Systems   Review of Systems  All other systems reviewed and are negative. Ten systems reviewed and are negative for acute change, except as noted in the HPI.    Physical Exam Updated Vital Signs BP (!) 131/58 (BP Location: Right Arm)    Pulse 76    Temp 98.6 F (37 C) (Oral)    Resp 14    Ht  5' (1.524 m)    Wt 52.2 kg    SpO2 100%    BMI 22.48 kg/m   Physical Exam Vitals and nursing note reviewed.  Constitutional:      General: She is not in acute distress.    Appearance: Normal appearance. She is not ill-appearing, toxic-appearing or diaphoretic.  HENT:     Head: Normocephalic and atraumatic.     Right Ear: External ear normal.     Left Ear: External ear normal.     Nose: Nose normal.     Mouth/Throat:     Mouth: Mucous membranes are moist.     Pharynx: Oropharynx is clear. No oropharyngeal exudate or posterior oropharyngeal erythema.  Eyes:     Extraocular Movements: Extraocular movements  intact.  Cardiovascular:     Rate and Rhythm: Normal rate and regular rhythm.     Pulses: Normal pulses.     Heart sounds: Normal heart sounds. No murmur heard.  No friction rub. No gallop.   Pulmonary:     Effort: Pulmonary effort is normal. No respiratory distress.     Breath sounds: Normal breath sounds. No stridor. No wheezing, rhonchi or rales.  Abdominal:     General: Abdomen is flat. A surgical scar is present. Bowel sounds are normal. There is no distension. There are no signs of injury.     Palpations: Abdomen is soft.     Tenderness: There is generalized abdominal tenderness. There is no guarding or rebound.     Comments: Diffuse TTP noted across the abdomen.  Pain is worst along the periumbilical region and epigastrium.  Abdomen is soft.  No rebound.  No guarding.  Musculoskeletal:        General: Normal range of motion.     Cervical back: Normal range of motion and neck supple. No tenderness.  Skin:    General: Skin is warm and dry.  Neurological:     General: No focal deficit present.     Mental Status: She is alert and oriented to person, place, and time.  Psychiatric:        Mood and Affect: Mood normal.        Behavior: Behavior normal.    ED Results / Procedures / Treatments   Labs (all labs ordered are listed, but only abnormal results are  displayed) Labs Reviewed  COMPREHENSIVE METABOLIC PANEL - Abnormal; Notable for the following components:      Result Value   Glucose, Bld 102 (*)    GFR calc non Af Amer 51 (*)    GFR calc Af Amer 59 (*)    All other components within normal limits  CBC - Abnormal; Notable for the following components:   WBC 3.8 (*)    Hemoglobin 11.8 (*)    All other components within normal limits  LIPASE, BLOOD  URINALYSIS, ROUTINE W REFLEX MICROSCOPIC    EKG EKG Interpretation  Date/Time:  Wednesday November 23 2019 19:24:41 EDT Ventricular Rate:  75 PR Interval:  134 QRS Duration: 66 QT Interval:  350 QTC Calculation: 390 R Axis:   20 Text Interpretation: Sinus rhythm with Premature atrial complexes Otherwise normal ECG No significant change since last tracing Confirmed by Theotis Burrow [phone]) on 11/24/2019 1:51:36 PM  Radiology CT ABDOMEN PELVIS W CONTRAST  Result Date: 11/24/2019 CLINICAL DATA:  Acute abdominal pain, periumbilical pain for a long time, prior ventral hernia repair and cholecystectomy EXAM: CT ABDOMEN AND PELVIS WITH CONTRAST TECHNIQUE: Multidetector CT imaging of the abdomen and pelvis was performed using the standard protocol following bolus administration of intravenous contrast. Sagittal and coronal MPR images reconstructed from axial data set. CONTRAST:  93mL OMNIPAQUE IOHEXOL 300 MG/ML SOLN IV. No oral contrast. COMPARISON:  05/30/2018 FINDINGS: Lower chest: Lung bases clear. Small posterior LEFT diaphragmatic defect containing fat, Bochdalek's type. Hepatobiliary: Post cholecystectomy. Central intrahepatic biliary dilatation only minimally more prominent than on prior exam recommend correlation with LFTs. No focal hepatic mass lesion. Pancreas: Atrophic Spleen: Normal appearance Adrenals/Urinary Tract: LEFT renal cysts. Adrenal glands, kidneys, ureters, and bladder otherwise normal appearance. Stomach/Bowel: Normal appendix. Few distal colonic diverticula without evidence of  diverticulitis. Stomach and bowel loops otherwise normal appearance. Vascular/Lymphatic: Atherosclerotic calcifications aorta, iliac arteries, coronary arteries. Aorta normal caliber vascular structures patent. Prominent  veins in LEFT adnexa. No adenopathy. Reproductive: Uterus and ovaries otherwise normal appearance. Other: Small supraumbilical ventral hernia RIGHT of midline containing fat. No additional hernia seen. No free air or free fluid. Musculoskeletal: Diffuse osseous demineralization. Scattered degenerative changes lumbar spine. IMPRESSION: Small supraumbilical ventral hernia containing fat. Minimal distal colonic diverticulosis without evidence of diverticulitis. Prominent veins in LEFT adnexa, nonspecific but can be seen with pelvic congestion syndrome. Intrahepatic biliary dilatation post cholecystectomy, only minimally increased from prior study, recommend correlation with LFTs. No other intra-abdominal or intrapelvic abnormalities. Aortic Atherosclerosis (ICD10-I70.0). Electronically Signed   By: Lavonia Dana M.D.   On: 11/24/2019 14:32    Procedures Procedures (including critical care time)  Medications Ordered in ED Medications  sodium chloride flush (NS) 0.9 % injection 3 mL (has no administration in time range)  pantoprazole (PROTONIX) injection 40 mg (40 mg Intravenous Given 11/24/19 1302)  alum & mag hydroxide-simeth (MAALOX/MYLANTA) 200-200-20 MG/5ML suspension 30 mL (30 mLs Oral Given 11/24/19 1308)    And  lidocaine (XYLOCAINE) 2 % viscous mouth solution 15 mL (15 mLs Oral Given 11/24/19 1308)  iohexol (OMNIPAQUE) 300 MG/ML solution 100 mL (75 mLs Intravenous Contrast Given 11/24/19 1359)    ED Course  I have reviewed the triage vital signs and the nursing notes.  Pertinent labs & imaging results that were available during my care of the patient were reviewed by me and considered in my medical decision making (see chart for details).   Clinical Course as of Nov 24 1511  Thu  Nov 24, 2019  1231 Patient endorses 8/10 pain at this time.  Will give a GI cocktail, IV Protonix, obtain a CT scan of the abdomen and pelvis.   [LJ]  1300 Similar to prior baseline values  WBC(!): 3.8 [LJ]  1300 Similar to prior baseline values  Hemoglobin(!): 11.8 [LJ]  1300 Not hypoxic  SpO2: 100 % [LJ]  1300 Not tachycardic  Pulse Rate: 72 [LJ]  1300 Afebrile  Temp: 98.5 F (36.9 C) [LJ]  1300 Lipase: 21 [LJ]  1332 Patient given GI cocktail as well as IV Protonix. She notes moderate relief of her symptoms. CT scan is pending.   [LJ]  2595 Small supraumbilical ventral hernia containing fat.  Minimal distal colonic diverticulosis without evidence of diverticulitis.  Prominent veins in LEFT adnexa, nonspecific but can be seen with pelvic congestion syndrome.  Intrahepatic biliary dilatation post cholecystectomy, only minimally increased from prior study, recommend correlation with LFTs.  No other intra-abdominal or intrapelvic abnormalities.    CT ABDOMEN PELVIS W CONTRAST [LJ]  1437 AST: 24 [LJ]  1437 ALT: 18 [LJ]    Clinical Course User Index [LJ] Rayna Sexton, PA-C   MDM Rules/Calculators/A&P                           Pt is a 86 y.o. female that present with a history, physical exam, ED Clinical Course as noted above.   Patient has a history of GERD and presents today with acute on chronic abdominal pain that she feels is similar to her prior reflux. She was given a GI cocktail as well as IV protonix and noted moderate relief. Given her age and medical history, I obtained a CT scan of her abdomen and pelvis with findings as noted above.  Pt was discussed with and evaluated by my attending physician Dr. Gareth Morgan. Pt was discussed with her daughter. Pt will be discharged at this time with GI  follow up. Her questions were answered and she was amicable at the time of d/c. Her VSS.   Patient discharged to home/self care.  Condition at discharge:  Stable  Note: Portions of this report may have been transcribed using voice recognition software. Every effort was made to ensure accuracy; however, inadvertent computerized transcription errors may be present.    Final Clinical Impression(s) / ED Diagnoses Final diagnoses:  Abdominal pain, unspecified abdominal location   Rx / DC Orders ED Discharge Orders    None       Rayna Sexton, PA-C 11/24/19 1514    Gareth Morgan, MD 11/25/19 0010

## 2019-12-23 ENCOUNTER — Telehealth: Payer: Self-pay | Admitting: Gastroenterology

## 2019-12-23 NOTE — Telephone Encounter (Signed)
Patient would like to know if she should still be taking the Carafate medication

## 2019-12-27 ENCOUNTER — Telehealth: Payer: Self-pay | Admitting: Gastroenterology

## 2019-12-27 NOTE — Telephone Encounter (Signed)
Spoke with patient regarding her Carafate prescription, advised that she can continue to take Carafate - should be taking 3 times daily with meals and at bedtime. Pt states that this medication has really helped her.

## 2019-12-27 NOTE — Telephone Encounter (Signed)
Pt is requesting a call back from a nurse to discuss medication refills

## 2019-12-27 NOTE — Telephone Encounter (Signed)
[place], RN returned call to patient regarding questions with Carafate.  There was no discussion at that time about needing refills. Due to phone calls being made around the same time, I will not return call to patient at this time as I feel these [facility] handled patients concerns.

## 2020-01-30 ENCOUNTER — Encounter: Payer: Self-pay | Admitting: Family Medicine

## 2020-01-30 ENCOUNTER — Other Ambulatory Visit: Payer: Self-pay

## 2020-01-30 ENCOUNTER — Ambulatory Visit (INDEPENDENT_AMBULATORY_CARE_PROVIDER_SITE_OTHER): Payer: Medicare Other | Admitting: Family Medicine

## 2020-01-30 VITALS — BP 136/62 | HR 59 | Temp 96.9°F | Ht 60.5 in | Wt 120.0 lb

## 2020-01-30 DIAGNOSIS — Z23 Encounter for immunization: Secondary | ICD-10-CM | POA: Diagnosis not present

## 2020-01-30 DIAGNOSIS — F411 Generalized anxiety disorder: Secondary | ICD-10-CM | POA: Insufficient documentation

## 2020-01-30 DIAGNOSIS — K219 Gastro-esophageal reflux disease without esophagitis: Secondary | ICD-10-CM | POA: Diagnosis not present

## 2020-01-30 DIAGNOSIS — G4700 Insomnia, unspecified: Secondary | ICD-10-CM | POA: Insufficient documentation

## 2020-01-30 DIAGNOSIS — F321 Major depressive disorder, single episode, moderate: Secondary | ICD-10-CM | POA: Diagnosis not present

## 2020-01-30 DIAGNOSIS — I10 Essential (primary) hypertension: Secondary | ICD-10-CM

## 2020-01-30 MED ORDER — ESCITALOPRAM OXALATE 5 MG PO TABS: 5.0000 mg | ORAL_TABLET | Freq: Every day | ORAL | 2 refills | Status: DC

## 2020-01-30 NOTE — Patient Instructions (Addendum)
After 3 weeks on the Lexapro 5 mg -- can increase to 10 mg daily if no side effects    You are going to start a new antidepressant medication.   One of the risks of this medication is increase in suicidal thoughts.   Your suicide Action plan is as follows:  1) talk to daughter 2) Call the Suicide Hotline [phone] which is available 24 hours 3) Call the Clinic   The most common side effect is stomach upset. If this happens it means the medication is working. It should get better in 1-3 weeks.   Medication for depression and anxiety often takes 6-8 weeks to have a noticeable difference so stick with it. Also the best way for recovery is taking medication and seeing a therapist -- this is so important.    How to help anxiety and depression  1) Regular Exercise - walking, jogging, cycling, dancing, strength training - aiming for 150 minutes of exercise a week --> Yoga has been shown in research to reduce depression and anxiety -- with even just one hour long session per week  2)  Begin a Mindfulness/Meditation practice -- this can take a little as 3 minutes and is helpful for all kinds of mood issues -- You can find resources in books -- Or you can download apps like  ---- Headspace App  ---- Calm  ---- Insignt Timer ---- Stop, Breathe & Think  # With each of these Apps - you should decline the "start free trial" offer and as you search through the App should be able to access some of their free content. You can also chose to pay for the content if you find one that works well for you.   # Many of them also offer sleep specific content which may help with insomnia  3) Healthy Diet -- Avoid or decrease Caffeine -- Avoid or decrease Alcohol -- Drink plenty of water, have a balanced diet -- Avoid cigarettes and marijuana (as well as other recreational drugs)  4) Find a therapist  -- [facility] is one option. Call [phone] -- Or you can check out  www.psychologytoday.com -- you can read bios of therapists and see if they accept insurance -- Check with your insurance to see if you have coverage and who may take your insurance

## 2020-01-30 NOTE — Assessment & Plan Note (Signed)
Depressed mood since moving in with daughter, also with long hx of anxiety. Start lexapro - return in 4-6 weeks. Also with some memory issues - will assess memory once on medication

## 2020-01-30 NOTE — Assessment & Plan Note (Signed)
BP at goal. Pt on alternating dosing and only taking HCTZ a few times a week. May consider stopping this if bp remains low and just taking olmesartan with monitoring for swelling.

## 2020-01-30 NOTE — Assessment & Plan Note (Signed)
Follows with GI. Continue current medications - hyoscyamine, linzess, protonix, carafate. Appreciate GI support

## 2020-01-30 NOTE — Assessment & Plan Note (Signed)
Prescribed clonazepam which she takes several days a week. Advised reducing the use due to risk for falls and start lexapro

## 2020-01-30 NOTE — Assessment & Plan Note (Signed)
Advised limiting use of clonazepam.

## 2020-01-30 NOTE — Progress Notes (Signed)
Subjective:     Ann Riddle is a 86 y.o. female presenting for Establish Care     HPI  #Depression - no hx of depression in the past - has recently moved in with daughter - does not feel like home - has been feeling down several days - has clonazepam prescription   Feels like she is forgetful sometimes - and can remember things from before better  #HTN - alternates taking Olemsartan-HCTZ and olemsartan alone   Review of Systems   Social History   Tobacco Use  Smoking Status Never Smoker  Smokeless Tobacco Never Used        Objective:    BP Readings from Last 3 Encounters:  01/30/20 136/62  11/24/19 (!) 154/72  10/26/19 (!) 148/80   Wt Readings from Last 3 Encounters:  01/30/20 120 lb (54.4 kg)  11/23/19 115 lb 1.3 oz (52.2 kg)  05/24/19 122 lb (55.3 kg)    BP 136/62   Pulse (!) 59   Temp (!) 96.9 F (36.1 C) (Temporal)   Ht 5' 0.5" (1.537 m)   Wt 120 lb (54.4 kg)   SpO2 100%   BMI 23.05 kg/m    Physical Exam Constitutional:      General: She is not in acute distress.    Appearance: She is well-developed. She is not diaphoretic.  HENT:     Head: Normocephalic and atraumatic.     Comments: Occasional difficulty hearing    Right Ear: External ear normal.     Left Ear: Tympanic membrane and external ear normal.     Ears:     Comments: Right with cerumen present but no impaction Eyes:     Conjunctiva/sclera: Conjunctivae normal.  Cardiovascular:     Rate and Rhythm: Normal rate and regular rhythm.     Heart sounds: Murmur heard.   Pulmonary:     Effort: Pulmonary effort is normal. No respiratory distress.     Breath sounds: Normal breath sounds. No wheezing.  Musculoskeletal:     Cervical back: Neck supple.  Skin:    General: Skin is warm and dry.     Capillary Refill: Capillary refill takes less than 2 seconds.  Neurological:     Mental Status: She is alert. Mental status is at baseline.  Psychiatric:        Mood and Affect: Mood  normal.        Behavior: Behavior normal.           Assessment & Plan:   Problem List Items Addressed This Visit      Cardiovascular and Mediastinum   Essential hypertension    BP at goal. Pt on alternating dosing and only taking HCTZ a few times a week. May consider stopping this if bp remains low and just taking olmesartan with monitoring for swelling.         Digestive   GERD    Follows with GI. Continue current medications - hyoscyamine, linzess, protonix, carafate. Appreciate GI support        Other   Insomnia    Advised limiting use of clonazepam.       Current moderate episode of major depressive disorder without prior episode (HCC)    Depressed mood since moving in with daughter, also with long hx of anxiety. Start lexapro - return in 4-6 weeks. Also with some memory issues - will assess memory once on medication       Relevant Medications   escitalopram (LEXAPRO) 5 MG  tablet   Generalized anxiety disorder    Prescribed clonazepam which she takes several days a week. Advised reducing the use due to risk for falls and start lexapro      Relevant Medications   escitalopram (LEXAPRO) 5 MG tablet    Other Visit Diagnoses    Need for influenza vaccination    -  Primary   Relevant Orders   Flu Vaccine QUAD High Dose(Fluad) (Completed)       Return in about 6 weeks (around 03/12/2020).  Lesleigh Noe, MD  This visit occurred during the SARS-CoV-2 public health emergency.  Safety protocols were in place, including screening questions prior to the visit, additional usage of staff PPE, and extensive cleaning of exam room while observing appropriate contact time as indicated for disinfecting solutions.

## 2020-02-09 ENCOUNTER — Telehealth: Payer: Self-pay | Admitting: Gastroenterology

## 2020-02-09 NOTE — Telephone Encounter (Signed)
Pt states she is taking protonix 40 bid and carafate tid along with pepcid 20mg  at bedtime. Daughter states the pt is c/o reflux after she eats anything. She is wanting to know if there is something else her mother can take to help her reflux/heartburn symptoms. Please advise.

## 2020-02-09 NOTE — Telephone Encounter (Signed)
Pt called stating that medication for heartburn has not being helping lately. Pt takes carafate 3 times daily and famotidine before bedtime. She stated that she also has ranitidine and esomeprazole but she has ot taken those. SHe wants to kow what she can take or do to relieve severe heartburne, Pls call her.

## 2020-02-10 NOTE — Telephone Encounter (Signed)
Left message to call back  

## 2020-02-10 NOTE — Telephone Encounter (Signed)
Spoke with pt and her daughter and they are aware.

## 2020-02-10 NOTE — Telephone Encounter (Signed)
Patient is returning your call.  

## 2020-02-10 NOTE — Telephone Encounter (Signed)
Probably not but lets make sure she is taking PPI 20-30 min before her BF and dinner meals and we'll discuss more at OV in 4 weeks.

## 2020-02-16 ENCOUNTER — Other Ambulatory Visit: Payer: Self-pay

## 2020-02-16 MED ORDER — COLESEVELAM HCL 625 MG PO TABS: 625.0000 mg | ORAL_TABLET | Freq: Every day | ORAL | 1 refills | Status: DC

## 2020-02-17 ENCOUNTER — Other Ambulatory Visit: Payer: Self-pay

## 2020-02-17 ENCOUNTER — Ambulatory Visit: Payer: Medicare Other

## 2020-02-17 DIAGNOSIS — F411 Generalized anxiety disorder: Secondary | ICD-10-CM

## 2020-02-17 NOTE — Telephone Encounter (Signed)
Ms Luberta Mutter [facility] signed) said pt recently established with Dr Einar Pheasant and is requesting refill on clonazepam 0.5 mg; Ms Luberta Mutter said pt takes med 3 x a day as needed. I asked her if she took med tid and she said no she just takes when she needs it. Per med list has take 1 tab po at hs prn. Please advise.  Name of Medication: clonazepam 0.5 mg Name of Pharmacy:[facility]  Last Fill or Written Date and Quantity: Dr Einar Pheasant has not prescribed before Last Office Visit and Type:01/30/20 establish care  Next Office Visit and Type: 02/29/20 Last Controlled Substance Agreement Date: none Last FWY:OVZC

## 2020-02-20 ENCOUNTER — Other Ambulatory Visit: Payer: Self-pay | Admitting: *Deleted

## 2020-02-20 MED ORDER — CLONAZEPAM 0.5 MG PO TABS: 0.5000 mg | ORAL_TABLET | Freq: Every evening | ORAL | 0 refills | Status: DC | PRN

## 2020-02-20 NOTE — Telephone Encounter (Signed)
Will do controlled substance agreement at follow-up visit

## 2020-02-21 ENCOUNTER — Other Ambulatory Visit: Payer: Self-pay | Admitting: Family Medicine

## 2020-02-21 DIAGNOSIS — F321 Major depressive disorder, single episode, moderate: Secondary | ICD-10-CM

## 2020-02-21 DIAGNOSIS — F411 Generalized anxiety disorder: Secondary | ICD-10-CM

## 2020-02-22 ENCOUNTER — Encounter (HOSPITAL_COMMUNITY): Payer: Self-pay | Admitting: Emergency Medicine

## 2020-02-22 ENCOUNTER — Other Ambulatory Visit: Payer: Self-pay

## 2020-02-22 ENCOUNTER — Ambulatory Visit (HOSPITAL_COMMUNITY)
Admission: EM | Admit: 2020-02-22 | Discharge: 2020-02-22 | Disposition: A | Discharge status: Home / Self Care | Payer: Medicare Other | Attending: Family Medicine | Admitting: Family Medicine

## 2020-02-22 ENCOUNTER — Encounter: Payer: Self-pay | Admitting: Family Medicine

## 2020-02-22 DIAGNOSIS — R1030 Lower abdominal pain, unspecified: Secondary | ICD-10-CM | POA: Diagnosis present

## 2020-02-22 DIAGNOSIS — M8580 Other specified disorders of bone density and structure, unspecified site: Secondary | ICD-10-CM | POA: Insufficient documentation

## 2020-02-22 DIAGNOSIS — N898 Other specified noninflammatory disorders of vagina: Secondary | ICD-10-CM

## 2020-02-22 LAB — POCT URINALYSIS DIPSTICK, ED / UC
Bilirubin Urine: NEGATIVE
Glucose, UA: NEGATIVE mg/dL
Ketones, ur: NEGATIVE mg/dL
Nitrite: NEGATIVE
Protein, ur: NEGATIVE mg/dL
Specific Gravity, Urine: 1.02 (ref 1.005–1.030)
Urobilinogen, UA: 0.2 mg/dL (ref 0.0–1.0)
pH: 5.5 (ref 5.0–8.0)

## 2020-02-22 NOTE — Discharge Instructions (Signed)
I do not see anything obvious to treat today on exam.  Continue with the prescribed cream from your primary care provider.  Your urine looks well today, I have sent it to be cultured to confirm that you do not have a urinary tract infection which could be contributing to your abdominal pain.  Please follow up with your primary care provider in the next two weeks for recheck of your symptoms.  Return if any worsening.

## 2020-02-22 NOTE — ED Triage Notes (Addendum)
Pt c/o vaginal irritation. She states that her pcp gave her a cream to use but she feels that it has gotten worse. Pt is very hard of hearing and states her daughter dropped her off. Pt is a poor historian.

## 2020-02-22 NOTE — ED Provider Notes (Signed)
[place]    CSN: [id number] Arrival date & time: 02/22/20  1525      History   Chief Complaint Chief Complaint  Patient presents with  . vaginal irritation    (patient is hard of hearing)    HPI Ann Riddle is a 86 y.o. female.   Ann Riddle presents with complaints of vaginal irritation? Occasional itching. She has been applying a cream which has been helping. She is uncertain what type of cream it is, however. Occasional and/or mild low abdominal pain. No constipation. Endorses frequent urination, which worse overnight. No pain with urination.    ROS per HPI, negative if not otherwise mentioned.      Past Medical History:  Diagnosis Date  . Diaphragmatic hernia without mention of obstruction or gangrene   . Diverticulosis of colon (without mention of hemorrhage)   . Esophageal reflux   . Irritable bowel syndrome   . Other and unspecified hyperlipidemia   . Unspecified essential hypertension   . Ventral hernia, unspecified, without mention of obstruction or gangrene     Patient Active Problem List   Diagnosis Date Noted  . Osteopenia 02/22/2020  . Insomnia 01/30/2020  . Current moderate episode of major depressive disorder without prior episode ([place]) 01/30/2020  . Generalized anxiety disorder 01/30/2020  . Porokeratosis 10/26/2019  . Abnormal nuclear cardiac imaging test   . Chest pain 11/13/2015  . Constipation 11/10/2007  . Hyperlipidemia 08/04/2007  . Essential hypertension 08/04/2007  . GERD 08/04/2007  . IBS 08/04/2007    Past Surgical History:  Procedure Laterality Date  . CHOLECYSTECTOMY    . CORONARY ANGIOGRAPHY N/A 06/01/2018   Procedure: CORONARY ANGIOGRAPHY;  Surgeon: Sherren Mocha, MD;  Location: [facility];  Service: Cardiovascular;  Laterality: N/A;  . HIATAL HERNIA REPAIR      OB History   No obstetric history on file.      Home Medications    Prior to Admission medications   Medication Sig Start  Date End Date Taking? Authorizing Provider  Acetaminophen (TYLENOL PO) Tylenol   Yes [provider]  aspirin EC 81 MG tablet Take 81 mg by mouth daily.   Yes [provider]  clonazePAM (KLONOPIN) 0.5 MG tablet Take 1 tablet (0.5 mg total) by mouth at bedtime as needed for anxiety (sleep). 02/20/20  Yes Lesleigh Noe, MD  colesevelam [facility]) 625 MG tablet Take 1 tablet (625 mg total) by mouth daily. 02/16/20  Yes Milus Banister, MD  ENSURE (ENSURE) Take 237 mLs by mouth daily.   Yes [provider]  escitalopram (LEXAPRO) 5 MG tablet Take 1 tablet (5 mg total) by mouth daily. 01/30/20  Yes Lesleigh Noe, MD  hyoscyamine (LEVSIN SL) 0.125 MG SL tablet Place 1 tablet (0.125 mg total) under the tongue every 8 (eight) hours as needed. 06/01/19  Yes Milus Banister, MD  LINZESS 290 MCG CAPS capsule Take 290 mcg by mouth daily. 10/26/19  Yes [provider]  Multiple Vitamin (MULTIVITAMIN WITH MINERALS) TABS tablet Take 1 tablet by mouth daily.   Yes [provider]  nitroGLYCERIN (NITROSTAT) 0.4 MG SL tablet Place 1 tablet (0.4 mg total) under the tongue every 5 (five) minutes as needed for chest pain. 11/14/15  Yes Thurnell Lose, MD  olmesartan (BENICAR) 40 MG tablet Take 40 mg by mouth every other day. Every Tuesday, Thursday, Saturday morning 04/27/17  Yes [provider]  olmesartan-hydrochlorothiazide (BENICAR HCT) 40-12.5 MG per tablet  Take 1 tablet by mouth every other day. every Sunday, Monday, Wednesday, Friday morning   Yes [provider]  pantoprazole (PROTONIX) 40 MG tablet Take 1 tablet (40 mg total) by mouth 2 (two) times daily before a meal. 08/04/19  Yes Milus Banister, MD  polyethylene glycol [facility] / GLYCOLAX) packet Take 17 g by mouth daily.   Yes [provider]  sucralfate (CARAFATE) 1 GM/10ML suspension TAKE 10 MLS (1 G TOTAL) BY MOUTH 3 (THREE) TIMES DAILY - WITH MEALS AND AT BEDTIME. 08/31/19  Yes  Milus Banister, MD    Family History Family History  Problem Relation Age of Onset  . Breast cancer Daughter 72  . Colon cancer Neg Hx     Social History Social History   Tobacco Use  . Smoking status: Never Smoker  . Smokeless tobacco: Never Used  Substance Use Topics  . Alcohol use: No  . Drug use: No     Allergies   Patient has no known allergies.   Review of Systems Review of Systems   Physical Exam Triage Vital Signs ED Triage Vitals [02/22/20 1719]  Enc Vitals Group     BP      Pulse      Resp      Temp      Temp src      SpO2      Weight      Height      Head Circumference      Peak Flow      Pain Score 8     Pain Loc      Pain Edu?      Excl. in [place]?    No data found.  Updated Vital Signs There were no vitals taken for this visit.  Visual Acuity Right Eye Distance:   Left Eye Distance:   Bilateral Distance:    Right Eye Near:   Left Eye Near:    Bilateral Near:     Physical Exam Constitutional:      General: She is not in acute distress.    Appearance: She is well-developed.  Cardiovascular:     Rate and Rhythm: Normal rate.  Pulmonary:     Effort: Pulmonary effort is normal.  Abdominal:     Tenderness: There is no abdominal tenderness.  Genitourinary:    Comments: White cream product to vulva without specific rash, lesions, redness or swelling to vulva  Skin:    General: Skin is warm and dry.  Neurological:     Mental Status: She is alert and oriented to person, place, and time.      UC Treatments / Results  Labs (all labs ordered are listed, but only abnormal results are displayed) Labs Reviewed  POCT URINALYSIS DIPSTICK, ED / UC - Abnormal; Notable for the following components:      Result Value   Hgb urine dipstick SMALL (*)    Leukocytes,Ua TRACE (*)    All other components within normal limits  URINE CULTURE  CERVICOVAGINAL ANCILLARY ONLY    EKG   Radiology No results found.  Procedures Procedures  (including critical care time)  Medications Ordered in UC Medications - No data to display  Initial Impression / Assessment and Plan / UC Course  I have reviewed the triage vital signs and the nursing notes.  Pertinent labs & imaging results that were available during my care of the patient were reviewed by me and considered in my medical decision making (see chart  for details).     Vague symptoms without specific findings on physical exam. Vaginal cytology for candida and bv obtained. ua is unremarkable with culture pending. Return precautions provided. Patient verbalized understanding and agreeable to plan.   Final Clinical Impressions(s) / UC Diagnoses   Final diagnoses:  Vaginal irritation  Lower abdominal pain     Discharge Instructions     I do not see anything obvious to treat today on exam.  Continue with the prescribed cream from your primary care provider.  Your urine looks well today, I have sent it to be cultured to confirm that you do not have a urinary tract infection which could be contributing to your abdominal pain.  Please follow up with your primary care provider in the next two weeks for recheck of your symptoms.  Return if any worsening.     ED Prescriptions    None     PDMP not reviewed this encounter.   Zigmund Gottron, NP 02/24/20 [phone]

## 2020-02-23 LAB — CERVICOVAGINAL ANCILLARY ONLY
Bacterial Vaginitis (gardnerella): NEGATIVE
Candida Glabrata: NEGATIVE
Candida Vaginitis: NEGATIVE
Comment: NEGATIVE
Comment: NEGATIVE
Comment: NEGATIVE

## 2020-02-24 LAB — URINE CULTURE: Culture: 60000 — AB

## 2020-02-27 ENCOUNTER — Encounter: Payer: Self-pay | Admitting: Family Medicine

## 2020-02-29 ENCOUNTER — Ambulatory Visit (INDEPENDENT_AMBULATORY_CARE_PROVIDER_SITE_OTHER): Payer: Medicare Other | Admitting: Family Medicine

## 2020-02-29 ENCOUNTER — Other Ambulatory Visit: Payer: Self-pay

## 2020-02-29 ENCOUNTER — Encounter: Payer: Self-pay | Admitting: Family Medicine

## 2020-02-29 VITALS — BP 124/58 | HR 81 | Temp 97.4°F | Ht 61.0 in | Wt 118.5 lb

## 2020-02-29 DIAGNOSIS — G4700 Insomnia, unspecified: Secondary | ICD-10-CM | POA: Diagnosis not present

## 2020-02-29 DIAGNOSIS — I700 Atherosclerosis of aorta: Secondary | ICD-10-CM | POA: Diagnosis not present

## 2020-02-29 DIAGNOSIS — K219 Gastro-esophageal reflux disease without esophagitis: Secondary | ICD-10-CM

## 2020-02-29 DIAGNOSIS — N898 Other specified noninflammatory disorders of vagina: Secondary | ICD-10-CM | POA: Insufficient documentation

## 2020-02-29 DIAGNOSIS — F411 Generalized anxiety disorder: Secondary | ICD-10-CM

## 2020-02-29 DIAGNOSIS — I10 Essential (primary) hypertension: Secondary | ICD-10-CM

## 2020-02-29 NOTE — Assessment & Plan Note (Signed)
Unfortunately she only just got the lexapro yesterday from the pharmacy. She will continue this medication and return in 4 weeks. Long discussion about goal to decrease clonazepam use and control anxiety with SSRI. Cont lexapro 5 mg daily

## 2020-02-29 NOTE — Patient Instructions (Addendum)
#  Anxiety - Clonazepam -- only take once per day IF NEEDED - Escitalopram -- Take EVERY DAY   The goal is to decrease how often you feel anxious and how often you need Clonazepam  #Chest pain - this is likely due to your heartburn symptoms - Continue with heartburn treatments  #Vaginal knot - would recommend meeting with a GYN for evaluation   #Hypertension - Stop taking the Olmesartan-HCTZ combination pill - Start taking Olmesartan 40 mg daily

## 2020-02-29 NOTE — Assessment & Plan Note (Signed)
Cont ASA and not currently on statin due to age

## 2020-02-29 NOTE — Progress Notes (Signed)
Subjective:     Ann Riddle is a 86 y.o. female presenting for Follow-up     HPI  #Depression/anxiety - has not noticed difference - only picked up the lexapro yesterday -   #Chest pain - on the left side  - improves with reflux treatment - worse with certain foods - not present with exercise - more with food that she - will also get chest pain when bending over for a long time - notes this positional chest pain is similar to the one after eating - worse with laying down - had a cardiac work-up in Jan 2020 - this chest pain is what was bothering her then -   Review of Systems   Social History   Tobacco Use  Smoking Status Never Smoker  Smokeless Tobacco Never Used        Objective:    BP Readings from Last 3 Encounters:  02/29/20 (!) 124/58  01/30/20 136/62  11/24/19 (!) 154/72   Wt Readings from Last 3 Encounters:  02/29/20 118 lb 8 oz (53.8 kg)  01/30/20 120 lb (54.4 kg)  11/23/19 115 lb 1.3 oz (52.2 kg)    BP (!) 124/58   Pulse 81   Temp (!) 97.4 F (36.3 C) (Temporal)   Ht 5\' 1"  (1.549 m)   Wt 118 lb 8 oz (53.8 kg)   SpO2 99%   BMI 22.39 kg/m    Physical Exam Constitutional:      General: She is not in acute distress.    Appearance: She is well-developed. She is not diaphoretic.  HENT:     Right Ear: External ear normal.     Left Ear: External ear normal.     Nose: Nose normal.  Eyes:     Conjunctiva/sclera: Conjunctivae normal.  Cardiovascular:     Rate and Rhythm: Normal rate.  Pulmonary:     Effort: Pulmonary effort is normal.  Musculoskeletal:     Cervical back: Neck supple.  Skin:    General: Skin is warm and dry.     Capillary Refill: Capillary refill takes less than 2 seconds.  Neurological:     Mental Status: She is alert. Mental status is at baseline.  Psychiatric:        Mood and Affect: Mood normal.        Behavior: Behavior normal.        Cognition and Memory: Cognition is impaired.     Comments: Had to go  over the plan several times and still difficulty understanding. Daughter present           Assessment & Plan:   Problem List Items Addressed This Visit      Cardiovascular and Mediastinum   Essential hypertension    BP low and pt on alternating day regimen. Discussed stopping olmesartan-HCTZ every other day and just taking olmesartan 40 mg daily. Recheck bp in 4 weeks.       Atherosclerosis of aorta (HCC)    Cont ASA and not currently on statin due to age        Digestive   GERD    Continues to have CP. Follows with GI. Reviewed cardiology work-up from Jan 2020 - no significant changes in pain just waxes and wanes and seems most associated with food/stress. Cont GI care        Other   Insomnia - Primary    Cont clonazepam 0.5 mg at bedtime as needed. Discussed risk of sedation and falls and advised  reducing dose as tolerated      Generalized anxiety disorder    Unfortunately she only just got the lexapro yesterday from the pharmacy. She will continue this medication and return in 4 weeks. Long discussion about goal to decrease clonazepam use and control anxiety with SSRI. Cont lexapro 5 mg daily      Vaginal lesion    Pt concerned about lesion "knot" in vaginal area. Reviewed urgent care note from 10/20 with no lesions noted. [facility]. Advised f/u there as no time for repeat exam. No pain with lesion just present.           Return in about 4 weeks (around 03/28/2020) for anxiety.  Lesleigh Noe, MD  This visit occurred during the SARS-CoV-2 public health emergency.  Safety protocols were in place, including screening questions prior to the visit, additional usage of staff PPE, and extensive cleaning of exam room while observing appropriate contact time as indicated for disinfecting solutions.

## 2020-02-29 NOTE — Assessment & Plan Note (Signed)
Continues to have CP. Follows with GI. Reviewed cardiology work-up from Jan 2020 - no significant changes in pain just waxes and wanes and seems most associated with food/stress. Cont GI care

## 2020-02-29 NOTE — Assessment & Plan Note (Signed)
Pt concerned about lesion "knot" in vaginal area. Reviewed urgent care note from 10/20 with no lesions noted. She has a GYN with [facility]. Advised f/u there as no time for repeat exam. No pain with lesion just present.

## 2020-02-29 NOTE — Assessment & Plan Note (Signed)
Cont clonazepam 0.5 mg at bedtime as needed. Discussed risk of sedation and falls and advised reducing dose as tolerated

## 2020-02-29 NOTE — Assessment & Plan Note (Signed)
BP low and pt on alternating day regimen. Discussed stopping olmesartan-HCTZ every other day and just taking olmesartan 40 mg daily. Recheck bp in 4 weeks.

## 2020-03-16 ENCOUNTER — Ambulatory Visit: Payer: Medicare Other | Admitting: Gastroenterology

## 2020-03-20 ENCOUNTER — Ambulatory Visit: Payer: Medicare Other | Admitting: Gastroenterology

## 2020-03-20 ENCOUNTER — Encounter: Payer: Self-pay | Admitting: Gastroenterology

## 2020-03-20 VITALS — BP 112/60 | HR 91 | Ht 61.0 in | Wt 115.0 lb

## 2020-03-20 DIAGNOSIS — K219 Gastro-esophageal reflux disease without esophagitis: Secondary | ICD-10-CM | POA: Diagnosis not present

## 2020-03-20 MED ORDER — FAMOTIDINE 20 MG PO TABS
20.0000 mg | ORAL_TABLET | Freq: Every day | ORAL | 3 refills | Status: DC
Start: 2020-03-20 — End: 2020-09-04

## 2020-03-20 MED ORDER — FAMOTIDINE 20 MG PO TABS
20.0000 mg | ORAL_TABLET | Freq: Every day | ORAL | Status: DC
Start: 2020-03-20 — End: 2020-03-20

## 2020-03-20 NOTE — Progress Notes (Signed)
Review of gastrointestinal problems:  1. Chronic abdominal pain.Diagnosed as adhesive disease in the past by Dr. Lyla Son. CT scan September 2008 showed ventral hiatal hernia was stable. No acute findings in the abdomen or pelvis. January, 2010: repeat pains, went to emergency room, complete metabolic profile, amylase, lipase were normal. CT Scan January, 2010 with IV and oral contrast of abdomen and pelvis was normal. CT Scan December, 2010 done by ER, essentially normal. EGD January 2011: Mild gastritis, duodenitis. Biopsies showed no H. pylori. 2013 imaging: July [place] no clear cause of pain; July CT2016scan same; November CT 2016scan same. CT scan February 2019 large stool burden otherwise normal essentially.  CT scan January 2020 done for abdominal pain and diarrhea showed mild diffuse hepatic steatosis and "stable small supraumbilical ventral hernia containing fat" 2. Chronic gastroesophageal reflux disease.Esophagogastroduodenoscopy July 2002 showed no Barrett's esophagus. EGD October 2008 was normal, except for a 2 cm hiatal hernia.  3. History of colon polyps,colonoscopy October 2004 by Dr. Lyla Son showed no colon polyps. He wrote the indication for that examination was adenomatous polyps. I do not see any pathology reports in our chart indicating adenomatous polyps. Repeat colonoscopy 01/2008 by [facility] was normal to terminal ileum except for small hemorrhoids.  4. Status post remote cholecystectomy  5. Nausea: Winter, 2010;EGD January 2011, see above   HPI: This is a 86 year old woman who is here with her daughter today   I last saw her here in our office about 10 months ago.  She was having abdominal pains consistent with her usual chronic abdominal pains which we have intermittently evaluated over the past 15 years or so.  I did not recommend any further dedicated testing and she was to return to see me on as-needed basis  She underwent another CT scan abdomen pelvis with IV and  oral contrast July 2021 for "acute abdominal pain, periumbilical pain for a long time, prior ventral hernia repair and cholecystectomy".  Findings included "small supraumbilical ventral hernia containing fat.  Colonic diverticulosis without diverticulitis. Prominent veins in left adnexa nonspecific but can be seen with pelvic congestion syndrome.  Intrahepatic biliary dilation postcholecystectomy only mildly increased from prior study, recommend correlation with LFTs".  Her LFTs at that time were completely normal.  Today she tells me that she is having trouble with acid.  She has burning in her chest, especially at night.  Especially when laying down.  She currently takes proton pump inhibitor Protonix 40 mg shortly before breakfast and shortly before dinner.  She takes Carafate 4 times a day.  She is also bothered by bloating.  She does not have dysphagia.  Her weight is stable  ROS: complete GI ROS as described in HPI, all other review negative.  Constitutional:  No unintentional weight loss   Past Medical History:  Diagnosis Date   Diaphragmatic hernia without mention of obstruction or gangrene    Diverticulosis of colon (without mention of hemorrhage)    Esophageal reflux    Irritable bowel syndrome    Other and unspecified hyperlipidemia    Unspecified essential hypertension    Ventral hernia, unspecified, without mention of obstruction or gangrene     Past Surgical History:  Procedure Laterality Date   CHOLECYSTECTOMY     CORONARY ANGIOGRAPHY N/A 06/01/2018   Procedure: CORONARY ANGIOGRAPHY;  Surgeon: Sherren Mocha, MD;  Location: [facility];  Service: Cardiovascular;  Laterality: N/A;   HIATAL HERNIA REPAIR      Current Outpatient Medications  Medication Sig  Dispense Refill   Acetaminophen (TYLENOL PO) Tylenol     aspirin EC 81 MG tablet Take 81 mg by mouth daily.     clonazePAM (KLONOPIN) 0.5 MG tablet Take 1 tablet (0.5 mg total) by mouth at bedtime as  needed for anxiety (sleep). 30 tablet 0   colesevelam (WELCHOL) 625 MG tablet Take 1 tablet (625 mg total) by mouth daily. 90 tablet 1   ENSURE (ENSURE) Take 237 mLs by mouth daily.     escitalopram (LEXAPRO) 5 MG tablet Take 1 tablet (5 mg total) by mouth daily. 30 tablet 2   hyoscyamine (LEVSIN SL) 0.125 MG SL tablet Place 1 tablet (0.125 mg total) under the tongue every 8 (eight) hours as needed. 60 tablet 5   LINZESS 290 MCG CAPS capsule Take 290 mcg by mouth daily.     Multiple Vitamin (MULTIVITAMIN WITH MINERALS) TABS tablet Take 1 tablet by mouth daily.     nitroGLYCERIN (NITROSTAT) 0.4 MG SL tablet Place 1 tablet (0.4 mg total) under the tongue every 5 (five) minutes as needed for chest pain. 20 tablet 12   olmesartan (BENICAR) 40 MG tablet Take 40 mg by mouth daily. Every Tuesday, Thursday, Saturday morning  1   pantoprazole (PROTONIX) 40 MG tablet Take 1 tablet (40 mg total) by mouth 2 (two) times daily before a meal. 180 tablet 2   polyethylene glycol (MIRALAX / GLYCOLAX) packet Take 17 g by mouth daily.     sucralfate (CARAFATE) 1 GM/10ML suspension TAKE 10 MLS (1 G TOTAL) BY MOUTH 3 (THREE) TIMES DAILY - WITH MEALS AND AT BEDTIME. 420 mL 11   No current facility-administered medications for this visit.    Allergies as of 03/20/2020   (No Known Allergies)    Family History  Problem Relation Age of Onset   Breast cancer Daughter 55   Colon cancer Neg Hx     Social History   Socioeconomic History   Marital status: Widowed    Spouse name: Not on file   Number of children: 4   Years of education: Not on file   Highest education level: Not on file  Occupational History   Occupation: retired  Tobacco Use   Smoking status: Never Smoker   Smokeless tobacco: Never Used  Substance and Sexual Activity   Alcohol use: No   Drug use: No   Sexual activity: Not on file  Other Topics Concern   Not on file  Social History Narrative   01/30/20   From:  [place], [place] - but moved from [place]: Living with Daughter    Work: Retired - Armed forces logistics/support/administrative officer      Family: lives with daughter - Penelope Coop, and has 2 other living daughters      Enjoys: not sure      Exercise: tries to walk as much as she can   Diet: good Firefighter belts: Yes    Guns: Yes  and secure   Safe in relationships: Yes    Social Determinants of Radio broadcast assistant Strain:    Difficulty of Paying Living Expenses: Not on file  Food Insecurity:    Worried About Charity fundraiser in the Last Year: Not on file   [place] in the Last Year: Not on file  Transportation Needs:    Lack of Transportation (Medical): Not on file   Lack of Transportation (Non-Medical): Not on file  Physical  Activity:    Days of Exercise per Week: Not on file   Minutes of Exercise per Session: Not on file  Stress:    Feeling of Stress : Not on file  Social Connections:    Frequency of Communication with Friends and Family: Not on file   Frequency of Social Gatherings with Friends and Family: Not on file   Attends Religious Services: Not on file   Active Member of Clubs or Organizations: Not on file   Attends Archivist Meetings: Not on file   Marital Status: Not on file  Intimate Partner Violence:    Fear of Current or Ex-Partner: Not on file   Emotionally Abused: Not on file   Physically Abused: Not on file   Sexually Abused: Not on file     Physical Exam: BP 112/60    Pulse 91    Ht 5\' 1"  (1.549 m)    Wt 115 lb (52.2 kg)    SpO2 97%    BMI 21.73 kg/m  Constitutional: generally well-appearing Psychiatric: alert and oriented x3 Abdomen: soft, nontender, nondistended, no obvious ascites, no peritoneal signs, normal bowel sounds No peripheral edema noted in lower extremities  Assessment and plan: 86 y.o. female with chronic abdominal pains, GERD  I recommended she completely stop the Carafate and we will  remove it from her medicine list.  Instead she is going to take H2 blocker Pepcid, 20 mg pills 1 pill at bedtime every night in addition to her twice daily proton pump inhibitor.  She will follow up on an as-needed basis.  She has no alarm symptoms.  Please see the "Patient Instructions" section for addition details about the plan.  Owens Loffler, MD [facility] 03/20/2020, 11:01 AM   Total time on date of encounter was 20 minutes (this included time spent preparing to see the patient reviewing records; obtaining and/or reviewing separately obtained history; performing a medically appropriate exam and/or evaluation; counseling and educating the patient and family if present; ordering medications, tests or procedures if applicable; and documenting clinical information in the health record).

## 2020-03-20 NOTE — Patient Instructions (Addendum)
If you are age 84 or older, your body mass index should be between 23-30. Your Body mass index is 21.73 kg/m. If this is out of the aforementioned range listed, please consider follow up with your Primary Care Provider.  If you are age 47 or younger, your body mass index should be between 19-25. Your Body mass index is 21.73 kg/m. If this is out of the aformentioned range listed, please consider follow up with your Primary Care Provider.   STOP: Carafate  CONTINUE: protonix 40mg  one tablet twice daily.  START: Pepcid (famotidine) 20mg  take one at night.  Thank you for entrusting me with your care and choosing [facility].  Dr Ardis Hughs

## 2020-03-20 NOTE — Addendum Note (Signed)
Addended by: Stevan Born on: 03/20/2020 11:20 AM   Modules accepted: Orders

## 2020-03-22 ENCOUNTER — Telehealth: Payer: Self-pay

## 2020-03-22 DIAGNOSIS — I10 Essential (primary) hypertension: Secondary | ICD-10-CM

## 2020-03-22 MED ORDER — OLMESARTAN MEDOXOMIL 40 MG PO TABS: 40.0000 mg | ORAL_TABLET | Freq: Every day | ORAL | 3 refills | Status: DC

## 2020-03-22 NOTE — Telephone Encounter (Signed)
Pt needs a rx for olmesartan that reads to take it once a day as that is how she takes it now. Please send to [facility].

## 2020-03-23 ENCOUNTER — Other Ambulatory Visit: Payer: Self-pay

## 2020-03-23 ENCOUNTER — Telehealth: Payer: Self-pay

## 2020-03-23 ENCOUNTER — Emergency Department (HOSPITAL_COMMUNITY)
Admission: EM | Admit: 2020-03-23 | Discharge: 2020-03-23 | Disposition: A | Discharge status: Home / Self Care | Payer: Medicare Other | Attending: Emergency Medicine | Admitting: Emergency Medicine

## 2020-03-23 ENCOUNTER — Encounter (HOSPITAL_COMMUNITY): Payer: Self-pay

## 2020-03-23 ENCOUNTER — Emergency Department (HOSPITAL_COMMUNITY): Payer: Medicare Other

## 2020-03-23 DIAGNOSIS — R4182 Altered mental status, unspecified: Secondary | ICD-10-CM | POA: Insufficient documentation

## 2020-03-23 DIAGNOSIS — Z7982 Long term (current) use of aspirin: Secondary | ICD-10-CM | POA: Insufficient documentation

## 2020-03-23 DIAGNOSIS — R1084 Generalized abdominal pain: Secondary | ICD-10-CM | POA: Diagnosis not present

## 2020-03-23 DIAGNOSIS — R4689 Other symptoms and signs involving appearance and behavior: Secondary | ICD-10-CM

## 2020-03-23 DIAGNOSIS — I10 Essential (primary) hypertension: Secondary | ICD-10-CM | POA: Diagnosis not present

## 2020-03-23 DIAGNOSIS — Z79899 Other long term (current) drug therapy: Secondary | ICD-10-CM | POA: Insufficient documentation

## 2020-03-23 DIAGNOSIS — K2100 Gastro-esophageal reflux disease with esophagitis, without bleeding: Secondary | ICD-10-CM

## 2020-03-23 LAB — CBC WITH DIFFERENTIAL/PLATELET
Abs Immature Granulocytes: 0 10*3/uL (ref 0.00–0.07)
Basophils Absolute: 0 10*3/uL (ref 0.0–0.1)
Basophils Relative: 0 %
Eosinophils Absolute: 0 10*3/uL (ref 0.0–0.5)
Eosinophils Relative: 1 %
HCT: 41.7 % (ref 36.0–46.0)
Hemoglobin: 13.6 g/dL (ref 12.0–15.0)
Immature Granulocytes: 0 %
Lymphocytes Relative: 23 %
Lymphs Abs: 0.9 10*3/uL (ref 0.7–4.0)
MCH: 29.4 pg (ref 26.0–34.0)
MCHC: 32.6 g/dL (ref 30.0–36.0)
MCV: 90.1 fL (ref 80.0–100.0)
Monocytes Absolute: 0.3 10*3/uL (ref 0.1–1.0)
Monocytes Relative: 8 %
Neutro Abs: 2.7 10*3/uL (ref 1.7–7.7)
Neutrophils Relative %: 68 %
Platelets: 205 10*3/uL (ref 150–400)
RBC: 4.63 MIL/uL (ref 3.87–5.11)
RDW: 13.1 % (ref 11.5–15.5)
WBC: 3.9 10*3/uL — ABNORMAL LOW (ref 4.0–10.5)
nRBC: 0 % (ref 0.0–0.2)

## 2020-03-23 LAB — URINALYSIS, ROUTINE W REFLEX MICROSCOPIC
Bilirubin Urine: NEGATIVE
Glucose, UA: NEGATIVE mg/dL
Hgb urine dipstick: NEGATIVE
Ketones, ur: NEGATIVE mg/dL
Leukocytes,Ua: NEGATIVE
Nitrite: NEGATIVE
Protein, ur: NEGATIVE mg/dL
Specific Gravity, Urine: 1.005 (ref 1.005–1.030)
pH: 7 (ref 5.0–8.0)

## 2020-03-23 LAB — COMPREHENSIVE METABOLIC PANEL
ALT: 17 U/L (ref 0–44)
AST: 25 U/L (ref 15–41)
Albumin: 4.4 g/dL (ref 3.5–5.0)
Alkaline Phosphatase: 71 U/L (ref 38–126)
Anion gap: 9 (ref 5–15)
BUN: 15 mg/dL (ref 8–23)
CO2: 27 mmol/L (ref 22–32)
Calcium: 9.4 mg/dL (ref 8.9–10.3)
Chloride: 100 mmol/L (ref 98–111)
Creatinine, Ser: 0.87 mg/dL (ref 0.44–1.00)
GFR, Estimated: >60 mL/min (ref >60)
Glucose, Bld: 100 mg/dL — ABNORMAL HIGH (ref 70–99)
Potassium: 3.6 mmol/L (ref 3.5–5.1)
Sodium: 136 mmol/L (ref 135–145)
Total Bilirubin: 0.8 mg/dL (ref 0.3–1.2)
Total Protein: 7 g/dL (ref 6.5–8.1)

## 2020-03-23 MED ORDER — SUCRALFATE 1 G PO TABS: 1.0000 g | ORAL_TABLET | Freq: Three times a day (TID) | ORAL | 0 refills | Status: DC

## 2020-03-23 NOTE — Progress Notes (Signed)
..   Transition of Care [facility]) - Emergency Department Mini Assessment   Patient Details  Name: Ann Riddle MRN: [id number] Date of Birth: [birth year]  Transition of Care [facility]) CM/SW Contact:    Erenest Rasher, RN Phone Number: 03/23/2020, 6:03 PM   Clinical Narrative: TOC CM spoke to pt and pt's dtr, [place] at bedside. States they are considering placement at [facility]. Explained to pt's dtr process for Memory Care that she will need PCP to complete FL2 and begin search for a facility that will accept Medicaid. She will need to contact Medicaid CSW to have her Medicaid switched to Long term care. She was agreeable to [facility] to assist with Memory Care. Contacted [facility] [place] with new referral.    ED Mini Assessment: What brought you to the Emergency Department? : confusion, pain  Barriers to Discharge: No Barriers Identified     Means of departure: Car  Interventions which prevented an admission or readmission: SNF Placement    Patient Contact and Communications Key Contact 1: [place] with: daughter Contact Date: 03/23/20,   Contact time: [place] Phone Number: [phone]    Patient states their goals for this hospitalization and ongoing recovery are:: they are going to seek Memory Care   Choice offered to / list presented to : Adult Children  Admission diagnosis:  altered mental state Patient Active Problem List   Diagnosis Date Noted  . Atherosclerosis of aorta ([place]) 02/29/2020  . Vaginal lesion 02/29/2020  . Osteopenia 02/22/2020  . Insomnia 01/30/2020  . Current moderate episode of major depressive disorder without prior episode ([place]) 01/30/2020  . Generalized anxiety disorder 01/30/2020  . Porokeratosis 10/26/2019  . Abnormal nuclear cardiac imaging test   . Chest pain 11/13/2015  . Constipation 11/10/2007  . Hyperlipidemia 08/04/2007  . Essential hypertension 08/04/2007  . GERD 08/04/2007  . IBS 08/04/2007    PCP:  Lesleigh Noe, MD Pharmacy:   [id number] [place], [address] [place] [address] Phone: [phone] Fax: [phone]

## 2020-03-23 NOTE — ED Provider Notes (Signed)
[name] DEPT Provider Note   CSN: [id number] Arrival date & time: 03/23/20  1407     History No chief complaint on file.   Ann Riddle is a 86 y.o. female.  HPI     86 year old female comes in a chief complaint of altered mental status. Patient has history of IBS, GERD, diverticulosis.  Patient has not been diagnosed with dementia, but the daughter reports that patient has had some cognitive issues over the last few months.  About 5 months ago patient started getting worse and moved in with one of her daughters.  4 months ago she moved in with the daughter who is at the bedside.  She has noted that patient's mental status has declined over time.  Patient has been paranoid, blaming family for stealing stuff and poisoning her.  Yesterday night suddenly she got up and ran into the street and started screaming for help.  Patient has no complaints from her side at this time.  She reports having a saline GERD-like explained that an abdominal pain, none of which are new but apparently worse.  Patient has not been diagnosed with dementia.    Past Medical History:  Diagnosis Date  . Diaphragmatic hernia without mention of obstruction or gangrene   . Diverticulosis of colon (without mention of hemorrhage)   . Esophageal reflux   . Irritable bowel syndrome   . Other and unspecified hyperlipidemia   . Unspecified essential hypertension   . Ventral hernia, unspecified, without mention of obstruction or gangrene     Patient Active Problem List   Diagnosis Date Noted  . Atherosclerosis of aorta ([place]) 02/29/2020  . Vaginal lesion 02/29/2020  . Osteopenia 02/22/2020  . Insomnia 01/30/2020  . Current moderate episode of major depressive disorder without prior episode ([place]) 01/30/2020  . Generalized anxiety disorder 01/30/2020  . Porokeratosis 10/26/2019  . Abnormal nuclear cardiac imaging test   . Chest pain 11/13/2015  . Constipation 11/10/2007  .  Hyperlipidemia 08/04/2007  . Essential hypertension 08/04/2007  . GERD 08/04/2007  . IBS 08/04/2007    Past Surgical History:  Procedure Laterality Date  . CHOLECYSTECTOMY    . CORONARY ANGIOGRAPHY N/A 06/01/2018   Procedure: CORONARY ANGIOGRAPHY;  Surgeon: Sherren Mocha, MD;  Location: [facility];  Service: Cardiovascular;  Laterality: N/A;  . HIATAL HERNIA REPAIR       OB History   No obstetric history on file.     Family History  Problem Relation Age of Onset  . Breast cancer Daughter 65  . Colon cancer Neg Hx     Social History   Tobacco Use  . Smoking status: Never Smoker  . Smokeless tobacco: Never Used  Vaping Use  . Vaping Use: Never used  Substance Use Topics  . Alcohol use: No  . Drug use: No    Home Medications Prior to Admission medications   Medication Sig Start Date End Date Taking? Authorizing Provider  Acetaminophen (TYLENOL PO) Tylenol    [provider]  aspirin EC 81 MG tablet Take 81 mg by mouth daily.    [provider]  clonazePAM (KLONOPIN) 0.5 MG tablet Take 1 tablet (0.5 mg total) by mouth at bedtime as needed for anxiety (sleep). 02/20/20   Lesleigh Noe, MD  colesevelam (WELCHOL) 625 MG tablet Take 1 tablet (625 mg total) by mouth daily. 02/16/20   Milus Banister, MD  ENSURE (ENSURE) Take 237 mLs by mouth daily.    [provider]  escitalopram (LEXAPRO) 5 MG tablet Take 1 tablet (5 mg total) by mouth daily. 01/30/20   Lesleigh Noe, MD  famotidine (PEPCID) 20 MG tablet Take 1 tablet (20 mg total) by mouth at bedtime. 03/20/20   Milus Banister, MD  hyoscyamine (LEVSIN SL) 0.125 MG SL tablet Place 1 tablet (0.125 mg total) under the tongue every 8 (eight) hours as needed. 06/01/19   Milus Banister, MD  LINZESS 290 MCG CAPS capsule Take 290 mcg by mouth daily. 10/26/19   [provider]  Multiple Vitamin (MULTIVITAMIN WITH MINERALS) TABS tablet Take 1 tablet by mouth daily.    [provider]  nitroGLYCERIN (NITROSTAT) 0.4 MG SL tablet Place 1 tablet (0.4 mg total) under the tongue every 5 (five) minutes as needed for chest pain. 11/14/15   Thurnell Lose, MD  olmesartan (BENICAR) 40 MG tablet Take 1 tablet (40 mg total) by mouth daily. 03/22/20   Lesleigh Noe, MD  pantoprazole (PROTONIX) 40 MG tablet Take 1 tablet (40 mg total) by mouth 2 (two) times daily before a meal. 08/04/19   Milus Banister, MD  polyethylene glycol [facility] Flor[facility]) packet Take 17 g by mouth daily.    [provider]    Allergies    Patient has no known allergies.  Review of Systems   Review of Systems  Unable to perform ROS: Mental status change  Constitutional: Negative for activity change.  Gastrointestinal: Positive for abdominal pain.  Psychiatric/Behavioral: Positive for agitation and behavioral problems.    Physical Exam Updated Vital Signs BP (!) 161/136 (BP Location: Left Arm)   Pulse 80   Temp 98.4 F (36.9 C) (Oral)   Resp 16   Ht 5\' 1"  (1.549 m)   Wt 52.2 kg   SpO2 99%   BMI 21.73 kg/m   Physical Exam Vitals and nursing note reviewed.  Constitutional:      Appearance: She is well-developed.  HENT:     Head: Normocephalic and atraumatic.  Eyes:     Pupils: Pupils are equal, round, and reactive to light.  Cardiovascular:     Rate and Rhythm: Normal rate and regular rhythm.     Heart sounds: Normal heart sounds.  Pulmonary:     Effort: Pulmonary effort is normal. No respiratory distress.  Abdominal:     General: There is no distension.     Palpations: Abdomen is soft.     Tenderness: There is no guarding or rebound.     Comments: Generalized abd tenderness  Musculoskeletal:     Cervical back: Neck supple.  Skin:    General: Skin is warm and dry.  Neurological:     Mental Status: She is alert. Mental status is at baseline.     ED Results / Procedures / Treatments   Labs (all labs ordered are listed, but only abnormal results are  displayed) Labs Reviewed  CBC WITH DIFFERENTIAL/PLATELET - Abnormal; Notable for the following components:      Result Value   WBC 3.9 (*)    All other components within normal limits  COMPREHENSIVE METABOLIC PANEL - Abnormal; Notable for the following components:   Glucose, Bld 100 (*)    All other components within normal limits  URINALYSIS, ROUTINE W REFLEX MICROSCOPIC  I-STAT BETA HCG BLOOD, ED ([facility], [facility], [facility] ONLY)    EKG None  Radiology No results found.  Procedures Procedures (including critical care time)  Medications Ordered in ED Medications -  No data to display  ED Course  I have reviewed the triage vital signs and the nursing notes.  Pertinent labs & imaging results that were available during my care of the patient were reviewed by me and considered in my medical decision making (see chart for details).    MDM Rules/Calculators/A&P                          86 year old female comes in a chief complaint of her mental status. Patient does not carry a diagnosis of dementia. It appears that patient has had some cognitive decline over the last year. Her symptoms have progressed over the past few months. She sounds to have some episodes of paranoia. Yesterday she ran out on the street, screaming that her family was trying to harm her. Currently she is alert. She does not recall those events. History is not suggestive of any acute changes to her medication or help. She complains of chronic abdominal pain. She also reports some GERD-like symptoms.  Doubt that this is a medication/polypharmacy related issue. It appears that probably there is some dementia with resultant behavioral changes.  We had called case management as it appears to me that family is having hard time managing her. They are comfortable taking her home right now, but want her to be assessed for her behavioral issues and also long-term the do think she might need to be at a memory unit if her symptoms  progress.  Case management has seen the patient and set up appropriate outpatient follow-up. I have advised the family to also have patient see PCP. Labs are reassuring. We will get CT head and CT abdomen before she is discharged to ensure there is no tumor/mass.  The patient appears reasonably screened and/or stabilized for discharge and I doubt any other medical condition or other [facility] requiring further screening, evaluation, or treatment in the ED at this time prior to discharge.   Results from the ER workup discussed with the patient/family face to face and all questions answered to the best of my ability. The patient is safe for discharge with strict return precautions.   Final Clinical Impression(s) / ED Diagnoses Final diagnoses:  None    Rx / DC Orders ED Discharge Orders    None       Varney Biles, MD 03/23/20 1842

## 2020-03-23 NOTE — Discharge Instructions (Addendum)
All the results in the ER are normal, labs and imaging. Take the medications prescribed for optimal symptom control from your GERD. Recommend that you follow-up with your primary care doctor and follow-up on the recommendations provided by the case management team.

## 2020-03-23 NOTE — ED Triage Notes (Signed)
Patient's daughter reports that the patient went out of the house with only her pajamas on at 2100 last night. Patient then sat in the middle of the street when her daughter tried to get her. Patient then began yelling that her daughter was going to kill her.

## 2020-03-23 NOTE — ED Notes (Signed)
Patient transported to CT 

## 2020-03-23 NOTE — ED Notes (Signed)
Bed alarm was placed and activated for pt's safety.

## 2020-03-23 NOTE — Telephone Encounter (Signed)
Agree with ER evaluation. Would also recommend appointment next week for ER follow-up

## 2020-03-23 NOTE — Telephone Encounter (Signed)
Ann Riddle (DPR signed) said last night Ann Riddle found pt sitting in road screaming and hollaring that someone is trying to kill her. Pt went to neighbors house screaming and pts daughter finally got pt to go home when it started to rain. Pt was saying that she was going to die and that her daughter was trying to kill her.pt last appt with Dr Einar Pheasant on 02/29/20. Pt did sleep some last night but pt is still confused and upset. Ann Riddle will take pt or call 911 to take pt to [facility] ED for eval and possible testing. Sending note to Dr Einar Pheasant who is out of office and to Gentry Fitz NP who is in office.

## 2020-03-23 NOTE — Telephone Encounter (Signed)
Rx sent in by Dr. Einar Pheasant

## 2020-03-24 ENCOUNTER — Other Ambulatory Visit: Payer: Self-pay | Admitting: Family Medicine

## 2020-03-24 DIAGNOSIS — F411 Generalized anxiety disorder: Secondary | ICD-10-CM

## 2020-03-26 ENCOUNTER — Emergency Department (HOSPITAL_COMMUNITY): Payer: Medicare Other

## 2020-03-26 ENCOUNTER — Encounter (HOSPITAL_COMMUNITY): Payer: Self-pay

## 2020-03-26 ENCOUNTER — Emergency Department (HOSPITAL_COMMUNITY)
Admission: EM | Admit: 2020-03-26 | Discharge: 2020-03-27 | Disposition: A | Discharge status: Home / Self Care | Payer: Medicare Other | Attending: Emergency Medicine | Admitting: Emergency Medicine

## 2020-03-26 ENCOUNTER — Other Ambulatory Visit: Payer: Self-pay

## 2020-03-26 ENCOUNTER — Telehealth: Payer: Self-pay

## 2020-03-26 DIAGNOSIS — R443 Hallucinations, unspecified: Secondary | ICD-10-CM | POA: Diagnosis not present

## 2020-03-26 DIAGNOSIS — R441 Visual hallucinations: Secondary | ICD-10-CM | POA: Diagnosis not present

## 2020-03-26 DIAGNOSIS — I10 Essential (primary) hypertension: Secondary | ICD-10-CM | POA: Insufficient documentation

## 2020-03-26 DIAGNOSIS — Z7982 Long term (current) use of aspirin: Secondary | ICD-10-CM | POA: Insufficient documentation

## 2020-03-26 DIAGNOSIS — F29 Unspecified psychosis not due to a substance or known physiological condition: Secondary | ICD-10-CM | POA: Diagnosis present

## 2020-03-26 DIAGNOSIS — F22 Delusional disorders: Secondary | ICD-10-CM | POA: Diagnosis not present

## 2020-03-26 DIAGNOSIS — Z751 Person awaiting admission to adequate facility elsewhere: Secondary | ICD-10-CM

## 2020-03-26 DIAGNOSIS — F321 Major depressive disorder, single episode, moderate: Secondary | ICD-10-CM | POA: Insufficient documentation

## 2020-03-26 DIAGNOSIS — R442 Other hallucinations: Secondary | ICD-10-CM | POA: Diagnosis present

## 2020-03-26 LAB — URINALYSIS, ROUTINE W REFLEX MICROSCOPIC
Bilirubin Urine: NEGATIVE
Glucose, UA: NEGATIVE mg/dL
Hgb urine dipstick: NEGATIVE
Ketones, ur: NEGATIVE mg/dL
Nitrite: NEGATIVE
Protein, ur: NEGATIVE mg/dL
Specific Gravity, Urine: 1.013 (ref 1.005–1.030)
pH: 7 (ref 5.0–8.0)

## 2020-03-26 LAB — CBC WITH DIFFERENTIAL/PLATELET
Abs Immature Granulocytes: 0.01 10*3/uL (ref 0.00–0.07)
Basophils Absolute: 0 10*3/uL (ref 0.0–0.1)
Basophils Relative: 0 %
Eosinophils Absolute: 0 10*3/uL (ref 0.0–0.5)
Eosinophils Relative: 0 %
HCT: 38.7 % (ref 36.0–46.0)
Hemoglobin: 12.7 g/dL (ref 12.0–15.0)
Immature Granulocytes: 0 %
Lymphocytes Relative: 17 %
Lymphs Abs: 0.6 10*3/uL — ABNORMAL LOW (ref 0.7–4.0)
MCH: 29.6 pg (ref 26.0–34.0)
MCHC: 32.8 g/dL (ref 30.0–36.0)
MCV: 90.2 fL (ref 80.0–100.0)
Monocytes Absolute: 0.3 10*3/uL (ref 0.1–1.0)
Monocytes Relative: 8 %
Neutro Abs: 2.5 10*3/uL (ref 1.7–7.7)
Neutrophils Relative %: 75 %
Platelets: 201 10*3/uL (ref 150–400)
RBC: 4.29 MIL/uL (ref 3.87–5.11)
RDW: 13.1 % (ref 11.5–15.5)
WBC: 3.4 10*3/uL — ABNORMAL LOW (ref 4.0–10.5)
nRBC: 0 % (ref 0.0–0.2)

## 2020-03-26 LAB — BASIC METABOLIC PANEL
Anion gap: 9 (ref 5–15)
BUN: 18 mg/dL (ref 8–23)
CO2: 28 mmol/L (ref 22–32)
Calcium: 9.1 mg/dL (ref 8.9–10.3)
Chloride: 101 mmol/L (ref 98–111)
Creatinine, Ser: 0.98 mg/dL (ref 0.44–1.00)
GFR, Estimated: 53 mL/min — ABNORMAL LOW (ref >60)
Glucose, Bld: 106 mg/dL — ABNORMAL HIGH (ref 70–99)
Potassium: 3.9 mmol/L (ref 3.5–5.1)
Sodium: 138 mmol/L (ref 135–145)

## 2020-03-26 LAB — TSH: TSH: 1.27 u[IU]/mL (ref 0.350–4.500)

## 2020-03-26 LAB — AMMONIA: Ammonia: 22 umol/L (ref 9–35)

## 2020-03-26 LAB — FOLATE: Folate: 39.7 ng/mL (ref >5.9)

## 2020-03-26 LAB — I-STAT BETA HCG BLOOD, ED (MC, WL, AP ONLY): I-stat hCG, quantitative: <5 m[IU]/mL (ref <5)

## 2020-03-26 LAB — VITAMIN B12: Vitamin B-12: 599 pg/mL (ref 180–914)

## 2020-03-26 MED ORDER — BENZTROPINE MESYLATE 0.5 MG PO TABS: 0.5000 mg | ORAL_TABLET | Freq: Every day | ORAL | Status: DC

## 2020-03-26 MED ORDER — ESCITALOPRAM OXALATE 10 MG PO TABS
5.0000 mg | ORAL_TABLET | Freq: Every day | ORAL | Status: DC
  Administered 2020-03-26 – 2020-03-27 (×2): 5 mg via ORAL
  Filled 2020-03-26 (×2): qty 1

## 2020-03-26 MED ORDER — CLONAZEPAM 0.5 MG PO TABS
0.5000 mg | ORAL_TABLET | Freq: Every evening | ORAL | Status: DC | PRN
  Filled 2020-03-26: qty 1

## 2020-03-26 MED ORDER — RISPERIDONE 1 MG PO TBDP: 0.5000 mg | ORAL_TABLET | Freq: Two times a day (BID) | ORAL | Status: DC

## 2020-03-26 MED ORDER — OLANZAPINE 5 MG PO TBDP
5.0000 mg | ORAL_TABLET | Freq: Every day | ORAL | Status: DC
  Administered 2020-03-26: 5 mg via ORAL
  Filled 2020-03-26: qty 1

## 2020-03-26 NOTE — Telephone Encounter (Signed)
Last office visit 02/29/2020 for insomnia, GAD, HTN, atherosclerosis or aorta, GERD and vaginal lesion.  Currently in ED for hallucinations and paranoia.  Last refilled 02/20/2020 for #30 with no refills.  Next Appt: 04/10/2020 for follow up.

## 2020-03-26 NOTE — ED Provider Notes (Addendum)
Shift handoff received from [facility].  Please see her note for full HPI.  In short 86 year old female presents with complaints of hallucinations.  Seen here several days ago in the ER and had extensive work-up which did not show any medical explanations for her hallucinations.  Presents today with similar complaints.  Patient was wanting psychiatry evaluation.  Plan was to have her evaluated by TTS and follow-up on UA.  Per chart review for TTS, patient was recommended for observation overnight and reevaluation by psych in the morning.  UA without clear evidence of UTI. Spoke w/ daughter and updated on TTS recommendation. She is agreeable. Pt remains calm, cooperative, stable here in the ED at this time.        Garald Balding, PA-C 03/26/20 1755    Maudie Flakes, MD 03/26/20 [phone]

## 2020-03-26 NOTE — ED Provider Notes (Addendum)
Medical screening examination/treatment/procedure(s) were conducted as a shared visit with non-physician practitioner(s) and myself.  I personally evaluated the patient during the encounter. Briefly, the patient is a 86 y.o. female for intermittent hallucinations.  Had fairly extensive work-up several days ago in the ED that were unremarkable.  Family requesting psychiatric evaluation.  Patient overall appears well.  Can tell me her name and where she is.  Cannot tell me exactly what her hallucinations are.  Will check basic labs and urinalysis.  No need for more in-depth work-up as likely some dementia causing the symptoms.  We will have her evaluated by psychiatry.  This chart was dictated using voice recognition software.  Despite best efforts to proofread,  errors can occur which can change the documentation meaning.     EKG Interpretation  Date/Time:  Monday March 26 2020 13:39:12 EST Ventricular Rate:  85 PR Interval:  138 QRS Duration: 66 QT Interval:  368 QTC Calculation: 437 R Axis:   21 Text Interpretation: Normal sinus rhythm Normal ECG Confirmed by Lennice Sites [phone]) on 03/26/2020 3:16:26 PM           Lennice Sites, DO 03/26/20 1332    [place], [place], DO 03/26/20 1516

## 2020-03-26 NOTE — Consult Note (Addendum)
Tele Assessment  Ann Riddle, 86 y.o., female patient presented to [facility] ED with complaints patient having hallucinations and paranoia thinking that people are watching her and trying to kill her.  Patient seen via telepsych by TTS and this provider; chart reviewed and consulted with Dr. Dwyane Dee on 03/26/20.  On evaluation Ann Riddle reports she came to the hospital because she was not feeling well.  Patient states that she is living with her daughter.  Patient denies suicidal/self-harm/homicidal ideation, psychosis, and paranoia.  When asked if she was paranoid that someone was watching her or trying to harm or kill her patient replied "I don't want nobody to kill me."  Patient states that sometimes in her dreams she feel as if someone is watching her but it doesn't happen daily.   During evaluation Ann Riddle is elevated up in bed.  She is alert x 3; She is calm/cooperative through out assessment; and mood congruent with affect.  Patient is speaking in a clear tone at moderate volume, and normal pace; with good eye contact.  Her thought process is coherent and relevant; There is no indication that she is currently responding to internal/external stimuli or experiencing delusional thought content.  Patient denies suicidal/self-harm/homicidal ideation, psychosis, and paranoia.  Patient gave permission to speak to her daughter for collateral information.  See TTS Assessment note for details of conversation.  Daughter did have concerns that patient has been paranoid and has ran out of the house in the middle of the night stating that someone was going to kill her and sat in the middle of the road.    Labs Reviewed/Orders Placed:  Lab Orders     Basic metabolic panel     CBC with Differential     Urinalysis, Routine w reflex microscopic     TSH     Vitamin B12     Ammonia     Folate, serum, performed at [facility] lab  Patient was recently treated for UTI in October 2021 a repeat urinalysis  11/19 was WNL.  No results back on Urinalysis for 03/26/2020  Results for orders placed or performed during the hospital encounter of 03/26/20 (from the past 24 hour(s))  Basic metabolic panel     Status: Abnormal   Collection Time: 03/26/20  1:16 PM  Result Value Ref Range   Sodium 138 135 - 145 mmol/L   Potassium 3.9 3.5 - 5.1 mmol/L   Chloride 101 98 - 111 mmol/L   CO2 28 22 - 32 mmol/L   Glucose, Bld 106 (H) 70 - 99 mg/dL   BUN 18 8 - 23 mg/dL   Creatinine, Ser 0.98 0.44 - 1.00 mg/dL   Calcium 9.1 8.9 - 10.3 mg/dL   GFR, Estimated 53 (L) >60 mL/min   Anion gap 9 5 - 15  CBC with Differential     Status: Abnormal   Collection Time: 03/26/20  1:16 PM  Result Value Ref Range   WBC 3.4 (L) 4.0 - 10.5 K/uL   RBC 4.29 3.87 - 5.11 MIL/uL   Hemoglobin 12.7 12.0 - 15.0 g/dL   HCT 38.7 36 - 46 %   MCV 90.2 80.0 - 100.0 fL   MCH 29.6 26.0 - 34.0 pg   MCHC 32.8 30.0 - 36.0 g/dL   RDW 13.1 11.5 - 15.5 %   Platelets 201 150 - 400 K/uL   nRBC 0.0 0.0 - 0.2 %   Neutrophils Relative % 75 %   Neutro Abs 2.5  1.7 - 7.7 K/uL   Lymphocytes Relative 17 %   Lymphs Abs 0.6 (L) 0.7 - 4.0 K/uL   Monocytes Relative 8 %   Monocytes Absolute 0.3 0.1 - 1.0 K/uL   Eosinophils Relative 0 %   Eosinophils Absolute 0.0 0.0 - 0.5 K/uL   Basophils Relative 0 %   Basophils Absolute 0.0 0.0 - 0.1 K/uL   Immature Granulocytes 0 %   Abs Immature Granulocytes 0.01 0.00 - 0.07 K/uL       EKG: 03/26/2020: Vent. rate  85 BPM     Normal sinus rhythm PR interval  138 ms      Normal ECG QRS duration  66 ms QT/QTc  368/437 ms P-R-T axes  64 21 63  Head CT scan 03/23/2020: IMPRESSION: 1. No acute intracranial findings. 2. Chronic microvascular angiopathy and parenchymal volume loss  Medication Management:   Started ZYPREXA disintegrating tablet 5 mg Q hs Restarted home psychotropic medications KLONOPIN tablet 0.5 mg Q hs LEXAPRO tablet 5 mg daily    Recommendations:  Will start medication for  paranoid, delusional thinking and monitor the patient overnight.  Will order TSH, Folate, B 12, Ammonia, and chest x-ray.  EDP to restart home medications  Disposition:  Pending reassessment in morning.  At this time patient may need follow up with neurology for worsening of dementia.       Bill Yohn B. Riven Mabile, NP

## 2020-03-26 NOTE — BHH Counselor (Signed)
Ann Maxon, RN, notified through secure chat that per Earleen Newport, NP, patient is recommended for overnight observation in ED. Psych will reassess pt tomorrow.

## 2020-03-26 NOTE — BH Assessment (Signed)
Comprehensive Clinical Assessment (CCA) Note  03/26/2020 Ann Riddle [id number]  Ann Riddle is a 86 year old female presenting to [facility] voluntarily with concerns of hallucinations. Patient is hard of hearing, so it was difficult obtaining some of the information needed for a full assessment. Patient reports that she was not feeling well today because of acid reflux. Patient states that she has not eaten today, and she hurt her finger while in the ED. Patient denies SI/HI/AVH however, she reports "bad dreams of someone after me". Patient does not identify what her bad dream is about or who is after her but reports the dreams started a short while ago. Patient reports that people are watching her and listening to her conversations on the phone. NP is present during assessment. Patient provides consent for clinician to contact her daughter Ann Riddle for collateral information.   Daughter reports on Thursday patient woke up at 9:00pm and ran out the house while it was raining saying someone was going to kill her. Daughter reports that patient sat in the middle of the road screaming "help someone trying to kill me". Daughter reports that patient started knocking on a neighbor's door requesting that they let her in because someone was trying to kill her. Daughter reports calling her son to help get patient in the house. Daughter reports taking patient to see her PCP where they ran some test, consequently, patient PCP did not find anything medically wrong with her. Daughter reports that patient is not taking her medication or eating because she is paranoid that someone is trying to poison her. Daughter reports taking patient phone because she is calling 911 reporting that her stomach is hurting and that someone is trying to hurt her. Daughter states that patient is up early in the morning walking around the house. Daughter states that patient threw away some of her medications due to paranoia. Daughter reports  that patient is not receiving any in-home services and states that she is able to come back home. Daughter is wanting patient to be on some type of medications to help with paranoia "erratic behaviors and to settle her nerves".    Disposition: Per Shuvon Rankin, NP, patient is recommended for overnight observation at [facility].   Chief Complaint:  Chief Complaint  Patient presents with  . Hallucinations     CCA Screening, Triage and Referral (STR)  Patient Reported Information How did you hear about [place]? No data recorded Referral name: No data recorded Referral phone number: No data recorded  Whom do you see for routine medical problems? Primary Care  Practice/Facility Name: No data recorded Practice/Facility Phone Number: No data recorded Name of Contact: No data recorded Contact Number: No data recorded Contact Fax Number: No data recorded Prescriber Name: No data recorded Prescriber Address (if known): No data recorded  What Is the Reason for Your Visit/Call Today? No data recorded How Long Has This Been Causing You Problems? 1 wk - 1 month  What Do You Feel Would Help You the Most Today? Assessment Only;Medication   Have You Recently Been in Any Inpatient Treatment (Hospital/Detox/Crisis Center/28-Day Program)? No  Name/Location of Program/Hospital:No data recorded How Long Were You There? No data recorded When Were You Discharged? No data recorded  Have You Ever Received Services From [facility] Before? Yes  Who Do You See at [facility]? No data recorded  Have You Recently Had Any Thoughts About Hurting Yourself? No  Are You Planning to Commit Suicide/Harm Yourself At This time? No  Have you Recently Had Thoughts About [place]? No  Explanation: No data recorded  Have You Used Any Alcohol or Drugs in the Past 24 Hours? No  How Long Ago Did You Use Drugs or Alcohol? No data recorded What Did You Use and How Much? No data recorded  Do You  Currently Have a Therapist/Psychiatrist? No  Name of Therapist/Psychiatrist: No data recorded  Have You Been Recently Discharged From Any Office Practice or Programs? No  Explanation of Discharge From Practice/Program: No data recorded    CCA Screening Triage Referral Assessment Type of Contact: Tele-Assessment  Is this Initial or Reassessment? Initial Assessment  Date Telepsych consult ordered in [facility]:  03/26/20  Time Telepsych consult ordered in [facility]:  No data recorded  Patient Reported Information Reviewed? Yes  Patient Left Without Being Seen? No data recorded Reason for Not Completing Assessment: No data recorded  Collateral Involvement: Pt daughter Ann Riddle   Does Patient Have a Court Appointed Legal Guardian? No data recorded Name and Contact of Legal Guardian: No data recorded If Minor and Not Living with Parent(s), Who has Custody? No data recorded Is CPS involved or ever been involved? No data recorded Is APS involved or ever been involved? No data recorded  Patient Determined To Be At Risk for Harm To Self or Others Based on Review of Patient Reported Information or Presenting Complaint? No  Method: No data recorded Availability of Means: No data recorded Intent: No data recorded Notification Required: No data recorded Additional Information for Danger to Others Potential: No data recorded Additional Comments for Danger to Others Potential: No data recorded Are There Guns or Other Weapons in Your Home? No data recorded Types of Guns/Weapons: No data recorded Are These Weapons Safely Secured?                            No data recorded Who Could Verify You Are Able To Have These Secured: No data recorded Do You Have any Outstanding Charges, Pending Court Dates, Parole/Probation? No data recorded Contacted To Inform of Risk of Harm To Self or Others: No data recorded  Location of Assessment: [facility]   Does Patient Present under Involuntary  Commitment? No  IVC Papers Initial File Date: No data recorded  [place] of Residence: [place]   Patient Currently Receiving the Following Services: No data recorded  Determination of Need: No data recorded  Options For Referral: Medication Management;Outpatient Therapy     CCA Biopsychosocial Intake/Chief Complaint:  paranoia, hallucinations  Current Symptoms/Problems: acid reflux, bad dreams   Patient Reported Schizophrenia/Schizoaffective Diagnosis in Past: No   Strengths: UTA  Preferences: UTA  Abilities: UTA   Type of Services Patient Feels are Needed: No data recorded  Initial Clinical Notes/Concerns: No data recorded  Mental Health Symptoms Depression:  None   Duration of Depressive symptoms: No data recorded  Mania:  None   Anxiety:   Worrying;Sleep   Psychosis:  Hallucinations;Delusions   Duration of Psychotic symptoms: Less than six months   Trauma:  None   Obsessions:  None   Compulsions:  None   Inattention:  None   Hyperactivity/Impulsivity:  N/A   Oppositional/Defiant Behaviors:  None   Emotional Irregularity:  None   Other Mood/Personality Symptoms:  No data recorded   Mental Status Exam Appearance and self-care  Stature:  Average   Weight:  Average weight   Clothing:  Neat/clean   Grooming:  Normal  Cosmetic use:  None   Posture/gait:  Normal   Motor activity:  Not Remarkable   Sensorium  Attention:  Normal   Concentration:  Normal   Orientation:  X5   Recall/memory:  Defective in Short-term   Affect and Mood  Affect:  Full Range   Mood:  No data recorded  Relating  Eye contact:  Normal   Facial expression:  Responsive   Attitude toward examiner:  Cooperative   Thought and Language  Speech flow: Loud   Thought content:  Appropriate to Mood and Circumstances   Preoccupation:  None   Hallucinations:  No data recorded  Organization:  No data recorded  [employer] of Knowledge:   Fair   Intelligence:  Average   Abstraction:  Normal   Judgement:  Fair   Reality Testing:  Distorted   Insight:  Fair   Decision Making:  Normal   Social Functioning  Social Maturity:  Responsible   Social Judgement:  Normal   Stress  Stressors:  No data recorded  Coping Ability:  Normal   Skill Deficits:  Decision making;Responsibility;Self-care;Activities of daily living   Supports:  Family     Religion:    Leisure/Recreation:    Exercise/Diet:     CCA Employment/Education Employment/Work Situation: Employment / Work Situation Employment situation: Retired  Education:     CCA Family/Childhood History Family and Relationship History: Family history Marital status: Widowed Are you sexually active?: No Does patient have children?: Yes How many children?: 4  Childhood History:  Childhood History Additional childhood history information: UTA Description of patient's relationship with caregiver when they were a child: UTA Patient's description of current relationship with people who raised him/her: UTA How were you disciplined when you got in trouble as a child/adolescent?: UTA  Child/Adolescent Assessment:     CCA Substance Use Alcohol/Drug Use: Alcohol / Drug Use Pain Medications: See MAR Prescriptions: See MAR Over the Counter: See MAR History of alcohol / drug use?: No history of alcohol / drug abuse                         ASAM's:  Six Dimensions of Multidimensional Assessment  Dimension 1:  Acute Intoxication and/or Withdrawal Potential:      Dimension 2:  Biomedical Conditions and Complications:      Dimension 3:  Emotional, Behavioral, or Cognitive Conditions and Complications:     Dimension 4:  Readiness to Change:     Dimension 5:  Relapse, Continued use, or Continued Problem Potential:     Dimension 6:  Recovery/Living Environment:     ASAM Severity Score:    ASAM Recommended Level of Treatment:     Substance  use Disorder (SUD)    Recommendations for Services/Supports/Treatments: Recommendations for Services/Supports/Treatments Recommendations For Services/Supports/Treatments: Medication Management  DSM5 Diagnoses: Patient Active Problem List   Diagnosis Date Noted  . Psychosis ([place])   . Atherosclerosis of aorta ([place]) 02/29/2020  . Vaginal lesion 02/29/2020  . Osteopenia 02/22/2020  . Insomnia 01/30/2020  . Current moderate episode of major depressive disorder without prior episode ([place]) 01/30/2020  . Generalized anxiety disorder 01/30/2020  . Porokeratosis 10/26/2019  . Abnormal nuclear cardiac imaging test   . Chest pain 11/13/2015  . Constipation 11/10/2007  . Hyperlipidemia 08/04/2007  . Essential hypertension 08/04/2007  . GERD 08/04/2007  . IBS 08/04/2007    Disposition: Per Shuvon Rankin, NP, patient is recommended for overnight observation at [facility].   Nastacia Raybuck  Julien Nordmann, [facility]

## 2020-03-26 NOTE — Telephone Encounter (Signed)
[facility] - Client TELEPHONE ADVICE RECORD [facility] Patient Name: Ann Riddle Gender: Female DOB: [birth year] Age: 86 Y 50 M 20 D Return Phone Number: [phone] (Primary) Address: City/State/Zip: [place] [address] Client [facility] - Client Client Site [place] - Day Physician Waunita Schooner- MD Contact Type Call Who Is Calling Patient / Member / Family / Caregiver Call Type Triage / Clinical Caller Name Aquinittia Relationship To Patient Daughter Return Phone Number [phone] (Primary) Chief Complaint CONFUSION - new onset Reason for Call Symptomatic / Request for [place] states her Mother is very combative. Very confusion. Translation No Nurse Assessment Nurse: Jearld Pies, RN, Lovena Le Date/Time Eilene Ghazi Time): 03/26/2020 10:51:06 AM Confirm and document reason for call. If symptomatic, describe symptoms. ---Caller states her mother is very combative and very confusion off an on for a while and has gotten worse this week. Was at ED for 6 hours on Friday and everything physically was good. Did not do pych evaluation. Pt is pacing and having hallucinations. Denies difficulty breathing or any other symptoms at this time. Does the patient have any new or worsening symptoms? ---Yes Will a triage be completed? ---Yes Related visit to physician within the last 2 weeks? ---Yes Does the PT have any chronic conditions? (i.e. diabetes, asthma, this includes High risk factors for pregnancy, etc.) ---No Is this a behavioral health or substance abuse call? ---No Guidelines Guideline Title Affirmed Question Affirmed Notes Nurse Date/Time (Eastern Time) Confusion - Delirium [1] Difficult to awaken or acting confused (e.g., disoriented, slurred speech) AND [2] present now AND [3] new-onset Jake Bathe 03/26/2020 10:53:41 AM Disp. Time Eilene Ghazi Time)  Disposition Final User 03/26/2020 10:49:02 AM Send to Urgent Queue Baruch Goldmann 03/26/2020 11:02:56 AM 911 Outcome Documentation Jearld Pies, RN, Lovena Le Reason: EMS in route PLEASE NOTE: All timestamps contained within this report are represented as [place] Standard Time. CONFIDENTIALTY NOTICE: This fax transmission is intended only for the addressee. It contains information that is legally privileged, confidential or otherwise protected from use or disclosure. If you are not the intended recipient, you are strictly prohibited from reviewing, disclosing, copying using or disseminating any of this information or taking any action in reliance on or regarding this information. If you have received this fax in error, please notify us immediately by telephone so that we can arrange for its return to [place]. Phone: [phone], Toll-Free: [phone], Fax: [phone] Page: 2 of 2 Call Id: [id number] 03/26/2020 10:56:25 AM Call EMS 911 Now Yes Jearld Pies, RN, Apolonio Schneiders Disagree/Comply Comply Caller Understands Yes PreDisposition InappropriateToAsk Care Advice Given Per Guideline * Triager Discretion: I'll call you back in a few minutes to be sure you were able to reach them. * Immediate medical attention is needed. You need to hang up and call 911 (or an ambulance). CALL EMS 911 NOW:

## 2020-03-26 NOTE — ED Notes (Signed)
Patient transported to X-ray 

## 2020-03-26 NOTE — ED Triage Notes (Signed)
Pt arrived via EMS from home, per pt daughter hallucinating x1 week. Has been stating someone is trying to kill her at home, states she is being poisoned. No hx of dementia. Pt aox4 in triage.

## 2020-03-26 NOTE — Telephone Encounter (Signed)
I left v/m for Aquinittia to call [facility] with update of how pt is and where pt is. Sending note to Dr Einar Pheasant and Adonis Brook CMA.

## 2020-03-26 NOTE — ED Notes (Signed)
Pt brought to rm 33 for TTS assessment.

## 2020-03-26 NOTE — Telephone Encounter (Signed)
See other phone note. PT scheduled 11/23 at 4

## 2020-03-26 NOTE — Telephone Encounter (Signed)
Called patient and scheduled for 11/23 at 4:00 with PCP

## 2020-03-26 NOTE — ED Provider Notes (Signed)
[facility] DEPT Provider Note   CSN: [id number] Arrival date & time: 03/26/20  1229     History Chief Complaint  Patient presents with  . Hallucinations    Ann Riddle is a 86 y.o. female with a past medical history of depression, anxiety, GERD, hypertension presenting to the ED for hallucinations.  History is provided by daughter over the phone.  States that patient has had progressive worsening forgetfulness and intermittent hallucinations and paranoia over the past few months.  She noticed today that symptoms had gotten worse, she was claiming that someone was going to kill her and throwing away all of her medications.  She was seen and evaluated last week for a similar episode and medically cleared.  She has not followed up regarding her behavioral disturbances and has not seen a neurologist and never been diagnosed formally with dementia.  Patient herself admits that she does hallucinate but denies any other complaints.  Reports having chronic GERD.  States that her medications have changed recently for her GERD.  Denies any vomiting, constipation, abdominal pain or chest pain.  HPI     Past Medical History:  Diagnosis Date  . Diaphragmatic hernia without mention of obstruction or gangrene   . Diverticulosis of colon (without mention of hemorrhage)   . Esophageal reflux   . Irritable bowel syndrome   . Other and unspecified hyperlipidemia   . Unspecified essential hypertension   . Ventral hernia, unspecified, without mention of obstruction or gangrene     Patient Active Problem List   Diagnosis Date Noted  . Atherosclerosis of aorta ([place]) 02/29/2020  . Vaginal lesion 02/29/2020  . Osteopenia 02/22/2020  . Insomnia 01/30/2020  . Current moderate episode of major depressive disorder without prior episode ([place]) 01/30/2020  . Generalized anxiety disorder 01/30/2020  . Porokeratosis 10/26/2019  . Abnormal nuclear cardiac imaging test   .  Chest pain 11/13/2015  . Constipation 11/10/2007  . Hyperlipidemia 08/04/2007  . Essential hypertension 08/04/2007  . GERD 08/04/2007  . IBS 08/04/2007    Past Surgical History:  Procedure Laterality Date  . CHOLECYSTECTOMY    . CORONARY ANGIOGRAPHY N/A 06/01/2018   Procedure: CORONARY ANGIOGRAPHY;  Surgeon: Sherren Mocha, MD;  Location: [facility];  Service: Cardiovascular;  Laterality: N/A;  . HIATAL HERNIA REPAIR       OB History   No obstetric history on file.     Family History  Problem Relation Age of Onset  . Breast cancer Daughter 45  . Colon cancer Neg Hx     Social History   Tobacco Use  . Smoking status: Never Smoker  . Smokeless tobacco: Never Used  Vaping Use  . Vaping Use: Never used  Substance Use Topics  . Alcohol use: No  . Drug use: No    Home Medications Prior to Admission medications   Medication Sig Start Date End Date Taking? Authorizing Provider  Acetaminophen (TYLENOL PO) Tylenol    [provider]  aspirin EC 81 MG tablet Take 81 mg by mouth daily.    [provider]  clonazePAM (KLONOPIN) 0.5 MG tablet Take 1 tablet (0.5 mg total) by mouth at bedtime as needed for anxiety (sleep). 02/20/20   Lesleigh Noe, MD  colesevelam (WELCHOL) 625 MG tablet Take 1 tablet (625 mg total) by mouth daily. 02/16/20   Milus Banister, MD  ENSURE (ENSURE) Take 237 mLs by mouth daily.    [provider]  escitalopram (LEXAPRO) 5  MG tablet Take 1 tablet (5 mg total) by mouth daily. 01/30/20   Lesleigh Noe, MD  famotidine (PEPCID) 20 MG tablet Take 1 tablet (20 mg total) by mouth at bedtime. 03/20/20   Milus Banister, MD  hyoscyamine (LEVSIN SL) 0.125 MG SL tablet Place 1 tablet (0.125 mg total) under the tongue every 8 (eight) hours as needed. 06/01/19   Milus Banister, MD  LINZESS 290 MCG CAPS capsule Take 290 mcg by mouth daily. 10/26/19   [provider]  Multiple Vitamin (MULTIVITAMIN WITH MINERALS) TABS  tablet Take 1 tablet by mouth daily.    [provider]  nitroGLYCERIN (NITROSTAT) 0.4 MG SL tablet Place 1 tablet (0.4 mg total) under the tongue every 5 (five) minutes as needed for chest pain. 11/14/15   Thurnell Lose, MD  olmesartan (BENICAR) 40 MG tablet Take 1 tablet (40 mg total) by mouth daily. 03/22/20   Lesleigh Noe, MD  pantoprazole (PROTONIX) 40 MG tablet Take 1 tablet (40 mg total) by mouth 2 (two) times daily before a meal. 08/04/19   Milus Banister, MD  polyethylene glycol [facility] Floria Raveling) packet Take 17 g by mouth daily.    [provider]  sucralfate (CARAFATE) 1 g tablet Take 1 tablet (1 g total) by mouth 4 (four) times daily -  with meals and at bedtime. 03/23/20   Varney Biles, MD    Allergies    Patient has no known allergies.  Review of Systems   Review of Systems  Constitutional: Negative for appetite change, chills and fever.  HENT: Negative for ear pain, rhinorrhea, sneezing and sore throat.   Eyes: Negative for photophobia and visual disturbance.  Respiratory: Negative for cough, chest tightness, shortness of breath and wheezing.   Cardiovascular: Negative for chest pain and palpitations.  Gastrointestinal: Negative for abdominal pain, blood in stool, constipation, diarrhea, nausea and vomiting.  Genitourinary: Negative for dysuria, hematuria and urgency.  Musculoskeletal: Negative for myalgias.  Skin: Negative for rash.  Neurological: Negative for dizziness, weakness and light-headedness.  Psychiatric/Behavioral: Positive for hallucinations.    Physical Exam Updated Vital Signs BP (!) 145/75 (BP Location: Right Arm)   Pulse 84   Temp 98.1 F (36.7 C) (Oral)   Resp 18   SpO2 98%   Physical Exam Vitals and nursing note reviewed.  Constitutional:      General: She is not in acute distress.    Appearance: She is well-developed.  HENT:     Head: Normocephalic and atraumatic.     Nose: Nose normal.  Eyes:     General: No  scleral icterus.       Right eye: No discharge.        Left eye: No discharge.     Conjunctiva/sclera: Conjunctivae normal.  Cardiovascular:     Rate and Rhythm: Normal rate and regular rhythm.     Heart sounds: Normal heart sounds. No murmur heard.  No friction rub. No gallop.   Pulmonary:     Effort: Pulmonary effort is normal. No respiratory distress.     Breath sounds: Normal breath sounds.  Abdominal:     General: Bowel sounds are normal. There is no distension.     Palpations: Abdomen is soft.     Tenderness: There is no abdominal tenderness. There is no guarding.  Musculoskeletal:        General: Normal range of motion.     Cervical back: Normal range of motion and neck supple.  Skin:    General: Skin is warm and dry.     Findings: No rash.  Neurological:     General: No focal deficit present.     Mental Status: She is alert and oriented to person, place, and time.     Cranial Nerves: No cranial nerve deficit.     Motor: No weakness or abnormal muscle tone.     Coordination: Coordination normal.     ED Results / Procedures / Treatments   Labs (all labs ordered are listed, but only abnormal results are displayed) Labs Reviewed  BASIC METABOLIC PANEL - Abnormal; Notable for the following components:      Result Value   Glucose, Bld 106 (*)    GFR, Estimated 53 (*)    All other components within normal limits  CBC WITH DIFFERENTIAL/PLATELET - Abnormal; Notable for the following components:   WBC 3.4 (*)    Lymphs Abs 0.6 (*)    All other components within normal limits  URINALYSIS, ROUTINE W REFLEX MICROSCOPIC    EKG None  Radiology No results found.  Procedures Procedures (including critical care time)  Medications Ordered in ED Medications - No data to display  ED Course  I have reviewed the triage vital signs and the nursing notes.  Pertinent labs & imaging results that were available during my care of the patient were reviewed by me and considered  in my medical decision making (see chart for details).  Clinical Course as of Mar 26 1457  Mon Mar 26, 2020  1433 Sodium: 138 [HK]  1433 Creatinine: 0.98 [HK]    Clinical Course User Index [HK] Delia Heady, PA-C   MDM Rules/Calculators/A&P                          86 year old female presenting to the ED for hallucinations.  Daughter over the phone states that she has had progressive worsening forgetfulness and hallucinations over the past few months.  She is concerned because this morning it seemed to get worse, she stated that someone was out to kill her and she was throwing away her medications.  Patient admits that she does hallucinate.  She denies any other complaints.  She is alert alert and oriented here.  No numbness or weakness noted on exam.  Her vital signs are within normal limits.  Was seen and evaluated 3 days ago for similar symptoms with reassuring lab work and imaging of the head and abdomen.  I suspect this could be a behavioral disturbance from possible dementia.  Will obtain repeat medical screening labs and consult TTS as daughter is requesting a psych consult. Informed daughter that if she is cleared from a medical and psychiatric standpoint she will most likely need to follow-up outpatient with psychiatry and neurology for ongoing management of the symptoms.  I inquired about if they are seeking placement for the patient in a nursing facility but daughter declined. States  Lab work including CBC and BMP unremarkable.  She is pending a urinalysis and psych consult for disposition. Care handed off to oncoming team pending remainder of workup and final disposition.   Portions of this note were generated with [employer]. Dictation errors may occur despite best attempts at proofreading.  Final Clinical Impression(s) / ED Diagnoses Final diagnoses:  Hallucinations  Paranoia [facility])    Rx / DC Orders ED Discharge Orders    None       Delia Heady,  PA-C 03/26/20  Waller, [place], DO 03/26/20 1529

## 2020-03-26 NOTE — Telephone Encounter (Signed)
Appreciate check-in. Routing to scheduler to get f/u appointment for ER follow-up. Ideally 40 minutes if available - but would like to see her before the holiday

## 2020-03-27 ENCOUNTER — Ambulatory Visit: Payer: Medicare Other | Admitting: Family Medicine

## 2020-03-27 DIAGNOSIS — R443 Hallucinations, unspecified: Secondary | ICD-10-CM

## 2020-03-27 DIAGNOSIS — F22 Delusional disorders: Secondary | ICD-10-CM

## 2020-03-27 NOTE — ED Notes (Signed)
Called daughter for d/c. States she will come at lunch to get her

## 2020-03-27 NOTE — ED Notes (Signed)
Message left on VM for both daughters.

## 2020-03-27 NOTE — ED Provider Notes (Signed)
Patient cleared by psychiatry.  Lab work overall unremarkable.  Recommend continued outpatient work-up for possible dementia with neurology and primary care doctor.   Lennice Sites, DO 03/27/20 1022

## 2020-03-27 NOTE — ED Notes (Signed)
Called tony(gradson), [facility] states that Aquinittia (pts daughter) is on the way to pick up patient at this time.

## 2020-03-27 NOTE — Discharge Instructions (Addendum)
Follow-up with your primary care doctor.  Patient has been cleared by psychiatry.  Continue outpatient work-up with neurology and primary care doctor for possible dementia type symptoms.

## 2020-03-27 NOTE — Consult Note (Addendum)
Telepsych Consultation   Reason for Consult:  Psychosis Referring Physician:  Delia Heady, PA-C Location of Patient: [facility] ED Location of Provider: Other: [facility] [place] [facility]  Patient Identification: Ann Riddle MRN:  [id number] Principal Diagnosis: Psychosis [facility]) Diagnosis:  Principal Problem:   Psychosis ([place])   Total Time spent with patient: 30 minutes  Subjective:   Ann Riddle is a 86 y.o. female patient admitted to [facility] ED with complaints of having hallucinations and paranoia  HPI:  Ann Riddle, 86 y.o., female patient seen via tele psych by this provider, consulted with Dr. Dwyane Dee; and chart reviewed on 03/27/20.  On evaluation Ann Riddle is elevated up in bed.  She reports that she slept good last night.  When asked if she has been eating patient responded "I ain't had nothing to eat yet" referring to not yet having her breakfast.  Patient states she is feeling pretty good.  Patient denies having a bad dream last night and denies paranoia.  Patient states she lives with her daughter but hasn't spoken to her since she has been in the hospital.    During evaluation Ann Riddle is elevated up in bed.  She is alert x 3; She is calm/cooperative throughout assessment; and mood congruent with affect.  Patient is speaking in a clear tone at moderate volume, and normal pace, with good eye contact.  Her thought process is coherent and relevant; There is no indication that she is currently responding to internal/external stimuli or experiencing delusional thought content.  Patient denies suicidal/self-harm/homicidal ideation, psychosis, and paranoia.  Patient was able to give the current answer for current year, day of week, place, age, and DOB.  There are no noted abnormal or behavioral outburst since patient has been in hospital.  Patient psychiatrically cleared.  Abnormal Urinalysis could possibly be the start of another UTI.       Past Psychiatric History: No prior psychiatric history  Risk  to Self:  No Risk to Others:  No Prior Inpatient Therapy:  No Prior Outpatient Therapy:  No  Past Medical History:  Past Medical History:  Diagnosis Date  . Diaphragmatic hernia without mention of obstruction or gangrene   . Diverticulosis of colon (without mention of hemorrhage)   . Esophageal reflux   . Irritable bowel syndrome   . Other and unspecified hyperlipidemia   . Unspecified essential hypertension   . Ventral hernia, unspecified, without mention of obstruction or gangrene     Past Surgical History:  Procedure Laterality Date  . CHOLECYSTECTOMY    . CORONARY ANGIOGRAPHY N/A 06/01/2018   Procedure: CORONARY ANGIOGRAPHY;  Surgeon: Sherren Mocha, MD;  Location: [facility];  Service: Cardiovascular;  Laterality: N/A;  . HIATAL HERNIA REPAIR     Family History:  Family History  Problem Relation Age of Onset  . Breast cancer Daughter 64  . Colon cancer Neg Hx    Family Psychiatric  History: Unaware Social History:  Social History   Substance and Sexual Activity  Alcohol Use No     Social History   Substance and Sexual Activity  Drug Use No    Social History   Socioeconomic History  . Marital status: Widowed    Spouse name: Not on file  . Number of children: 4  . Years of education: Not on file  . Highest education level: Not on file  Occupational History  . Occupation: retired  Tobacco Use  . Smoking status: Never Smoker  . Smokeless tobacco:  Never Used  Vaping Use  . Vaping Use: Never used  Substance and Sexual Activity  . Alcohol use: No  . Drug use: No  . Sexual activity: Not on file  Other Topics Concern  . Not on file  Social History Narrative   01/30/20   From: [place], [place] - but moved from [place]: Living with Daughter    Work: Retired - Armed forces logistics/support/administrative officer      Family: lives with daughter - Penelope Coop, and has 2 other living daughters      Enjoys: not sure      Exercise: tries to walk as much as she can   Diet: good  Firefighter belts: Yes    Guns: Yes  and secure   Safe in relationships: Yes    Social Determinants of Health   Financial Resource Strain:   . Difficulty of Paying Living Expenses: Not on file  Food Insecurity:   . Worried About Charity fundraiser in the Last Year: Not on file  . Ran Out of Food in the Last Year: Not on file  Transportation Needs:   . Lack of Transportation (Medical): Not on file  . Lack of Transportation (Non-Medical): Not on file  Physical Activity:   . Days of Exercise per Week: Not on file  . Minutes of Exercise per Session: Not on file  Stress:   . Feeling of Stress : Not on file  Social Connections:   . Frequency of Communication with Friends and Family: Not on file  . Frequency of Social Gatherings with Friends and Family: Not on file  . Attends Religious Services: Not on file  . Active Member of Clubs or Organizations: Not on file  . Attends Archivist Meetings: Not on file  . Marital Status: Not on file   Additional Social History:    Allergies:  No Known Allergies  Labs:  Results for orders placed or performed during the hospital encounter of 03/26/20 (from the past 48 hour(s))  Basic metabolic panel     Status: Abnormal   Collection Time: 03/26/20  1:16 PM  Result Value Ref Range   Sodium 138 135 - 145 mmol/L   Potassium 3.9 3.5 - 5.1 mmol/L   Chloride 101 98 - 111 mmol/L   CO2 28 22 - 32 mmol/L   Glucose, Bld 106 (H) 70 - 99 mg/dL    Riddle: Glucose reference range applies only to samples taken after fasting for at least 8 hours.   BUN 18 8 - 23 mg/dL   Creatinine, Ser 0.98 0.44 - 1.00 mg/dL   Calcium 9.1 8.9 - 10.3 mg/dL   GFR, Estimated 53 (L) >60 mL/min    Riddle: (NOTE) Calculated using the CKD-EPI Creatinine Equation (2021)    Anion gap 9 5 - 15    Riddle: Performed at [facility], [place] [address] [place], [place] [address]  CBC with Differential     Status: Abnormal    Collection Time: 03/26/20  1:16 PM  Result Value Ref Range   WBC 3.4 (L) 4.0 - 10.5 K/uL   RBC 4.29 3.87 - 5.11 MIL/uL   Hemoglobin 12.7 12.0 - 15.0 g/dL   HCT 38.7 36 - 46 %   MCV 90.2 80.0 - 100.0 fL   MCH 29.6 26.0 - 34.0 pg   MCHC 32.8 30.0 - 36.0 g/dL   RDW 13.1 11.5 - 15.5 %  Platelets 201 150 - 400 K/uL   nRBC 0.0 0.0 - 0.2 %   Neutrophils Relative % 75 %   Neutro Abs 2.5 1.7 - 7.7 K/uL   Lymphocytes Relative 17 %   Lymphs Abs 0.6 (L) 0.7 - 4.0 K/uL   Monocytes Relative 8 %   Monocytes Absolute 0.3 0.1 - 1.0 K/uL   Eosinophils Relative 0 %   Eosinophils Absolute 0.0 0.0 - 0.5 K/uL   Basophils Relative 0 %   Basophils Absolute 0.0 0.0 - 0.1 K/uL   Immature Granulocytes 0 %   Abs Immature Granulocytes 0.01 0.00 - 0.07 K/uL    Riddle: Performed at [facility], [place] [address] [place], [place] [address]  TSH     Status: None   Collection Time: 03/26/20  4:43 PM  Result Value Ref Range   TSH 1.270 0.350 - 4.500 uIU/mL    Riddle: Performed by a 3rd Generation assay with a functional sensitivity of <=0.01 uIU/mL. Performed at [facility], [place] [address], [place], [place] [address]   Vitamin B12     Status: None   Collection Time: 03/26/20  4:43 PM  Result Value Ref Range   Vitamin B-12 599 180 - 914 pg/mL    Riddle: (NOTE) This assay is not validated for testing neonatal or myeloproliferative syndrome specimens for Vitamin B12 levels. Performed at [facility], [place] [address], [place], [place] [address]   Ammonia     Status: None   Collection Time: 03/26/20  4:43 PM  Result Value Ref Range   Ammonia 22 9 - 35 umol/L    Riddle: Performed at [facility], [place] [address] [place], [place] [address]  Folate, serum, performed at [facility] lab     Status: None   Collection Time: 03/26/20  4:44 PM  Result Value Ref Range   Folate 39.7 >5.9 ng/mL    Riddle: RESULTS CONFIRMED BY MANUAL  DILUTION Performed at [place] [address] [place], [place] [address]   Urinalysis, Routine w reflex microscopic     Status: Abnormal   Collection Time: 03/26/20  5:00 PM  Result Value Ref Range   Color, Urine YELLOW YELLOW   APPearance CLEAR CLEAR   Specific Gravity, Urine 1.013 1.005 - 1.030   pH 7.0 5.0 - 8.0   Glucose, UA NEGATIVE NEGATIVE mg/dL   Hgb urine dipstick NEGATIVE NEGATIVE   Bilirubin Urine NEGATIVE NEGATIVE   Ketones, ur NEGATIVE NEGATIVE mg/dL   Protein, ur NEGATIVE NEGATIVE mg/dL   Nitrite NEGATIVE NEGATIVE   Leukocytes,Ua SMALL (A) NEGATIVE   RBC / HPF 0-5 0 - 5 RBC/hpf   WBC, UA 0-5 0 - 5 WBC/hpf   Bacteria, UA RARE (A) NONE SEEN   Squamous Epithelial / LPF 0-5 0 - 5   Mucus PRESENT    Hyaline Casts, UA PRESENT     Riddle: Performed at [facility], [place] [address] [place], [place] [address]    Medications:  Current Facility-Administered Medications  Medication Dose Route Frequency Provider Last Rate Last Admin  . clonazePAM (KLONOPIN) tablet 0.5 mg  0.5 mg Oral QHS PRN Eman Morimoto B, NP      . escitalopram (LEXAPRO) tablet 5 mg  5 mg Oral Daily Isolde Skaff B, NP   5 mg at 03/27/20 0906  . OLANZapine zydis (ZYPREXA) disintegrating tablet 5 mg  5 mg Oral QHS Archit Leger B, NP   5 mg  at 03/26/20 2230   Current Outpatient Medications  Medication Sig Dispense Refill  . Acetaminophen (TYLENOL PO) Take 1 tablet by mouth daily as needed (pain).     Marland Kitchen aspirin EC 81 MG tablet Take 81 mg by mouth daily.    . clonazePAM (KLONOPIN) 0.5 MG tablet Take 1 tablet (0.5 mg total) by mouth at bedtime as needed for anxiety (sleep). (Patient taking differently: Take 0.5 mg by mouth at bedtime as needed for anxiety. ) 30 tablet 0  . colesevelam (WELCHOL) 625 MG tablet Take 1 tablet (625 mg total) by mouth daily. 90 tablet 1  . ENSURE (ENSURE) Take 237 mLs by mouth daily.    Marland Kitchen escitalopram (LEXAPRO) 5 MG tablet Take 1  tablet (5 mg total) by mouth daily. 30 tablet 2  . famotidine (PEPCID) 20 MG tablet Take 1 tablet (20 mg total) by mouth at bedtime. 30 tablet 3  . hyoscyamine (LEVSIN SL) 0.125 MG SL tablet Place 1 tablet (0.125 mg total) under the tongue every 8 (eight) hours as needed. (Patient taking differently: Place 0.125 mg under the tongue every 8 (eight) hours as needed for cramping. ) 60 tablet 5  . LINZESS 290 MCG CAPS capsule Take 290 mcg by mouth daily.    . Multiple Vitamin (MULTIVITAMIN WITH MINERALS) TABS tablet Take 1 tablet by mouth daily.    . nitroGLYCERIN (NITROSTAT) 0.4 MG SL tablet Place 1 tablet (0.4 mg total) under the tongue every 5 (five) minutes as needed for chest pain. 20 tablet 12  . olmesartan (BENICAR) 40 MG tablet Take 1 tablet (40 mg total) by mouth daily. 90 tablet 3  . pantoprazole (PROTONIX) 40 MG tablet Take 1 tablet (40 mg total) by mouth 2 (two) times daily before a meal. 180 tablet 2  . polyethylene glycol (MIRALAX / GLYCOLAX) packet Take 17 g by mouth daily. (Patient not taking: Reported on 03/26/2020)    . sucralfate (CARAFATE) 1 g tablet Take 1 tablet (1 g total) by mouth 4 (four) times daily -  with meals and at bedtime. (Patient not taking: Reported on 03/26/2020) 30 tablet 0    Musculoskeletal: Strength & Muscle Tone: Unable to determing via tele psych.  Moving around in bed without difficulty Gait & Station: Did not see patient ambulate Patient leans: N/A  Psychiatric Specialty Exam: Physical Exam Constitutional:      Appearance: Normal appearance.  Pulmonary:     Effort: Pulmonary effort is normal.  Musculoskeletal:     Cervical back: Normal range of motion.  Neurological:     General: No focal deficit present.     Mental Status: She is alert and oriented to person, place, and time.  Psychiatric:        Attention and Perception: Attention and perception normal. She does not perceive auditory or visual hallucinations.        Mood and Affect: Mood and  affect normal.        Speech: Speech normal.        Behavior: Behavior normal. Behavior is cooperative.        Thought Content: Thought content normal. Thought content is not paranoid or delusional. Thought content does not include homicidal or suicidal ideation.        Cognition and Memory: Cognition and memory normal.        Judgment: Judgment normal.     Review of Systems  Psychiatric/Behavioral: Negative for agitation, behavioral problems, confusion, hallucinations, self-injury, sleep disturbance and suicidal ideas. The patient is not  nervous/anxious.   All other systems reviewed and are negative.   Blood pressure (!) 109/51, pulse 60, temperature 98 F (36.7 C), resp. rate 20, height 5\' 1"  (1.549 m), weight 52.2 kg, SpO2 97 %.Body mass index is 21.74 kg/m.  General Appearance: Casual  Eye Contact:  Good  Speech:  Clear and Coherent and Normal Rate  Volume:  Normal  Mood:  Euthymic  Affect:  Appropriate and Congruent  Thought Process:  Coherent, Goal Directed and Descriptions of Associations: Intact  Orientation:  Full (Time, Place, and Person)  Thought Content:  WDL  Suicidal Thoughts:  No  Homicidal Thoughts:  No  Memory:  Immediate;   Good Recent;   Good  Judgement:  Intact  Insight:  Present  Psychomotor Activity:  Normal  Concentration:  Concentration: Good and Attention Span: Good  Recall:  Good  Fund of Knowledge:  Fair  Language:  Good  Akathisia:  No  Handed:  Right  AIMS (if indicated):     Assets:  Communication Skills Desire for Improvement Housing Resilience Social Support  ADL's:  Unable to determine  Cognition:  WNL  Sleep:      Treatment Plan Summary: Plan Psychiatrically clear  Disposition:  Psychiatrically cleared No evidence of imminent risk to self or others at present.   Patient does not meet criteria for psychiatric inpatient admission. Discussed crisis plan, support from social network, calling 911, coming to the Emergency Department,  and calling Suicide Hotline.  This service was provided via telemedicine using a 2-way, interactive audio and video technology.  Names of all persons participating in this telemedicine service and their role in this encounter. Name: Earleen Newport Role: NP  Name: Dr. Hampton Abbot Role: Psychiatrist  Name: Ann Riddle Role: Patient  Name:  Role:     Earleen Newport, NP 03/27/2020 9:25 AM

## 2020-03-27 NOTE — ED Notes (Signed)
Attempted to call daughter to ensure she is on the way so I can have patient ready. Called x2 and call forwarded to voicemail.

## 2020-03-27 NOTE — BH Assessment (Signed)
[place] Assessment Progress Note  Per Shuvon Rankin, NP, this voluntary pt does not require psychiatric hospitalization at this time.  Pt is psychiatrically cleared.  No behavioral health referrals are indicated for pt at this time.  EDP Lennice Sites, DO and pt's nurse, Caryl Pina, have been notified.  Jalene Mullet, [place] Triage Specialist [phone]

## 2020-04-02 ENCOUNTER — Other Ambulatory Visit: Payer: Self-pay

## 2020-04-02 ENCOUNTER — Ambulatory Visit (INDEPENDENT_AMBULATORY_CARE_PROVIDER_SITE_OTHER): Payer: Medicare Other | Admitting: Family Medicine

## 2020-04-02 VITALS — BP 120/58 | HR 100 | Temp 98.5°F | Wt 111.8 lb

## 2020-04-02 DIAGNOSIS — F03918 Unspecified dementia, unspecified severity, with other behavioral disturbance: Secondary | ICD-10-CM | POA: Insufficient documentation

## 2020-04-02 DIAGNOSIS — F0391 Unspecified dementia with behavioral disturbance: Secondary | ICD-10-CM

## 2020-04-02 MED ORDER — DONEPEZIL HCL 5 MG PO TABS: 5.0000 mg | ORAL_TABLET | Freq: Every day | ORAL | 1 refills | Status: DC

## 2020-04-02 MED ORDER — PANTOPRAZOLE SODIUM 40 MG PO TBEC: 40.0000 mg | DELAYED_RELEASE_TABLET | Freq: Two times a day (BID) | ORAL | 3 refills | Status: AC

## 2020-04-02 NOTE — Patient Instructions (Signed)
Start Donepezil 5 mg daily  Return here or with neurology in 2-4 weeks       #Referral I have placed a referral to a specialist for you. You should receive a phone call from the specialty office. Make sure your voicemail is not full and that if you are able to answer your phone to unknown or new numbers.   It may take up to 2 weeks to hear about the referral. If you do not hear anything in 2 weeks, please call our office and ask to speak with the referral coordinator.

## 2020-04-02 NOTE — Progress Notes (Signed)
Subjective:     Ann Riddle is a 86 y.o. female presenting for Follow-up (ED visit )     HPI   #hallucinations - day and night - [place] notes that she is sitting in her room alone  - family is watching her  - daughter watching her and she asked for scissors - gave her scissors and she had cut up money and prescription - said someone took her PJs off over night  Clonazepam does not seem to be working   Changes started happening last week   ER work-up has been normal  Daughter notes worsening memory concerns - repeating herself all day long   Review of Systems   03/23/2020: ER - behavior changes - paranoid, blaming family, running into the street - suspected dementia w/ behavioral changes.  03/26/2020: ER - hallucinations - UA negative. psych consult and cleared  Social History   Tobacco Use  Smoking Status Never Smoker  Smokeless Tobacco Never Used        Objective:    BP Readings from Last 3 Encounters:  04/02/20 (!) 120/58  03/27/20 116/60  03/23/20 (!) 168/71   Wt Readings from Last 3 Encounters:  04/02/20 111 lb 12 oz (50.7 kg)  03/26/20 115 lb 1.3 oz (52.2 kg)  03/23/20 115 lb (52.2 kg)    BP (!) 120/58   Pulse 100   Temp 98.5 F (36.9 C) (Temporal)   Wt 111 lb 12 oz (50.7 kg)   SpO2 98%   BMI 21.11 kg/m    Physical Exam Constitutional:      General: She is not in acute distress.    Appearance: She is well-developed. She is not diaphoretic.  HENT:     Right Ear: External ear normal.     Left Ear: External ear normal.     Nose: Nose normal.  Eyes:     Conjunctiva/sclera: Conjunctivae normal.  Cardiovascular:     Rate and Rhythm: Normal rate.  Pulmonary:     Effort: Pulmonary effort is normal.  Musculoskeletal:     Cervical back: Neck supple.  Skin:    General: Skin is warm and dry.     Capillary Refill: Capillary refill takes less than 2 seconds.  Neurological:     Mental Status: She is alert. Mental status is at baseline.    Psychiatric:        Mood and Affect: Mood normal.        Behavior: Behavior normal.    Montreal Cognitive Assessment  04/02/2020  Visuospatial/ Executive (0/5) 2  Naming (0/3) 1  Attention: Read list of digits (0/2) 0  Attention: Read list of letters (0/1) 1  Attention: Serial 7 subtraction starting at 100 (0/3) 0  Language: Repeat phrase (0/2) 1  Language : Fluency (0/1) 0  Abstraction (0/2) 2  Delayed Recall (0/5) 1  Orientation (0/6) 5  Total 13         Assessment & Plan:   Problem List Items Addressed This Visit      Nervous and Auditory   Dementia with behavioral disturbance ([place]) - Primary    Newly diagnosed dementia with paranoia and confusion resulting in 2 recent ER visits. Reviewed normal ER work-up including CT head and labs which were normal. At this point family is looking into higher level of care but current options are too expensive. Behavior has worsened recently and as lexapro was new discussed stopping and starting Donepezil 5 mg. Urgent referral to neurology for additional  support.       Relevant Medications   donepezil (ARICEPT) 5 MG tablet   Other Relevant Orders   Ambulatory referral to Neurology       Return in about 3 weeks (around 04/23/2020).  Lesleigh Noe, MD  This visit occurred during the SARS-CoV-2 public health emergency.  Safety protocols were in place, including screening questions prior to the visit, additional usage of staff PPE, and extensive cleaning of exam room while observing appropriate contact time as indicated for disinfecting solutions.

## 2020-04-02 NOTE — Assessment & Plan Note (Addendum)
Newly diagnosed dementia with paranoia and confusion resulting in 2 recent ER visits. Reviewed normal ER work-up including CT head and labs which were normal. At this point family is looking into higher level of care but current options are too expensive. Behavior has worsened recently and as lexapro was new discussed stopping and starting Donepezil 5 mg. Urgent referral to neurology for additional support.

## 2020-04-04 ENCOUNTER — Encounter: Payer: Self-pay | Admitting: *Deleted

## 2020-04-04 ENCOUNTER — Ambulatory Visit: Payer: Medicare Other | Admitting: Diagnostic Neuroimaging

## 2020-04-04 VITALS — BP 177/82 | HR 86 | Ht 62.0 in | Wt 112.4 lb

## 2020-04-04 DIAGNOSIS — F0391 Unspecified dementia with behavioral disturbance: Secondary | ICD-10-CM | POA: Diagnosis not present

## 2020-04-04 DIAGNOSIS — F03B18 Unspecified dementia, moderate, with other behavioral disturbance: Secondary | ICD-10-CM

## 2020-04-04 MED ORDER — OLANZAPINE 5 MG PO TBDP: 5.0000 mg | ORAL_TABLET | Freq: Every day | ORAL | 12 refills | Status: DC

## 2020-04-04 NOTE — Progress Notes (Signed)
[facility]  PATIENT: Ann Riddle DOB: Sep 29, 1921  REFERRING CLINICIAN: Lesleigh Noe, MD HISTORY FROM: patient and daughter  REASON FOR VISIT: new consult    HISTORICAL  CHIEF COMPLAINT:  Chief Complaint  Patient presents with  . Dementia    rm 7 New Pt, dgtr- Aquinittia MMSE 14    HISTORY OF PRESENT ILLNESS:   86 year old female here for evaluation of dementia.  For past 5 years patient has had gradual onset progressive short-term memory loss confusion.  In the past 4 to 6 months patient has had increasing hallucinations and paranoia.  She has had several emergency room visits for psychosis.  She was diagnosed with probable dementia and evaluated by psychiatry.  She has been living with her daughter for the past 4 months.  She is argumentative and accusatory towards her daughter.  She has poor insight.  She has been on Lexapro and Klonopin without benefit.  She was recommended to start Zyprexa in the hospital by psychiatry but this was not prescribed or started.   REVIEW OF SYSTEMS: Full 14 system review of systems performed and negative with exception of: As per HPI.  ALLERGIES: No Known Allergies  HOME MEDICATIONS: Outpatient Medications Prior to Visit  Medication Sig Dispense Refill  . Acetaminophen (TYLENOL PO) Take 1 tablet by mouth daily as needed (pain).     Marland Kitchen aspirin EC 81 MG tablet Take 81 mg by mouth daily.    . clonazePAM (KLONOPIN) 0.5 MG tablet Take 1 tablet (0.5 mg total) by mouth at bedtime as needed for anxiety (sleep). (Patient taking differently: Take 0.5 mg by mouth at bedtime as needed for anxiety. ) 30 tablet 0  . colesevelam (WELCHOL) 625 MG tablet Take 1 tablet (625 mg total) by mouth daily. 90 tablet 1  . donepezil (ARICEPT) 5 MG tablet Take 1 tablet (5 mg total) by mouth at bedtime. 30 tablet 1  . ENSURE (ENSURE) Take 237 mLs by mouth daily.    . famotidine (PEPCID) 20 MG tablet Take 1 tablet (20 mg total) by mouth at bedtime.  30 tablet 3  . hyoscyamine (LEVSIN SL) 0.125 MG SL tablet Place 1 tablet (0.125 mg total) under the tongue every 8 (eight) hours as needed. (Patient taking differently: Place 0.125 mg under the tongue every 8 (eight) hours as needed for cramping. ) 60 tablet 5  . LINZESS 290 MCG CAPS capsule Take 290 mcg by mouth daily.    . Multiple Vitamin (MULTIVITAMIN WITH MINERALS) TABS tablet Take 1 tablet by mouth daily.    . nitroGLYCERIN (NITROSTAT) 0.4 MG SL tablet Place 1 tablet (0.4 mg total) under the tongue every 5 (five) minutes as needed for chest pain. 20 tablet 12  . olmesartan (BENICAR) 40 MG tablet Take 1 tablet (40 mg total) by mouth daily. 90 tablet 3  . pantoprazole (PROTONIX) 40 MG tablet Take 1 tablet (40 mg total) by mouth 2 (two) times daily before a meal. 180 tablet 3  . polyethylene glycol (MIRALAX / GLYCOLAX) packet Take 17 g by mouth daily as needed.      No facility-administered medications prior to visit.    PAST MEDICAL HISTORY: Past Medical History:  Diagnosis Date  . Dementia ([place])   . Diaphragmatic hernia without mention of obstruction or gangrene   . Diverticulosis of colon (without mention of hemorrhage)   . Esophageal reflux   . Irritable bowel syndrome   . Other and unspecified hyperlipidemia   . Unspecified essential hypertension   .  Ventral hernia, unspecified, without mention of obstruction or gangrene     PAST SURGICAL HISTORY: Past Surgical History:  Procedure Laterality Date  . CHOLECYSTECTOMY    . CORONARY ANGIOGRAPHY N/A 06/01/2018   Procedure: CORONARY ANGIOGRAPHY;  Surgeon: Sherren Mocha, MD;  Location: [facility];  Service: Cardiovascular;  Laterality: N/A;  . HIATAL HERNIA REPAIR      FAMILY HISTORY: Family History  Problem Relation Age of Onset  . Breast cancer Daughter 84  . Colon cancer Neg Hx     SOCIAL HISTORY: Social History   Socioeconomic History  . Marital status: Widowed    Spouse name: Not on file  . Number of  children: 4  . Years of education: Not on file  . Highest education level: High school graduate  Occupational History  . 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 Smoking status: Never Smoker  . Smokeless tobacco: Never Used  Vaping Use  . Vaping Use: Never used  Substance and Sexual Activity  . Alcohol use: No  . Drug use: No  . Sexual activity: Not on file  Other Topics Concern  . 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 Difficulty of Paying Living Expenses: Not on file  Food Insecurity:   . Worried About Charity fundraiser in the Last Year: Not on file  . Ran Out of Food in the Last Year: Not on file  Transportation Needs:   . Lack of Transportation (Medical): Not on file  . Lack of Transportation (Non-Medical): Not on file  Physical Activity:   . Days of Exercise per Week: Not on file  . Minutes of Exercise per Session: Not on file  Stress:   . Feeling of Stress : Not on file  Social Connections:   . Frequency of Communication with Friends and Family: Not on file  . Frequency of Social Gatherings with Friends and Family: Not on file  . Attends Religious Services: Not on file  . Active Member of Clubs or Organizations: Not on file  . Attends Archivist Meetings: Not on file  . Marital Status: Not on file  Intimate Partner Violence:   . Fear of Current or Ex-Partner: Not on file  . Emotionally Abused: Not on file  . Physically Abused: Not on file  . Sexually Abused: Not on  file     PHYSICAL EXAM  GENERAL EXAM/CONSTITUTIONAL: Vitals:  Vitals:   04/04/20 1428  BP: (!) 177/82  Pulse: 86  Weight: 112 lb 6.4 oz (51 kg)  Height: 5\' 2"  (1.575 m)     Body mass index is 20.56 kg/m. Wt Readings from Last 3 Encounters:  04/04/20 112 lb 6.4 oz (51 kg)  04/02/20 111 lb 12 oz (50.7 kg)  03/26/20 115 lb 1.3 oz (52.2 kg)  Patient is in no distress; well developed, nourished and groomed; neck is supple  CARDIOVASCULAR:  Examination of carotid arteries is normal; no carotid bruits  Regular rate and rhythm, no murmurs  Examination of peripheral vascular system by observation and palpation is normal  EYES:  Ophthalmoscopic exam of optic discs and posterior segments is normal; no papilledema or hemorrhages  No exam data present  MUSCULOSKELETAL:  Gait, strength, tone, movements noted in Neurologic exam below  NEUROLOGIC: MENTAL STATUS:  MMSE - [place] Exam 04/04/2020  Orientation to time 2  Orientation to Place 3  Registration 2  Attention/ Calculation 0  Recall 1  Language- name 2 objects 2  Language- repeat 0  Language- follow 3 step command 3  Language- read & follow direction 1  Write a sentence 0  Copy design 0  Total score 14    awake, alert, oriented to person  [facility] memory   DECR attention and concentration  language fluent, comprehension intact, naming intact  fund of knowledge LIMITED   POOR INSIGHT  CRANIAL NERVE:   2nd - no papilledema on fundoscopic exam  2nd, 3rd, 4th, 6th - pupils equal and reactive to light, visual fields full to confrontation, extraocular muscles intact, no nystagmus  5th - facial sensation symmetric  7th - facial strength symmetric  8th - hearing intact  9th - palate elevates symmetrically, uvula midline  11th - shoulder shrug symmetric  12th - tongue protrusion midline  MOTOR:   normal bulk and tone, full strength in the BUE, BLE  SENSORY:   normal and symmetric  to light touch, temperature, vibration  COORDINATION:   finger-nose-finger, fine finger movements normal  REFLEXES:   deep tendon reflexes present and symmetric  GAIT/STATION:   narrow based gait     DIAGNOSTIC DATA (LABS, IMAGING, TESTING) - I reviewed patient records, labs, notes, testing and imaging myself where available.  Lab Results  Component Value Date   WBC 3.4 (L) 03/26/2020   HGB 12.7 03/26/2020   HCT 38.7 03/26/2020   MCV 90.2 03/26/2020   PLT 201 03/26/2020      Component Value Date/Time   NA 138 03/26/2020 1316   K 3.9 03/26/2020 1316   CL 101 03/26/2020 1316   CO2 28 03/26/2020 1316   GLUCOSE 106 (H) 03/26/2020 1316   BUN 18 03/26/2020 1316   CREATININE 0.98 03/26/2020 1316   CALCIUM 9.1 03/26/2020 1316   PROT 7.0 03/23/2020 1627   ALBUMIN 4.4 03/23/2020 1627   AST 25 03/23/2020 1627   ALT 17 03/23/2020 1627   ALKPHOS 71 03/23/2020 1627   BILITOT 0.8 03/23/2020 1627   GFRNONAA 53 (L) 03/26/2020 1316   GFRAA 59 (L) 11/23/2019 1811   Lab Results  Component Value Date   CHOL  02/06/2010    187        ATP III CLASSIFICATION:  <200     mg/dL   Desirable  200-239  mg/dL   Borderline High  >=240    mg/dL   High          HDL 62 02/06/2010   LDLCALC (H) 02/06/2010    107        Total Cholesterol/HDL:CHD Risk Coronary Heart Disease Risk Table                     Men   Women  1/2 Average Risk   3.4   3.3  Average Risk       5.0  4.4  2 X Average Risk   9.6   7.1  3 X Average Risk  23.4   11.0        Use the calculated Patient Ratio above and the CHD Risk Table to determine the patient's CHD Risk.        ATP III CLASSIFICATION (LDL):  <100     mg/dL   Optimal  100-129  mg/dL   Near or Above                    Optimal  130-159  mg/dL   Borderline  160-189  mg/dL   High  >190     mg/dL   Very High   TRIG 92 02/06/2010   CHOLHDL 3.0 02/06/2010   No results found for: HGBA1C Lab Results  Component Value Date   VITAMINB12 599  03/26/2020   Lab Results  Component Value Date   TSH 1.270 03/26/2020    03/23/20 CT head  1. No acute intracranial findings. 2. Chronic microvascular angiopathy and parenchymal volume loss.    ASSESSMENT AND PLAN  86 y.o. year old female here with:  Dx:  1. Moderate dementia with behavioral disturbance (HCC)     PLAN:  MODERATE DEMENTIA WITH BEHAVIORAL DISTURBANCE (paranoia and hallucinations) - start olanzapine 5mg  at bedtime - continue lexapro and klonopin - stop donepezil (not likely to benefit) - safety / supervision issues reviewed - daily physical activity / exercise (at least 15-30 minutes) - eat more plants / vegetables - increase social activities, brain stimulation, games, puzzles, hobbies, crafts, arts, music - caregiver resources provided - no driving; cannot handle medications / finances - follow up with PCP and psychiatry  Meds ordered this encounter  Medications  . OLANZapine zydis (ZYPREXA) 5 MG disintegrating tablet    Sig: Take 1 tablet (5 mg total) by mouth at bedtime.    Dispense:  30 tablet    Refill:  12   Return for return to PCP.    Penni Bombard, MD 16/05/958, 4:54 PM Certified in Neurology, Neurophysiology and Neuroimaging  [facility] [address], [place], [place] [address] [phone]

## 2020-04-04 NOTE — Patient Instructions (Signed)
  MODERATE DEMENTIA WITH BEHAVIORAL DISTURBANCE (paranoia and hallucinations) - start olanzapine 5mg  at bedtime - continue lexapro and klonopin - stop donepezil (not likely to benefit) - safety / supervision issues reviewed - daily physical activity / exercise (at least 15-30 minutes) - eat more plants / vegetables - increase social activities, brain stimulation, games, puzzles, hobbies, crafts, arts, music - caregiver resources provided - no driving; cannot handle medications / finances

## 2020-04-07 ENCOUNTER — Emergency Department (HOSPITAL_COMMUNITY): Payer: Medicare Other

## 2020-04-07 ENCOUNTER — Observation Stay (HOSPITAL_COMMUNITY): Payer: Medicare Other

## 2020-04-07 ENCOUNTER — Encounter (HOSPITAL_COMMUNITY): Payer: Self-pay

## 2020-04-07 ENCOUNTER — Other Ambulatory Visit: Payer: Self-pay

## 2020-04-07 ENCOUNTER — Observation Stay (HOSPITAL_COMMUNITY)
Admission: EM | Admit: 2020-04-07 | Discharge: 2020-04-08 | Disposition: A | Discharge status: Home / Self Care | Payer: Medicare Other | Attending: Emergency Medicine | Admitting: Emergency Medicine

## 2020-04-07 DIAGNOSIS — Z7982 Long term (current) use of aspirin: Secondary | ICD-10-CM | POA: Insufficient documentation

## 2020-04-07 DIAGNOSIS — G9341 Metabolic encephalopathy: Secondary | ICD-10-CM | POA: Diagnosis not present

## 2020-04-07 DIAGNOSIS — R55 Syncope and collapse: Secondary | ICD-10-CM | POA: Diagnosis present

## 2020-04-07 DIAGNOSIS — F0391 Unspecified dementia with behavioral disturbance: Secondary | ICD-10-CM | POA: Diagnosis not present

## 2020-04-07 DIAGNOSIS — E162 Hypoglycemia, unspecified: Secondary | ICD-10-CM | POA: Diagnosis present

## 2020-04-07 DIAGNOSIS — E870 Hyperosmolality and hypernatremia: Secondary | ICD-10-CM | POA: Insufficient documentation

## 2020-04-07 DIAGNOSIS — Z955 Presence of coronary angioplasty implant and graft: Secondary | ICD-10-CM | POA: Insufficient documentation

## 2020-04-07 DIAGNOSIS — F03918 Unspecified dementia, unspecified severity, with other behavioral disturbance: Secondary | ICD-10-CM | POA: Diagnosis present

## 2020-04-07 DIAGNOSIS — E876 Hypokalemia: Secondary | ICD-10-CM | POA: Diagnosis not present

## 2020-04-07 DIAGNOSIS — D649 Anemia, unspecified: Secondary | ICD-10-CM | POA: Insufficient documentation

## 2020-04-07 DIAGNOSIS — K5900 Constipation, unspecified: Secondary | ICD-10-CM | POA: Diagnosis not present

## 2020-04-07 DIAGNOSIS — K219 Gastro-esophageal reflux disease without esophagitis: Secondary | ICD-10-CM | POA: Diagnosis present

## 2020-04-07 DIAGNOSIS — E785 Hyperlipidemia, unspecified: Secondary | ICD-10-CM | POA: Diagnosis present

## 2020-04-07 DIAGNOSIS — Z79899 Other long term (current) drug therapy: Secondary | ICD-10-CM | POA: Diagnosis not present

## 2020-04-07 DIAGNOSIS — I10 Essential (primary) hypertension: Secondary | ICD-10-CM | POA: Diagnosis not present

## 2020-04-07 DIAGNOSIS — Z20822 Contact with and (suspected) exposure to covid-19: Secondary | ICD-10-CM | POA: Diagnosis not present

## 2020-04-07 LAB — I-STAT CHEM 8, ED
BUN: 9 mg/dL (ref 8–23)
Calcium, Ion: 0.83 mmol/L — CL (ref 1.15–1.40)
Chloride: 114 mmol/L — ABNORMAL HIGH (ref 98–111)
Creatinine, Ser: 0.4 mg/dL — ABNORMAL LOW (ref 0.44–1.00)
Glucose, Bld: 57 mg/dL — ABNORMAL LOW (ref 70–99)
HCT: 19 % — ABNORMAL LOW (ref 36.0–46.0)
Hemoglobin: 6.5 g/dL — CL (ref 12.0–15.0)
Potassium: 2.2 mmol/L — CL (ref 3.5–5.1)
Sodium: 147 mmol/L — ABNORMAL HIGH (ref 135–145)
TCO2: 18 mmol/L — ABNORMAL LOW (ref 22–32)

## 2020-04-07 LAB — HEMOGLOBIN AND HEMATOCRIT, BLOOD
HCT: 41 % (ref 36.0–46.0)
Hemoglobin: 14.1 g/dL (ref 12.0–15.0)

## 2020-04-07 LAB — RESP PANEL BY RT-PCR (FLU A&B, COVID) ARPGX2
Influenza A by PCR: NEGATIVE
Influenza B by PCR: NEGATIVE
SARS Coronavirus 2 by RT PCR: NEGATIVE

## 2020-04-07 LAB — CBC WITH DIFFERENTIAL/PLATELET
Abs Immature Granulocytes: 0.01 10*3/uL (ref 0.00–0.07)
Basophils Absolute: 0 10*3/uL (ref 0.0–0.1)
Basophils Relative: 1 %
Eosinophils Absolute: 0 10*3/uL (ref 0.0–0.5)
Eosinophils Relative: 1 %
HCT: 36.4 % (ref 36.0–46.0)
Hemoglobin: 11.7 g/dL — ABNORMAL LOW (ref 12.0–15.0)
Immature Granulocytes: 0 %
Lymphocytes Relative: 27 %
Lymphs Abs: 0.7 10*3/uL (ref 0.7–4.0)
MCH: 29.8 pg (ref 26.0–34.0)
MCHC: 32.1 g/dL (ref 30.0–36.0)
MCV: 92.6 fL (ref 80.0–100.0)
Monocytes Absolute: 0.2 10*3/uL (ref 0.1–1.0)
Monocytes Relative: 9 %
Neutro Abs: 1.7 10*3/uL (ref 1.7–7.7)
Neutrophils Relative %: 62 %
Platelets: 179 10*3/uL (ref 150–400)
RBC: 3.93 MIL/uL (ref 3.87–5.11)
RDW: 13.1 % (ref 11.5–15.5)
WBC: 2.7 10*3/uL — ABNORMAL LOW (ref 4.0–10.5)
nRBC: 0 % (ref 0.0–0.2)

## 2020-04-07 LAB — HEPATIC FUNCTION PANEL
ALT: 9 U/L (ref 0–44)
AST: 18 U/L (ref 15–41)
Albumin: 2.3 g/dL — ABNORMAL LOW (ref 3.5–5.0)
Alkaline Phosphatase: 31 U/L — ABNORMAL LOW (ref 38–126)
Bilirubin, Direct: 0.2 mg/dL (ref 0.0–0.2)
Indirect Bilirubin: 0.2 mg/dL — ABNORMAL LOW (ref 0.3–0.9)
Total Bilirubin: 0.4 mg/dL (ref 0.3–1.2)
Total Protein: 3.9 g/dL — ABNORMAL LOW (ref 6.5–8.1)

## 2020-04-07 LAB — BASIC METABOLIC PANEL
Anion gap: 5 (ref 5–15)
Anion gap: 11 (ref 5–15)
BUN: 13 mg/dL (ref 8–23)
BUN: 14 mg/dL (ref 8–23)
CO2: 20 mmol/L — ABNORMAL LOW (ref 22–32)
CO2: 20 mmol/L — ABNORMAL LOW (ref 22–32)
Calcium: 5.7 mg/dL — CL (ref 8.9–10.3)
Calcium: 9.2 mg/dL (ref 8.9–10.3)
Chloride: 121 mmol/L — ABNORMAL HIGH (ref 98–111)
Chloride: 107 mmol/L (ref 98–111)
Creatinine, Ser: 0.52 mg/dL (ref 0.44–1.00)
Creatinine, Ser: 0.89 mg/dL (ref 0.44–1.00)
GFR, Estimated: >60 mL/min (ref >60)
GFR, Estimated: 59 mL/min — ABNORMAL LOW (ref >60)
Glucose, Bld: 72 mg/dL (ref 70–99)
Glucose, Bld: 93 mg/dL (ref 70–99)
Potassium: 2.8 mmol/L — ABNORMAL LOW (ref 3.5–5.1)
Potassium: 6 mmol/L — ABNORMAL HIGH (ref 3.5–5.1)
Sodium: 146 mmol/L — ABNORMAL HIGH (ref 135–145)
Sodium: 138 mmol/L (ref 135–145)

## 2020-04-07 LAB — CBG MONITORING, ED
Glucose-Capillary: 92 mg/dL (ref 70–99)
Glucose-Capillary: 93 mg/dL (ref 70–99)

## 2020-04-07 LAB — MAGNESIUM: Magnesium: 2.3 mg/dL (ref 1.7–2.4)

## 2020-04-07 LAB — GLUCOSE, CAPILLARY: Glucose-Capillary: 86 mg/dL (ref 70–99)

## 2020-04-07 LAB — POTASSIUM: Potassium: 4.8 mmol/L (ref 3.5–5.1)

## 2020-04-07 MED ORDER — POLYETHYLENE GLYCOL 3350 17 G PO PACK: 17.0000 g | PACK | Freq: Every day | ORAL | Status: DC | PRN

## 2020-04-07 MED ORDER — LINACLOTIDE 145 MCG PO CAPS
290.0000 ug | ORAL_CAPSULE | Freq: Every day | ORAL | Status: DC
  Filled 2020-04-07: qty 2

## 2020-04-07 MED ORDER — SODIUM CHLORIDE 0.9 % IV BOLUS: 250.0000 mL | Freq: Once | INTRAVENOUS | Status: DC

## 2020-04-07 MED ORDER — ONDANSETRON HCL 4 MG PO TABS: 4.0000 mg | ORAL_TABLET | Freq: Four times a day (QID) | ORAL | Status: DC | PRN

## 2020-04-07 MED ORDER — IOHEXOL 300 MG/ML  SOLN
100.0000 mL | Freq: Once | INTRAMUSCULAR | Status: AC | PRN
  Administered 2020-04-07: 100 mL via INTRAVENOUS

## 2020-04-07 MED ORDER — FAMOTIDINE 20 MG PO TABS
20.0000 mg | ORAL_TABLET | Freq: Every day | ORAL | Status: DC
  Administered 2020-04-07: 20 mg via ORAL
  Filled 2020-04-07: qty 1

## 2020-04-07 MED ORDER — ACETAMINOPHEN 325 MG PO TABS: 650.0000 mg | ORAL_TABLET | Freq: Four times a day (QID) | ORAL | Status: DC | PRN

## 2020-04-07 MED ORDER — ONDANSETRON HCL 4 MG/2ML IJ SOLN: 4.0000 mg | Freq: Four times a day (QID) | INTRAMUSCULAR | Status: DC | PRN

## 2020-04-07 MED ORDER — CALCIUM GLUCONATE-NACL 1-0.675 GM/50ML-% IV SOLN
1.0000 g | Freq: Once | INTRAVENOUS | Status: AC
  Administered 2020-04-07: 1000 mg via INTRAVENOUS
  Filled 2020-04-07: qty 50

## 2020-04-07 MED ORDER — POTASSIUM CHLORIDE 2 MEQ/ML IV SOLN
INTRAVENOUS | Status: DC
  Filled 2020-04-07 (×4): qty 1000

## 2020-04-07 MED ORDER — SODIUM CHLORIDE 0.9 % IV SOLN
INTRAVENOUS | Status: DC | PRN
  Administered 2020-04-07: 1000 mL via INTRAVENOUS

## 2020-04-07 MED ORDER — HALOPERIDOL LACTATE 5 MG/ML IJ SOLN: 1.0000 mg | Freq: Four times a day (QID) | INTRAMUSCULAR | Status: DC | PRN

## 2020-04-07 MED ORDER — PANTOPRAZOLE SODIUM 40 MG PO TBEC
40.0000 mg | DELAYED_RELEASE_TABLET | Freq: Two times a day (BID) | ORAL | Status: DC
  Administered 2020-04-08: 40 mg via ORAL
  Filled 2020-04-07: qty 1

## 2020-04-07 MED ORDER — POTASSIUM CHLORIDE 10 MEQ/100ML IV SOLN
10.0000 meq | INTRAVENOUS | Status: AC
  Administered 2020-04-07 (×3): 10 meq via INTRAVENOUS
  Filled 2020-04-07 (×3): qty 100

## 2020-04-07 MED ORDER — DEXTROSE 50 % IV SOLN: 1.0000 | Freq: Once | INTRAVENOUS | Status: DC

## 2020-04-07 MED ORDER — HYOSCYAMINE SULFATE 0.125 MG SL SUBL
0.1250 mg | SUBLINGUAL_TABLET | Freq: Three times a day (TID) | SUBLINGUAL | Status: DC | PRN
  Filled 2020-04-07: qty 1

## 2020-04-07 MED ORDER — ACETAMINOPHEN 650 MG RE SUPP: 650.0000 mg | Freq: Four times a day (QID) | RECTAL | Status: DC | PRN

## 2020-04-07 MED ORDER — POTASSIUM CHLORIDE 10 MEQ/100ML IV SOLN
10.0000 meq | INTRAVENOUS | Status: DC
  Administered 2020-04-07: 10 meq via INTRAVENOUS
  Filled 2020-04-07 (×2): qty 100

## 2020-04-07 MED ORDER — NITROGLYCERIN 0.4 MG SL SUBL: 0.4000 mg | SUBLINGUAL_TABLET | SUBLINGUAL | Status: DC | PRN

## 2020-04-07 MED ORDER — METOPROLOL TARTRATE 5 MG/5ML IV SOLN: 5.0000 mg | Freq: Four times a day (QID) | INTRAVENOUS | Status: DC | PRN

## 2020-04-07 MED ORDER — COLESEVELAM HCL 625 MG PO TABS
625.0000 mg | ORAL_TABLET | Freq: Every day | ORAL | Status: DC
  Filled 2020-04-07: qty 1

## 2020-04-07 NOTE — ED Triage Notes (Signed)
EMS reports from home, daughter states AMS and lethargic, not responding to baseline. Sleeping more than normal, did not rouse at normal time today stating to daughter "just let me go".  BP 159/82 HR 84 RR 18 Sp02 97 RA CBG 125  20 RAC

## 2020-04-07 NOTE — ED Notes (Signed)
Date and time results received: 04/07/20 3:18 PM  (use smartphrase ".now" to insert current time)  Test: Ca++  Critical Value: 5.7  Name of Provider Notified: Collier Salina. PA Orders Received? Or Actions Taken?: Actions Taken: Notified Arts development officer and PA

## 2020-04-07 NOTE — ED Provider Notes (Addendum)
[facility] DEPT Provider Note   CSN: [id number] Arrival date & time: 04/07/20  1154     History Chief Complaint  Patient presents with  . Altered Mental Status  . Fatigue    Ann Riddle is a 86 y.o. female with a history of dementia, hypertension, diverticulosis, GERD, hallucinations and paranoia. Was brought to the emergency department today via EMS. Patient's daughter Ann Riddle was contacted via phone and reports that this morning her mother was hard to rouse from sleep, which is abnormal for her. She also reports that yesterday the patient had a syncopal episode while transferring positions, the patient did not fall or hit her head during the fall, exact time of LOC was unknown, after the patient regained consciousness she had no further syncopal episodes and was acting appropriately. Ms. Luberta Riddle reports that patient's baseline mental status is person place and time intermittently.  Per patient's daughter she has only had 2 doses of her zyprexa.    Per patient she reports that last night she could not sleep because she was worried about being placed in a nursing home.  Patient does not recall being woken this morning.  Patient denies any suicidal ideations, homicidal ideations, or hallucinations.  Patient complains of lower left quadrant abdominal pain, patient reports the pain is constant, started bothering her today cannot give an exact time.  Patient reports that she does have history of hernias and previous hernia repair.  Patient denies any fever, chills, chest pain, shortness of breath, nausea, vomiting, or dysuria.   HPI     Past Medical History:  Diagnosis Date  . Dementia ([place])   . Diaphragmatic hernia without mention of obstruction or gangrene   . Diverticulosis of colon (without mention of hemorrhage)   . Esophageal reflux   . Irritable bowel syndrome   . Other and unspecified hyperlipidemia   . Unspecified essential  hypertension   . Ventral hernia, unspecified, without mention of obstruction or gangrene     Patient Active Problem List   Diagnosis Date Noted  . Hypokalemia 04/07/2020  . Hypocalcemia 04/07/2020  . Hypoglycemia 04/07/2020  . Acute metabolic encephalopathy 26/33/3545  . Dementia with behavioral disturbance ([place]) 04/02/2020  . Hallucinations   . Paranoia ([place])   . Psychosis ([place])   . Atherosclerosis of aorta ([place]) 02/29/2020  . Vaginal lesion 02/29/2020  . Osteopenia 02/22/2020  . Insomnia 01/30/2020  . Current moderate episode of major depressive disorder without prior episode ([place]) 01/30/2020  . Generalized anxiety disorder 01/30/2020  . Porokeratosis 10/26/2019  . Abnormal nuclear cardiac imaging test   . Chest pain 11/13/2015  . Constipation 11/10/2007  . Hyperlipidemia 08/04/2007  . Essential hypertension 08/04/2007  . GERD 08/04/2007  . IBS 08/04/2007    Past Surgical History:  Procedure Laterality Date  . CHOLECYSTECTOMY    . CORONARY ANGIOGRAPHY N/A 06/01/2018   Procedure: CORONARY ANGIOGRAPHY;  Surgeon: Sherren Mocha, MD;  Location: [facility];  Service: Cardiovascular;  Laterality: N/A;  . HIATAL HERNIA REPAIR       OB History   No obstetric history on file.     Family History  Problem Relation Age of Onset  . Breast cancer Daughter 24  . Colon cancer Neg Hx     Social History   Tobacco Use  . Smoking status: Never Smoker  . Smokeless tobacco: Never Used  Vaping Use  . Vaping Use: Never used  Substance Use Topics  . Alcohol use: No  .  Drug use: No    Home Medications Prior to Admission medications   Medication Sig Start Date End Date Taking? Authorizing Provider  acetaminophen (TYLENOL) 650 MG CR tablet Take 650 mg by mouth daily as needed (headache).   Yes [provider]  aspirin EC 81 MG tablet Take 81 mg by mouth daily.   Yes [provider]  clonazePAM (KLONOPIN) 0.5 MG tablet Take 1 tablet (0.5 mg total) by  mouth at bedtime as needed for anxiety (sleep). Patient taking differently: Take 0.5 mg by mouth at bedtime as needed for anxiety.  02/20/20  Yes Lesleigh Noe, MD  colesevelam [facility]) 625 MG tablet Take 1 tablet (625 mg total) by mouth daily. 02/16/20  Yes Milus Banister, MD  ENSURE (ENSURE) Take 237 mLs by mouth See admin instructions. Drink one can (237 mls) by mouth once or twice daily   Yes [provider]  famotidine (PEPCID) 20 MG tablet Take 1 tablet (20 mg total) by mouth at bedtime. 03/20/20  Yes Milus Banister, MD  hyoscyamine (LEVSIN SL) 0.125 MG SL tablet Place 1 tablet (0.125 mg total) under the tongue every 8 (eight) hours as needed. Patient taking differently: Place 0.125 mg under the tongue every 8 (eight) hours as needed (chest pains/acid reflux).  06/01/19  Yes Milus Banister, MD  LINZESS 290 MCG CAPS capsule Take 290 mcg by mouth daily. 10/26/19  Yes [provider]  Multiple Vitamin (MULTIVITAMIN WITH MINERALS) TABS tablet Take 1 tablet by mouth daily.   Yes [provider]  nitroGLYCERIN (NITROSTAT) 0.4 MG SL tablet Place 1 tablet (0.4 mg total) under the tongue every 5 (five) minutes as needed for chest pain. 11/14/15  Yes Thurnell Lose, MD  OLANZapine zydis (ZYPREXA) 5 MG disintegrating tablet Take 1 tablet (5 mg total) by mouth at bedtime. 04/04/20  Yes Penumalli, Earlean Polka, MD  olmesartan (BENICAR) 40 MG tablet Take 1 tablet (40 mg total) by mouth daily. 03/22/20  Yes Lesleigh Noe, MD  pantoprazole (PROTONIX) 40 MG tablet Take 1 tablet (40 mg total) by mouth 2 (two) times daily before a meal. 04/02/20  Yes Milus Banister, MD  polyethylene glycol [facility] / GLYCOLAX) packet Take 17 g by mouth daily.    Yes [provider]  donepezil (ARICEPT) 5 MG tablet Take 1 tablet (5 mg total) by mouth at bedtime. Patient not taking: Reported on 04/07/2020 04/02/20   Lesleigh Noe, MD    Allergies    Patient has no known  allergies.  Review of Systems   Review of Systems  Constitutional: Negative for chills and fever.  Eyes: Negative for visual disturbance.  Respiratory: Negative for shortness of breath.   Cardiovascular: Negative for chest pain.  Gastrointestinal: Positive for abdominal pain. Negative for nausea and vomiting.  Genitourinary: Negative for dysuria.  Musculoskeletal: Negative for back pain and neck pain.  Skin: Negative for color change and rash.  Neurological: Positive for syncope. Negative for dizziness, light-headedness and headaches.  Psychiatric/Behavioral: Positive for sleep disturbance. Negative for hallucinations and suicidal ideas.    Physical Exam Updated Vital Signs BP (!) 184/80 (BP Location: Left Arm)   Pulse 70   Temp (!) 97.5 F (36.4 C) (Oral)   Resp (!) 21   SpO2 100%   Physical Exam Constitutional:      General: She is not in acute distress.    Appearance: She is not ill-appearing, toxic-appearing or diaphoretic.  HENT:     Head:  Normocephalic and atraumatic.  Eyes:     Pupils: Pupils are equal, round, and reactive to light.  Cardiovascular:     Rate and Rhythm: Normal rate.  Abdominal:     General: Abdomen is flat. A surgical scar is present.     Palpations: Abdomen is soft.     Tenderness: There is abdominal tenderness in the left lower quadrant.  Musculoskeletal:     Cervical back: Neck supple. No rigidity or tenderness.  Skin:    General: Skin is warm and dry.  Neurological:     General: No focal deficit present.     Mental Status: She is alert and oriented to person, place, and time.     Cranial Nerves: No facial asymmetry.     Motor: No weakness, tremor or seizure activity.     ED Results / Procedures / Treatments   Labs (all labs ordered are listed, but only abnormal results are displayed) Labs Reviewed  CBC WITH DIFFERENTIAL/PLATELET - Abnormal; Notable for the following components:      Result Value   WBC 2.7 (*)    Hemoglobin 11.7 (*)     All other components within normal limits  BASIC METABOLIC PANEL - Abnormal; Notable for the following components:   Sodium 146 (*)    Potassium 2.8 (*)    Chloride 121 (*)    CO2 20 (*)    Calcium 5.7 (*)    All other components within normal limits  HEPATIC FUNCTION PANEL - Abnormal; Notable for the following components:   Total Protein 3.9 (*)    Albumin 2.3 (*)    Alkaline Phosphatase 31 (*)    Indirect Bilirubin 0.2 (*)    All other components within normal limits  BASIC METABOLIC PANEL - Abnormal; Notable for the following components:   Potassium 6.0 (*)    CO2 20 (*)    GFR, Estimated 59 (*)    All other components within normal limits  I-STAT CHEM 8, ED - Abnormal; Notable for the following components:   Sodium 147 (*)    Potassium 2.2 (*)    Chloride 114 (*)    Creatinine, Ser 0.40 (*)    Glucose, Bld 57 (*)    Calcium, Ion 0.83 (*)    TCO2 18 (*)    Hemoglobin 6.5 (*)    HCT 19.0 (*)    All other components within normal limits  RESP PANEL BY RT-PCR (FLU A&B, COVID) ARPGX2  MAGNESIUM  HEMOGLOBIN AND HEMATOCRIT, BLOOD  GLUCOSE, CAPILLARY  URINALYSIS, ROUTINE W REFLEX MICROSCOPIC  VITAMIN B12  TSH  FOLATE  AMMONIA  COMPREHENSIVE METABOLIC PANEL  CBC  MAGNESIUM  POTASSIUM  CBG MONITORING, ED  CBG MONITORING, ED    EKG None  Radiology CT Head Wo Contrast  Result Date: 04/07/2020 CLINICAL DATA:  Altered mental status. Lethargic and decreased responsiveness. EXAM: CT HEAD WITHOUT CONTRAST TECHNIQUE: Contiguous axial images were obtained from the base of the skull through the vertex without intravenous contrast. COMPARISON:  03/23/2020 FINDINGS: Brain: No evidence of acute infarction, hemorrhage, hydrocephalus, extra-axial collection or mass lesion/mass effect. Age appropriate mild ventricular sulcal enlargement. Mild periventricular white matter hypoattenuation consistent with chronic microvascular ischemic change. Vascular: No hyperdense vessel or  unexpected calcification. Skull: Normal. Negative for fracture or focal lesion. Sinuses/Orbits: Globes and orbits are unremarkable. Visualized sinuses are clear. Other: None. IMPRESSION: 1. No acute intracranial abnormalities. 2. Age-appropriate volume loss and mild chronic microvascular ischemic change. Electronically Signed   By: Lajean Manes  M.D.   On: 04/07/2020 13:51   CT Abdomen Pelvis W Contrast  Result Date: 04/07/2020 CLINICAL DATA:  Left lower quadrant abdominal pain, lethargy EXAM: CT ABDOMEN AND PELVIS WITH CONTRAST TECHNIQUE: Multidetector CT imaging of the abdomen and pelvis was performed using the standard protocol following bolus administration of intravenous contrast. CONTRAST:  134mL OMNIPAQUE IOHEXOL 300 MG/ML  SOLN COMPARISON:  03/23/2020 FINDINGS: Lower chest: No acute pleural or parenchymal lung disease. Dense calcification of the mitral annulus. Hepatobiliary: No focal liver abnormality is seen. Status post cholecystectomy. No biliary dilatation. Pancreas: Unremarkable. No pancreatic ductal dilatation or surrounding inflammatory changes. Spleen: Normal in size without focal abnormality. Adrenals/Urinary Tract: Stable left renal cyst. Otherwise the kidneys enhance normally and symmetrically. There is stable mild bilateral renal cortical atrophy. Adrenals are unremarkable. Bladder is moderately distended without filling defect. Stomach/Bowel: No bowel obstruction or ileus. No bowel wall thickening or inflammatory change. Normal appendix right lower quadrant. Vascular/Lymphatic: Aortic atherosclerosis. No enlarged abdominal or pelvic lymph nodes. Reproductive: Uterus and bilateral adnexa are unremarkable. Other: No free fluid or free gas.  No abdominal wall hernia. Musculoskeletal: No acute or destructive bony lesions. Reconstructed images demonstrate no additional findings. IMPRESSION: 1. No acute intra-abdominal or intrapelvic process. 2.  Aortic Atherosclerosis (ICD10-I70.0).  Electronically Signed   By: Randa Ngo M.D.   On: 04/07/2020 16:01   DG Chest Portable 1 View  Result Date: 04/07/2020 CLINICAL DATA:  Possible infection.  Lethargy. EXAM: PORTABLE CHEST 1 VIEW COMPARISON:  March 26, 2020 FINDINGS: No pneumothorax. The heart, hila, mediastinum, lungs, and pleura are otherwise unchanged and unremarkable. No pneumonia. The heart size remains mildly enlarged. IMPRESSION: No active disease. Electronically Signed   By: Dorise Bullion III M.D   On: 04/07/2020 17:52    Procedures Procedures (including critical care time)  Medications Ordered in ED Medications  0.9 %  sodium chloride infusion (1,000 mLs Intravenous New Bag/Given 04/07/20 1631)  dextrose 5 % 1,000 mL with potassium chloride 40 mEq infusion (has no administration in time range)  colesevelam [facility]) tablet 625 mg (has no administration in time range)  nitroGLYCERIN (NITROSTAT) SL tablet 0.4 mg (has no administration in time range)  famotidine (PEPCID) tablet 20 mg (20 mg Oral Given 04/07/20 2124)  hyoscyamine (LEVSIN SL) SL tablet 0.125 mg (has no administration in time range)  linaclotide (LINZESS) capsule 290 mcg (has no administration in time range)  pantoprazole (PROTONIX) EC tablet 40 mg (has no administration in time range)  polyethylene glycol (MIRALAX / GLYCOLAX) packet 17 g (has no administration in time range)  acetaminophen (TYLENOL) tablet 650 mg (has no administration in time range)    Or  acetaminophen (TYLENOL) suppository 650 mg (has no administration in time range)  ondansetron (ZOFRAN) tablet 4 mg (has no administration in time range)    Or  ondansetron (ZOFRAN) injection 4 mg (has no administration in time range)  metoprolol tartrate (LOPRESSOR) injection 5 mg (has no administration in time range)  haloperidol lactate (HALDOL) injection 1-2 mg (has no administration in time range)  potassium chloride 10 mEq in 100 mL IVPB (0 mEq Intravenous Stopped 04/07/20 2124)  iohexol  (OMNIPAQUE) 300 MG/ML solution 100 mL (100 mLs Intravenous Contrast Given 04/07/20 1541)  potassium chloride 10 mEq in 100 mL IVPB (10 mEq Intravenous New Bag/Given 04/07/20 1829)  calcium gluconate 1 g/ 50 mL sodium chloride IVPB (0 g Intravenous Stopped 04/07/20 1827)    ED Course  I have reviewed the triage vital signs and the nursing  notes.  Pertinent labs & imaging results that were available during my care of the patient were reviewed by me and considered in my medical decision making (see chart for details).    MDM Rules/Calculators/A&P                          Alert and oriented 86 year old female in no acute distress with history of dementia, diverticulosis hypertension, GERD, hallucinations and paranoia.  Patient was brought to the emergency department via EMS.   Vital signs with EMS were unremarkable.  Patient's daughter Ann Riddle was contacted via phone, she reports that she called 911 this morning because the patient was hard to arouse which is abnormal for her.  Also reports that patient had a syncopal episode yesterday while transferring positions.  Daughter was unable to say how long the patient was unconscious for but reports after regaining consciousness at her mental baseline, had no other syncopal episodes or complaints.  Per daughter patient's baseline mental status alert to person, place, and time intermittently.  She reports that patient did not have any of her medications this morning, only needed Ativan as needed for anxiety and is not currently taking her Zyprexa.    When speaking with patient she is alert to person place, and time.  She denies any suicidal or homicidal ideations or hallucinations.  Patient reports that she could not sleep last night because she was worried about being placed in a nursing home.  Patient does not remember being woken this morning.  Patient does complain of lower left quadrant abdominal pain.  Patient denies any fevers, chills, chest  pain, shortness of breath, dysuria, visual disturbance, nausea, vomiting, lightheadedness or dizziness.  On physical exam patient has no focal neurological deficits, abdomen is nondistended, soft, LLQ abdominal tenderness.    Due to patients syncopal episode and acute change in mental status this morning concern for medication overdose, metabolic derangement, infectious etiology, vascular cause such as stroke, or arrhythmia.    EKG was obtained at 15:16, read by attending at 15:18.   15:20 patient had hypocalcemia at 5.7 and hypokalemia at 2.8   patient was given potassium chloride and calcium gluconate.      i-STAT Chem-8 was ordered which also hypothermia and hypocalcemia, it also showed hypoglycemia and a decrease in hemoglobin.  Repeat CBG was 93, repeat H&H were ordered.    CT scan of head showed no acute intracranial abnormalities and age-appropriate volume loss and mild chronic microvascular ischemic change.  CT of abdomen and pelvis showed No acute intra-abdominal or intrapelvic process.  Consult to hospitalist was placed for admission due to patients metabolic derangement.  They agreed to admit the patient.      This case and patient care were discussed with attending Dr. Vanita Panda.          Final Clinical Impression(s) / ED Diagnoses Final diagnoses:  Syncope, unspecified syncope type  Hypokalemia  Hypocalcemia    Rx / DC Orders ED Discharge Orders    None       Loni Beckwith, PA-C 04/07/20 2322    Loni Beckwith, PA-C 04/07/20 2325    Carmin Muskrat, MD 04/08/20 1332

## 2020-04-07 NOTE — H&P (Signed)
History and Physical    Ann Riddle [id number] DOB: 1922/02/03 DOA: 04/07/2020  PCP: Lesleigh Noe, MD   Patient coming from: Home  I have personally briefly reviewed patient's old medical records in [place]  Chief Complaint: Altered mental status  HPI: Ann Riddle is a 86 y.o. female with medical history significant of hypertension, hyperlipidemia, GERD, chronic abdominal pain and constipation, dementia with behavioral disturbance who was recently seen by neurology on 04/04/2020 and started on Zyprexa for worsening hallucinations presented with altered mental status.  Patient is a poor historian because of dementia and elaborately of history is poor.  I have reviewed ER providers documentation and also spoken to patient's daughter on phone.  Daughter states that she was a little more sleepy yesterday evening and was not acting herself and had passed out while transferring positions without any fall or trauma to her head with afterwards patient had eaten her dinner and went to sleep.  This morning, her daughter could not wake her up.  No recent fever, nausea, vomiting, diarrhea, worsening abdominal pain, seizure-like activity.  Patient does not know why she is in the hospital and does not remember how she was brought to the hospital.  ED Course: She was found to have calcium of 5.7 and potassium of 2.8 along with hypoglycemia.  Replacements ordered in the ED.  CT of the brain was negative for acute intracranial abnormality.  Hospitalist service was called to evaluate the patient.  Review of Systems: Could not be obtained appropriately because of patient's dementia and patient being a poor historian.  Past Medical History:  Diagnosis Date  . Dementia ([place])   . Diaphragmatic hernia without mention of obstruction or gangrene   . Diverticulosis of colon (without mention of hemorrhage)   . Esophageal reflux   . Irritable bowel syndrome   . Other and unspecified hyperlipidemia    . Unspecified essential hypertension   . Ventral hernia, unspecified, without mention of obstruction or gangrene     Past Surgical History:  Procedure Laterality Date  . CHOLECYSTECTOMY    . CORONARY ANGIOGRAPHY N/A 06/01/2018   Procedure: CORONARY ANGIOGRAPHY;  Surgeon: Sherren Mocha, MD;  Location: [facility];  Service: Cardiovascular;  Laterality: N/A;  . HIATAL HERNIA REPAIR       reports that she has never smoked. She has never used smokeless tobacco. She reports that she does not drink alcohol and does not use drugs.  No Known Allergies  Family History  Problem Relation Age of Onset  . Breast cancer Daughter 62  . Colon cancer Neg Hx     Prior to Admission medications   Medication Sig Start Date End Date Taking? Authorizing Provider  Acetaminophen (TYLENOL PO) Take 1 tablet by mouth daily as needed (pain).     [provider]  aspirin EC 81 MG tablet Take 81 mg by mouth daily.    [provider]  clonazePAM (KLONOPIN) 0.5 MG tablet Take 1 tablet (0.5 mg total) by mouth at bedtime as needed for anxiety (sleep). Patient taking differently: Take 0.5 mg by mouth at bedtime as needed for anxiety.  02/20/20   Lesleigh Noe, MD  colesevelam (WELCHOL) 625 MG tablet Take 1 tablet (625 mg total) by mouth daily. 02/16/20   Milus Banister, MD  donepezil (ARICEPT) 5 MG tablet Take 1 tablet (5 mg total) by mouth at bedtime. 04/02/20   Lesleigh Noe, MD  ENSURE (ENSURE) Take 237 mLs by mouth  daily.    [provider]  famotidine (PEPCID) 20 MG tablet Take 1 tablet (20 mg total) by mouth at bedtime. 03/20/20   Milus Banister, MD  hyoscyamine (LEVSIN SL) 0.125 MG SL tablet Place 1 tablet (0.125 mg total) under the tongue every 8 (eight) hours as needed. Patient taking differently: Place 0.125 mg under the tongue every 8 (eight) hours as needed for cramping.  06/01/19   Milus Banister, MD  LINZESS 290 MCG CAPS capsule Take 290 mcg by mouth daily.  10/26/19   [provider]  Multiple Vitamin (MULTIVITAMIN WITH MINERALS) TABS tablet Take 1 tablet by mouth daily.    [provider]  nitroGLYCERIN (NITROSTAT) 0.4 MG SL tablet Place 1 tablet (0.4 mg total) under the tongue every 5 (five) minutes as needed for chest pain. 11/14/15   Thurnell Lose, MD  OLANZapine zydis (ZYPREXA) 5 MG disintegrating tablet Take 1 tablet (5 mg total) by mouth at bedtime. 04/04/20   Penumalli, Earlean Polka, MD  olmesartan (BENICAR) 40 MG tablet Take 1 tablet (40 mg total) by mouth daily. 03/22/20   Lesleigh Noe, MD  pantoprazole (PROTONIX) 40 MG tablet Take 1 tablet (40 mg total) by mouth 2 (two) times daily before a meal. 04/02/20   Milus Banister, MD  polyethylene glycol [facility] / Floria Raveling) packet Take 17 g by mouth daily as needed.     [provider]    Physical Exam: Vitals:   04/07/20 1530 04/07/20 1632 04/07/20 1700 04/07/20 1730  BP: (!) 167/73 (!) 187/77 (!) 157/74 (!) 119/101  Pulse: 83 81 75 75  Resp: 16 16 14 15   Temp:      TempSrc:      SpO2: 98% 100% 99% 99%    Constitutional: NAD, calm, comfortable.  Elderly female lying in bed.  Awake.  Very hard of hearing Vitals:   04/07/20 1530 04/07/20 1632 04/07/20 1700 04/07/20 1730  BP: (!) 167/73 (!) 187/77 (!) 157/74 (!) 119/101  Pulse: 83 81 75 75  Resp: 16 16 14 15   Temp:      TempSrc:      SpO2: 98% 100% 99% 99%   Eyes: PERRL, lids and conjunctivae normal ENMT: Mucous membranes are dry. Posterior pharynx clear of any exudate or lesions. Neck: normal, supple, no masses, no thyromegaly Respiratory: bilateral decreased breath sounds at bases, no wheezing, no crackles. Normal respiratory effort. No accessory muscle use.  Cardiovascular: S1 S2 positive, rate controlled. No extremity edema. 2+ pedal pulses.  Abdomen: no tenderness, no masses palpated. No hepatosplenomegaly. Bowel sounds positive.  Musculoskeletal: no clubbing / cyanosis. No joint deformity upper  and lower extremities.  Skin: No obvious petechiae/lesions  neurologic: CN 2-12 grossly intact. Moving extremities. No focal neurologic deficits.  Awake, very hard of hearing, very slow to respond to questions; intermittently confused Psychiatric: Could not be assessed because of patient's mental status   Labs on Admission: I have personally reviewed following labs and imaging studies  CBC: Recent Labs  Lab 04/07/20 1313 04/07/20 1606  WBC 2.7*  --   NEUTROABS 1.7  --   HGB 11.7* 6.5*  HCT 36.4 19.0*  MCV 92.6  --   PLT 179  --    Basic Metabolic Panel: Recent Labs  Lab 04/07/20 1357 04/07/20 1606  NA 146* 147*  K 2.8* 2.2*  CL 121* 114*  CO2 20*  --   GLUCOSE 72 57*  BUN 13 9  CREATININE 0.52 0.40*  CALCIUM  5.7*  --    GFR: Estimated Creatinine Clearance: 32.5 mL/min (A) (by C-G formula based on SCr of 0.4 mg/dL (L)). Liver Function Tests: Recent Labs  Lab 04/07/20 1522  AST 18  ALT 9  ALKPHOS 31*  BILITOT 0.4  PROT 3.9*  ALBUMIN 2.3*   No results for input(s): LIPASE, AMYLASE in the last 168 hours. No results for input(s): AMMONIA in the last 168 hours. Coagulation Profile: No results for input(s): INR, PROTIME in the last 168 hours. Cardiac Enzymes: No results for input(s): CKTOTAL, CKMB, CKMBINDEX, TROPONINI in the last 168 hours. BNP (last 3 results) No results for input(s): PROBNP in the last 8760 hours. HbA1C: No results for input(s): HGBA1C in the last 72 hours. CBG: Recent Labs  Lab 04/07/20 1257 04/07/20 1728  GLUCAP 92 93   Lipid Profile: No results for input(s): CHOL, HDL, LDLCALC, TRIG, CHOLHDL, LDLDIRECT in the last 72 hours. Thyroid Function Tests: No results for input(s): TSH, T4TOTAL, FREET4, T3FREE, THYROIDAB in the last 72 hours. Anemia Panel: No results for input(s): VITAMINB12, FOLATE, FERRITIN, TIBC, IRON, RETICCTPCT in the last 72 hours. Urine analysis:    Component Value Date/Time   COLORURINE YELLOW 03/26/2020 1700    APPEARANCEUR CLEAR 03/26/2020 1700   LABSPEC 1.013 03/26/2020 1700   PHURINE 7.0 03/26/2020 1700   GLUCOSEU NEGATIVE 03/26/2020 1700   HGBUR NEGATIVE 03/26/2020 1700   BILIRUBINUR NEGATIVE 03/26/2020 1700   KETONESUR NEGATIVE 03/26/2020 1700   PROTEINUR NEGATIVE 03/26/2020 1700   UROBILINOGEN 0.2 02/22/2020 1752   NITRITE NEGATIVE 03/26/2020 1700   LEUKOCYTESUR SMALL (A) 03/26/2020 1700    Radiological Exams on Admission: CT Head Wo Contrast  Result Date: 04/07/2020 CLINICAL DATA:  Altered mental status. Lethargic and decreased responsiveness. EXAM: CT HEAD WITHOUT CONTRAST TECHNIQUE: Contiguous axial images were obtained from the base of the skull through the vertex without intravenous contrast. COMPARISON:  03/23/2020 FINDINGS: Brain: No evidence of acute infarction, hemorrhage, hydrocephalus, extra-axial collection or mass lesion/mass effect. Age appropriate mild ventricular sulcal enlargement. Mild periventricular white matter hypoattenuation consistent with chronic microvascular ischemic change. Vascular: No hyperdense vessel or unexpected calcification. Skull: Normal. Negative for fracture or focal lesion. Sinuses/Orbits: Globes and orbits are unremarkable. Visualized sinuses are clear. Other: None. IMPRESSION: 1. No acute intracranial abnormalities. 2. Age-appropriate volume loss and mild chronic microvascular ischemic change. Electronically Signed   By: Lajean Manes M.D.   On: 04/07/2020 13:51   CT Abdomen Pelvis W Contrast  Result Date: 04/07/2020 CLINICAL DATA:  Left lower quadrant abdominal pain, lethargy EXAM: CT ABDOMEN AND PELVIS WITH CONTRAST TECHNIQUE: Multidetector CT imaging of the abdomen and pelvis was performed using the standard protocol following bolus administration of intravenous contrast. CONTRAST:  158mL OMNIPAQUE IOHEXOL 300 MG/ML  SOLN COMPARISON:  03/23/2020 FINDINGS: Lower chest: No acute pleural or parenchymal lung disease. Dense calcification of the mitral  annulus. Hepatobiliary: No focal liver abnormality is seen. Status post cholecystectomy. No biliary dilatation. Pancreas: Unremarkable. No pancreatic ductal dilatation or surrounding inflammatory changes. Spleen: Normal in size without focal abnormality. Adrenals/Urinary Tract: Stable left renal cyst. Otherwise the kidneys enhance normally and symmetrically. There is stable mild bilateral renal cortical atrophy. Adrenals are unremarkable. Bladder is moderately distended without filling defect. Stomach/Bowel: No bowel obstruction or ileus. No bowel wall thickening or inflammatory change. Normal appendix right lower quadrant. Vascular/Lymphatic: Aortic atherosclerosis. No enlarged abdominal or pelvic lymph nodes. Reproductive: Uterus and bilateral adnexa are unremarkable. Other: No free fluid or free gas.  No abdominal wall hernia. Musculoskeletal:  No acute or destructive bony lesions. Reconstructed images demonstrate no additional findings. IMPRESSION: 1. No acute intra-abdominal or intrapelvic process. 2.  Aortic Atherosclerosis (ICD10-I70.0). Electronically Signed   By: Randa Ngo M.D.   On: 04/07/2020 16:01    EKG: Has not been done yet.  Chest x-ray also has not been done yet. UA is pending. COVID-19 testing is pending  Assessment/Plan  Severe metabolic derangements including severe hypokalemia, hypocalcemia, hypoglycemia, hypernatremia Possible dehydration -Unclear cause.  Daughter states that patient was eating and drinking okay till yesterday but also states that patient drinks a lot of water. -Presented with potassium of 2.8 and calcium of 5.7.  Replacements have been ordered in the ED.  Repeat potassium is 2.2.  We will continue aggressive replacement of potassium and calcium and obtain magnesium level as well.  Repeat potassium, magnesium and calcium levels this evening. -Blood glucose was 57 at one point.  Will start D5 with supplemental potassium at 100 cc an hour -Check EKG.  Check UA  and chest x-ray to rule out infection.  Acute metabolic encephalopathy -Probably secondary to above.  CT of the brain was negative for acute intracranial abnormality.  Mental status currently improving.  If mental status remains an issue despite electrolyte improvement, might have to do MRI of the brain -Neurochecks.  Fall precautions.  PT/SLP evaluation. -Vitamin Y70, TSH, folic acid, ammonia levels in a.m. -Hold sedative medications for now including recently starting Zyprexa  Anemia -Questionable cause.  Hemoglobin 11.7 on presentation.  Repeat hemoglobin was 6.5,?  Error.  No obvious signs of bleeding.  Will check repeat hemoglobin.  Dementia with behavioral disturbances -Fall precautions.  Hold sedative medications for now.  Use Haldol as needed for agitation  Hypertension -Monitor blood pressure.  Hyperlipidemia -Continue WelChol  Chronic constipation -Continue home regimen  Generalized conditioning -PT eval.  Overall prognosis is guarded to poor.  Palliative care consultation for goals of care discussion   DVT prophylaxis: Lovenox Code Status: DNR.  Confirmed by the daughter Family Communication: Daughter on phone Disposition Plan: Home in 1 to 2 days if clinically improves Consults called: Palliative care Admission status: Observation/telemetry  Severity of Illness: The appropriate patient status for this patient is OBSERVATION. Observation status is judged to be reasonable and necessary in order to provide the required intensity of service to ensure the patient's safety. The patient's presenting symptoms, physical exam findings, and initial radiographic and laboratory data in the context of their medical condition is felt to place them at decreased risk for further clinical deterioration. Furthermore, it is anticipated that the patient will be medically stable for discharge from the hospital within 2 midnights of admission. The following factors support the patient status  of observation.   " The patient's presenting symptoms include altered mental status. " The physical exam findings include intermittent confusion. " The initial radiographic and laboratory data are severe hypokalemia/hypocalcemia/hypoglycemia.     Aline August MD [facility]  04/07/2020, 5:38 PM

## 2020-04-08 DIAGNOSIS — I10 Essential (primary) hypertension: Secondary | ICD-10-CM | POA: Diagnosis not present

## 2020-04-08 DIAGNOSIS — E876 Hypokalemia: Secondary | ICD-10-CM | POA: Diagnosis not present

## 2020-04-08 DIAGNOSIS — G9341 Metabolic encephalopathy: Secondary | ICD-10-CM | POA: Diagnosis not present

## 2020-04-08 DIAGNOSIS — F0391 Unspecified dementia with behavioral disturbance: Secondary | ICD-10-CM | POA: Diagnosis not present

## 2020-04-08 LAB — COMPREHENSIVE METABOLIC PANEL
ALT: 17 U/L (ref 0–44)
AST: 23 U/L (ref 15–41)
Albumin: 3.8 g/dL (ref 3.5–5.0)
Alkaline Phosphatase: 60 U/L (ref 38–126)
Anion gap: 10 (ref 5–15)
BUN: 17 mg/dL (ref 8–23)
CO2: 26 mmol/L (ref 22–32)
Calcium: 9.1 mg/dL (ref 8.9–10.3)
Chloride: 104 mmol/L (ref 98–111)
Creatinine, Ser: 0.82 mg/dL (ref 0.44–1.00)
GFR, Estimated: >60 mL/min (ref >60)
Glucose, Bld: 130 mg/dL — ABNORMAL HIGH (ref 70–99)
Potassium: 4 mmol/L (ref 3.5–5.1)
Sodium: 140 mmol/L (ref 135–145)
Total Bilirubin: 0.6 mg/dL (ref 0.3–1.2)
Total Protein: 6.4 g/dL — ABNORMAL LOW (ref 6.5–8.1)

## 2020-04-08 LAB — MAGNESIUM: Magnesium: 2.3 mg/dL (ref 1.7–2.4)

## 2020-04-08 LAB — URINALYSIS, ROUTINE W REFLEX MICROSCOPIC
Bacteria, UA: NONE SEEN
Bilirubin Urine: NEGATIVE
Glucose, UA: NEGATIVE mg/dL
Hgb urine dipstick: NEGATIVE
Ketones, ur: NEGATIVE mg/dL
Nitrite: NEGATIVE
Protein, ur: NEGATIVE mg/dL
Specific Gravity, Urine: 1.016 (ref 1.005–1.030)
pH: 8 (ref 5.0–8.0)

## 2020-04-08 LAB — CBC
HCT: 41.2 % (ref 36.0–46.0)
Hemoglobin: 13.5 g/dL (ref 12.0–15.0)
MCH: 29.9 pg (ref 26.0–34.0)
MCHC: 32.8 g/dL (ref 30.0–36.0)
MCV: 91.4 fL (ref 80.0–100.0)
Platelets: 203 10*3/uL (ref 150–400)
RBC: 4.51 MIL/uL (ref 3.87–5.11)
RDW: 13 % (ref 11.5–15.5)
WBC: 3.1 10*3/uL — ABNORMAL LOW (ref 4.0–10.5)
nRBC: 0 % (ref 0.0–0.2)

## 2020-04-08 LAB — AMMONIA: Ammonia: 32 umol/L (ref 9–35)

## 2020-04-08 LAB — VITAMIN B12: Vitamin B-12: 648 pg/mL (ref 180–914)

## 2020-04-08 LAB — TSH: TSH: 2.079 u[IU]/mL (ref 0.350–4.500)

## 2020-04-08 LAB — FOLATE: Folate: >24.8 ng/mL (ref >5.9)

## 2020-04-08 MED ORDER — CLONAZEPAM 0.5 MG PO TABS: 0.5000 mg | ORAL_TABLET | Freq: Every evening | ORAL | Status: DC | PRN

## 2020-04-08 MED ORDER — HYOSCYAMINE SULFATE 0.125 MG SL SUBL: 0.1250 mg | SUBLINGUAL_TABLET | Freq: Three times a day (TID) | SUBLINGUAL | Status: DC | PRN

## 2020-04-08 MED ORDER — OLANZAPINE 5 MG PO TBDP: 5.0000 mg | ORAL_TABLET | Freq: Every day | ORAL | Status: DC

## 2020-04-08 NOTE — Progress Notes (Signed)
PT Cancellation Note  Patient Details Name: Ann Riddle MRN: [id number] DOB: [birth year]   Cancelled Treatment:    Reason Eval/Treat Not Completed: Other (comment). Nurse reports pt agitated. Was swatting at nurse and was also running in the hall. Pt scheduled to be d/c home today with family. Will check back as schedule permits to see if pt appropriate for PT services.   Galen Manila 04/08/2020, 10:45 AM

## 2020-04-08 NOTE — Discharge Summary (Signed)
Physician Discharge Summary  Ann Riddle [id number] DOB: 1921/06/30 DOA: 04/07/2020  PCP: Lesleigh Noe, MD  Admit date: 04/07/2020 Discharge date: 04/08/2020  Admitted From: Home Disposition: Home  Recommendations for Outpatient Follow-up:  1. Follow up with PCP in 1 week with repeat CBC/BMP 2. Outpatient follow-up with neurology 3. Recommend outpatient evaluation and follow-up by palliative care if condition were to worsen 4. Follow up in ED if symptoms worsen or new appear   Home Health: No Equipment/Devices: None  Discharge Condition: Guarded CODE STATUS: DNR  diet recommendation: Heart healthy  Brief/Interim Summary: 86 y.o. female with medical history significant of hypertension, hyperlipidemia, GERD, chronic abdominal pain and constipation, dementia with behavioral disturbance who was recently seen by neurology on 04/04/2020 and started on Zyprexa for worsening hallucinations presented with altered mental status.  On presentation, she was found to have calcium of 5.7 and potassium of 2.8 along with hypoglycemia.  CT of the brain was negative for acute intracranial abnormality.  She was treated with aggressive potassium and calcium replacement and also started on IV dextrose.  During the hospitalization, her electrolyte abnormalities have normalized.  She is  probably back to her baseline mental status.  Daughter is agreeable for the patient to be discharged home today.  Patient will benefit from outpatient neurology eval.  Discharge the patient home today.  Discharge Diagnoses:   Severe metabolic derangements including severe hypokalemia, hypocalcemia, hypoglycemia, hypernatremia Possible dehydration -Unclear cause.  Daughter stated that patient was eating and drinking okay till the day prior to presentation but also stated that patient drinks a lot of water. -Presented with potassium of 2.8 and calcium of 5.7.  Replacements have been ordered in the ED.  Repeat potassium is  2.2.   -Patient was treated with aggressive potassium and calcium replacement along with D5 drip.   -Chest x-ray was negative for acute abnormality.  UA had bacteriuria but without fever, leukocytosis: I would not treat her with antibiotics -Her metabolic derangements have normalized with potassium of 4 and calcium of 9.1 this morning.  Blood sugars improving. - will discharge home today with outpatient follow-up of labs by PCP.  Acute metabolic encephalopathy -Probably secondary to above.  CT of the brain was negative for acute intracranial abnormality.   -Mental status probably back to her baseline -Vitamin U98, TSH, folic acid, ammonia levels are normal -Patient was recently started on Zyprexa by neurology.  Consider neurology follow-up for the same.  Anemia -Questionable cause.  Hemoglobin 11.7 on presentation.  Repeat hemoglobin was 6.5.  Was possibly an error.  Hemoglobin this morning is 13.5.    Dementia with behavioral disturbances -Patient is probably back to her baseline with intermittent hallucinations.  This is the reason she was recently started on Zyprexa by neurology.  Will resume Zyprexa and as needed Klonopin at night.  Spoke to the daughter and recommend following with neurology regarding adjustment of dose of Zyprexa versus starting any new other medication.    Hypertension -Resume home regimen on discharge.  Hyperlipidemia -Continue WelChol  Chronic constipation -Continue home regimen  Generalized conditioning -If condition does not improve, consider palliative care evaluation as an outpatient.   Discharge Instructions  Discharge Instructions    Ambulatory referral to Neurology   Complete by: As directed    An appointment is requested in approximately: In 1 to 2 weeks for hospital follow-up.  Patient admitted for acute metabolic encephalopathy after recently being started on Zyprexa   Diet - low sodium heart healthy  Complete by: As directed     Increase activity slowly   Complete by: As directed      Allergies as of 04/08/2020   No Known Allergies     Medication List    STOP taking these medications   donepezil 5 MG tablet Commonly known as: ARICEPT     TAKE these medications   acetaminophen 650 MG CR tablet Commonly known as: TYLENOL Take 650 mg by mouth daily as needed (headache).   aspirin EC 81 MG tablet Take 81 mg by mouth daily.   clonazePAM 0.5 MG tablet Commonly known as: KLONOPIN Take 1 tablet (0.5 mg total) by mouth at bedtime as needed for anxiety (sleep). What changed: reasons to take this   colesevelam 625 MG tablet Commonly known as: WELCHOL Take 1 tablet (625 mg total) by mouth daily.   Ensure Take 237 mLs by mouth See admin instructions. Drink one can (237 mls) by mouth once or twice daily   famotidine 20 MG tablet Commonly known as: PEPCID Take 1 tablet (20 mg total) by mouth at bedtime.   hyoscyamine 0.125 MG SL tablet Commonly known as: LEVSIN SL Place 1 tablet (0.125 mg total) under the tongue every 8 (eight) hours as needed (chest pains/acid reflux).   Linzess 290 MCG Caps capsule Generic drug: linaclotide Take 290 mcg by mouth daily.   multivitamin with minerals Tabs tablet Take 1 tablet by mouth daily.   nitroGLYCERIN 0.4 MG SL tablet Commonly known as: NITROSTAT Place 1 tablet (0.4 mg total) under the tongue every 5 (five) minutes as needed for chest pain.   OLANZapine zydis 5 MG disintegrating tablet Commonly known as: ZYPREXA Take 1 tablet (5 mg total) by mouth at bedtime.   olmesartan 40 MG tablet Commonly known as: BENICAR Take 1 tablet (40 mg total) by mouth daily.   pantoprazole 40 MG tablet Commonly known as: PROTONIX Take 1 tablet (40 mg total) by mouth 2 (two) times daily before a meal.   polyethylene glycol 17 g packet Commonly known as: MIRALAX / GLYCOLAX Take 17 g by mouth daily.       No Known  Allergies  Consultations:  None   Procedures/Studies: CT ABDOMEN PELVIS WO CONTRAST  Result Date: 03/23/2020 CLINICAL DATA:  Altered mental status, abdominal pain EXAM: CT ABDOMEN AND PELVIS WITHOUT CONTRAST TECHNIQUE: Multidetector CT imaging of the abdomen and pelvis was performed following the standard protocol without IV contrast. COMPARISON:  11/24/2019 FINDINGS: Lower chest: Cardiomegaly. Densely calcified mitral valve annulus. Coronary artery calcifications. No acute abnormality. Hepatobiliary: Prior coli/that No focal liver abnormality is seen. Status post cholecystectomy. No biliary dilatation. Pancreas: No focal abnormality or ductal dilatation. Spleen: No focal abnormality.  Normal size. Adrenals/Urinary Tract: 2.8 cm cyst off the lower pole of the left kidney. No hydronephrosis. No stones. Adrenal glands and urinary bladder unremarkable. Stomach/Bowel: Moderate stool burden throughout the colon. No bowel obstruction. Normal appendix. Stomach and small bowel decompressed, unremarkable. Vascular/Lymphatic: Aortic atherosclerosis. No evidence of aneurysm or adenopathy. Reproductive: Uterus and adnexa unremarkable.  No mass. Other: No free fluid or free air. Musculoskeletal: No acute bony abnormality. IMPRESSION: Moderate stool burden throughout the colon. Aortic atherosclerosis. Cardiomegaly, coronary artery disease. No acute findings. Electronically Signed   By: Rolm Baptise M.D.   On: 03/23/2020 19:50   DG Chest 2 View  Result Date: 03/26/2020 CLINICAL DATA:  Medical clearance - nonsmoker EXAM: CHEST - 2 VIEW COMPARISON:  Chest x-ray 12/21/2018, chest x-ray 02/05/2010 FINDINGS: The heart size and  mediastinal contours are within normal limits. Redemonstration of marked mitral annular calcification. Aortic arch calcification. No focal consolidation. No pulmonary edema. No pleural effusion. No pneumothorax. No acute osseous abnormality. Bilateral shoulder degenerative changes. Upper abdomen  surgical clips. IMPRESSION: No active cardiopulmonary disease. Electronically Signed   By: Iven Finn M.D.   On: 03/26/2020 18:07   CT Head Wo Contrast  Result Date: 04/07/2020 CLINICAL DATA:  Altered mental status. Lethargic and decreased responsiveness. EXAM: CT HEAD WITHOUT CONTRAST TECHNIQUE: Contiguous axial images were obtained from the base of the skull through the vertex without intravenous contrast. COMPARISON:  03/23/2020 FINDINGS: Brain: No evidence of acute infarction, hemorrhage, hydrocephalus, extra-axial collection or mass lesion/mass effect. Age appropriate mild ventricular sulcal enlargement. Mild periventricular white matter hypoattenuation consistent with chronic microvascular ischemic change. Vascular: No hyperdense vessel or unexpected calcification. Skull: Normal. Negative for fracture or focal lesion. Sinuses/Orbits: Globes and orbits are unremarkable. Visualized sinuses are clear. Other: None. IMPRESSION: 1. No acute intracranial abnormalities. 2. Age-appropriate volume loss and mild chronic microvascular ischemic change. Electronically Signed   By: Lajean Manes M.D.   On: 04/07/2020 13:51   CT Head Wo Contrast  Result Date: 03/23/2020 CLINICAL DATA:  Altered mental status, abdominal pain EXAM: CT HEAD WITHOUT CONTRAST TECHNIQUE: Contiguous axial images were obtained from the base of the skull through the vertex without intravenous contrast. COMPARISON:  CT 12/25/2014 FINDINGS: Brain: No evidence of acute infarction, hemorrhage, hydrocephalus, extra-axial collection, visible mass lesion or mass effect. Symmetric prominence of the ventricles, cisterns and sulci compatible with parenchymal volume loss. Patchy areas of white matter hypoattenuation are most compatible with chronic microvascular angiopathy. Stable benign dural calcifications and tiny parafalcine dural lipoma. Basal cisterns are patent. Expanded CSF filled partially empty appearance of the sella, similar to  comparisons. Vascular: Atherosclerotic calcification of the carotid siphons and intradural vertebral arteries. No hyperdense vessel. Skull: No calvarial fracture or suspicious osseous lesion. No scalp swelling or hematoma. Paranasal sinuses and mastoid air cells are predominantly clear. Sinuses/Orbits: Paranasal sinuses and mastoid air cells are predominantly clear. Orbital structures are unremarkable aside from prior lens extractions. Other: None. IMPRESSION: 1. No acute intracranial findings. 2. Chronic microvascular angiopathy and parenchymal volume loss. Electronically Signed   By: Lovena Le M.D.   On: 03/23/2020 19:49   CT Abdomen Pelvis W Contrast  Result Date: 04/07/2020 CLINICAL DATA:  Left lower quadrant abdominal pain, lethargy EXAM: CT ABDOMEN AND PELVIS WITH CONTRAST TECHNIQUE: Multidetector CT imaging of the abdomen and pelvis was performed using the standard protocol following bolus administration of intravenous contrast. CONTRAST:  166mL OMNIPAQUE IOHEXOL 300 MG/ML  SOLN COMPARISON:  03/23/2020 FINDINGS: Lower chest: No acute pleural or parenchymal lung disease. Dense calcification of the mitral annulus. Hepatobiliary: No focal liver abnormality is seen. Status post cholecystectomy. No biliary dilatation. Pancreas: Unremarkable. No pancreatic ductal dilatation or surrounding inflammatory changes. Spleen: Normal in size without focal abnormality. Adrenals/Urinary Tract: Stable left renal cyst. Otherwise the kidneys enhance normally and symmetrically. There is stable mild bilateral renal cortical atrophy. Adrenals are unremarkable. Bladder is moderately distended without filling defect. Stomach/Bowel: No bowel obstruction or ileus. No bowel wall thickening or inflammatory change. Normal appendix right lower quadrant. Vascular/Lymphatic: Aortic atherosclerosis. No enlarged abdominal or pelvic lymph nodes. Reproductive: Uterus and bilateral adnexa are unremarkable. Other: No free fluid or free gas.   No abdominal wall hernia. Musculoskeletal: No acute or destructive bony lesions. Reconstructed images demonstrate no additional findings. IMPRESSION: 1. No acute intra-abdominal or intrapelvic process. 2.  Aortic Atherosclerosis (ICD10-I70.0). Electronically Signed   By: Randa Ngo M.D.   On: 04/07/2020 16:01   DG Chest Portable 1 View  Result Date: 04/07/2020 CLINICAL DATA:  Possible infection.  Lethargy. EXAM: PORTABLE CHEST 1 VIEW COMPARISON:  March 26, 2020 FINDINGS: No pneumothorax. The heart, hila, mediastinum, lungs, and pleura are otherwise unchanged and unremarkable. No pneumonia. The heart size remains mildly enlarged. IMPRESSION: No active disease. Electronically Signed   By: Dorise Bullion III M.D   On: 04/07/2020 17:52       Subjective: Patient seen and examined at bedside.  She is awake, very hard of hearing.  Nursing staff reports intermittent hallucinations.  No overnight fever, vomiting reported.  Discharge Exam: Vitals:   04/08/20 0416 04/08/20 0815  BP: (!) 159/54 (!) 160/78  Pulse: 68 79  Resp: 17 16  Temp: 97.8 F (36.6 C) 97.6 F (36.4 C)  SpO2: 100% 100%    General: Pt is elderly female lying in bed.  Awake, very poor historian.  Very hard of hearing. Cardiovascular: rate controlled, S1/S2 + Respiratory: bilateral decreased breath sounds at bases Abdominal: Soft, NT, ND, bowel sounds + Extremities: no edema, no cyanosis    The results of significant diagnostics from this hospitalization (including imaging, microbiology, ancillary and laboratory) are listed below for reference.     Microbiology: Recent Results (from the past 240 hour(s))  Resp Panel by RT-PCR (Flu A&B, Covid) Nasopharyngeal Swab     Status: None   Collection Time: 04/07/20  4:51 PM   Specimen: Nasopharyngeal Swab; Nasopharyngeal(NP) swabs in vial transport medium  Result Value Ref Range Status   SARS Coronavirus 2 by RT PCR NEGATIVE NEGATIVE Final    Comment:  (NOTE) SARS-CoV-2 target nucleic acids are NOT DETECTED.  The SARS-CoV-2 RNA is generally detectable in upper respiratory specimens during the acute phase of infection. The lowest concentration of SARS-CoV-2 viral copies this assay can detect is 138 copies/mL. A negative result does not preclude SARS-Cov-2 infection and should not be used as the sole basis for treatment or other patient management decisions. A negative result may occur with  improper specimen collection/handling, submission of specimen other than nasopharyngeal swab, presence of viral mutation(s) within the areas targeted by this assay, and inadequate number of viral copies(<138 copies/mL). A negative result must be combined with clinical observations, patient history, and epidemiological information. The expected result is Negative.  Fact Sheet for Patients:  EntrepreneurPulse.com.au  Fact Sheet for Healthcare Providers:  IncredibleEmployment.be  This test is no t yet approved or cleared by the [place] FDA and  has been authorized for detection and/or diagnosis of SARS-CoV-2 by FDA under an Emergency Use Authorization (EUA). This EUA will remain  in effect (meaning this test can be used) for the duration of the COVID-19 declaration under Section 564(b)(1) of the Act, 21 U.S.C.section 360bbb-3(b)(1), unless the authorization is terminated  or revoked sooner.       Influenza A by PCR NEGATIVE NEGATIVE Final   Influenza B by PCR NEGATIVE NEGATIVE Final    Comment: (NOTE) The Xpert Xpress SARS-CoV-2/FLU/RSV plus assay is intended as an aid in the diagnosis of influenza from Nasopharyngeal swab specimens and should not be used as a sole basis for treatment. Nasal washings and aspirates are unacceptable for Xpert Xpress SARS-CoV-2/FLU/RSV testing.  Fact Sheet for Patients: EntrepreneurPulse.com.au  Fact Sheet for Healthcare  Providers: IncredibleEmployment.be  This test is not yet approved or cleared by the [place] and has been authorized for  detection and/or diagnosis of SARS-CoV-2 by FDA under an Emergency Use Authorization (EUA). This EUA will remain in effect (meaning this test can be used) for the duration of the COVID-19 declaration under Section 564(b)(1) of the Act, 21 U.S.C. section 360bbb-3(b)(1), unless the authorization is terminated or revoked.  Performed at [facility], [place] [address], [place], [place] [address]      Labs: BNP (last 3 results) No results for input(s): BNP in the last 8760 hours. Basic Metabolic Panel: Recent Labs  Lab 04/07/20 1357 04/07/20 1606 04/07/20 2052 04/07/20 2320 04/08/20 0432  NA 146* 147* 138  --  140  K 2.8* 2.2* 6.0* 4.8 4.0  CL 121* 114* 107  --  104  CO2 20*  --  20*  --  26  GLUCOSE 72 57* 93  --  130*  BUN 13 9 14   --  17  CREATININE 0.52 0.40* 0.89  --  0.82  CALCIUM 5.7*  --  9.2  --  9.1  MG  --   --  2.3  --  2.3   Liver Function Tests: Recent Labs  Lab 04/07/20 1522 04/08/20 0432  AST 18 23  ALT 9 17  ALKPHOS 31* 60  BILITOT 0.4 0.6  PROT 3.9* 6.4*  ALBUMIN 2.3* 3.8   No results for input(s): LIPASE, AMYLASE in the last 168 hours. Recent Labs  Lab 04/08/20 0432  AMMONIA 32   CBC: Recent Labs  Lab 04/07/20 1313 04/07/20 1606 04/07/20 2052 04/08/20 0432  WBC 2.7*  --   --  3.1*  NEUTROABS 1.7  --   --   --   HGB 11.7* 6.5* 14.1 13.5  HCT 36.4 19.0* 41.0 41.2  MCV 92.6  --   --  91.4  PLT 179  --   --  203   Cardiac Enzymes: No results for input(s): CKTOTAL, CKMB, CKMBINDEX, TROPONINI in the last 168 hours. BNP: Invalid input(s): POCBNP CBG: Recent Labs  Lab 04/07/20 1257 04/07/20 1728 04/07/20 2023  GLUCAP 92 93 86   D-Dimer No results for input(s): DDIMER in the last 72 hours. Hgb A1c No results for input(s): HGBA1C in the last 72 hours. Lipid  Profile No results for input(s): CHOL, HDL, LDLCALC, TRIG, CHOLHDL, LDLDIRECT in the last 72 hours. Thyroid function studies Recent Labs    04/08/20 0432  TSH 2.079   Anemia work up Recent Labs    04/08/20 0432  VITAMINB12 648  FOLATE >24.8   Urinalysis    Component Value Date/Time   COLORURINE STRAW (A) 04/08/2020 0617   APPEARANCEUR HAZY (A) 04/08/2020 0617   LABSPEC 1.016 04/08/2020 0617   PHURINE 8.0 04/08/2020 0617   GLUCOSEU NEGATIVE 04/08/2020 0617   HGBUR NEGATIVE 04/08/2020 0617   BILIRUBINUR NEGATIVE 04/08/2020 0617   KETONESUR NEGATIVE 04/08/2020 0617   PROTEINUR NEGATIVE 04/08/2020 0617   UROBILINOGEN 0.2 02/22/2020 1752   NITRITE NEGATIVE 04/08/2020 0617   LEUKOCYTESUR MODERATE (A) 04/08/2020 0617   Sepsis Labs Invalid input(s): PROCALCITONIN,  WBC,  LACTICIDVEN Microbiology Recent Results (from the past 240 hour(s))  Resp Panel by RT-PCR (Flu A&B, Covid) Nasopharyngeal Swab     Status: None   Collection Time: 04/07/20  4:51 PM   Specimen: Nasopharyngeal Swab; Nasopharyngeal(NP) swabs in vial transport medium  Result Value Ref Range Status   SARS Coronavirus 2 by RT PCR NEGATIVE NEGATIVE Final    Comment: (NOTE) SARS-CoV-2 target nucleic acids are NOT DETECTED.  The SARS-CoV-2 RNA is generally detectable  in upper respiratory specimens during the acute phase of infection. The lowest concentration of SARS-CoV-2 viral copies this assay can detect is 138 copies/mL. A negative result does not preclude SARS-Cov-2 infection and should not be used as the sole basis for treatment or other patient management decisions. A negative result may occur with  improper specimen collection/handling, submission of specimen other than nasopharyngeal swab, presence of viral mutation(s) within the areas targeted by this assay, and inadequate number of viral copies(<138 copies/mL). A negative result must be combined with clinical observations, patient history, and  epidemiological information. The expected result is Negative.  Fact Sheet for Patients:  EntrepreneurPulse.com.au  Fact Sheet for Healthcare Providers:  IncredibleEmployment.be  This test is no t yet approved or cleared by the [place] FDA and  has been authorized for detection and/or diagnosis of SARS-CoV-2 by FDA under an Emergency Use Authorization (EUA). This EUA will remain  in effect (meaning this test can be used) for the duration of the COVID-19 declaration under Section 564(b)(1) of the Act, 21 U.S.C.section 360bbb-3(b)(1), unless the authorization is terminated  or revoked sooner.       Influenza A by PCR NEGATIVE NEGATIVE Final   Influenza B by PCR NEGATIVE NEGATIVE Final    Comment: (NOTE) The Xpert Xpress SARS-CoV-2/FLU/RSV plus assay is intended as an aid in the diagnosis of influenza from Nasopharyngeal swab specimens and should not be used as a sole basis for treatment. Nasal washings and aspirates are unacceptable for Xpert Xpress SARS-CoV-2/FLU/RSV testing.  Fact Sheet for Patients: EntrepreneurPulse.com.au  Fact Sheet for Healthcare Providers: IncredibleEmployment.be  This test is not yet approved or cleared by the [place] FDA and has been authorized for detection and/or diagnosis of SARS-CoV-2 by FDA under an Emergency Use Authorization (EUA). This EUA will remain in effect (meaning this test can be used) for the duration of the COVID-19 declaration under Section 564(b)(1) of the Act, 21 U.S.C. section 360bbb-3(b)(1), unless the authorization is terminated or revoked.  Performed at [facility], [place] [address], [place], [place] [address]      Time coordinating discharge: 35 minutes  SIGNED:   Aline August, MD  [facility] 04/08/2020, 10:41 AM
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
## 2020-04-09 ENCOUNTER — Other Ambulatory Visit: Payer: Self-pay | Admitting: Family Medicine

## 2020-04-09 DIAGNOSIS — E876 Hypokalemia: Secondary | ICD-10-CM

## 2020-04-09 DIAGNOSIS — I10 Essential (primary) hypertension: Secondary | ICD-10-CM

## 2020-04-09 DIAGNOSIS — F0391 Unspecified dementia with behavioral disturbance: Secondary | ICD-10-CM

## 2020-04-09 NOTE — Progress Notes (Signed)
Reviewed hospital admission  Recommend 1 week lab follow-up. Routing to scheduler to advise coming in for blood work next week. Can plan to move appointment on 12/20 up if needed

## 2020-04-10 ENCOUNTER — Ambulatory Visit: Payer: Medicare Other | Admitting: Family Medicine

## 2020-04-18 ENCOUNTER — Other Ambulatory Visit: Payer: Self-pay

## 2020-04-18 ENCOUNTER — Other Ambulatory Visit (INDEPENDENT_AMBULATORY_CARE_PROVIDER_SITE_OTHER): Payer: Medicare Other

## 2020-04-18 ENCOUNTER — Other Ambulatory Visit: Payer: Self-pay | Admitting: Family Medicine

## 2020-04-18 DIAGNOSIS — E876 Hypokalemia: Secondary | ICD-10-CM

## 2020-04-18 DIAGNOSIS — F0391 Unspecified dementia with behavioral disturbance: Secondary | ICD-10-CM

## 2020-04-18 DIAGNOSIS — I10 Essential (primary) hypertension: Secondary | ICD-10-CM | POA: Diagnosis not present

## 2020-04-18 DIAGNOSIS — F411 Generalized anxiety disorder: Secondary | ICD-10-CM

## 2020-04-18 DIAGNOSIS — F321 Major depressive disorder, single episode, moderate: Secondary | ICD-10-CM

## 2020-04-18 LAB — BASIC METABOLIC PANEL
BUN: 22 mg/dL (ref 6–23)
CO2: 31 mEq/L (ref 19–32)
Calcium: 9.5 mg/dL (ref 8.4–10.5)
Chloride: 106 mEq/L (ref 96–112)
Creatinine, Ser: 1.02 mg/dL (ref 0.40–1.20)
GFR: 46.45 mL/min — ABNORMAL LOW (ref >60.00)
Glucose, Bld: 112 mg/dL — ABNORMAL HIGH (ref 70–99)
Potassium: 4.2 mEq/L (ref 3.5–5.1)
Sodium: 142 mEq/L (ref 135–145)

## 2020-04-18 LAB — CBC WITH DIFFERENTIAL/PLATELET
Basophils Absolute: 0 10*3/uL (ref 0.0–0.1)
Basophils Relative: 0.9 % (ref 0.0–3.0)
Eosinophils Absolute: 0 10*3/uL (ref 0.0–0.7)
Eosinophils Relative: 1.4 % (ref 0.0–5.0)
HCT: 39.8 % (ref 36.0–46.0)
Hemoglobin: 13.3 g/dL (ref 12.0–15.0)
Lymphocytes Relative: 26.1 % (ref 12.0–46.0)
Lymphs Abs: 0.8 10*3/uL (ref 0.7–4.0)
MCHC: 33.3 g/dL (ref 30.0–36.0)
MCV: 89.1 fl (ref 78.0–100.0)
Monocytes Absolute: 0.3 10*3/uL (ref 0.1–1.0)
Monocytes Relative: 8.6 % (ref 3.0–12.0)
Neutro Abs: 1.9 10*3/uL (ref 1.4–7.7)
Neutrophils Relative %: 63 % (ref 43.0–77.0)
Platelets: 237 10*3/uL (ref 150.0–400.0)
RBC: 4.47 Mil/uL (ref 3.87–5.11)
RDW: 14.2 % (ref 11.5–15.5)
WBC: 3 10*3/uL — ABNORMAL LOW (ref 4.0–10.5)

## 2020-04-23 ENCOUNTER — Ambulatory Visit (INDEPENDENT_AMBULATORY_CARE_PROVIDER_SITE_OTHER): Payer: Medicare Other | Admitting: Family Medicine

## 2020-04-23 ENCOUNTER — Other Ambulatory Visit: Payer: Self-pay

## 2020-04-23 ENCOUNTER — Encounter: Payer: Self-pay | Admitting: Family Medicine

## 2020-04-23 VITALS — BP 118/50 | HR 94 | Temp 97.9°F | Ht 62.0 in | Wt 114.0 lb

## 2020-04-23 DIAGNOSIS — R42 Dizziness and giddiness: Secondary | ICD-10-CM | POA: Diagnosis not present

## 2020-04-23 DIAGNOSIS — I10 Essential (primary) hypertension: Secondary | ICD-10-CM | POA: Diagnosis not present

## 2020-04-23 DIAGNOSIS — F0391 Unspecified dementia with behavioral disturbance: Secondary | ICD-10-CM

## 2020-04-23 MED ORDER — OLMESARTAN MEDOXOMIL 40 MG PO TABS: 20.0000 mg | ORAL_TABLET | Freq: Every day | ORAL | 3 refills | Status: DC

## 2020-04-23 MED ORDER — OLANZAPINE 2.5 MG PO TABS: 2.5000 mg | ORAL_TABLET | Freq: Every day | ORAL | 0 refills | Status: DC

## 2020-04-23 NOTE — Progress Notes (Signed)
Subjective:     Ann Riddle is a 86 y.o. female presenting for Follow-up (3 weeks )     HPI  #dizzy spells - has to walk with a cane so she doesn't fall - daughter notes that she is complaining of random dizzy spells and will lean on the wall - improves with sitting down  #paranoia - olanzapine - seems to help at night with sleep - will be sleepy and groggy in the morning more than 50% of the time - still having some paranoia symptoms  Family helps with medication management Occasionally needs help with dressing  No need for help with toilieting Shower - has chair but also help just in case to prevent falls, helps with washing  No need for help feeding - family prepares meals Sometimes shaking hands and difficulty in the morning - requiring feeding assistance    Review of Systems  04/02/2020: Clinic - dementia - start donepezil, neurology referral 04/04/2020: Neurology - start olanzapine 5 mg, stop donepezil, cont lexapro and klonopin 12/4-12/09/2019: Admission - electrolyte abnormalities - low K and Ca - confusion  Social History   Tobacco Use  Smoking Status Never Smoker  Smokeless Tobacco Never Used        Objective:    BP Readings from Last 3 Encounters:  04/23/20 (!) 118/50  04/08/20 (!) 160/78  04/04/20 (!) 177/82   Wt Readings from Last 3 Encounters:  04/23/20 114 lb (51.7 kg)  04/08/20 112 lb 3.8 oz (50.9 kg)  04/04/20 112 lb 6.4 oz (51 kg)    BP (!) 118/50   Pulse 94   Temp 97.9 F (36.6 C) (Temporal)   Ht 5\' 2"  (1.575 m)   Wt 114 lb (51.7 kg)   SpO2 99%   BMI 20.85 kg/m    Physical Exam Constitutional:      General: She is not in acute distress.    Appearance: She is well-developed. She is not diaphoretic.  HENT:     Right Ear: External ear normal.     Left Ear: External ear normal.     Nose: Nose normal.  Eyes:     Conjunctiva/sclera: Conjunctivae normal.  Cardiovascular:     Rate and Rhythm: Normal rate and regular rhythm.      Heart sounds: Murmur heard.    Pulmonary:     Effort: Pulmonary effort is normal. No respiratory distress.     Breath sounds: Normal breath sounds. No wheezing.  Musculoskeletal:     Cervical back: Neck supple.  Skin:    General: Skin is warm and dry.     Capillary Refill: Capillary refill takes less than 2 seconds.  Neurological:     Mental Status: She is alert. Mental status is at baseline.  Psychiatric:        Mood and Affect: Mood normal.        Behavior: Behavior normal.     Comments: Occasionally confused with different history from daughter           Assessment & Plan:   Problem List Items Addressed This Visit      Cardiovascular and Mediastinum   Essential hypertension    BP low. Suspect this is contributing to her dizzy spells. Decrease olmesartan 40>20 mg. HH nursing and PT to help with bp monitoring. Update next week with symptoms      Relevant Medications   olmesartan (BENICAR) 40 MG tablet   Other Relevant Orders   Ambulatory referral to [place]  Nervous and Auditory   Dementia with behavioral disturbance ([place]) - Primary    Continues to have some paranoia - olanzapine with some improvement. However, overly sedated in the AM. Decrease olanzapine to 2.5 mg. Referral to Geriatric psych for additional support. Family also looking into memory care for long-term plan. Requires assistance with bathing, dressing, and occasionally eating.       Relevant Medications   OLANZapine (ZYPREXA) 2.5 MG tablet   Other Relevant Orders   Ambulatory referral to Psychiatry     Other   Dizziness    Suspect related to low blood pressure. However, will have [place] PT evaluate and provide some balance support. Reduce bp medication. May ultimately stop if needed. If not change consider ENT - however - symptoms do not seem consistent with vertigo but hx limited due to dementia. Pt using cane now to reduce risk of falling      Relevant Orders   Ambulatory referral to  [place]       Return in about 4 weeks (around 05/21/2020).  Lesleigh Noe, MD  This visit occurred during the SARS-CoV-2 public health emergency.  Safety protocols were in place, including screening questions prior to the visit, additional usage of staff PPE, and extensive cleaning of exam room while observing appropriate contact time as indicated for disinfecting solutions.

## 2020-04-23 NOTE — Patient Instructions (Addendum)
#  Dizzy spells - break Olmesartan pills in half -- take 20 mg daily - return in 4 weeks for blood pressure check - let me know if dizziness does not improve on lower dose - make sure you are drinking water  #Memory - referral to psych - decrease Olanzapine - new prescription -

## 2020-04-23 NOTE — Assessment & Plan Note (Addendum)
Continues to have some paranoia - olanzapine with some improvement. However, overly sedated in the AM. Decrease olanzapine to 2.5 mg. Referral to Geriatric psych for additional support. Family also looking into memory care for long-term plan. Requires assistance with bathing, dressing, and occasionally eating.

## 2020-04-23 NOTE — Assessment & Plan Note (Addendum)
Suspect related to low blood pressure. However, will have [place] PT evaluate and provide some balance support. Reduce bp medication. May ultimately stop if needed. If not change consider ENT - however - symptoms do not seem consistent with vertigo but hx limited due to dementia. Pt using cane now to reduce risk of falling

## 2020-04-23 NOTE — Assessment & Plan Note (Signed)
BP low. Suspect this is contributing to her dizzy spells. Decrease olmesartan 40>20 mg. HH nursing and PT to help with bp monitoring. Update next week with symptoms

## 2020-04-24 ENCOUNTER — Other Ambulatory Visit: Payer: Self-pay | Admitting: Family Medicine

## 2020-04-24 DIAGNOSIS — F0391 Unspecified dementia with behavioral disturbance: Secondary | ICD-10-CM

## 2020-05-01 ENCOUNTER — Telehealth: Payer: Self-pay

## 2020-05-01 NOTE — Telephone Encounter (Signed)
noted 

## 2020-05-01 NOTE — Telephone Encounter (Signed)
Ann Riddle with [employer] called as an FYI to report daughter wanted visit to be moved to this week and she is unable to reach daughter to set up time to access the pt

## 2020-05-02 ENCOUNTER — Telehealth: Payer: Self-pay

## 2020-05-02 NOTE — Telephone Encounter (Signed)
OK to extend start of care per patient request

## 2020-05-02 NOTE — Telephone Encounter (Signed)
Received call from [facility]: Ann Riddle   Patient needs to extend start of care to next week. Daughter requested that they start after [place]. Would like call back with verbal extension if approved.

## 2020-05-02 NOTE — Telephone Encounter (Signed)
Ann Riddle notified by telephone that is is okay to extend start of care per patient's request.

## 2020-05-08 ENCOUNTER — Telehealth: Payer: Self-pay | Admitting: *Deleted

## 2020-05-08 NOTE — Telephone Encounter (Signed)
Ann Riddle with [facility] left a voicemail stating that they were unable to reach the patient's caregiver today to do a visit. Ann Riddle stated that she was able to get in touch with the grandson. Ann Riddle stated that she went back to the house and the daughter refused the visit today. Ann Riddle wants to know if there is a particular date that Dr. Selena Batten would like for her to try and do a visit again.

## 2020-05-09 NOTE — Telephone Encounter (Signed)
Please reach out to family and see if they would still like HH involved. If so, ask when they would like to start.

## 2020-05-10 NOTE — Telephone Encounter (Signed)
Attempted to contact pt's daughter, Margarita Rana [facility]) but the line rang several times with no VM picking up.

## 2020-05-11 ENCOUNTER — Telehealth: Payer: Self-pay

## 2020-05-11 NOTE — Telephone Encounter (Signed)
Call received from [place] at [facility] would like verbal orders for evaluation for speech therapy. Ok to leave orders on voice mail at [phone]

## 2020-05-11 NOTE — Telephone Encounter (Signed)
Ok for speech evaluation.

## 2020-05-11 NOTE — Telephone Encounter (Signed)
Noted. Routing back to MA to try next week

## 2020-05-14 NOTE — Telephone Encounter (Signed)
Spoke to [place] at [facility] and gave verbal orders for a speech evaluation, per Dr. Einar Pheasant.

## 2020-05-18 ENCOUNTER — Telehealth: Payer: Self-pay

## 2020-05-18 NOTE — Telephone Encounter (Signed)
Delorse Limber with [facility] called to let you know the pt's daughter canceled home visit for today as she reports she is not feeling well

## 2020-05-18 NOTE — Telephone Encounter (Signed)
Noted  

## 2020-05-22 ENCOUNTER — Other Ambulatory Visit: Payer: Self-pay | Admitting: Family Medicine

## 2020-05-22 ENCOUNTER — Ambulatory Visit: Payer: Medicare Other | Admitting: Family Medicine

## 2020-05-22 DIAGNOSIS — F411 Generalized anxiety disorder: Secondary | ICD-10-CM

## 2020-05-22 DIAGNOSIS — F321 Major depressive disorder, single episode, moderate: Secondary | ICD-10-CM

## 2020-05-29 ENCOUNTER — Other Ambulatory Visit: Payer: Self-pay

## 2020-05-29 ENCOUNTER — Ambulatory Visit (INDEPENDENT_AMBULATORY_CARE_PROVIDER_SITE_OTHER): Payer: Medicare Other | Admitting: Family Medicine

## 2020-05-29 VITALS — BP 126/62 | HR 89 | Temp 97.7°F | Ht 62.0 in | Wt 126.0 lb

## 2020-05-29 DIAGNOSIS — F0391 Unspecified dementia with behavioral disturbance: Secondary | ICD-10-CM

## 2020-05-29 DIAGNOSIS — R52 Pain, unspecified: Secondary | ICD-10-CM | POA: Diagnosis not present

## 2020-05-29 DIAGNOSIS — I10 Essential (primary) hypertension: Secondary | ICD-10-CM | POA: Diagnosis not present

## 2020-05-29 DIAGNOSIS — K219 Gastro-esophageal reflux disease without esophagitis: Secondary | ICD-10-CM

## 2020-05-29 DIAGNOSIS — R200 Anesthesia of skin: Secondary | ICD-10-CM

## 2020-05-29 MED ORDER — GABAPENTIN 100 MG PO CAPS: 100.0000 mg | ORAL_CAPSULE | Freq: Every day | ORAL | 0 refills | Status: DC

## 2020-05-29 NOTE — Assessment & Plan Note (Signed)
Pt notes sensation of feeling unwell and some burning pain. Etiology unclear. Will check labs. Recent TSH and B12 were normal. CBC recently normal. Trial of gabapentin 100 mg at night (hold olanzapine for 2-3 nights while starting) to see if that helps symptoms. Lotion regularly.

## 2020-05-29 NOTE — Progress Notes (Signed)
Subjective:     Ann Riddle is a 87 y.o. female presenting for Follow-up (4 week )     HPI  #Feeling unwell - legs are numb and painful - also notes that she feels shaky - tremors - arm burning sensation at nighttime - not itchy - shakiness is more at night - will have pain and numbness in the legs at night  Endorses chest pain - has a rub that she used for her pain  - takes pepcid at night and also taking protonix 40 mg bid  Eating 3 meals per day - sandwich at lunch, drink ensure  # Dizziness - this improved with lowering bp medication  #dementia - not sleeping well at night - would like to return to 5 mg of olanzapine - more daytime sleeping  Review of Systems  Constitutional: Positive for fatigue. Negative for chills, diaphoresis and fever.  HENT: Negative for congestion.   Respiratory: Negative for cough.   Gastrointestinal: Positive for abdominal pain and nausea. Negative for constipation, diarrhea and vomiting.  Musculoskeletal: Positive for myalgias. Negative for arthralgias.  Neurological: Positive for headaches.   04/23/2020: Clinic - Low BP - decreased olmesartan and HH referral. Dementia w/ paranoia - olanzapine decrease and psych referral. Memory care is long term plan. Dizziness suspect 2/2 to bp  Social History   Tobacco Use  Smoking Status Never Smoker  Smokeless Tobacco Never Used        Objective:    BP Readings from Last 3 Encounters:  05/29/20 126/62  04/23/20 (!) 118/50  04/08/20 (!) 160/78   Wt Readings from Last 3 Encounters:  05/29/20 126 lb (57.2 kg)  04/23/20 114 lb (51.7 kg)  04/08/20 112 lb 3.8 oz (50.9 kg)    BP 126/62   Pulse 89   Temp 97.7 F (36.5 C) (Temporal)   Ht 5\' 2"  (1.575 m)   Wt 126 lb (57.2 kg)   SpO2 100%   BMI 23.05 kg/m    Physical Exam Constitutional:      General: She is not in acute distress.    Appearance: She is well-developed. She is not diaphoretic.  HENT:     Right Ear: External ear  normal.     Left Ear: External ear normal.  Eyes:     Conjunctiva/sclera: Conjunctivae normal.  Cardiovascular:     Rate and Rhythm: Normal rate and regular rhythm.     Heart sounds: Murmur heard.    Pulmonary:     Effort: Pulmonary effort is normal. No respiratory distress.     Breath sounds: Normal breath sounds. No wheezing.  Musculoskeletal:     Cervical back: Neck supple.     Right lower leg: Edema (1+ to ankle) present.     Left lower leg: Edema (trace to ankle) present.  Skin:    General: Skin is warm and dry.     Capillary Refill: Capillary refill takes less than 2 seconds.     Comments: Arms and legs without dry skin or erythema or rashes  Neurological:     Mental Status: She is alert. Mental status is at baseline.  Psychiatric:        Mood and Affect: Mood normal.        Behavior: Behavior normal.           Assessment & Plan:   Problem List Items Addressed This Visit      Cardiovascular and Mediastinum   Essential hypertension    BP improved and  dizziness improved. Cont olmesartan 20 mg daily        Digestive   GERD    Pt notes CP is consistent with her acid symptom suspect this is cause of some abdominal pain - notes she is going to the bathroom well. Cont protonix and pepcid.         Nervous and Auditory   Dementia with behavioral disturbance ([facility])    Daughter notes 2.5 olanzapine not helping with sleep at night. Will start gabapentin but if still no improvement may consider increasing back to 5 mg. Cont with plan for memory care      Relevant Medications   gabapentin (NEURONTIN) 100 MG capsule     Other   Burning pain - Primary    Pt notes sensation of feeling unwell and some burning pain. Etiology unclear. Will check labs. Recent TSH and B12 were normal. CBC recently normal. Trial of gabapentin 100 mg at night (hold olanzapine for 2-3 nights while starting) to see if that helps symptoms. Lotion regularly.       Relevant Medications    gabapentin (NEURONTIN) 100 MG capsule   Other Relevant Orders   Comprehensive metabolic panel   Bilateral leg numbness    Gabapentin at night      Relevant Medications   gabapentin (NEURONTIN) 100 MG capsule   Other Relevant Orders   Comprehensive metabolic panel       Return in about 4 weeks (around 06/26/2020).  Lesleigh Noe, MD  This visit occurred during the SARS-CoV-2 public health emergency.  Safety protocols were in place, including screening questions prior to the visit, additional usage of staff PPE, and extensive cleaning of exam room while observing appropriate contact time as indicated for disinfecting solutions.

## 2020-05-29 NOTE — Assessment & Plan Note (Signed)
Gabapentin at night

## 2020-05-29 NOTE — Patient Instructions (Signed)
Burning pain and numbness - Start taking Gabapentin 100 mg at night   Do not take with olanzapine the first few night  If not sleeping well or not feeling sedated after 2-3 nights  Ok to try 2.5 mg of Olanzapine   If the gabapentin is helping but not making the burning and numbness -- call next week

## 2020-05-29 NOTE — Assessment & Plan Note (Signed)
BP improved and dizziness improved. Cont olmesartan 20 mg daily

## 2020-05-29 NOTE — Assessment & Plan Note (Signed)
Pt notes CP is consistent with her acid symptom suspect this is cause of some abdominal pain - notes she is going to the bathroom well. Cont protonix and pepcid.

## 2020-05-29 NOTE — Assessment & Plan Note (Signed)
Daughter notes 2.5 olanzapine not helping with sleep at night. Will start gabapentin but if still no improvement may consider increasing back to 5 mg. Cont with plan for memory care

## 2020-05-30 ENCOUNTER — Other Ambulatory Visit: Payer: Self-pay | Admitting: Family Medicine

## 2020-05-30 DIAGNOSIS — F411 Generalized anxiety disorder: Secondary | ICD-10-CM

## 2020-05-30 LAB — COMPREHENSIVE METABOLIC PANEL
ALT: 14 U/L (ref 0–35)
AST: 19 U/L (ref 0–37)
Albumin: 3.8 g/dL (ref 3.5–5.2)
Alkaline Phosphatase: 68 U/L (ref 39–117)
BUN: 29 mg/dL — ABNORMAL HIGH (ref 6–23)
CO2: 31 mEq/L (ref 19–32)
Calcium: 9.6 mg/dL (ref 8.4–10.5)
Chloride: 106 mEq/L (ref 96–112)
Creatinine, Ser: 1 mg/dL (ref 0.40–1.20)
GFR: 47.53 mL/min — ABNORMAL LOW (ref >60.00)
Glucose, Bld: 120 mg/dL — ABNORMAL HIGH (ref 70–99)
Potassium: 4.7 mEq/L (ref 3.5–5.1)
Sodium: 141 mEq/L (ref 135–145)
Total Bilirubin: 0.4 mg/dL (ref 0.2–1.2)
Total Protein: 6.7 g/dL (ref 6.0–8.3)

## 2020-05-30 NOTE — Telephone Encounter (Signed)
Pharmacy requests refill on: Clonazepam 0.5 mg   LAST REFILL: 02/20/2020 (Q-30, R-0) LAST OV: 05/29/2020 NEXT OV: Not Scheduled  PHARMACY: [facility] [place], [place]

## 2020-05-31 ENCOUNTER — Telehealth: Payer: Self-pay

## 2020-05-31 NOTE — Telephone Encounter (Signed)
Ok to discharge due to family preference

## 2020-05-31 NOTE — Telephone Encounter (Signed)
Fuller Plan (? Spelling), LPN with [facility], is requesting verbal orders to discharge pt from their services.  Says pt's family will not allow HH to come out to see pt.  Plz advise [place] at [phone].

## 2020-06-01 NOTE — Telephone Encounter (Signed)
Left VM for [place], giving verbal orders to [facility], per Dr. Einar Pheasant.

## 2020-06-04 ENCOUNTER — Telehealth: Payer: Self-pay | Admitting: Family Medicine

## 2020-06-04 NOTE — Telephone Encounter (Signed)
Goal was 2 part   1) Burning leg pain  2) Sleep concerns  Has she noticed any worsening of sleep or confusion?   If not worse, could try taking 200 mg (2 capsules) at night to see if that helps with the burning pain.

## 2020-06-04 NOTE — Telephone Encounter (Signed)
Pt daugther called in stated that she was suppose to call in and tell how Ann Riddle doing on the Gabapentin and she stated she is not doing any better.

## 2020-06-08 NOTE — Telephone Encounter (Signed)
Called pt and left VM asking to return my call with an update in regards to her leg pain and sleep.

## 2020-06-08 NOTE — Telephone Encounter (Signed)
Pt's daughter, Ann Riddle, called me back.. She states that her mom hasn't said much about her legs burning, but now says her stomach and all over burns. There has been no improvement in pt's sleep and she is up all night walking. Confusion is no worse, but no better either.

## 2020-06-08 NOTE — Telephone Encounter (Signed)
Would recommend trying 200 mg of gabapentin at night.   Also, I would recommend a palliative care consult to see if we can better manage her symptoms.   They would come to the house. If willing to accept I can place the referral

## 2020-06-13 NOTE — Telephone Encounter (Signed)
Left VM on pts phone requesting a call back in reference to Dr. Verda Cumins message.

## 2020-06-14 ENCOUNTER — Telehealth: Payer: Self-pay | Admitting: Family Medicine

## 2020-06-14 NOTE — Telephone Encounter (Signed)
Patient is returning your call. Please call her back .EM

## 2020-06-14 NOTE — Telephone Encounter (Signed)
Error

## 2020-06-19 NOTE — Telephone Encounter (Signed)
Called and left detailed voicemail for patients daughter (ok per DPR) of Dr. Verda Cumins recommendations. Informed patients daughter to call back if she had any further questions or concerns, as well as if she was interested in the palliative care referral.

## 2020-06-25 ENCOUNTER — Other Ambulatory Visit: Payer: Self-pay | Admitting: Family Medicine

## 2020-06-25 DIAGNOSIS — R200 Anesthesia of skin: Secondary | ICD-10-CM

## 2020-06-25 DIAGNOSIS — R52 Pain, unspecified: Secondary | ICD-10-CM

## 2020-06-25 NOTE — Telephone Encounter (Signed)
Pharmacy requests refill on: Gabapentin 100 mg   LAST REFILL: 05/29/2020 (Q-30, R-0) LAST OV: 05/29/2020 NEXT OV: 07/03/2020 PHARMACY: [id number] [place], [place]

## 2020-06-26 MED ORDER — GABAPENTIN 100 MG PO CAPS: 200.0000 mg | ORAL_CAPSULE | Freq: Every day | ORAL | 1 refills | Status: DC

## 2020-06-26 NOTE — Telephone Encounter (Signed)
Spoke with patients daughter who stated that the 200 mg is working best for her mother.

## 2020-06-26 NOTE — Telephone Encounter (Signed)
Called and LVM for patients daughter to return call to office.

## 2020-06-26 NOTE — Telephone Encounter (Signed)
Please call pt to check in to see if Gabapentin is working and what dose is helping. They were advised to try 200 mg a few weeks ago.

## 2020-07-03 ENCOUNTER — Other Ambulatory Visit: Payer: Self-pay

## 2020-07-03 ENCOUNTER — Ambulatory Visit (INDEPENDENT_AMBULATORY_CARE_PROVIDER_SITE_OTHER): Payer: Medicare Other | Admitting: Family Medicine

## 2020-07-03 ENCOUNTER — Encounter: Payer: Self-pay | Admitting: Family Medicine

## 2020-07-03 VITALS — BP 140/70 | HR 95 | Temp 98.1°F | Ht 62.0 in | Wt 131.2 lb

## 2020-07-03 DIAGNOSIS — F0391 Unspecified dementia with behavioral disturbance: Secondary | ICD-10-CM | POA: Diagnosis not present

## 2020-07-03 DIAGNOSIS — R52 Pain, unspecified: Secondary | ICD-10-CM | POA: Diagnosis not present

## 2020-07-03 DIAGNOSIS — G4700 Insomnia, unspecified: Secondary | ICD-10-CM | POA: Diagnosis not present

## 2020-07-03 DIAGNOSIS — M79602 Pain in left arm: Secondary | ICD-10-CM | POA: Diagnosis not present

## 2020-07-03 MED ORDER — MIRTAZAPINE 15 MG PO TABS: 15.0000 mg | ORAL_TABLET | Freq: Every day | ORAL | 0 refills | Status: DC

## 2020-07-03 NOTE — Patient Instructions (Addendum)
#  Arm pain - no knot present - would recommend topical voltaren gel  #Sleep - stop olazapine and gabapentin - start Remeron 15 mg - monitor for more eating  - hopefully to help   Call if worsening confusion and behavior changes - as this may be a sign that the olanzapine was helping  Palliative care consult

## 2020-07-03 NOTE — Progress Notes (Signed)
Subjective:     Ann Riddle is a 87 y.o. female presenting for Follow-up (4 week )     HPI   #Left arm swelling - 2-3 weeks per report - pain at nighttime - the entire arm is achy - "knot" in the axilla area  #Burning - continues to have this symptom - no improvement with gabapentin - not able to sleep due to burning - burning is in arms and back - notes back spasms at nighttime   #Insomnia - typically sleeping during the day  - in a chair - no sleeping at night  #Knee  - right side - some "going out"  - will occasionally have such severe pain she doesn't make it to the bathroom  #Nocturia - getting up 4 times overnight to use the bathroom - wearing depends - also impacts her sleep - keeping her up  Review of Systems   Social History   Tobacco Use  Smoking Status Never Smoker  Smokeless Tobacco Never Used        Objective:    BP Readings from Last 3 Encounters:  07/03/20 140/70  05/29/20 126/62  04/23/20 (!) 118/50   Wt Readings from Last 3 Encounters:  07/03/20 131 lb 4 oz (59.5 kg)  05/29/20 126 lb (57.2 kg)  04/23/20 114 lb (51.7 kg)    BP 140/70   Pulse 95   Temp 98.1 F (36.7 C) (Temporal)   Ht 5\' 2"  (1.575 m)   Wt 131 lb 4 oz (59.5 kg)   SpO2 100%   BMI 24.01 kg/m    Physical Exam Constitutional:      General: She is not in acute distress.    Appearance: She is well-developed. She is not diaphoretic.  HENT:     Right Ear: External ear normal.     Left Ear: External ear normal.     Nose: Nose normal.  Eyes:     Conjunctiva/sclera: Conjunctivae normal.  Cardiovascular:     Rate and Rhythm: Normal rate and regular rhythm.     Heart sounds: Murmur heard.    Pulmonary:     Effort: Pulmonary effort is normal. No respiratory distress.     Breath sounds: Normal breath sounds. No wheezing.  Musculoskeletal:     Cervical back: Neck supple.     Comments: Decreased UE strength b/l TTP along the left rib cage of area of  concern for "knot" no palpable lesion or point tenderness on bone.   Skin:    General: Skin is warm and dry.     Capillary Refill: Capillary refill takes less than 2 seconds.  Neurological:     Mental Status: She is alert. Mental status is at baseline.  Psychiatric:        Mood and Affect: Mood normal.        Behavior: Behavior normal.           Assessment & Plan:   Problem List Items Addressed This Visit      Nervous and Auditory   Dementia with behavioral disturbance ([place])    Still with occasional severe confusion at night. Will do trial off olanzapine due to weight gain/dry mouth as likely side effects. Currently on waitlist for memory care. Referral to palliative for continued support.       Relevant Medications   mirtazapine (REMERON) 15 MG tablet   Other Relevant Orders   Amb Referral to Palliative Care     Other   Insomnia - Primary  Persisting w/o improvement with olanzapine or gabapentin. Trial of Remeron 15 mg. Palliative referral for continued support and close monitoring. Pain and nocturia likely contributing but options are limited 2/2 to dementia       Relevant Medications   mirtazapine (REMERON) 15 MG tablet   Other Relevant Orders   Amb Referral to Palliative Care   Burning pain    No improvement with gabapentin. Will stop. Difficulty managing symptoms will refer to palliative for continued support. Primary reason she is up at night so will also focus on insomnia control.       Relevant Orders   Amb Referral to Palliative Care   Left arm pain    Suspect muscle strain near the axilla. Recommend voltaren gel.           Return in about 6 weeks (around 08/14/2020).  Lesleigh Noe, MD  This visit occurred during the SARS-CoV-2 public health emergency.  Safety protocols were in place, including screening questions prior to the visit, additional usage of staff PPE, and extensive cleaning of exam room while observing appropriate contact time as  indicated for disinfecting solutions.

## 2020-07-03 NOTE — Assessment & Plan Note (Signed)
Persisting w/o improvement with olanzapine or gabapentin. Trial of Remeron 15 mg. Palliative referral for continued support and close monitoring. Pain and nocturia likely contributing but options are limited 2/2 to dementia

## 2020-07-03 NOTE — Assessment & Plan Note (Signed)
Still with occasional severe confusion at night. Will do trial off olanzapine due to weight gain/dry mouth as likely side effects. Currently on waitlist for memory care. Referral to palliative for continued support.

## 2020-07-03 NOTE — Assessment & Plan Note (Signed)
No improvement with gabapentin. Will stop. Difficulty managing symptoms will refer to palliative for continued support. Primary reason she is up at night so will also focus on insomnia control.

## 2020-07-03 NOTE — Assessment & Plan Note (Signed)
Suspect muscle strain near the axilla. Recommend voltaren gel.

## 2020-07-04 ENCOUNTER — Telehealth: Payer: Self-pay

## 2020-07-04 NOTE — Telephone Encounter (Signed)
Attempted to contact patient's daughter Aquinittia to schedule a Palliative Care consult appointment. No answer left a message to return call.

## 2020-07-12 ENCOUNTER — Telehealth: Payer: Self-pay

## 2020-07-12 NOTE — Telephone Encounter (Signed)
Attempted to contact patient's daughter Aquinittia to schedule a Palliative Care consult appointment. No answer left a message to return call.

## 2020-07-14 ENCOUNTER — Other Ambulatory Visit: Payer: Self-pay | Admitting: Family Medicine

## 2020-07-14 DIAGNOSIS — F411 Generalized anxiety disorder: Secondary | ICD-10-CM

## 2020-07-14 DIAGNOSIS — F321 Major depressive disorder, single episode, moderate: Secondary | ICD-10-CM

## 2020-07-19 ENCOUNTER — Telehealth: Payer: Self-pay

## 2020-07-19 ENCOUNTER — Other Ambulatory Visit: Payer: Self-pay | Admitting: Family Medicine

## 2020-07-19 DIAGNOSIS — F0391 Unspecified dementia with behavioral disturbance: Secondary | ICD-10-CM

## 2020-07-19 NOTE — Telephone Encounter (Signed)
Patient's daughter Alvina Chou returned my call and left a voicemail stating she was not interested in Palliative care at this time. Referral canceled. PCP notified.

## 2020-07-19 NOTE — Telephone Encounter (Signed)
Noted  

## 2020-07-19 NOTE — Telephone Encounter (Signed)
Third attempt to contact patient's daughter Aquinittia to schedule a Palliative Care consult appointment. No answer left a message to return cal by Friday if possible. If no return call by Monday 3/21 will cancel referral and notify PCP.

## 2020-07-21 ENCOUNTER — Other Ambulatory Visit: Payer: Self-pay | Admitting: Family Medicine

## 2020-07-21 DIAGNOSIS — F411 Generalized anxiety disorder: Secondary | ICD-10-CM

## 2020-07-23 ENCOUNTER — Other Ambulatory Visit: Payer: Self-pay | Admitting: Family Medicine

## 2020-07-23 DIAGNOSIS — G4700 Insomnia, unspecified: Secondary | ICD-10-CM

## 2020-07-24 ENCOUNTER — Other Ambulatory Visit: Payer: Self-pay | Admitting: Family Medicine

## 2020-07-24 DIAGNOSIS — F0391 Unspecified dementia with behavioral disturbance: Secondary | ICD-10-CM

## 2020-07-24 NOTE — Telephone Encounter (Signed)
Last OV: 07/03/20 Last refill: 05/30/20 #30 no refills  Next OV: 08/16/20

## 2020-07-31 ENCOUNTER — Other Ambulatory Visit: Payer: Self-pay | Admitting: Family Medicine

## 2020-07-31 NOTE — Telephone Encounter (Signed)
Last OV: [id number] Last refill: 10/26/19 by historical provider Next OV: 08/16/20

## 2020-08-02 ENCOUNTER — Telehealth: Payer: Self-pay

## 2020-08-02 ENCOUNTER — Telehealth: Payer: Self-pay | Admitting: Family Medicine

## 2020-08-02 DIAGNOSIS — F0391 Unspecified dementia with behavioral disturbance: Secondary | ICD-10-CM

## 2020-08-02 DIAGNOSIS — F321 Major depressive disorder, single episode, moderate: Secondary | ICD-10-CM

## 2020-08-02 DIAGNOSIS — R52 Pain, unspecified: Secondary | ICD-10-CM

## 2020-08-02 NOTE — Telephone Encounter (Signed)
Please call daughter and verify that she is [place] with having someone come to the house. I've placed the referral.

## 2020-08-02 NOTE — Telephone Encounter (Signed)
Attempted to contact patient's daughter to schedule a Palliative Care consult appointment. No answer left a message to return call.  

## 2020-08-02 NOTE — Telephone Encounter (Signed)
Daughter called in and wanted to know about getting the palliative care now for her mom.

## 2020-08-02 NOTE — Telephone Encounter (Signed)
Spoke with patient's daughter and scheduled an in-person Palliative Consult for 08/22/20 @ 3PM  COVID screening was negative. No pets in home. Patient lives with daughter  Consent obtained; updated Outlook/Netsmart/Team List and Epic.  Family is aware they may be receiving a call from NP the day before or day of to confirm appointment.

## 2020-08-07 NOTE — Telephone Encounter (Signed)
Daughter is ok with this. Appt has been made for 4/20 for palliative care consult.

## 2020-08-16 ENCOUNTER — Ambulatory Visit: Payer: Medicare Other | Admitting: Family Medicine

## 2020-08-17 ENCOUNTER — Other Ambulatory Visit: Payer: Self-pay | Admitting: Family Medicine

## 2020-08-17 DIAGNOSIS — G4700 Insomnia, unspecified: Secondary | ICD-10-CM

## 2020-08-22 ENCOUNTER — Other Ambulatory Visit: Payer: Medicare Other | Admitting: Hospice

## 2020-08-22 ENCOUNTER — Other Ambulatory Visit: Payer: Self-pay

## 2020-08-22 DIAGNOSIS — K219 Gastro-esophageal reflux disease without esophagitis: Secondary | ICD-10-CM

## 2020-08-22 DIAGNOSIS — F0151 Vascular dementia with behavioral disturbance: Secondary | ICD-10-CM

## 2020-08-22 DIAGNOSIS — R1013 Epigastric pain: Secondary | ICD-10-CM

## 2020-08-22 DIAGNOSIS — F22 Delusional disorders: Secondary | ICD-10-CM

## 2020-08-22 DIAGNOSIS — Z515 Encounter for palliative care: Secondary | ICD-10-CM

## 2020-08-22 DIAGNOSIS — F01518 Vascular dementia, unspecified severity, with other behavioral disturbance: Secondary | ICD-10-CM

## 2020-08-22 NOTE — Progress Notes (Signed)
[facility] Consult Note Telephone: [phone]  Fax: [phone]  PATIENT NAME: Ann Riddle [id number] [phone] (home)  DOB: 1922/04/11 MRN: [id number]  PRIMARY CARE PROVIDER:    Lesleigh Noe, MD,  [place] [address] [phone]  REFERRING PROVIDER:   Lesleigh Noe, MD [place],  [place] [address] [phone]  RESPONSIBLE PARTY:  Patient's daughter Aquinittia [phone] Contact Information    Name Relation Home Work Mobile   [place] Daughter [phone]     [place] Constable   [place] Daughter   [phone]   sellers, Juel Burrow   [phone]      I met face to face with patient and family at home. Palliative Care was asked to follow this patient by consultation request of  Lesleigh Noe, MD to address advance care planning and complex medical decision making. This is the initial visit. Pamala Hurry is present with patient during visit.     ASSESSMENT AND / RECOMMENDATIONS:   Advance Care Planning: Our advance care planning conversation included a discussion about:     The value and importance of advance care planning   Difference between Hospice and Palliative care  Exploration of goals of care in the event of a sudden injury or illness   Identification and preparation of a healthcare agent   Review and updating or creation of an  advance directive document .  Decision not to resuscitate or to de-escalate disease focused treatments due to poor prognosis.  CODE STATUS: Extensive discussion of implications of code status.  [place] affirmed patient is a DNR. NP signed DNR for patient to keep at home; same document uploaded to Epic today.  Goals of Care: Goals include to maximize quality of life and symptom management  I spent 46 minutes providing this initial consultation. More  than 50% of the time in this consultation was spent on counseling patient and coordinating communication. --------------------------------------------------------------------------------------------------------------------------------------  Symptom Management/Plan:  Dyspepsia likely related to hx of GERD: Protonix $RemoveBefore'40mg'[id number]$  BID daily before meals. Pepcid - Famotidine $RemoveBefo'20mg'wQOnXuRRxRi$  at bedtime. Education on  reflux precautions, eat food sitting up and up for at least 30 mins- 1 hour after food. Avoid spicy/fried foods or any food that triggers the heart burn.  Insomnia/Poor appetite:  Mirtazapine as ordered. Dementia: FAST 6C ambulatory with a cane. FLACC 0. Daughter reports patient is sometimes paranoid/distrustful. Encouraged supportive care, distraction, familiar environment. Safety precautions. Education on the progressive nature of Dementia. Follow up: Palliative care will continue to follow for complex medical decision making, advance care planning, and clarification of goals. Return 6 weeks or prn.Encouraged to call provider sooner with any concerns.   PPS: 50%  HOSPICE ELIGIBILITY/DIAGNOSIS: TBD  Chief Complaint: Palliative initial visit/ Dyspepsia  HISTORY OF PRESENT ILLNESS:  Ann Riddle is a 87 y.o. year old female  with multiple medical conditions including heart burn , chronic, intermittent, worsened in the last month. It gives her nausea and sometimes affect her appetite. It is worse at night when she is laying down. Mylanta is helpful. History of anxiety, agitation, Dementia with behavioral disturbance, paranoia, GERD.  History obtained from review of EMR, discussion with primary team, caregiver, family and/or Ms. Netzel.  Review and summarization of Epic records shows history from other than patient. Rest of 10 point ROS asked and negative.  Palliative Care was asked to follow this patient by consultation request of  Lesleigh Noe, MD to help address complex decision making in the context  of advance care planning and goals of care clarification.    Review of lab tests/diagnostics   Results for ANGELYNE, TERWILLIGER (MRN [id number]) as of 08/22/2020 15:21  Ref. Range 05/29/2020 17:09  Albumin Latest Ref Range: 3.5 - 5.2 g/dL 3.8  AST Latest Ref Range: 0 - 37 U/L 19  ALT Latest Ref Range: 0 - 35 U/L 14  Total Protein Latest Ref Range: 6.0 - 8.3 g/dL 6.7  Total Bilirubin Latest Ref Range: 0.2 - 1.2 mg/dL 0.4  GFR Latest Ref Range: >60.00 mL/min 47.53 (L)   ROS General: NAD EYES: denies vision changes ENMT: denies dysphagia Cardiovascular: denies chest pain/discomfort Pulmonary: denies cough, denies SOB Abdomen: endorses good appetite, denies constipation, endorses continence of bowel; endorsed heart burn GU: denies dysuria, urinary frequency MSK:  denies weakness,  no falls reported Skin: denies rashes or wounds Neurological: denies pain, denies insomnia Psych: Endorses positive mood Heme/lymph/immuno: denies bruises, abnormal bleeding  Physical Exam:  BP 128/70 P71 02 98% RA R18 Height/Weight 5 feet 4 inches, 130 Ibs Constitutional: NAD General: Well groomed EYES: anicteric sclera, lids intact, no discharge, wears glasses ENMT: Moist mucous membrane CV: S1 S2, RRR, no LE edema Pulmonary: LCTA, no increased work of breathing, no cough, Abdomen: active BS + 4 quadrants, soft and non tender, no ascites GU: no suprapubic tenderness MSK: weakness, sarcopenia, ambulatory  Skin: warm and dry, no rashes or wounds on visible skin, hair loss to BLE Neuro:  weakness, otherwise non focal Psych: non-anxious affect Hem/lymph/immuno: no widespread bruising   PAST MEDICAL HISTORY:  Active Ambulatory Problems    Diagnosis Date Noted  . Hyperlipidemia 08/04/2007  . Essential hypertension 08/04/2007  . GERD 08/04/2007  . Constipation 11/10/2007  . IBS 08/04/2007  . Chest pain 11/13/2015  . Abnormal nuclear cardiac imaging test   . Porokeratosis 10/26/2019  . Insomnia  01/30/2020  . Current moderate episode of major depressive disorder without prior episode ([facility]) 01/30/2020  . Generalized anxiety disorder 01/30/2020  . Osteopenia 02/22/2020  . Atherosclerosis of aorta ([facility]) 02/29/2020  . Vaginal lesion 02/29/2020  . Psychosis ([facility])   . Hallucinations   . Paranoia ([facility])   . Dementia with behavioral disturbance ([facility]) 04/02/2020  . Hypoglycemia 04/07/2020  . Dizziness 04/23/2020  . Burning pain 05/29/2020  . Bilateral leg numbness 05/29/2020  . Left arm pain 07/03/2020   Resolved Ambulatory Problems    Diagnosis Date Noted  . HERNIA, VENTRAL 08/04/2007  . HIATAL HERNIA 08/04/2007  . DIVERTICULOSIS, MILD 08/04/2007  . ABDOMINAL PAIN-EPIGASTRIC 05/31/2008  . PERSONAL HX COLONIC POLYPS 11/10/2007  . Hypokalemia 04/07/2020  . Hypocalcemia 04/07/2020  . Acute metabolic encephalopathy 02/58/5277   Past Medical History:  Diagnosis Date  . Dementia ([facility])   . Other and unspecified hyperlipidemia   . Unspecified essential hypertension     SOCIAL HX:  Social History   Tobacco Use  . Smoking status: Never Smoker  . Smokeless tobacco: Never Used  Substance Use Topics  . Alcohol use: No    Family /Caregiver/Community Supports: Patient lives at home with her daughter Tami Ribas   FAMILY HX:  Family History  Problem Relation Age of Onset  . Breast cancer Daughter 12  . Colon cancer Neg Hx       ALLERGIES: No Known Allergies    PERTINENT MEDICATIONS:  Outpatient Encounter Medications as of 08/22/2020  Medication Sig  . clonazePAM (KLONOPIN) 0.5 MG tablet TAKE  1 TABLET (0.5 MG TOTAL) BY MOUTH AT BEDTIME AS NEEDED FOR ANXIETY (SLEEP).  Marland Kitchen LINZESS 290 MCG CAPS capsule TAKE 1 CAPSULE BY MOUTH DAILY 30 MINUTES BEFORE BREAKFAST FOR CONSTIPATION. HOLD FOR LIQUID STOOLS  . acetaminophen (TYLENOL) 650 MG CR tablet Take 650 mg by mouth daily as needed (headache).  Marland Kitchen aspirin EC 81 MG tablet Take 81 mg by mouth daily.  . colesevelam (WELCHOL) 625 MG  tablet Take 1 tablet (625 mg total) by mouth daily.  Marland Kitchen ENSURE (ENSURE) Take 237 mLs by mouth See admin instructions. Drink one can (237 mls) by mouth once or twice daily  . famotidine (PEPCID) 20 MG tablet Take 1 tablet (20 mg total) by mouth at bedtime.  . hyoscyamine (LEVSIN SL) 0.125 MG SL tablet Place 1 tablet (0.125 mg total) under the tongue every 8 (eight) hours as needed (chest pains/acid reflux).  . mirtazapine (REMERON) 15 MG tablet TAKE 1 TABLET BY MOUTH EVERYDAY AT BEDTIME  . Multiple Vitamin (MULTIVITAMIN WITH MINERALS) TABS tablet Take 1 tablet by mouth daily.  . nitroGLYCERIN (NITROSTAT) 0.4 MG SL tablet Place 1 tablet (0.4 mg total) under the tongue every 5 (five) minutes as needed for chest pain.  Marland Kitchen olmesartan (BENICAR) 40 MG tablet Take 0.5 tablets (20 mg total) by mouth daily.  . pantoprazole (PROTONIX) 40 MG tablet Take 1 tablet (40 mg total) by mouth 2 (two) times daily before a meal.  . polyethylene glycol (MIRALAX / GLYCOLAX) packet Take 17 g by mouth daily.    No facility-administered encounter medications on file as of 08/22/2020.    Thank you for the opportunity to participate in the care of Ms. Proto.  The palliative care team will continue to follow. Please call our office at [phone] if we can be of additional assistance.   Note: Portions of this note were generated with [employer]. Dictation errors may occur despite best attempts at proofreading.  Teodoro Spray, NP

## 2020-08-29 ENCOUNTER — Other Ambulatory Visit: Payer: Self-pay | Admitting: Family Medicine

## 2020-08-29 DIAGNOSIS — F411 Generalized anxiety disorder: Secondary | ICD-10-CM

## 2020-08-30 NOTE — Telephone Encounter (Signed)
Last OV: 07/03/20  Last refill: 07/24/20 #30 no refills  No future appts scheduled.

## 2020-09-04 ENCOUNTER — Encounter: Payer: Self-pay | Admitting: Family Medicine

## 2020-09-04 ENCOUNTER — Ambulatory Visit (INDEPENDENT_AMBULATORY_CARE_PROVIDER_SITE_OTHER): Payer: Medicare Other | Admitting: Family Medicine

## 2020-09-04 ENCOUNTER — Other Ambulatory Visit: Payer: Self-pay

## 2020-09-04 VITALS — BP 140/70 | HR 92 | Temp 98.2°F | Ht 62.0 in | Wt 123.5 lb

## 2020-09-04 DIAGNOSIS — F0151 Vascular dementia with behavioral disturbance: Secondary | ICD-10-CM | POA: Diagnosis not present

## 2020-09-04 DIAGNOSIS — G4700 Insomnia, unspecified: Secondary | ICD-10-CM

## 2020-09-04 DIAGNOSIS — K219 Gastro-esophageal reflux disease without esophagitis: Secondary | ICD-10-CM

## 2020-09-04 DIAGNOSIS — F01518 Vascular dementia, unspecified severity, with other behavioral disturbance: Secondary | ICD-10-CM

## 2020-09-04 MED ORDER — FAMOTIDINE 20 MG PO TABS: 20.0000 mg | ORAL_TABLET | Freq: Two times a day (BID) | ORAL | 3 refills | Status: DC

## 2020-09-04 NOTE — Assessment & Plan Note (Signed)
Behavioral issues have stabilized. Still awaiting memory care but was moved up from #50 to #8 position. Working with palliative care to help with symptoms. Return in 2 months for check-in

## 2020-09-04 NOTE — Patient Instructions (Signed)
Increase pepcid (famotidine) 20 mg twice daily - continue pantoprazole 40 mg twice daily - cont Mylanta as needed  Call Dr. Ardis Hughs office to schedule a follow-up visit

## 2020-09-04 NOTE — Assessment & Plan Note (Signed)
Persistent symptoms. Reviewed Dr. Ardis Hughs note from 03/2020 where carafate was stopped. Considered restarting but will do trial of famotidine 20 mg BID in addition to continuing pantoprazole 40 mg BID. Advised calling dr. Ardis Hughs office for f/u in 2-4 weeks.

## 2020-09-04 NOTE — Progress Notes (Signed)
Subjective:     Ann Riddle is a 87 y.o. female presenting for Heartburn (Burping excessively after food and drink )     HPI   #Heartburn - talked with palliative care about this - taking pantoprazole 40 mg BID - taking famotidine 20 mg at bedtime - mylanta as well - sitting up for 30 minutes after eating - trying to avoid triggers - roasted chicken, beans, yams primary diet  -   #insomnia - taking clonazepam - no longer having hallucinations  Review of Systems   Social History   Tobacco Use  Smoking Status Never Smoker  Smokeless Tobacco Never Used        Objective:    BP Readings from Last 3 Encounters:  09/04/20 140/70  07/03/20 140/70  05/29/20 126/62   Wt Readings from Last 3 Encounters:  09/04/20 123 lb 8 oz (56 kg)  07/03/20 131 lb 4 oz (59.5 kg)  05/29/20 126 lb (57.2 kg)    BP 140/70   Pulse 92   Temp 98.2 F (36.8 C) (Temporal)   Ht 5\' 2"  (1.575 m)   Wt 123 lb 8 oz (56 kg)   SpO2 97%   BMI 22.59 kg/m    Physical Exam Constitutional:      General: She is not in acute distress.    Appearance: She is well-developed. She is not diaphoretic.  HENT:     Right Ear: External ear normal.     Left Ear: External ear normal.  Eyes:     Conjunctiva/sclera: Conjunctivae normal.  Cardiovascular:     Rate and Rhythm: Normal rate.  Pulmonary:     Effort: Pulmonary effort is normal.  Musculoskeletal:     Cervical back: Neck supple.  Skin:    General: Skin is warm and dry.     Capillary Refill: Capillary refill takes less than 2 seconds.  Neurological:     Mental Status: She is alert. Mental status is at baseline.  Psychiatric:        Mood and Affect: Mood normal.        Behavior: Behavior normal.           Assessment & Plan:   Problem List Items Addressed This Visit      Digestive   GERD - Primary    Persistent symptoms. Reviewed Dr. Ardis Hughs note from 03/2020 where carafate was stopped. Considered restarting but will do  trial of famotidine 20 mg BID in addition to continuing pantoprazole 40 mg BID. Advised calling dr. Ardis Hughs office for f/u in 2-4 weeks.       Relevant Medications   famotidine (PEPCID) 20 MG tablet     Nervous and Auditory   Dementia with behavioral disturbance ([facility])    Behavioral issues have stabilized. Still awaiting memory care but was moved up from #50 to #8 position. Working with palliative care to help with symptoms. Return in 2 months for check-in        Other   Insomnia    Responding to Clonazepam w/o worsening of hallucinations. Cont klonopin 0.5 mg nightly          Return in about 2 months (around 11/04/2020) for routine follow-up .  Lesleigh Noe, MD  This visit occurred during the SARS-CoV-2 public health emergency.  Safety protocols were in place, including screening questions prior to the visit, additional usage of staff PPE, and extensive cleaning of exam room while observing appropriate contact time as indicated for disinfecting solutions.

## 2020-09-04 NOTE — Assessment & Plan Note (Signed)
Responding to Clonazepam w/o worsening of hallucinations. Cont klonopin 0.5 mg nightly

## 2020-09-17 ENCOUNTER — Telehealth: Payer: Self-pay

## 2020-09-17 NOTE — Telephone Encounter (Signed)
Daughter called to report Klonopin is not helping and pt is still getting up at night and is trying to leave the house, saying that someone is trying to get her

## 2020-09-17 NOTE — Telephone Encounter (Signed)
When was the change? When I saw her on 09/04/2020 things were going well.   Would recommend follow-up appointment as change in symptoms

## 2020-09-18 NOTE — Telephone Encounter (Signed)
Called and LVM for patients daughter to call back to schedule follow-up visit.

## 2020-09-19 NOTE — Telephone Encounter (Signed)
Noted, will see tomorrow and consider options.

## 2020-09-19 NOTE — Telephone Encounter (Addendum)
I spoke with Aquinittia and she said that pt is getting worse and wanting to leave the house. Aquinittia is with pt all the time but once pt got out of house and sat down in drive way and told a neighbor that Aquinittia was trying to kill her. Aquinittia said that pt has her days and something triggers pt and she wants to leave her home. Does not happen everyday and Aquinittia did not give me date of onset of pt wanting to leave the house.Pt has h/as for at least one month; tylenol does not help h/a; no other covid symptoms. Aquinittia scheduled in office appt with Dr Einar Pheasant on 09/20/20  With ED precautions given and Aquinittia voiced understanding. Sending to Dr Einar Pheasant who is out of office and sending to Gentry Fitz NP who is in office.

## 2020-09-20 ENCOUNTER — Ambulatory Visit (INDEPENDENT_AMBULATORY_CARE_PROVIDER_SITE_OTHER): Payer: Medicare Other | Admitting: Family Medicine

## 2020-09-20 ENCOUNTER — Other Ambulatory Visit: Payer: Self-pay

## 2020-09-20 VITALS — BP 152/78 | HR 97 | Temp 97.2°F | Wt 122.2 lb

## 2020-09-20 DIAGNOSIS — F0151 Vascular dementia with behavioral disturbance: Secondary | ICD-10-CM | POA: Diagnosis not present

## 2020-09-20 DIAGNOSIS — G4700 Insomnia, unspecified: Secondary | ICD-10-CM | POA: Diagnosis not present

## 2020-09-20 DIAGNOSIS — F01518 Vascular dementia, unspecified severity, with other behavioral disturbance: Secondary | ICD-10-CM

## 2020-09-20 MED ORDER — QUETIAPINE FUMARATE 25 MG PO TABS: 12.5000 mg | ORAL_TABLET | Freq: Every day | ORAL | 1 refills | Status: DC

## 2020-09-20 NOTE — Assessment & Plan Note (Signed)
Poorly controlled with nighttime wakening with paranoia. Starting seroquel to try and help manage symptoms. Advised holding clonazepam 0.5 mg while initiating seroquel. Ok to continue mirtazapine. If paranoia improves but still not sleeping may consider dose increase of mirtazapine or clonazepam.

## 2020-09-20 NOTE — Patient Instructions (Signed)
1) Call Neurology to schedule follow-up visit in 2 weeks  [phone]     2) Start Seroquel - 12.5 mg - hold Clonazepam tonight until you see how she responds - If no improvement and tolerating increase to 25 mg  - Call to update on Monday - Cont Mirtazapine

## 2020-09-20 NOTE — Progress Notes (Signed)
Subjective:     Ann Riddle is a 87 y.o. female presenting for Paranoid (Roaming and thinks someone is coming to get her. "states that if she goes back in the house they will take her in the woods and kill her."/Pt's daughter would like increase in Klonopin )     HPI  #dementia - has been leaving the house - going into the yard  - daughter notes Ann Riddle is up all night trying to leave in the middle of the night - yesterday at 2 am she was sitting in the driveway - neighbor helped her get back into the house - she was telling people "that they will take her out and kill her"  - daughter notes she is up walking all day long  Endorses some pain in her chest Endorses sleeping good - waking up to use the bathroom overnight  Review of Systems   Social History   Tobacco Use  Smoking Status Never Smoker  Smokeless Tobacco Never Used        Objective:    BP Readings from Last 3 Encounters:  09/20/20 (!) 152/78  09/04/20 140/70  07/03/20 140/70   Wt Readings from Last 3 Encounters:  09/20/20 122 lb 4 oz (55.5 kg)  09/04/20 123 lb 8 oz (56 kg)  07/03/20 131 lb 4 oz (59.5 kg)    BP (!) 152/78   Pulse 97   Temp (!) 97.2 F (36.2 C) (Temporal)   Wt 122 lb 4 oz (55.5 kg)   SpO2 98%   BMI 22.36 kg/m    Physical Exam Constitutional:      General: She is not in acute distress.    Appearance: She is well-developed. She is not diaphoretic.  HENT:     Right Ear: External ear normal.     Left Ear: External ear normal.  Eyes:     Conjunctiva/sclera: Conjunctivae normal.  Cardiovascular:     Rate and Rhythm: Normal rate and regular rhythm.     Heart sounds: Murmur heard.    Pulmonary:     Effort: Pulmonary effort is normal. No respiratory distress.     Breath sounds: Normal breath sounds. No wheezing.  Musculoskeletal:     Cervical back: Neck supple.  Skin:    General: Skin is warm and dry.     Capillary Refill: Capillary refill takes less than 2 seconds.   Neurological:     Mental Status: She is alert. Mental status is at baseline.  Psychiatric:     Comments: Provides different history than her daughter           Assessment & Plan:   Problem List Items Addressed This Visit      Nervous and Auditory   Dementia with behavioral disturbance ([facility]) - Primary    Worsening symptoms. Had been stable for a few months but now worse with paranoia and leaving the house. Intolerant of olanzapine 2/2 to sedation. Start seroquel 12.5 mg at bedtime. Can increase to 25 mg if tolerating but ineffective. Call next week to check in and plan to follow-up with neurology for ongoing care.        Relevant Medications   QUEtiapine (SEROQUEL) 25 MG tablet     Other   Insomnia    Poorly controlled with nighttime wakening with paranoia. Starting seroquel to try and help manage symptoms. Advised holding clonazepam 0.5 mg while initiating seroquel. Ok to continue mirtazapine. If paranoia improves but still not sleeping may consider dose increase  of mirtazapine or clonazepam.           Return in about 2 weeks (around 10/04/2020) for to see neurology.  Lesleigh Noe, MD  This visit occurred during the SARS-CoV-2 public health emergency.  Safety protocols were in place, including screening questions prior to the visit, additional usage of staff PPE, and extensive cleaning of exam room while observing appropriate contact time as indicated for disinfecting solutions.

## 2020-09-20 NOTE — Assessment & Plan Note (Signed)
Worsening symptoms. Had been stable for a few months but now worse with paranoia and leaving the house. Intolerant of olanzapine 2/2 to sedation. Start seroquel 12.5 mg at bedtime. Can increase to 25 mg if tolerating but ineffective. Call next week to check in and plan to follow-up with neurology for ongoing care.

## 2020-09-26 ENCOUNTER — Telehealth: Payer: Self-pay | Admitting: Family Medicine

## 2020-09-26 NOTE — Telephone Encounter (Signed)
Discussed with Daughter  Sleeping better on seroquel 25 mg  If no improvement start taking 12.5 mg in the morning and continue 25 mg at nighttime.   Update next week

## 2020-09-28 ENCOUNTER — Ambulatory Visit: Payer: Medicare Other | Admitting: Nurse Practitioner

## 2020-09-30 ENCOUNTER — Other Ambulatory Visit: Payer: Self-pay | Admitting: Family Medicine

## 2020-09-30 DIAGNOSIS — F0391 Unspecified dementia with behavioral disturbance: Secondary | ICD-10-CM

## 2020-10-03 ENCOUNTER — Telehealth: Payer: Self-pay

## 2020-10-03 NOTE — Telephone Encounter (Signed)
Ms Ann Riddle [facility] signed) said that she called [facility] this morning for refill on clonazepam 0.5 mg and was advised no refill. Pt should have one refill available.last filled on 08/30/20. I called but [facility] closed for lunch and will reopen at 2 PM. Ms Ann Riddle will ck with pharmacy later today for refill info. I spoke with Vicente Males pharmacist and when she tries to run [employer] will not pay until after 10/23/20. Vicente Males said it does not give specific answer why but wonders if pt may have gotten another clonazepam refill somewhere else and suggest that her daughter  Call the ins co to see why not filling. Ms Ann Riddle notified and voiced understanding and she will contact the ins co. Nothing further needed at this time.

## 2020-10-08 ENCOUNTER — Other Ambulatory Visit: Payer: Medicare Other | Admitting: Hospice

## 2020-10-10 ENCOUNTER — Other Ambulatory Visit: Payer: Self-pay

## 2020-10-10 MED ORDER — COLESEVELAM HCL 625 MG PO TABS: 625.0000 mg | ORAL_TABLET | Freq: Every day | ORAL | 1 refills | Status: DC

## 2020-10-12 ENCOUNTER — Other Ambulatory Visit: Payer: Self-pay | Admitting: Family Medicine

## 2020-10-12 DIAGNOSIS — K219 Gastro-esophageal reflux disease without esophagitis: Secondary | ICD-10-CM

## 2020-10-16 ENCOUNTER — Telehealth: Payer: Self-pay

## 2020-10-16 DIAGNOSIS — F01518 Vascular dementia, unspecified severity, with other behavioral disturbance: Secondary | ICD-10-CM

## 2020-10-16 MED ORDER — QUETIAPINE FUMARATE 25 MG PO TABS: 25.0000 mg | ORAL_TABLET | Freq: Every day | ORAL | 1 refills | Status: DC

## 2020-10-16 NOTE — Telephone Encounter (Signed)
Acquinita, patient's daughter, called asking Dr Einar Pheasant to resend Seroquel RX with directions of 1 tablet daily instead of 1/2 tablet. Acquinita states Dr Einar Pheasant advised patient to try 1/2 tablet at first and if it did not work ok to increase to 1 tablet daily. 1 tablet daily is working better but they can not refill her current RX at the pharmacy due to the directions. Please review.  I went ahead and pulled down the directions for the RX unless this is not ok. Thank you

## 2020-10-16 NOTE — Telephone Encounter (Signed)
Daughter advised.

## 2020-10-16 NOTE — Telephone Encounter (Signed)
Refill sent to pharmacy.   Will check in next week to see if additional dose changes are needed

## 2020-10-23 ENCOUNTER — Ambulatory Visit: Payer: Medicare Other | Admitting: Family Medicine

## 2020-10-24 ENCOUNTER — Other Ambulatory Visit: Payer: Medicare Other | Admitting: Hospice

## 2020-10-24 ENCOUNTER — Other Ambulatory Visit: Payer: Self-pay

## 2020-10-24 DIAGNOSIS — Z515 Encounter for palliative care: Secondary | ICD-10-CM

## 2020-10-24 DIAGNOSIS — R1013 Epigastric pain: Secondary | ICD-10-CM

## 2020-10-24 DIAGNOSIS — F03911 Unspecified dementia, unspecified severity, with agitation: Secondary | ICD-10-CM

## 2020-10-24 DIAGNOSIS — F0391 Unspecified dementia with behavioral disturbance: Secondary | ICD-10-CM

## 2020-10-24 NOTE — Progress Notes (Signed)
[facility] Consult Note Telephone: [phone]  Fax: [phone]  PATIENT NAME: Ann Riddle DOB: 12-Oct-1921 MRN: [id number]  PRIMARY CARE PROVIDER:   Lesleigh Noe, MD Lesleigh Noe, [place],  [place] [address]  REFERRING PROVIDER: Lesleigh Noe, MD Lesleigh Noe, [place],  [place] [address]  RESPONSIBLE PARTY:   Contact Information     Name Relation Home Work Mobile   [place] Daughter [phone]     [place] Constable   Sarah Ann Daughter   [phone]   sellers, Juel Burrow   [phone]       Visit is to build trust and highlight Palliative Medicine as specialized medical care for people living with serious illness, aimed at facilitating better quality of life through symptoms relief, assisting with advance care planning and complex medical decision making. [name] is home with patient during visit. This is a follow up visit.  RECOMMENDATIONS/PLAN:   Advance Care Planning/Code Status:Patient is a Do Not Resuscitate  Goals of Care: Goals of care include to maximize quality of life and symptom management.  Visit consisted of counseling and education dealing with the complex and emotionally intense issues of symptom management and palliative care in the setting of serious and potentially life-threatening illness. Palliative care team will continue to support patient, patient's family, and medical team.  Symptom management/Plan:  Dementia: Daughter reports patient is sometimes paranoid/distrustful, agitated Encouraged supportive care, distraction, familiar environment. Safety precautions. Education on the progressive nature of Dementia.[name] reports patient wandered out of her home into the neighourhood but was later found. Education reiterated on safety precaution, surveillance to avoid wandering outside home. Recommendation: Take  Seroquel 25 mg BID for behavior of agitation. Plan is in progress for SNF placement, per [name]. FAST 6C ambulatory with a cane. FLACC 0.  Psych consult as needed. Dyspepsia likely related to hx of GERD: Protonix 40mg  BID daily before meals. Pepcid - Famotidine 20mg  at bedtime. Education on  reflux precautions, eat food sitting up and up for at least 30 mins- 1 hour after food. Avoid spicy/fried foods or any food that triggers the heart burn. Insomnia/Poor appetite:  Mirtazapine as ordered. Follow up: Palliative care will continue to follow for complex medical decision making, advance care planning, and clarification of goals. Return 6 weeks or prn. Encouraged to call provider sooner with any concerns.  CHIEF COMPLAINT: Palliative follow up  HISTORY OF PRESENT ILLNESS:  Ann Riddle a 87 y.o. female with multiple medical problems including Dementia with behavior- wandering, GERD, Insomnia.  Patient denied pain/discomfort, denied wandering and agitation.  History obtained from review of EMR, discussion with primary team, family and/or patient. Records reviewed and summarized above. All 10 point systems reviewed and are negative except as documented in history of present illness above  Review and summarization of Epic records shows history from other than patient.   Palliative Care was asked to follow this patient o help address complex decision making in the context of advance care planning and goals of care clarification.   PHYSICAL EXAM  General: In no acute distress, appropriately dressed Cardiovascular: regular rate and rhythm; no edema in BLE Pulmonary: no cough, no increased work of breathing, normal respiratory effort Abdomen: soft, non tender, no guarding, positive bowel sounds in all quadrants GU:  no suprapubic tenderness Eyes: Normal lids, no discharge ENMT: Moist mucous membranes Musculoskeletal:  weakness, ambulatory with a cane Skin: no  rash to visible skin, warm without  cyanosis,  Psych: non-anxious affect, mildly restless Neurological: Weakness but otherwise non focal Heme/lymph/immuno: no bruises, no bleeding  PERTINENT MEDICATIONS:  Outpatient Encounter Medications as of 10/24/2020  Medication Sig   acetaminophen (TYLENOL) 650 MG CR tablet Take 650 mg by mouth daily as needed (headache).   aspirin EC 81 MG tablet Take 81 mg by mouth daily.   clonazePAM (KLONOPIN) 0.5 MG tablet TAKE 1 TABLET (0.5 MG TOTAL) BY MOUTH AT BEDTIME AS NEEDED FOR ANXIETY (SLEEP).   colesevelam (WELCHOL) 625 MG tablet Take 1 tablet (625 mg total) by mouth daily.   ENSURE (ENSURE) Take 237 mLs by mouth See admin instructions. Drink one can (237 mls) by mouth once or twice daily   famotidine (PEPCID) 20 MG tablet Take 1 tablet (20 mg total) by mouth 2 (two) times daily.   hyoscyamine (LEVSIN SL) 0.125 MG SL tablet Place 1 tablet (0.125 mg total) under the tongue every 8 (eight) hours as needed (chest pains/acid reflux).   LINZESS 290 MCG CAPS capsule TAKE 1 CAPSULE BY MOUTH DAILY 30 MINUTES BEFORE BREAKFAST FOR CONSTIPATION. HOLD FOR LIQUID STOOLS   mirtazapine (REMERON) 15 MG tablet TAKE 1 TABLET BY MOUTH EVERYDAY AT BEDTIME   Multiple Vitamin (MULTIVITAMIN WITH MINERALS) TABS tablet Take 1 tablet by mouth daily.   nitroGLYCERIN (NITROSTAT) 0.4 MG SL tablet Place 1 tablet (0.4 mg total) under the tongue every 5 (five) minutes as needed for chest pain.   olmesartan (BENICAR) 40 MG tablet Take 0.5 tablets (20 mg total) by mouth daily.   pantoprazole (PROTONIX) 40 MG tablet Take 1 tablet (40 mg total) by mouth 2 (two) times daily before a meal.   polyethylene glycol (MIRALAX / GLYCOLAX) packet Take 17 g by mouth daily.    QUEtiapine (SEROQUEL) 25 MG tablet Take 1 tablet (25 mg total) by mouth at bedtime.   No facility-administered encounter medications on file as of 10/24/2020.    HOSPICE ELIGIBILITY/DIAGNOSIS: TBD  PAST MEDICAL HISTORY:  Past Medical History:  Diagnosis Date    Dementia ([place])    Diaphragmatic hernia without mention of obstruction or gangrene    Diverticulosis of colon (without mention of hemorrhage)    Esophageal reflux    Irritable bowel syndrome    Other and unspecified hyperlipidemia    Paranoia ([facility])    Psychosis ([facility])    Unspecified essential hypertension    Ventral hernia, unspecified, without mention of obstruction or gangrene     ALLERGIES: No Known Allergies    I spent 60 minutes providing this consultation; this includes time spent with patient/family, chart review and documentation. More than 50% of the time in this consultation was spent on counseling and coordinating communication   Thank you for the opportunity to participate in the care of Ann Riddle Please call our office at [phone] if we can be of additional assistance.  Note: Portions of this note were generated with [employer]. Dictation errors may occur despite best attempts at proofreading.  Teodoro Spray, NP

## 2020-10-25 ENCOUNTER — Telehealth: Payer: Self-pay | Admitting: Family Medicine

## 2020-10-25 NOTE — Telephone Encounter (Signed)
ERROR

## 2020-10-29 ENCOUNTER — Ambulatory Visit: Payer: Medicare Other | Admitting: Nurse Practitioner

## 2020-10-29 ENCOUNTER — Other Ambulatory Visit (INDEPENDENT_AMBULATORY_CARE_PROVIDER_SITE_OTHER): Payer: Medicare Other

## 2020-10-29 VITALS — BP 134/70 | HR 83 | Ht 62.0 in | Wt 119.0 lb

## 2020-10-29 DIAGNOSIS — K219 Gastro-esophageal reflux disease without esophagitis: Secondary | ICD-10-CM

## 2020-10-29 DIAGNOSIS — R1013 Epigastric pain: Secondary | ICD-10-CM

## 2020-10-29 LAB — COMPREHENSIVE METABOLIC PANEL
ALT: 9 U/L (ref 0–35)
AST: 17 U/L (ref 0–37)
Albumin: 4 g/dL (ref 3.5–5.2)
Alkaline Phosphatase: 67 U/L (ref 39–117)
BUN: 19 mg/dL (ref 6–23)
CO2: 30 mEq/L (ref 19–32)
Calcium: 9.3 mg/dL (ref 8.4–10.5)
Chloride: 102 mEq/L (ref 96–112)
Creatinine, Ser: 0.95 mg/dL (ref 0.40–1.20)
GFR: 50.4 mL/min — ABNORMAL LOW (ref >60.00)
Glucose, Bld: 82 mg/dL (ref 70–99)
Potassium: 4.4 mEq/L (ref 3.5–5.1)
Sodium: 137 mEq/L (ref 135–145)
Total Bilirubin: 0.5 mg/dL (ref 0.2–1.2)
Total Protein: 6.9 g/dL (ref 6.0–8.3)

## 2020-10-29 LAB — CBC WITH DIFFERENTIAL/PLATELET
Basophils Absolute: 0 10*3/uL (ref 0.0–0.1)
Basophils Relative: 0.8 % (ref 0.0–3.0)
Eosinophils Absolute: 0 10*3/uL (ref 0.0–0.7)
Eosinophils Relative: 1 % (ref 0.0–5.0)
HCT: 36.1 % (ref 36.0–46.0)
Hemoglobin: 12.2 g/dL (ref 12.0–15.0)
Lymphocytes Relative: 20.7 % (ref 12.0–46.0)
Lymphs Abs: 0.7 10*3/uL (ref 0.7–4.0)
MCHC: 33.8 g/dL (ref 30.0–36.0)
MCV: 88.2 fl (ref 78.0–100.0)
Monocytes Absolute: 0.3 10*3/uL (ref 0.1–1.0)
Monocytes Relative: 8.5 % (ref 3.0–12.0)
Neutro Abs: 2.3 10*3/uL (ref 1.4–7.7)
Neutrophils Relative %: 69 % (ref 43.0–77.0)
Platelets: 218 10*3/uL (ref 150.0–400.0)
RBC: 4.09 Mil/uL (ref 3.87–5.11)
RDW: 13.7 % (ref 11.5–15.5)
WBC: 3.3 10*3/uL — ABNORMAL LOW (ref 4.0–10.5)

## 2020-10-29 NOTE — Progress Notes (Signed)
10/29/2020 Ann Riddle [id number] 12/07/1921   Chief Complaint: abdominal pain  Hisory of Present Illness: Ann Riddle is a 87 year old female with a past medical history of hypertension, dementia, paranoia, GERD, colon polyps and chronic abdominal pain. Past cholecystectomy. She is followed by Dr. Ardis Hughs. She presents today accompanied by her daughter for further evaluation regarding upper abdominal pain and GERD. Obtaining her symptoms history is challenging as she has difficulty describing how often she has abdominal pain and is unsure of any obvious triggers. Her daughter stated she has dementia therefore she is forgetful and she has paranoia, sometimes afraid to eat foods as she worries are toxic or poison like. Her upper abdominal pain sometimes occurs after she eats. She has intermittent heartburn with chest discomfort. She is taking Pantoprazole 40mg  po bid and Famotidine 20mg  po bid. She takes ASA 81mg  daily. No other NSAIDS. She underwent an EGD 06/04/2009 which showed mild gastritis and duodenitis. No evidence of H. Pylori. She has lost 4 lbs over the past 6 weeks. No fevers. She is passing a normal BM every other day on Linzess 290 mcg one tab daily. Sometimes takes Miralax. Her most recent colonoscopy was done on 01/11/2008 which showed internal hemorrhoids, no polyps. No further colonoscopies recommended due to her age. No known family history of colorectal cancer.    Current Outpatient Medications on File Prior to Visit  Medication Sig Dispense Refill   acetaminophen (TYLENOL) 650 MG CR tablet Take 650 mg by mouth daily as needed (headache).     aspirin EC 81 MG tablet Take 81 mg by mouth daily.     clonazePAM (KLONOPIN) 0.5 MG tablet TAKE 1 TABLET (0.5 MG TOTAL) BY MOUTH AT BEDTIME AS NEEDED FOR ANXIETY (SLEEP). 30 tablet 1   colesevelam (WELCHOL) 625 MG tablet Take 1 tablet (625 mg total) by mouth daily. 90 tablet 1   ENSURE (ENSURE) Take 237 mLs by mouth See admin  instructions. Drink one can (237 mls) by mouth once or twice daily     famotidine (PEPCID) 20 MG tablet Take 1 tablet (20 mg total) by mouth 2 (two) times daily. 60 tablet 3   hyoscyamine (LEVSIN SL) 0.125 MG SL tablet Place 1 tablet (0.125 mg total) under the tongue every 8 (eight) hours as needed (chest pains/acid reflux).     LINZESS 290 MCG CAPS capsule TAKE 1 CAPSULE BY MOUTH DAILY 30 MINUTES BEFORE BREAKFAST FOR CONSTIPATION. HOLD FOR LIQUID STOOLS 30 capsule 5   Multiple Vitamin (MULTIVITAMIN WITH MINERALS) TABS tablet Take 1 tablet by mouth daily.     nitroGLYCERIN (NITROSTAT) 0.4 MG SL tablet Place 1 tablet (0.4 mg total) under the tongue every 5 (five) minutes as needed for chest pain. 20 tablet 12   olmesartan (BENICAR) 40 MG tablet Take 0.5 tablets (20 mg total) by mouth daily. 90 tablet 3   pantoprazole (PROTONIX) 40 MG tablet Take 1 tablet (40 mg total) by mouth 2 (two) times daily before a meal. 180 tablet 3   polyethylene glycol (MIRALAX / GLYCOLAX) packet Take 17 g by mouth daily.      QUEtiapine (SEROQUEL) 25 MG tablet Take 1 tablet (25 mg total) by mouth at bedtime. 30 tablet 1   No current facility-administered medications on file prior to visit.    No Known Allergies   Current Medications, Allergies, Past Medical History, Past Surgical History, Family History and Social History were reviewed in [employer] record.   Review  of Systems:   Constitutional: Negative for fever, sweats, chills or weight loss.  Respiratory: Negative for shortness of breath.   Cardiovascular: Negative for chest pain, palpitations and leg swelling.  Gastrointestinal: See HPI.  Musculoskeletal: Negative for back pain or muscle aches.  Neurological: Negative for dizziness, headaches or paresthesias.    Physical Exam: BP 134/70   Pulse 83   Ht 5\' 2"  (1.575 m)   Wt 119 lb (54 kg)   SpO2 99%   BMI 21.77 kg/m   Wt Readings from Last 3 Encounters:  10/29/20 119 lb (54  kg)  09/20/20 122 lb 4 oz (55.5 kg)  09/04/20 123 lb 8 oz (56 kg)    General: 87 year old female mildly HOH in NAD.  Head: Normocephalic and atraumatic. Eyes: No scleral icterus. Conjunctiva pink . Ears: Normal auditory acuity. Mouth: No ulcers or lesions.  Lungs: Clear throughout to auscultation. Heart: Regular rate and rhythm, no murmur. Abdomen: Soft, nontender and nondistended. No masses or hepatomegaly. Normal bowel sounds x 4 quadrants.  Rectal: Deferred.  Musculoskeletal: Symmetrical with no gross deformities. Extremities: No edema. Neurological: Alert oriented x 4. No focal deficits.  Psychological: Alert and cooperative. Normal mood and affect  Assessment and Recommendations:  62. 87 year old female with GERD and chronic upper abdominal pain. -Try Gaviscon 1 tbsp tid as needed for heartburn/upper abdominal pain -FDgard 2 caps po bid -Ensure as tolerated  -Continue Pantoprazole and Famotidine as previously prescribed for now, consider reducing Pantoprazole to QD -CBC, CMP -Discussed scheduling an abd/pelvic CT scan with oral contrast only if abdominal pain worsens -No plans for endoscopic evaluation, conservative management for 87 year old female  -Daughter to call our office if symptoms worsen -Follow up PRN  2. Chest pain: GERD  vs cardiac etiology. -Follow up with PCP for EKG  -To ER if significant chest pain occurs   3. Chronic constipation  -Continue Linzess -Miralax PRN  4. Dementia

## 2020-10-29 NOTE — Patient Instructions (Addendum)
If you are age 85 or older, your body mass index should be between 23-30. Your Body mass index is 21.77 kg/m. If this is out of the aforementioned range listed, please consider follow up with your Primary Care Provider.  LABS:  Lab work has been ordered for you today. Our lab is located in the basement. Press "B" on the elevator. The lab is located at the first door on the left as you exit the elevator.  HEALTHCARE LAWS AND MY CHART RESULTS: Due to recent changes in healthcare laws, you may see the results of your imaging and laboratory studies on MyChart before your provider has had a chance to review them.   We understand that in some cases there may be results that are confusing or concerning to you. Not all laboratory results come back in the same time frame and the provider may be waiting for multiple results in order to interpret others.  Please give [place] 48 hours in order for your provider to thoroughly review all the results before contacting the office for clarification of your results.   RECOMMENDATIONS: Gaviscon 1 tablespoon 3 times a day as needed for heartburn. We have given you samples of the following medication to take:  Fdgard two capsules twice a day. Follow up with your primary care doctor regarding the chest pain and anxiety.  Go to the emergency room if the chest pain does return.  It was great seeing you today! Thank you for entrusting me with your care and choosing [facility].  Noralyn Pick, CRNP

## 2020-10-30 ENCOUNTER — Telehealth: Payer: Self-pay

## 2020-10-30 NOTE — Telephone Encounter (Signed)
-----   Message from Noralyn Pick, NP sent at 10/30/2020  5:26 AM EDT ----- Lenna Sciara, pls inform patient or her daughter, labs were stable. Her white blood cell count is low which appears to be a chronic issue and she should follow up with her pcp regarding this results. Thx

## 2020-10-30 NOTE — Telephone Encounter (Signed)
Notified patient's daughter of results note, she expressed understanding and agreement, no further questions at this time.

## 2020-10-30 NOTE — Progress Notes (Signed)
I agree with the above note, plan 

## 2020-11-08 ENCOUNTER — Other Ambulatory Visit: Payer: Self-pay

## 2020-11-08 ENCOUNTER — Ambulatory Visit (INDEPENDENT_AMBULATORY_CARE_PROVIDER_SITE_OTHER): Payer: Medicare Other | Admitting: Family Medicine

## 2020-11-08 VITALS — BP 122/60 | HR 88 | Temp 100.2°F | Ht 62.0 in | Wt 121.0 lb

## 2020-11-08 DIAGNOSIS — F0151 Vascular dementia with behavioral disturbance: Secondary | ICD-10-CM | POA: Diagnosis not present

## 2020-11-08 DIAGNOSIS — R079 Chest pain, unspecified: Secondary | ICD-10-CM | POA: Diagnosis not present

## 2020-11-08 DIAGNOSIS — F01518 Vascular dementia, unspecified severity, with other behavioral disturbance: Secondary | ICD-10-CM

## 2020-11-08 DIAGNOSIS — K219 Gastro-esophageal reflux disease without esophagitis: Secondary | ICD-10-CM

## 2020-11-08 DIAGNOSIS — F411 Generalized anxiety disorder: Secondary | ICD-10-CM

## 2020-11-08 MED ORDER — CITALOPRAM HYDROBROMIDE 10 MG PO TABS
10.0000 mg | ORAL_TABLET | Freq: Every day | ORAL | 3 refills | Status: DC
Start: 2020-11-08 — End: 2021-03-15

## 2020-11-08 MED ORDER — CLONAZEPAM 0.5 MG PO TABS: 0.5000 mg | ORAL_TABLET | Freq: Every evening | ORAL | 1 refills | Status: DC | PRN

## 2020-11-08 NOTE — Assessment & Plan Note (Signed)
No clear benefit from Seroquel. Has neurology follow-up, appreciate their support. #6 on wait list for memory care - paperwork to be sent today.

## 2020-11-08 NOTE — Progress Notes (Signed)
Subjective:     Ann Riddle is a 87 y.o. female presenting for Follow-up (2 month)     HPI  #Chest pain - anxiety often associated - daily symptoms  - burning sensation - daughter notes - relaxing helps it get better  - daughter feels that anxiety triggers this symptoms  #Dementia/paranoid - continuing to have paranoia and not sleeping at night - also with significant anxiety - clonazepam works occasionally   Stomach pain - saw GI  - has several different options  - daughter concerned this could be anxiety related  Review of Systems  09/20/2020: Clinic - dementia worsening - seroquel 12.5>25 mg 10/29/20: GI - continue pantoprazole and famotidine, gaviscon prn - CP recommended EKG with pcp  Social History   Tobacco Use  Smoking Status Never  Smokeless Tobacco Never        Objective:    BP Readings from Last 3 Encounters:  11/08/20 122/60  10/29/20 134/70  09/20/20 (!) 152/78   Wt Readings from Last 3 Encounters:  11/08/20 121 lb (54.9 kg)  10/29/20 119 lb (54 kg)  09/20/20 122 lb 4 oz (55.5 kg)    BP 122/60   Pulse 88   Temp 100.2 F (37.9 C) (Temporal)   Ht 5\' 2"  (1.575 m)   Wt 121 lb (54.9 kg)   SpO2 97%   BMI 22.13 kg/m    Physical Exam Constitutional:      General: She is not in acute distress.    Appearance: She is well-developed. She is not diaphoretic.  HENT:     Right Ear: External ear normal.     Left Ear: External ear normal.     Nose: Nose normal.  Eyes:     Conjunctiva/sclera: Conjunctivae normal.  Cardiovascular:     Rate and Rhythm: Normal rate and regular rhythm.     Heart sounds: Murmur heard.  Pulmonary:     Effort: Pulmonary effort is normal. No respiratory distress.     Breath sounds: Normal breath sounds.  Abdominal:     General: Abdomen is flat.     Palpations: Abdomen is soft.     Tenderness: There is no abdominal tenderness. There is no guarding or rebound.  Musculoskeletal:     Cervical back: Neck supple.   Skin:    General: Skin is warm and dry.     Capillary Refill: Capillary refill takes less than 2 seconds.  Neurological:     Mental Status: She is alert. Mental status is at baseline.  Psychiatric:        Mood and Affect: Mood normal.        Behavior: Behavior normal.      EKG: NSR, no ST changes, no t wave abnormalities    Assessment & Plan:   Problem List Items Addressed This Visit       Digestive   GERD    Reviewed recent GI evaluation. Pt on ppi and h2 blocker but also now with several prn medications - FDGard, hyoscyamine, colesevelam. Discussed keeping diary of symptoms and treatment as well as response to try and see if one works. Has tried FDgard w/o clear benefit. Appreciate GI support         Nervous and Auditory   Dementia with behavioral disturbance ([place]) - Primary    No clear benefit from Seroquel. Has neurology follow-up, appreciate their support. #6 on wait list for memory care - paperwork to be sent today.  Relevant Medications   citalopram (CELEXA) 10 MG tablet   clonazePAM (KLONOPIN) 0.5 MG tablet   Other Relevant Orders   Ambulatory referral to Psychiatry     Other   Chest pain    Recurrent symptoms with etiology unclear. Daughter notes daily and often associated with anxiety but also some burning/reflux concern. Given challenging historian EKG normal w/o signs of ischemia. Suspect anxiety vs reflux       Relevant Orders   EKG 12-Lead (Completed)   Generalized anxiety disorder    Worsening and leading to panic. Likely related to dementia. Previous psych referral declined. Discussed benzos not typically helpful and advised trial of citalopram. Referral to psych placed - geri-psych if available.        Relevant Medications   citalopram (CELEXA) 10 MG tablet   clonazePAM (KLONOPIN) 0.5 MG tablet   Other Relevant Orders   Ambulatory referral to Psychiatry     Return in about 4 weeks (around 12/06/2020) for anxiety - response to  citalopram.  Lesleigh Noe, MD  This visit occurred during the SARS-CoV-2 public health emergency.  Safety protocols were in place, including screening questions prior to the visit, additional usage of staff PPE, and extensive cleaning of exam room while observing appropriate contact time as indicated for disinfecting solutions.

## 2020-11-08 NOTE — Assessment & Plan Note (Addendum)
Recurrent symptoms with etiology unclear. Daughter notes daily and often associated with anxiety but also some burning/reflux concern. Given challenging historian EKG normal w/o signs of ischemia. Suspect anxiety vs reflux

## 2020-11-08 NOTE — Assessment & Plan Note (Signed)
Reviewed recent GI evaluation. Pt on ppi and h2 blocker but also now with several prn medications - FDGard, hyoscyamine, colesevelam. Discussed keeping diary of symptoms and treatment as well as response to try and see if one works. Has tried FDgard w/o clear benefit. Appreciate GI support

## 2020-11-08 NOTE — Assessment & Plan Note (Signed)
Worsening and leading to panic. Likely related to dementia. Previous psych referral declined. Discussed benzos not typically helpful and advised trial of citalopram. Referral to psych placed - geri-psych if available.

## 2020-11-08 NOTE — Patient Instructions (Addendum)
#   Chest pain - Likley either anxiety or acid reflux  # Stomach pain - keep a journal of symptoms and different medications tried as well as responses - bring this to your next visit with the GI doctor  #Anxiety - start Citalopram - referral to psychiatry - see me in 4 weeks unless we are able to get her into psychiatry - also discuss with neurology for ideas - continue clonazepam as needed once daily  #Demential and paranoia - continue quetiapine - discuss response with neurology next week  Back pain - tylenol for pain - heat or ice

## 2020-11-13 ENCOUNTER — Ambulatory Visit: Payer: Medicare Other | Admitting: Diagnostic Neuroimaging

## 2020-11-13 ENCOUNTER — Ambulatory Visit: Payer: Medicare Other | Admitting: Family Medicine

## 2020-11-14 ENCOUNTER — Other Ambulatory Visit: Payer: Self-pay | Admitting: Family Medicine

## 2020-11-14 DIAGNOSIS — F01518 Vascular dementia, unspecified severity, with other behavioral disturbance: Secondary | ICD-10-CM

## 2020-11-14 DIAGNOSIS — F0151 Vascular dementia with behavioral disturbance: Secondary | ICD-10-CM

## 2020-11-17 ENCOUNTER — Other Ambulatory Visit: Payer: Self-pay | Admitting: Family Medicine

## 2020-11-17 DIAGNOSIS — G4700 Insomnia, unspecified: Secondary | ICD-10-CM

## 2020-11-21 ENCOUNTER — Telehealth: Payer: Self-pay

## 2020-11-21 NOTE — Telephone Encounter (Signed)
Aquinittia left v/m requesting hx and CPX to be faxed for pt to [place] home fax # [phone].

## 2020-11-22 NOTE — Telephone Encounter (Signed)
Paperwork faxed to [facility] as requested and Daughter, Aquinittia was notified.

## 2020-11-22 NOTE — Telephone Encounter (Signed)
Appreciate quick response.

## 2020-11-22 NOTE — Telephone Encounter (Signed)
Aquinittia request that paperwork requested be sent ASAP because there is an available bed at nursing home now. Fax # [phone] with attention Monia Pouch. Aquinittia request cb when done. Sending note to Dr Einar [place] and [facility] CMA.

## 2020-11-23 NOTE — Telephone Encounter (Signed)
Aquinittia said that [place] at [facility] had not received the fax with info requested. Alyse Low CMA is not in office today and I looked at Christy's desk and found the transmission log sheet that info was faxed on 11/22/20 to [facility]. The sheet has transmission went thru for # 3 sheets including the cover sheet. I looked at 11/08/20 office notes and was more than 2 pages. I called [facility] [place] x 3 to see if Kenney Houseman was available to verify if did receive fax or what exactly was needed and I did not speak with Tonya but left v/m for her to call our office about the fax. Verda Cumins was advised and said she has appt with [place] on 11/27/20 at 1 pm and if needed could pick up needed info for [place] on 11/27/20 at 12:30 pm. Sending note to [facility] CMA.

## 2020-11-26 ENCOUNTER — Other Ambulatory Visit: Payer: Self-pay | Admitting: Family Medicine

## 2020-11-26 DIAGNOSIS — K219 Gastro-esophageal reflux disease without esophagitis: Secondary | ICD-10-CM

## 2020-11-26 DIAGNOSIS — F0391 Unspecified dementia with behavioral disturbance: Secondary | ICD-10-CM

## 2020-11-26 NOTE — Telephone Encounter (Signed)
Patient's daughter Ann Riddle left a voicemail stating that she is trying to get her mom's health records for the nursing home.  Patient's daughter requested a call back to discuss what she needs.

## 2020-11-26 NOTE — Telephone Encounter (Signed)
Spoke to pt's daughter (DPR) and she let me know what paperwork was needed. I printed it and put up front for pick up, tomorrow.

## 2020-12-01 ENCOUNTER — Other Ambulatory Visit: Payer: Self-pay | Admitting: Family Medicine

## 2020-12-01 DIAGNOSIS — F411 Generalized anxiety disorder: Secondary | ICD-10-CM

## 2020-12-03 ENCOUNTER — Other Ambulatory Visit: Payer: Self-pay | Admitting: Family Medicine

## 2020-12-03 DIAGNOSIS — K219 Gastro-esophageal reflux disease without esophagitis: Secondary | ICD-10-CM

## 2020-12-12 ENCOUNTER — Other Ambulatory Visit: Payer: Medicare Other | Admitting: Hospice

## 2020-12-12 ENCOUNTER — Other Ambulatory Visit: Payer: Self-pay

## 2020-12-12 DIAGNOSIS — Z515 Encounter for palliative care: Secondary | ICD-10-CM

## 2020-12-12 DIAGNOSIS — F22 Delusional disorders: Secondary | ICD-10-CM

## 2020-12-12 DIAGNOSIS — F0391 Unspecified dementia with behavioral disturbance: Secondary | ICD-10-CM

## 2020-12-12 NOTE — Progress Notes (Signed)
[facility] Palliative Care Consult Note Telephone: [phone]  Fax: [phone]  PATIENT NAME: Ann Riddle DOB: February 29, 1922 MRN: [id number]  PRIMARY CARE PROVIDER:   Lesleigh Noe, MD Lesleigh Noe, [place],  [place] [address]  REFERRING PROVIDER: Lesleigh Noe, MD Lesleigh Noe, MD [place],  [place] [address]  RESPONSIBLE PARTY:  [place]     Name Relation Home Work Mobile   [place] Daughter [phone]     [place] Constable   [phone]      TELEHEALTH VISIT STATEMENT Due to the COVID-19 crisis, this visit was done via telemedicine and it was initiated and consent by this patient and or family. Video-audio (telehealth) contact was unable to be done due to technical barriers from the patient's side.  Visit is to build trust and highlight Palliative Medicine as specialized medical care for people living with serious illness, aimed at facilitating better quality of life through symptoms relief, assisting with advance care planning and complex medical decision making. This is a follow up visit.  RECOMMENDATIONS/PLAN:   Advance Care Planning/Code Status: Patient is a DO NOT RESUSCITATE  Goals of Care: Goals of care include to maximize quality of life and symptom management.  Visit consisted of counseling and education dealing with the complex and emotionally intense issues of symptom management and palliative care in the setting of serious and potentially life-threatening illness. Palliative care team will continue to support patient, patient's family, and medical team.  Symptom management/Plan:  Dementia: Memory loss and confusion is worsening in line With dementia disease trajectory.  Daughter reports patient continues with paranoid distrustful behavior, often refusing care and medications because of fear of being hurt or killed.  Patient is currently on Seroquel 25  mg twice daily.  Appointment with neurologist 12/24/2020.  Encouraged de-escalation techniques, distraction, calm approach; encouraged daughter to give Seroquel 50 mg in the morning and 25 mg at nighttime, report effect to neurologist during visit; agitation and paranoia are worse during the day.  Discussion on safety precautions and surveillance as patient is prone to wander outside the home.  Psych consult is recommended.   [name] reports she is awaiting patient's placement at the memory care unit.  Plans are in progress.   Insomnia/Poor appetite:  Mirtazapine as ordered. Anxiety: Managed with Klonopin  Follow up: Palliative care will continue to follow for complex medical decision making, advance care planning, and clarification of goals. Return 6 weeks or prn. Encouraged to call provider sooner with any concerns.  CHIEF COMPLAINT: Palliative follow up  HISTORY OF PRESENT ILLNESS:  Ann Riddle a 87 y.o. female with multiple medical problems including including Dementia with behavior- wandering, GERD, Insomnia.  Patient continues to be paranoid resisting care because of her fear of someone trying to hurt or kill her.  She denied pain/discomfort; denied urinary symptoms, and kept asking what medications she is taking.  How are medications discussed with her ; encouraged her to take her medications as presented to her by her daughter.  History obtained from review of EMR, discussion with primary team, family and/or patient. Records reviewed and summarized above. All 10 point systems reviewed and are negative except as documented in history of present illness above  Review and summarization of Epic records shows history from other than patient.   Palliative Care was asked to follow this patient o help address complex decision making in the context of advance care  planning and goals of care clarification.    PERTINENT MEDICATIONS:  Outpatient Encounter Medications as of 12/12/2020  Medication Sig    acetaminophen (TYLENOL) 650 MG CR tablet Take 650 mg by mouth daily as needed (headache).   aspirin EC 81 MG tablet Take 81 mg by mouth daily.   Caraway Oil-Levomenthol (FDGARD PO) Take 1 each by mouth 2 (two) times daily with a meal.   citalopram (CELEXA) 10 MG tablet Take 1 tablet (10 mg total) by mouth daily.   clonazePAM (KLONOPIN) 0.5 MG tablet Take 1 tablet (0.5 mg total) by mouth at bedtime as needed for anxiety (sleep).   colesevelam (WELCHOL) 625 MG tablet Take 1 tablet (625 mg total) by mouth daily.   ENSURE (ENSURE) Take 237 mLs by mouth See admin instructions. Drink one can (237 mls) by mouth once or twice daily   famotidine (PEPCID) 20 MG tablet TAKE 1 TABLET BY MOUTH TWICE A DAY   hyoscyamine (LEVSIN SL) 0.125 MG SL tablet Place 1 tablet (0.125 mg total) under the tongue every 8 (eight) hours as needed (chest pains/acid reflux).   LINZESS 290 MCG CAPS capsule TAKE 1 CAPSULE BY MOUTH DAILY 30 MINUTES BEFORE BREAKFAST FOR CONSTIPATION. HOLD FOR LIQUID STOOLS   Multiple Vitamin (MULTIVITAMIN WITH MINERALS) TABS tablet Take 1 tablet by mouth daily.   nitroGLYCERIN (NITROSTAT) 0.4 MG SL tablet Place 1 tablet (0.4 mg total) under the tongue every 5 (five) minutes as needed for chest pain.   olmesartan (BENICAR) 40 MG tablet Take 0.5 tablets (20 mg total) by mouth daily.   pantoprazole (PROTONIX) 40 MG tablet Take 1 tablet (40 mg total) by mouth 2 (two) times daily before a meal.   polyethylene glycol (MIRALAX / GLYCOLAX) packet Take 17 g by mouth daily.    QUEtiapine (SEROQUEL) 25 MG tablet TAKE 1 TABLET BY MOUTH EVERYDAY AT BEDTIME   No facility-administered encounter medications on file as of 12/12/2020.    HOSPICE ELIGIBILITY/DIAGNOSIS: TBD  PAST MEDICAL HISTORY:  Past Medical History:  Diagnosis Date   Dementia ([place])    Diaphragmatic hernia without mention of obstruction or gangrene    Diverticulosis of colon (without mention of hemorrhage)    Esophageal reflux    Irritable  bowel syndrome    Other and unspecified hyperlipidemia    Paranoia (HCC)    Psychosis ([place])    Unspecified essential hypertension    Ventral hernia, unspecified, without mention of obstruction or gangrene      Review lab tests/diagnostics Results for Ann Riddle, Ann Riddle (MRN [id number]) as of 12/12/2020 14:57  Ref. Range 10/29/2020 15:42  COMPREHENSIVE METABOLIC PANEL Unknown Rpt (A)  Sodium Latest Ref Range: 135 - 145 mEq/L 137  Potassium Latest Ref Range: 3.5 - 5.1 mEq/L 4.4  Chloride Latest Ref Range: 96 - 112 mEq/L 102  CO2 Latest Ref Range: 19 - 32 mEq/L 30  Glucose Latest Ref Range: 70 - 99 mg/dL 82  BUN Latest Ref Range: 6 - 23 mg/dL 19  Creatinine Latest Ref Range: 0.40 - 1.20 mg/dL 0.95  Calcium Latest Ref Range: 8.4 - 10.5 mg/dL 9.3  Alkaline Phosphatase Latest Ref Range: 39 - 117 U/L 67  Albumin Latest Ref Range: 3.5 - 5.2 g/dL 4.0  AST Latest Ref Range: 0 - 37 U/L 17  ALT Latest Ref Range: 0 - 35 U/L 9  Total Protein Latest Ref Range: 6.0 - 8.3 g/dL 6.9  Total Bilirubin Latest Ref Range: 0.2 - 1.2 mg/dL 0.5  GFR Latest Ref Range: >  60.00 mL/min 50.40 (L)  WBC Latest Ref Range: 4.0 - 10.5 K/uL 3.3 (L)  RBC Latest Ref Range: 3.87 - 5.11 Mil/uL 4.09  Hemoglobin Latest Ref Range: 12.0 - 15.0 g/dL 12.2  HCT Latest Ref Range: 36.0 - 46.0 % 36.1  MCV Latest Ref Range: 78.0 - 100.0 fl 88.2  MCHC Latest Ref Range: 30.0 - 36.0 g/dL 33.8  RDW Latest Ref Range: 11.5 - 15.5 % 13.7  Platelets Latest Ref Range: 150.0 - 400.0 K/uL 218.0  Results for Ann Riddle, Ann Riddle (MRN [id number]) as of 12/12/2020 14:57  Ref. Range 10/29/2020 15:42  Sodium Latest Ref Range: 135 - 145 mEq/L 137  Potassium Latest Ref Range: 3.5 - 5.1 mEq/L 4.4  Chloride Latest Ref Range: 96 - 112 mEq/L 102  CO2 Latest Ref Range: 19 - 32 mEq/L 30  Glucose Latest Ref Range: 70 - 99 mg/dL 82  BUN Latest Ref Range: 6 - 23 mg/dL 19  Creatinine Latest Ref Range: 0.40 - 1.20 mg/dL 0.95  Calcium Latest Ref Range: 8.4 - 10.5 mg/dL  9.3    ALLERGIES: No Known Allergies .  I spent 40 minutes providing this consultation; this includes time spent with patient/family, chart review and documentation. More than 50% of the time in this consultation was spent on counseling and coordinating communication   Thank you for the opportunity to participate in the care of Ann Riddle Please call our office at [phone] if we can be of additional assistance.  Note: Portions of this note were generated with Lobbyist. Dictation errors may occur despite best attempts at proofreading.  Teodoro Spray, NP

## 2020-12-24 ENCOUNTER — Encounter: Payer: Self-pay | Admitting: Diagnostic Neuroimaging

## 2020-12-24 ENCOUNTER — Other Ambulatory Visit: Payer: Self-pay

## 2020-12-24 ENCOUNTER — Ambulatory Visit: Payer: Medicare Other | Admitting: Diagnostic Neuroimaging

## 2020-12-24 VITALS — BP 126/70 | HR 97 | Ht 62.0 in | Wt 119.0 lb

## 2020-12-24 DIAGNOSIS — F0391 Unspecified dementia with behavioral disturbance: Secondary | ICD-10-CM

## 2020-12-24 DIAGNOSIS — F03B18 Unspecified dementia, moderate, with other behavioral disturbance: Secondary | ICD-10-CM

## 2020-12-24 NOTE — Patient Instructions (Addendum)
MODERATE DEMENTIA WITH BEHAVIORAL DISTURBANCE (paranoia and hallucinations) - more paranoia and agitation --> increase quetiapine (seroquel) to '25mg'$  twice a day; then up to '50mg'$  twice a day

## 2020-12-24 NOTE — Progress Notes (Signed)
[facility]  PATIENT: Ann Riddle DOB: 19-May-1921  REFERRING CLINICIAN: Lesleigh Noe, MD HISTORY FROM: patient and daughter  REASON FOR VISIT: follow up   HISTORICAL  CHIEF COMPLAINT:  Chief Complaint  Patient presents with   Follow-up    Rm 7 with daughter- Here for f/u on memory. Reports sx have worsened.     HISTORY OF PRESENT ILLNESS:   UPDATE (12/24/20, [facility]): Since last visit, more paranoia, agitation, fear of being hurt or killed. Living with daughter. Memory declining. More diff to live at home.   PRIOR HPI: 87 year old female here for evaluation of dementia.  For past 5 years patient has had gradual onset progressive short-term memory loss confusion.  In the past 4 to 6 months patient has had increasing hallucinations and paranoia.  She has had several emergency room visits for psychosis.  She was diagnosed with probable dementia and evaluated by psychiatry.  She has been living with her daughter for the past 4 months.  She is argumentative and accusatory towards her daughter.  She has poor insight.  She has been on Lexapro and Klonopin without benefit.  She was recommended to start Zyprexa in the hospital by psychiatry but this was not prescribed or started.   REVIEW OF SYSTEMS: Full 14 system review of systems performed and negative with exception of: As per HPI.  ALLERGIES: No Known Allergies  HOME MEDICATIONS: Outpatient Medications Prior to Visit  Medication Sig Dispense Refill   acetaminophen (TYLENOL) 650 MG CR tablet Take 650 mg by mouth daily as needed (headache).     aspirin EC 81 MG tablet Take 81 mg by mouth daily.     Caraway Oil-Levomenthol (FDGARD PO) Take 1 each by mouth 2 (two) times daily with a meal.     citalopram (CELEXA) 10 MG tablet Take 1 tablet (10 mg total) by mouth daily. 30 tablet 3   clonazePAM (KLONOPIN) 0.5 MG tablet Take 1 tablet (0.5 mg total) by mouth at bedtime as needed for anxiety (sleep). 30 tablet 1    colesevelam (WELCHOL) 625 MG tablet Take 1 tablet (625 mg total) by mouth daily. 90 tablet 1   ENSURE (ENSURE) Take 237 mLs by mouth See admin instructions. Drink one can (237 mls) by mouth once or twice daily     famotidine (PEPCID) 20 MG tablet TAKE 1 TABLET BY MOUTH TWICE A DAY 180 tablet 1   hyoscyamine (LEVSIN SL) 0.125 MG SL tablet Place 1 tablet (0.125 mg total) under the tongue every 8 (eight) hours as needed (chest pains/acid reflux).     LINZESS 290 MCG CAPS capsule TAKE 1 CAPSULE BY MOUTH DAILY 30 MINUTES BEFORE BREAKFAST FOR CONSTIPATION. HOLD FOR LIQUID STOOLS 30 capsule 5   Multiple Vitamin (MULTIVITAMIN WITH MINERALS) TABS tablet Take 1 tablet by mouth daily.     nitroGLYCERIN (NITROSTAT) 0.4 MG SL tablet Place 1 tablet (0.4 mg total) under the tongue every 5 (five) minutes as needed for chest pain. 20 tablet 12   olmesartan (BENICAR) 40 MG tablet Take 0.5 tablets (20 mg total) by mouth daily. 90 tablet 3   pantoprazole (PROTONIX) 40 MG tablet Take 1 tablet (40 mg total) by mouth 2 (two) times daily before a meal. 180 tablet 3   polyethylene glycol (MIRALAX / GLYCOLAX) packet Take 17 g by mouth daily.      QUEtiapine (SEROQUEL) 25 MG tablet TAKE 1 TABLET BY MOUTH EVERYDAY AT BEDTIME 30 tablet 1   No facility-administered medications prior  to visit.    PAST MEDICAL HISTORY: Past Medical History:  Diagnosis Date   Dementia ([place])    Diaphragmatic hernia without mention of obstruction or gangrene    Diverticulosis of colon (without mention of hemorrhage)    Esophageal reflux    Irritable bowel syndrome    Other and unspecified hyperlipidemia    Paranoia ([place])    Psychosis ([place])    Unspecified essential hypertension    Ventral hernia, unspecified, without mention of obstruction or gangrene     PAST SURGICAL HISTORY: Past Surgical History:  Procedure Laterality Date   CHOLECYSTECTOMY     CORONARY ANGIOGRAPHY N/A 06/01/2018   Procedure: CORONARY ANGIOGRAPHY;  Surgeon:  Sherren Mocha, MD;  Location: [facility];  Service: Cardiovascular;  Laterality: N/A;   HIATAL HERNIA REPAIR      FAMILY HISTORY: Family History  Problem Relation Age of Onset   Breast cancer Daughter 54   Colon cancer Neg Hx     SOCIAL HISTORY: Social History   Socioeconomic History   Marital status: Widowed    Spouse name: Not on file   Number of children: 4   Years of education: Not on file   Highest education level: High school graduate  Occupational History   Occupation: retired    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  Tobacco Use   Smoking status: Never   Smokeless tobacco: Never  Vaping Use   Vaping Use: Never used  Substance and Sexual Activity   Alcohol use: No   Drug use: No   Sexual activity: Not on file  Other Topics Concern   Not on file  Social History Narrative   01/30/20   From: [place], [place] - but moved from [place]: Living with Daughter    Work: Retired - Armed forces logistics/support/administrative officer      04/04/20 Family: lives with daughter - Aquinitta, and has 2 other living daughters      Enjoys: not sure      Exercise: tries to walk as much as she can   Diet: good Firefighter belts: Yes    Guns: Yes  and secure   Safe in relationships: Yes    Social Determinants of Radio broadcast assistant Strain: Not on file  Food Insecurity: Not on file  Transportation Needs: Not on file  Physical Activity: Not on file  Stress: Not on file  Social Connections: Not on file  Intimate Partner Violence: Not on file     PHYSICAL EXAM  GENERAL EXAM/CONSTITUTIONAL: Vitals:  Vitals:   12/24/20 1515  BP: 126/70  Pulse: 97  SpO2: 97%  Weight: 119 lb (54 kg)  Height: '5\' 2"'$  (1.575 m)   Body mass index is 21.77 kg/m. Wt Readings from Last 3 Encounters:  12/24/20 119 lb (54 kg)  11/08/20 121 lb (54.9 kg)  10/29/20 119 lb (54 kg)   Patient is in no distress; well developed, nourished and groomed; neck is supple  CARDIOVASCULAR: Examination  of carotid arteries is normal; no carotid bruits Regular rate and rhythm, no murmurs Examination of peripheral vascular system by observation and palpation is normal  EYES: Ophthalmoscopic exam of optic discs and posterior segments is normal; no papilledema or hemorrhages No results found.  MUSCULOSKELETAL: Gait, strength, tone, movements noted in Neurologic exam below  NEUROLOGIC: MENTAL STATUS:  MMSE - Mini Mental State Exam 12/24/2020 04/04/2020  Orientation to time 3 2  Orientation to Place 4 3  Registration  3 2  Attention/ Calculation 0 0  Recall 2 1  Language- name 2 objects 2 2  Language- repeat 1 0  Language- follow 3 step command 3 3  Language- read & follow direction 1 1  Write a sentence 1 0  Copy design 0 0  Total score 20 14   awake, alert, oriented to person [facility] memory  DECR attention and concentration language fluent, comprehension intact, naming intact fund of knowledge LIMITED  POOR INSIGHT  CRANIAL NERVE:  2nd - no papilledema on fundoscopic exam 2nd, 3rd, 4th, 6th - pupils equal and reactive to light, visual fields full to confrontation, extraocular muscles intact, no nystagmus 5th - facial sensation symmetric 7th - facial strength symmetric 8th - hearing intact 9th - palate elevates symmetrically, uvula midline 11th - shoulder shrug symmetric 12th - tongue protrusion midline  MOTOR:  normal bulk and tone, full strength in the BUE, BLE  SENSORY:  normal and symmetric to light touch, temperature, vibration  COORDINATION:  finger-nose-finger, fine finger movements normal  REFLEXES:  deep tendon reflexes present and symmetric  GAIT/STATION:  narrow based gait     DIAGNOSTIC DATA (LABS, IMAGING, TESTING) - I reviewed patient records, labs, notes, testing and imaging myself where available.  Lab Results  Component Value Date   WBC 3.3 (L) 10/29/2020   HGB 12.2 10/29/2020   HCT 36.1 10/29/2020   MCV 88.2 10/29/2020   PLT 218.0  10/29/2020      Component Value Date/Time   NA 137 10/29/2020 1542   K 4.4 10/29/2020 1542   CL 102 10/29/2020 1542   CO2 30 10/29/2020 1542   GLUCOSE 82 10/29/2020 1542   BUN 19 10/29/2020 1542   CREATININE 0.95 10/29/2020 1542   CALCIUM 9.3 10/29/2020 1542   PROT 6.9 10/29/2020 1542   ALBUMIN 4.0 10/29/2020 1542   AST 17 10/29/2020 1542   ALT 9 10/29/2020 1542   ALKPHOS 67 10/29/2020 1542   BILITOT 0.5 10/29/2020 1542   GFRNONAA >60 04/08/2020 0432   GFRAA 59 (L) 11/23/2019 1811   Lab Results  Component Value Date   CHOL  02/06/2010    187        ATP III CLASSIFICATION:  <200     mg/dL   Desirable  200-239  mg/dL   Borderline High  >=240    mg/dL   High          HDL 62 02/06/2010   LDLCALC (H) 02/06/2010    107        Total Cholesterol/HDL:CHD Risk Coronary Heart Disease Risk Table                     Men   Women  1/2 Average Risk   3.4   3.3  Average Risk       5.0   4.4  2 X Average Risk   9.6   7.1  3 X Average Risk  23.4   11.0        Use the calculated Patient Ratio above and the CHD Risk Table to determine the patient's CHD Risk.        ATP III CLASSIFICATION (LDL):  <100     mg/dL   Optimal  100-129  mg/dL   Near or Above                    Optimal  130-159  mg/dL   Borderline  160-189  mg/dL   High  >  190     mg/dL   Very High   TRIG 92 02/06/2010   CHOLHDL 3.0 02/06/2010   No results found for: HGBA1C Lab Results  Component Value Date   VITAMINB12 648 04/08/2020   Lab Results  Component Value Date   TSH 2.079 04/08/2020    03/23/20 CT head  1. No acute intracranial findings. 2. Chronic microvascular angiopathy and parenchymal volume loss.      ASSESSMENT AND PLAN  87 y.o. year old female here with:  Dx:  1. Moderate dementia with behavioral disturbance (HCC)      PLAN:  MODERATE DEMENTIA WITH BEHAVIORAL DISTURBANCE (paranoia and hallucinations) - more paranoia and agitation --> increase quetiapine to '25mg'$  twice a day; then  up to '50mg'$  twice a day  - continue celexa and klonopin - tried and failed donepezil (not likely to benefit) - safety / supervision issues reviewed - daily physical activity / exercise (at least 15-30 minutes) - eat more plants / vegetables - increase social activities, brain stimulation, games, puzzles, hobbies, crafts, arts, music - caregiver resources provided - no driving; cannot handle medications / finances - follow up with PCP and psychiatry  Return for return to PCP, pending if symptoms worsen or fail to improve.    Penni Bombard, MD [id number], 0000000 PM Certified in Neurology, Neurophysiology and Neuroimaging  [facility] [address] [place],  [address] [phone]

## 2020-12-25 ENCOUNTER — Telehealth: Payer: Self-pay

## 2020-12-25 DIAGNOSIS — F0151 Vascular dementia with behavioral disturbance: Secondary | ICD-10-CM

## 2020-12-25 DIAGNOSIS — F01518 Vascular dementia, unspecified severity, with other behavioral disturbance: Secondary | ICD-10-CM

## 2020-12-25 NOTE — Telephone Encounter (Signed)
Ann Riddle (DPR signed) said pt saw neurologist and med instructions were changed and wants new rx called in for pt. Ann Riddle does not know name of med and is not home and will cb on 12/26/20 with name of med. Nothiong further to e done. Now.

## 2020-12-26 MED ORDER — QUETIAPINE FUMARATE 25 MG PO TABS: 25.0000 mg | ORAL_TABLET | Freq: Two times a day (BID) | ORAL | 1 refills | Status: DC

## 2020-12-26 NOTE — Addendum Note (Signed)
Addended by: Waunita Schooner R on: 12/26/2020 12:50 PM   Modules accepted: Orders

## 2020-12-26 NOTE — Telephone Encounter (Signed)
Refill sent to pharmacy.   

## 2020-12-26 NOTE — Telephone Encounter (Signed)
Patient's daughter left a voicemail stating that the neurologist wants her mom's Quetiapine changed to twice a day instead of once a day. Patient's daughter stated that she needs a new script sent to the pharmacy with the new directions. Pharmacy [facility]

## 2021-01-03 ENCOUNTER — Other Ambulatory Visit: Payer: Self-pay

## 2021-01-03 ENCOUNTER — Encounter (INDEPENDENT_AMBULATORY_CARE_PROVIDER_SITE_OTHER): Payer: Medicare Other | Admitting: Family Medicine

## 2021-01-04 ENCOUNTER — Other Ambulatory Visit: Payer: Self-pay | Admitting: Family Medicine

## 2021-01-04 DIAGNOSIS — F411 Generalized anxiety disorder: Secondary | ICD-10-CM

## 2021-01-04 DIAGNOSIS — F0151 Vascular dementia with behavioral disturbance: Secondary | ICD-10-CM

## 2021-01-04 DIAGNOSIS — F01518 Vascular dementia, unspecified severity, with other behavioral disturbance: Secondary | ICD-10-CM

## 2021-01-09 ENCOUNTER — Other Ambulatory Visit: Payer: Self-pay | Admitting: Primary Care

## 2021-01-09 ENCOUNTER — Other Ambulatory Visit: Payer: Self-pay | Admitting: Family Medicine

## 2021-01-09 DIAGNOSIS — F411 Generalized anxiety disorder: Secondary | ICD-10-CM

## 2021-01-09 MED ORDER — CLONAZEPAM 0.5 MG PO TABS: 0.5000 mg | ORAL_TABLET | Freq: Every evening | ORAL | 0 refills | Status: DC | PRN

## 2021-01-09 NOTE — Telephone Encounter (Signed)
  Encourage patient to contact the pharmacy for refills or they can request refills through [place]:  Please schedule appointment if longer than 1 year  NEXT APPOINTMENT DATE:  MEDICATION: clonazpram   Is the patient out of medication? yes  [facility] [address]  Let patient know to contact pharmacy at the end of the day to make sure medication is ready.  Please notify patient to allow 48-72 hours to process  CLINICAL FILLS OUT ALL BELOW:   LAST REFILL:  QTY:  REFILL DATE:    OTHER COMMENTS:    Okay for refill?  Please advise

## 2021-01-14 NOTE — Progress Notes (Signed)
No appt needed

## 2021-01-15 ENCOUNTER — Ambulatory Visit: Payer: Medicare Other | Admitting: Family Medicine

## 2021-01-21 ENCOUNTER — Other Ambulatory Visit: Payer: Self-pay | Admitting: Family Medicine

## 2021-02-09 ENCOUNTER — Other Ambulatory Visit: Payer: Self-pay | Admitting: Primary Care

## 2021-02-09 DIAGNOSIS — F411 Generalized anxiety disorder: Secondary | ICD-10-CM

## 2021-02-14 ENCOUNTER — Other Ambulatory Visit: Payer: Self-pay

## 2021-02-14 ENCOUNTER — Ambulatory Visit (INDEPENDENT_AMBULATORY_CARE_PROVIDER_SITE_OTHER): Payer: Medicare Other | Admitting: Family Medicine

## 2021-02-14 VITALS — BP 158/70 | HR 90 | Temp 97.0°F | Ht 62.0 in | Wt 123.0 lb

## 2021-02-14 DIAGNOSIS — N898 Other specified noninflammatory disorders of vagina: Secondary | ICD-10-CM

## 2021-02-14 DIAGNOSIS — M546 Pain in thoracic spine: Secondary | ICD-10-CM | POA: Diagnosis not present

## 2021-02-14 DIAGNOSIS — G8929 Other chronic pain: Secondary | ICD-10-CM

## 2021-02-14 NOTE — Assessment & Plan Note (Signed)
Hx limited 2/2 to dementia. Denies regular pain - suspect it may be excess skin causing the "flap." Discussed icy/hot or tylenol prn for pain.

## 2021-02-14 NOTE — Progress Notes (Signed)
   Subjective:     Ann Riddle is a 87 y.o. female presenting for Cyst (On inside of vagina)     HPI  #cyst - x 1 week - no pain - cyst  #Back pain - months - feels like her skin is flapping in the back - no burning - just "flapping in the back"    Review of Systems   Social History   Tobacco Use  Smoking Status Never  Smokeless Tobacco Never        Objective:    BP Readings from Last 3 Encounters:  02/14/21 (!) 158/70  12/24/20 126/70  11/08/20 122/60   Wt Readings from Last 3 Encounters:  02/14/21 123 lb (55.8 kg)  12/24/20 119 lb (54 kg)  11/08/20 121 lb (54.9 kg)    BP (!) 158/70   Pulse 90   Temp (!) 97 F (36.1 C) (Temporal)   Ht 5\' 2"  (1.575 m)   Wt 123 lb (55.8 kg)   SpO2 97%   BMI 22.50 kg/m    Physical Exam Exam conducted with a chaperone present.  Constitutional:      General: She is not in acute distress.    Appearance: She is well-developed. She is not diaphoretic.  HENT:     Right Ear: External ear normal.     Left Ear: External ear normal.  Eyes:     Conjunctiva/sclera: Conjunctivae normal.  Cardiovascular:     Rate and Rhythm: Normal rate.  Pulmonary:     Effort: Pulmonary effort is normal.  Genitourinary:    Comments: Firm, non-fluctuant pea-sized lesion deep in the right labia near the urethra Musculoskeletal:     Cervical back: Neck supple.     Comments: Back Inspection: some redundant skin on the mid back Palpation: no spinous or paraspinous ttp ROM: able to bend w/o pain   Skin:    General: Skin is warm and dry.     Capillary Refill: Capillary refill takes less than 2 seconds.  Neurological:     Mental Status: She is alert. Mental status is at baseline.  Psychiatric:        Mood and Affect: Mood normal.        Behavior: Behavior normal.          Assessment & Plan:   Problem List Items Addressed This Visit       Other   Vaginal mass - Primary    Suspect benign lesion w/o erythema or infection.  Pt notes 1 week of symptoms, however, after chart review - she expressed concerns over "knot" in 02/2020 but unable to exam. Advised GYN f/u which will be helpful. Hx limited 2/2 to dementia      Chronic thoracic back pain    Hx limited 2/2 to dementia. Denies regular pain - suspect it may be excess skin causing the "flap." Discussed icy/hot or tylenol prn for pain.         Return if symptoms worsen or fail to improve.  Lesleigh Noe, MD  This visit occurred during the SARS-CoV-2 public health emergency.  Safety protocols were in place, including screening questions prior to the visit, additional usage of staff PPE, and extensive cleaning of exam room while observing appropriate contact time as indicated for disinfecting solutions.

## 2021-02-14 NOTE — Patient Instructions (Addendum)
#  Cyst - Call GYN for appointment for lesion  #Back - I think this may just be the extra skin in the back is causing the flapping  - icy/hot - tylenol if pain

## 2021-02-14 NOTE — Assessment & Plan Note (Signed)
Suspect benign lesion w/o erythema or infection. Pt notes 1 week of symptoms, however, after chart review - she expressed concerns over "knot" in 02/2020 but unable to exam. Advised GYN f/u which will be helpful. Hx limited 2/2 to dementia

## 2021-02-20 ENCOUNTER — Other Ambulatory Visit: Payer: Self-pay | Admitting: Family Medicine

## 2021-02-20 DIAGNOSIS — K219 Gastro-esophageal reflux disease without esophagitis: Secondary | ICD-10-CM

## 2021-02-25 ENCOUNTER — Other Ambulatory Visit: Payer: Self-pay | Admitting: Family Medicine

## 2021-02-25 DIAGNOSIS — F01518 Vascular dementia, unspecified severity, with other behavioral disturbance: Secondary | ICD-10-CM

## 2021-02-26 NOTE — Telephone Encounter (Signed)
Pt daughter called in to know status of refill .

## 2021-02-27 NOTE — Telephone Encounter (Signed)
Please check with family on what dose of medication she is taking  Per neuro could increase to up to 50 mg bid

## 2021-02-27 NOTE — Telephone Encounter (Signed)
[facility] stated need proscription on RX   Encourage patient to contact the pharmacy for refills or they can request refills through [facility]:  Please schedule appointment if longer than 1 year  NEXT APPOINTMENT DATE:  MEDICATION:QUEtiapine (SEROQUEL) 25 MG tablet  Is the patient out of medication?   PHARMACY: [id number] - [place], [place] - [address]   Let patient know to contact pharmacy at the end of the day to make sure medication is ready.  Please notify patient to allow 48-72 hours to process  CLINICAL FILLS OUT ALL BELOW:   LAST REFILL:  QTY:  REFILL DATE:    OTHER COMMENTS:    Okay for refill?  Please advise

## 2021-03-01 NOTE — Telephone Encounter (Signed)
Spoke to dtr and she said they had not increased yet but to go ahead and send in the 50mg  pill now and she will start giving that BID.

## 2021-03-12 ENCOUNTER — Other Ambulatory Visit: Payer: Self-pay | Admitting: Family Medicine

## 2021-03-12 DIAGNOSIS — F411 Generalized anxiety disorder: Secondary | ICD-10-CM

## 2021-03-16 ENCOUNTER — Other Ambulatory Visit: Payer: Self-pay | Admitting: Gastroenterology

## 2021-03-26 ENCOUNTER — Other Ambulatory Visit: Payer: Self-pay | Admitting: Family Medicine

## 2021-03-26 DIAGNOSIS — F01518 Vascular dementia, unspecified severity, with other behavioral disturbance: Secondary | ICD-10-CM

## 2021-04-20 ENCOUNTER — Other Ambulatory Visit: Payer: Self-pay | Admitting: Family Medicine

## 2021-04-20 DIAGNOSIS — I10 Essential (primary) hypertension: Secondary | ICD-10-CM

## 2021-04-21 ENCOUNTER — Emergency Department (HOSPITAL_COMMUNITY): Payer: Medicare Other

## 2021-04-21 ENCOUNTER — Other Ambulatory Visit: Payer: Self-pay

## 2021-04-21 ENCOUNTER — Emergency Department (HOSPITAL_COMMUNITY)
Admission: EM | Admit: 2021-04-21 | Discharge: 2021-04-21 | Disposition: A | Discharge status: Home / Self Care | Payer: Medicare Other | Attending: Emergency Medicine | Admitting: Emergency Medicine

## 2021-04-21 ENCOUNTER — Encounter (HOSPITAL_COMMUNITY): Payer: Self-pay | Admitting: Emergency Medicine

## 2021-04-21 DIAGNOSIS — F0390 Unspecified dementia without behavioral disturbance: Secondary | ICD-10-CM | POA: Insufficient documentation

## 2021-04-21 DIAGNOSIS — S0181XA Laceration without foreign body of other part of head, initial encounter: Secondary | ICD-10-CM | POA: Diagnosis not present

## 2021-04-21 DIAGNOSIS — R079 Chest pain, unspecified: Secondary | ICD-10-CM | POA: Insufficient documentation

## 2021-04-21 DIAGNOSIS — W01198A Fall on same level from slipping, tripping and stumbling with subsequent striking against other object, initial encounter: Secondary | ICD-10-CM | POA: Diagnosis not present

## 2021-04-21 DIAGNOSIS — S0990XA Unspecified injury of head, initial encounter: Secondary | ICD-10-CM | POA: Diagnosis present

## 2021-04-21 DIAGNOSIS — I10 Essential (primary) hypertension: Secondary | ICD-10-CM | POA: Diagnosis not present

## 2021-04-21 DIAGNOSIS — W19XXXA Unspecified fall, initial encounter: Secondary | ICD-10-CM

## 2021-04-21 LAB — BASIC METABOLIC PANEL
Anion gap: 6 (ref 5–15)
BUN: 18 mg/dL (ref 8–23)
CO2: 28 mmol/L (ref 22–32)
Calcium: 9 mg/dL (ref 8.9–10.3)
Chloride: 104 mmol/L (ref 98–111)
Creatinine, Ser: 1.03 mg/dL — ABNORMAL HIGH (ref 0.44–1.00)
GFR, Estimated: 49 mL/min — ABNORMAL LOW (ref >60)
Glucose, Bld: 134 mg/dL — ABNORMAL HIGH (ref 70–99)
Potassium: 3.9 mmol/L (ref 3.5–5.1)
Sodium: 138 mmol/L (ref 135–145)

## 2021-04-21 LAB — CBC
HCT: 36.2 % (ref 36.0–46.0)
Hemoglobin: 11.8 g/dL — ABNORMAL LOW (ref 12.0–15.0)
MCH: 30.3 pg (ref 26.0–34.0)
MCHC: 32.6 g/dL (ref 30.0–36.0)
MCV: 92.8 fL (ref 80.0–100.0)
Platelets: 217 10*3/uL (ref 150–400)
RBC: 3.9 MIL/uL (ref 3.87–5.11)
RDW: 13.3 % (ref 11.5–15.5)
WBC: 2.8 10*3/uL — ABNORMAL LOW (ref 4.0–10.5)
nRBC: 0 % (ref 0.0–0.2)

## 2021-04-21 LAB — TROPONIN I (HIGH SENSITIVITY)
Troponin I (High Sensitivity): 45 ng/L — ABNORMAL HIGH (ref <18)
Troponin I (High Sensitivity): 49 ng/L — ABNORMAL HIGH (ref <18)

## 2021-04-21 MED ORDER — ACETAMINOPHEN 500 MG PO TABS
1000.0000 mg | ORAL_TABLET | Freq: Once | ORAL | Status: AC
  Administered 2021-04-21: 23:00:00 1000 mg via ORAL
  Filled 2021-04-21: qty 2

## 2021-04-21 MED ORDER — LIDOCAINE-EPINEPHRINE (PF) 2 %-1:200000 IJ SOLN
10.0000 mL | Freq: Once | INTRAMUSCULAR | Status: DC
  Filled 2021-04-21: qty 20

## 2021-04-21 NOTE — ED Triage Notes (Signed)
Pt fell in her bedroom.  Family and pt unsure what caused her to fall and what she hit.  Denies dizziness.  Reports chronic chest pain/ "heartburn".  Deep laceration above bridge of nose.  No blood thinners.  Denies LOC. No back and neck pain.

## 2021-04-21 NOTE — Discharge Instructions (Addendum)
1.  You can give extra strength Tylenol every 6 hours for pain control. 2.  Keep your wound clean with mild soap and water and apply an over-the-counter antibiotic ointment. 3.  Follow-up with your doctor in about 1 week for stitch removal.  Return to the emergency department sooner if you are having new or concerning symptoms.

## 2021-04-21 NOTE — ED Provider Notes (Signed)
Emergency Medicine Provider Triage Evaluation Note  Ann Riddle , a 87 y.o. female  was evaluated in triage.  Pt complains of a laceration to forehead.  Pt states she just fell.    Review of Systems  Positive: Laceration forehead  Negative: no fever   Physical Exam  BP (!) 174/74 (BP Location: Left Arm)    Pulse 72    Temp 98.6 F (37 C) (Oral)    Resp 15    SpO2 100%  Gen:   Awake, no distress   Resp:  Normal effort  MSK:   Moves extremities without difficulty  Other:  1cm laceration forehead gapping   Medical Decision Making  Medically screening exam initiated at 7:16 PM.  Appropriate orders placed.  Ann Riddle was informed that the remainder of the evaluation will be completed by another provider, this initial triage assessment does not replace that evaluation, and the importance of remaining in the ED until their evaluation is complete.     Ann Riddle, [place] 04/21/21 1917    [name], MD 04/24/21 [phone]

## 2021-04-21 NOTE — ED Provider Notes (Signed)
[facility] EMERGENCY DEPARTMENT Provider Note   CSN: [id number] Arrival date & time: 04/21/21  1815     History Chief Complaint  Patient presents with   Chest Pain   Fall   Facial Laceration    Ann Riddle is a 87 y.o. female.  HPI Patient reports that she fell in her room.  She is not sure how she fell.  She thinks she struck her head on the bed.  She got a small cut on her forehead.  Patient's daughter was at home but not in the room with her when she fell.  No known loss of consciousness.  Patient denies she has a headache.  She reports it hurts on her forehead where she has recut.  No neck pain.  No extremity pain.  No chest pain.  Patient reportedly has chronic abdominal discomfort.  She reports that was present before the fall.  Patient not on blood thinners.    Past Medical History:  Diagnosis Date   Dementia ([place])    Diaphragmatic hernia without mention of obstruction or gangrene    Diverticulosis of colon (without mention of hemorrhage)    Esophageal reflux    Irritable bowel syndrome    Other and unspecified hyperlipidemia    Paranoia (HCC)    Psychosis ([place])    Unspecified essential hypertension    Ventral hernia, unspecified, without mention of obstruction or gangrene     Patient Active Problem List   Diagnosis Date Noted   Chronic thoracic back pain 02/14/2021   Left arm pain 07/03/2020   Burning pain 05/29/2020   Bilateral leg numbness 05/29/2020   Dizziness 04/23/2020   Hypoglycemia 04/07/2020   Dementia with behavioral disturbance 04/02/2020   Hallucinations    Atherosclerosis of aorta ([place]) 02/29/2020   Vaginal mass 02/29/2020   Osteopenia 02/22/2020   Insomnia 01/30/2020   Current moderate episode of major depressive disorder without prior episode ([place]) 01/30/2020   Generalized anxiety disorder 01/30/2020   Porokeratosis 10/26/2019   Abnormal nuclear cardiac imaging test    Chest pain 11/13/2015   Constipation 11/10/2007    Hyperlipidemia 08/04/2007   Essential hypertension 08/04/2007   GERD 08/04/2007   IBS 08/04/2007    Past Surgical History:  Procedure Laterality Date   CHOLECYSTECTOMY     CORONARY ANGIOGRAPHY N/A 06/01/2018   Procedure: CORONARY ANGIOGRAPHY;  Surgeon: Sherren Mocha, MD;  Location: [facility];  Service: Cardiovascular;  Laterality: N/A;   HIATAL HERNIA REPAIR       OB History   No obstetric history on file.     Family History  Problem Relation Age of Onset   Breast cancer Daughter 71   Colon cancer Neg Hx     Social History   Tobacco Use   Smoking status: Never   Smokeless tobacco: Never  Vaping Use   Vaping Use: Never used  Substance Use Topics   Alcohol use: No   Drug use: No    Home Medications Prior to Admission medications   Medication Sig Start Date End Date Taking? Authorizing Provider  acetaminophen (TYLENOL) 650 MG CR tablet Take 650 mg by mouth daily as needed (headache).   Yes [provider]  citalopram (CELEXA) 10 MG tablet TAKE 1 TABLET BY MOUTH EVERY DAY 03/15/21  Yes Lesleigh Noe, MD  clonazePAM (KLONOPIN) 0.5 MG tablet TAKE 1 TABLET (0.5 MG TOTAL) BY MOUTH AT BEDTIME AS NEEDED FOR ANXIETY (SLEEP). Patient taking differently: Take 0.5 mg by mouth.  02/11/21  Yes Lesleigh Noe, MD  colesevelam (WELCHOL) 625 MG tablet TAKE 1 TABLET BY MOUTH DAILY. 03/18/21  Yes Milus Banister, MD  ENSURE (ENSURE) Take 237 mLs by mouth daily.   Yes [provider]  famotidine (PEPCID) 20 MG tablet TAKE 1 TABLET BY MOUTH TWICE A DAY 12/06/20  Yes Cody, Jobe Marker, MD  LINZESS 290 MCG CAPS capsule TAKE 1 CAPSULE BY MOUTH DAILY 30 MINUTES BEFORE BREAKFAST FOR CONSTIPATION. HOLD FOR LIQUID STOOLS 01/23/21  Yes Lesleigh Noe, MD  Multiple Vitamin (MULTIVITAMIN WITH MINERALS) TABS tablet Take 1 tablet by mouth daily.   Yes [provider]  nitroGLYCERIN (NITROSTAT) 0.4 MG SL tablet Place 1 tablet (0.4 mg total) under the tongue every 5  (five) minutes as needed for chest pain. 11/14/15  Yes Thurnell Lose, MD  olmesartan (BENICAR) 40 MG tablet Take 0.5 tablets (20 mg total) by mouth daily. 04/23/20  Yes Lesleigh Noe, MD  pantoprazole (PROTONIX) 40 MG tablet Take 1 tablet (40 mg total) by mouth 2 (two) times daily before a meal. 04/02/20  Yes Milus Banister, MD  polyethylene glycol [facility] / GLYCOLAX) packet Take 17 g by mouth daily as needed for moderate constipation.   Yes [provider]  QUEtiapine (SEROQUEL) 50 MG tablet TAKE 1 TABLET BY MOUTH TWICE A DAY 03/27/21  Yes Lesleigh Noe, MD  hyoscyamine (LEVSIN SL) 0.125 MG SL tablet Place 1 tablet (0.125 mg total) under the tongue every 8 (eight) hours as needed (chest pains/acid reflux). Patient not taking: Reported on 04/21/2021 04/08/20   Aline August, MD    Allergies    Patient has no known allergies.  Review of Systems   Review of Systems 10 systems reviewed and negative except as per HPI Physical Exam Updated Vital Signs BP (!) 154/76   Pulse 73   Temp 98.6 F (37 C) (Oral)   Resp 19   SpO2 99%   Physical Exam Constitutional:      Comments: Alert, GCS 15.  No distress.  HENT:     Head:     Comments: 1.5 cm laceration to the forehead right between the eyes of the glabella.  No active bleeding or hematoma    Nose: Nose normal.     Mouth/Throat:     Mouth: Mucous membranes are moist.     Pharynx: Oropharynx is clear.  Eyes:     Extraocular Movements: Extraocular movements intact.  Neck:     Comments: No C-spine tenderness. Cardiovascular:     Rate and Rhythm: Normal rate and regular rhythm.  Pulmonary:     Effort: Pulmonary effort is normal.     Breath sounds: Normal breath sounds.  Chest:     Chest wall: No tenderness.  Abdominal:     Palpations: Abdomen is soft.     Comments: Patient endorses mild discomfort diffusely in the lower abdomen.  No guarding no abdominal abrasions or contusions.  Musculoskeletal:        General:  Normal range of motion.     Comments: No evidence of extremity injury.  Normal range of motion of upper and lower extremities.  Patient and flex at the hip knee and ankle and push against resistance bilateral lower extremities without difficulty.  Examination of the back does not show contusions or abrasions.  1 mildly tender area at about T12.  Not significantly tender.  Skin:    General: Skin is warm and dry.  Neurological:  General: No focal deficit present.     Comments: Patient is alert.  Interactive.  GCS 15.  Answers questions.  Follows commands.  No focal motor deficits.    ED Results / Procedures / Treatments   Labs (all labs ordered are listed, but only abnormal results are displayed) Labs Reviewed  BASIC METABOLIC PANEL - Abnormal; Notable for the following components:      Result Value   Glucose, Bld 134 (*)    Creatinine, Ser 1.03 (*)    GFR, Estimated 49 (*)    All other components within normal limits  CBC - Abnormal; Notable for the following components:   WBC 2.8 (*)    Hemoglobin 11.8 (*)    All other components within normal limits  TROPONIN I (HIGH SENSITIVITY) - Abnormal; Notable for the following components:   Troponin I (High Sensitivity) 45 (*)    All other components within normal limits  TROPONIN I (HIGH SENSITIVITY)    EKG None  Radiology DG Chest 2 View  Result Date: 04/21/2021 CLINICAL DATA:  Fall. EXAM: CHEST - 2 VIEW COMPARISON:  Chest radiograph 04/07/2020 FINDINGS: Stable cardiomediastinal contours. Aortic arch calcification. The lungs are clear. No pneumothorax or pleural effusion. No acute finding in the visualized skeleton. IMPRESSION: No acute cardiopulmonary finding. Electronically Signed   By: Audie Pinto M.D.   On: 04/21/2021 20:56   CT Head Wo Contrast  Result Date: 04/21/2021 CLINICAL DATA:  Status post fall. EXAM: CT HEAD WITHOUT CONTRAST TECHNIQUE: Contiguous axial images were obtained from the base of the skull through the  vertex without intravenous contrast. COMPARISON:  None. FINDINGS: Brain: There is mild cerebral atrophy with widening of the extra-axial spaces and ventricular dilatation. There are areas of decreased attenuation within the white matter tracts of the supratentorial brain, consistent with microvascular disease changes. Vascular: No hyperdense vessel or unexpected calcification. Skull: Normal. Negative for fracture or focal lesion. Sinuses/Orbits: There is mild sphenoid sinus mucosal thickening. Other: Small frontal soft tissue defect is seen, along the midline. IMPRESSION: Generalized cerebral atrophy, without acute intracranial pathology. Electronically Signed   By: Virgina Norfolk M.D.   On: 04/21/2021 20:55    Procedures .Marland KitchenLaceration Repair  Date/Time: 04/21/2021 11:17 PM Performed by: Charlesetta Shanks, MD Authorized by: Charlesetta Shanks, MD   Consent:    Consent obtained:  Verbal   Consent given by:  Patient Laceration details:    Location:  Face   Face location:  Forehead   Length (cm):  1.5   Depth (mm):  5 Pre-procedure details:    Preparation:  Patient was prepped and draped in usual sterile fashion Exploration:    Wound extent: areolar tissue violated     Contaminated: no   Treatment:    Area cleansed with:  Saline   Amount of cleaning:  Standard   Debridement:  None   Undermining:  None Skin repair:    Repair method:  Sutures   Suture size:  4-0   Suture material:  Nylon   Suture technique:  Simple interrupted   Number of sutures:  3 Approximation:    Approximation:  Close Repair type:    Repair type:  Simple Post-procedure details:    Dressing:  Open (no dressing)   Medications Ordered in ED Medications  lidocaine-EPINEPHrine (XYLOCAINE W/EPI) 2 %-1:200000 (PF) injection 10 mL (10 mLs Infiltration Handoff 04/21/21 2206)  acetaminophen (TYLENOL) tablet 1,000 mg (has no administration in time range)    ED Course  I have reviewed the triage  vital signs and the  nursing notes.  Pertinent labs & imaging results that were available during my care of the patient were reviewed by me and considered in my medical decision making (see chart for details).    MDM Rules/Calculators/A&P                         Patient fell in her room.  She has a laceration to the forehead and the glabella.  CT head does not show any intracranial injury.  Patient is at baseline mental status.  Mild discomfort anterior chest.  No contusions abrasions or abnormal physical findings.  Chest x-ray without acute findings.  No respiratory distress with stable vital signs.  Patient has excellent range of motion of extremities.  At this time otherwise stable.  Patient's daughter counseled on acetaminophen for pain control.  Return precautions reviewed.   Final Clinical Impression(s) / ED Diagnoses Final diagnoses:  Fall, initial encounter  Facial laceration, initial encounter  Injury of head, initial encounter    Rx / DC Orders ED Discharge Orders     None        Charlesetta Shanks, MD 04/21/21 2321

## 2021-04-30 ENCOUNTER — Ambulatory Visit: Payer: Medicare Other | Admitting: Family

## 2021-05-07 ENCOUNTER — Other Ambulatory Visit: Payer: Self-pay

## 2021-05-07 ENCOUNTER — Ambulatory Visit (INDEPENDENT_AMBULATORY_CARE_PROVIDER_SITE_OTHER): Payer: Medicare Other | Admitting: Family

## 2021-05-07 ENCOUNTER — Encounter: Payer: Self-pay | Admitting: Family

## 2021-05-07 VITALS — BP 122/64 | HR 93 | Temp 96.4°F | Ht 62.0 in | Wt 123.0 lb

## 2021-05-07 DIAGNOSIS — S0181XA Laceration without foreign body of other part of head, initial encounter: Secondary | ICD-10-CM | POA: Diagnosis not present

## 2021-05-07 NOTE — Assessment & Plan Note (Signed)
3 sutures completely removed using suture repair kit.  Betadine was applied prior to procedure.  Patient tolerated procedure well.  Wound is completely closed and healed no signs and symptoms of infection.   Daughter accompanying patient as patient with dementia, advised daughter as well to monitor for signs symptoms of infection and/or fluid to stop please follow-up for care.  Advised to keep a Band-Aid on for the next week as she can change daily so that patient does not have itching or scratching at the wound to ensure complete healing.

## 2021-05-07 NOTE — Progress Notes (Signed)
Established Patient Office Visit  Subjective:  Patient ID: Ann Riddle, female    DOB: 1922-03-25  Age: 88 y.o. MRN: [id number]  CC:  Chief Complaint  Patient presents with   Suture / Staple Removal    HPI Ann Riddle is here today for f/u from the ED and for suture removal.   04/21/21, fell and struck her head on bed resulting in small cut to forehead. In ED 1.5 cm laceration to forehead visualized right between eyes of the glabella. 4-0 nylon sutures with simple interrupted technique, 3 sutures placed.  CT head, no intracranial injury (mild cerebral atrophy). CXR with no acute findings.   Past Medical History:  Diagnosis Date   Dementia ([place])    Diaphragmatic hernia without mention of obstruction or gangrene    Diverticulosis of colon (without mention of hemorrhage)    Esophageal reflux    Irritable bowel syndrome    Other and unspecified hyperlipidemia    Paranoia ([place])    Psychosis ([place])    Unspecified essential hypertension    Ventral hernia, unspecified, without mention of obstruction or gangrene     Past Surgical History:  Procedure Laterality Date   CHOLECYSTECTOMY     CORONARY ANGIOGRAPHY N/A 06/01/2018   Procedure: CORONARY ANGIOGRAPHY;  Surgeon: Sherren Mocha, MD;  Location: [facility];  Service: Cardiovascular;  Laterality: N/A;   HIATAL HERNIA REPAIR      Family History  Problem Relation Age of Onset   Breast cancer Daughter 40   Colon cancer Neg Hx     Social History   Socioeconomic History   Marital status: Widowed    Spouse name: Not on file   Number of children: 4   Years of education: Not on file   Highest education level: High school graduate  Occupational History   Occupation: retired    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  Tobacco Use   Smoking status: Never   Smokeless tobacco: Never  Vaping Use   Vaping Use: Never used  Substance and Sexual Activity   Alcohol use: No   Drug use: No   Sexual activity: Not on file  Other  Topics Concern   Not on file  Social History Narrative   01/30/20   From: [place], [place] - but moved from [place]: Living with Daughter    Work: Retired - Armed forces logistics/support/administrative officer      04/04/20 Family: lives with daughter - Aquinitta, and has 2 other living daughters      Enjoys: not sure      Exercise: tries to walk as much as she can   Diet: good Firefighter belts: Yes    Guns: Yes  and secure   Safe in relationships: Yes    Social Determinants of Radio broadcast assistant Strain: Not on file  Food Insecurity: Not on file  Transportation Needs: Not on file  Physical Activity: Not on file  Stress: Not on file  Social Connections: Not on file  Intimate Partner Violence: Not on file    Outpatient Medications Prior to Visit  Medication Sig Dispense Refill   acetaminophen (TYLENOL) 650 MG CR tablet Take 650 mg by mouth daily as needed (headache).     citalopram (CELEXA) 10 MG tablet TAKE 1 TABLET BY MOUTH EVERY DAY 30 tablet 3   clonazePAM (KLONOPIN) 0.5 MG tablet TAKE 1 TABLET (0.5 MG TOTAL) BY MOUTH AT BEDTIME AS NEEDED FOR  ANXIETY (SLEEP). (Patient taking differently: Take 0.5 mg by mouth.) 30 tablet 3   colesevelam (WELCHOL) 625 MG tablet TAKE 1 TABLET BY MOUTH DAILY. 90 tablet 1   ENSURE (ENSURE) Take 237 mLs by mouth daily.     famotidine (PEPCID) 20 MG tablet TAKE 1 TABLET BY MOUTH TWICE A DAY 180 tablet 1   LINZESS 290 MCG CAPS capsule TAKE 1 CAPSULE BY MOUTH DAILY 30 MINUTES BEFORE BREAKFAST FOR CONSTIPATION. HOLD FOR LIQUID STOOLS 30 capsule 5   Multiple Vitamin (MULTIVITAMIN WITH MINERALS) TABS tablet Take 1 tablet by mouth daily.     nitroGLYCERIN (NITROSTAT) 0.4 MG SL tablet Place 1 tablet (0.4 mg total) under the tongue every 5 (five) minutes as needed for chest pain. 20 tablet 12   olmesartan (BENICAR) 40 MG tablet TAKE 1 TABLET BY MOUTH EVERY DAY 90 tablet 3   pantoprazole (PROTONIX) 40 MG tablet Take 1 tablet (40 mg total) by mouth 2 (two)  times daily before a meal. 180 tablet 3   polyethylene glycol (MIRALAX / GLYCOLAX) packet Take 17 g by mouth daily as needed for moderate constipation.     QUEtiapine (SEROQUEL) 50 MG tablet TAKE 1 TABLET BY MOUTH TWICE A DAY 180 tablet 1   hyoscyamine (LEVSIN SL) 0.125 MG SL tablet Place 1 tablet (0.125 mg total) under the tongue every 8 (eight) hours as needed (chest pains/acid reflux). (Patient not taking: Reported on 04/21/2021)     No facility-administered medications prior to visit.    No Known Allergies  ROS Review of Systems  Constitutional:  Negative for chills and fever.  Skin:  Positive for wound (healed, still with three remaining sutures from ER visit).  Psychiatric/Behavioral:  Positive for confusion (pt with dementia).      Objective:    Physical Exam Constitutional:      General: She is not in acute distress.    Appearance: Normal appearance. She is normal weight. She is not ill-appearing, toxic-appearing or diaphoretic.  HENT:     Head: Normocephalic.  Skin:    Findings: Laceration (mid lower forehead between eyes, sutures clean dry and intact, laceration site iwth complete closure. no erythema, no drainage, no tenderness, no warmth.) present.         BP 122/64    Pulse 93    Temp (!) 96.4 F (35.8 C) (Temporal)    Ht 5' 2" (1.575 m)    Wt 123 lb (55.8 kg)    SpO2 98%    BMI 22.50 kg/m  Wt Readings from Last 3 Encounters:  05/07/21 123 lb (55.8 kg)  02/14/21 123 lb (55.8 kg)  12/24/20 119 lb (54 kg)     Health Maintenance Due  Topic Date Due   Zoster Vaccines- Shingrix (1 of 2) Never done    There are no preventive care reminders to display for this patient.  Lab Results  Component Value Date   TSH 2.079 04/08/2020   Lab Results  Component Value Date   WBC 2.8 (L) 04/21/2021   HGB 11.8 (L) 04/21/2021   HCT 36.2 04/21/2021   MCV 92.8 04/21/2021   PLT 217 04/21/2021   Lab Results  Component Value Date   NA 138 04/21/2021   K 3.9 04/21/2021    CO2 28 04/21/2021   GLUCOSE 134 (H) 04/21/2021   BUN 18 04/21/2021   CREATININE 1.03 (H) 04/21/2021   BILITOT 0.5 10/29/2020   ALKPHOS 67 10/29/2020   AST 17 10/29/2020   ALT 9 10/29/2020  PROT 6.9 10/29/2020   ALBUMIN 4.0 10/29/2020   CALCIUM 9.0 04/21/2021   ANIONGAP 6 04/21/2021   GFR 50.40 (L) 10/29/2020   No results found for: HGBA1C    Assessment & Plan:   Problem List Items Addressed This Visit       Other   Facial laceration - Primary    3 sutures completely removed using suture repair kit.  Betadine was applied prior to procedure.  Patient tolerated procedure well.  Wound is completely closed and healed no signs and symptoms of infection.   Daughter accompanying patient as patient with dementia, advised daughter as well to monitor for signs symptoms of infection and/or fluid to stop please follow-up for care.  Advised to keep a Band-Aid on for the next week as she can change daily so that patient does not have itching or scratching at the wound to ensure complete healing.        No orders of the defined types were placed in this encounter.   Follow-up: Return if symptoms worsen or fail to improve.    Eugenia Pancoast, FNP

## 2021-05-07 NOTE — Patient Instructions (Signed)
I have removed your stitches today and the laceration site is well-healed and looking good.  Continue to monitor over the next week for signs or symptoms of infection and if any of these occur and/or wound begins to open again please return for follow-up otherwise follow-up as needed.   It was a pleasure seeing you today! Please do not hesitate to reach out with any questions and or concerns.  Regards,   Eugenia Pancoast FNP-C

## 2021-05-29 ENCOUNTER — Other Ambulatory Visit: Payer: Self-pay | Admitting: Family Medicine

## 2021-06-27 ENCOUNTER — Telehealth: Payer: Self-pay | Admitting: Family Medicine

## 2021-06-27 NOTE — Telephone Encounter (Signed)
Referral and OV notes faxed via [facility] to   [facility]/ Dr Lennice [facility] Address: [address] [place], [place] [address] Phone: [phone]   Faxed to [phone]    [email or web address]  Virl Cagey, CMA on 06/27/2021 1214 - delivered at 06/27/2021 1214   This referral popped back into my WQ from 11/2020 and its does not look like the patient ever saw Psych and they never called our office to follow up on the referral so we are just now seeing it.  Per last OV with Neurology 12/2020 they mentioned the patient has seen Psychiatry and they recommended that the patient follow up with Psychiatry for the Dementia Behavioral changes - did the patient ever see Psych, I dont see any notes scanned in.   If the patient/family no longer wants this referral please let me know.   Thanks!

## 2021-08-11 ENCOUNTER — Other Ambulatory Visit: Payer: Self-pay

## 2021-08-11 ENCOUNTER — Emergency Department (HOSPITAL_COMMUNITY)
Admission: EM | Admit: 2021-08-11 | Discharge: 2021-08-11 | Disposition: A | Discharge status: Home / Self Care | Payer: Medicare Other | Attending: Emergency Medicine | Admitting: Emergency Medicine

## 2021-08-11 ENCOUNTER — Emergency Department (HOSPITAL_COMMUNITY): Payer: Medicare Other

## 2021-08-11 DIAGNOSIS — I10 Essential (primary) hypertension: Secondary | ICD-10-CM | POA: Insufficient documentation

## 2021-08-11 DIAGNOSIS — R4189 Other symptoms and signs involving cognitive functions and awareness: Secondary | ICD-10-CM

## 2021-08-11 DIAGNOSIS — R109 Unspecified abdominal pain: Secondary | ICD-10-CM | POA: Diagnosis not present

## 2021-08-11 DIAGNOSIS — F0390 Unspecified dementia without behavioral disturbance: Secondary | ICD-10-CM | POA: Insufficient documentation

## 2021-08-11 DIAGNOSIS — R55 Syncope and collapse: Secondary | ICD-10-CM | POA: Insufficient documentation

## 2021-08-11 LAB — COMPREHENSIVE METABOLIC PANEL
ALT: 16 U/L (ref 0–44)
AST: 21 U/L (ref 15–41)
Albumin: 3.9 g/dL (ref 3.5–5.0)
Alkaline Phosphatase: 76 U/L (ref 38–126)
Anion gap: 6 (ref 5–15)
BUN: 22 mg/dL (ref 8–23)
CO2: 31 mmol/L (ref 22–32)
Calcium: 9 mg/dL (ref 8.9–10.3)
Chloride: 105 mmol/L (ref 98–111)
Creatinine, Ser: 0.89 mg/dL (ref 0.44–1.00)
GFR, Estimated: 59 mL/min — ABNORMAL LOW (ref >60)
Glucose, Bld: 111 mg/dL — ABNORMAL HIGH (ref 70–99)
Potassium: 4.1 mmol/L (ref 3.5–5.1)
Sodium: 142 mmol/L (ref 135–145)
Total Bilirubin: 0.5 mg/dL (ref 0.3–1.2)
Total Protein: 6.8 g/dL (ref 6.5–8.1)

## 2021-08-11 LAB — TROPONIN I (HIGH SENSITIVITY)
Troponin I (High Sensitivity): 38 ng/L — ABNORMAL HIGH (ref <18)
Troponin I (High Sensitivity): 39 ng/L — ABNORMAL HIGH (ref <18)

## 2021-08-11 LAB — CBC WITH DIFFERENTIAL/PLATELET
Abs Immature Granulocytes: 0 10*3/uL (ref 0.00–0.07)
Basophils Absolute: 0 10*3/uL (ref 0.0–0.1)
Basophils Relative: 1 %
Eosinophils Absolute: 0.1 10*3/uL (ref 0.0–0.5)
Eosinophils Relative: 3 %
HCT: 39.5 % (ref 36.0–46.0)
Hemoglobin: 12.7 g/dL (ref 12.0–15.0)
Immature Granulocytes: 0 %
Lymphocytes Relative: 26 %
Lymphs Abs: 0.8 10*3/uL (ref 0.7–4.0)
MCH: 29.7 pg (ref 26.0–34.0)
MCHC: 32.2 g/dL (ref 30.0–36.0)
MCV: 92.3 fL (ref 80.0–100.0)
Monocytes Absolute: 0.3 10*3/uL (ref 0.1–1.0)
Monocytes Relative: 9 %
Neutro Abs: 2 10*3/uL (ref 1.7–7.7)
Neutrophils Relative %: 61 %
Platelets: 187 10*3/uL (ref 150–400)
RBC: 4.28 MIL/uL (ref 3.87–5.11)
RDW: 13.2 % (ref 11.5–15.5)
WBC: 3.1 10*3/uL — ABNORMAL LOW (ref 4.0–10.5)
nRBC: 0 % (ref 0.0–0.2)

## 2021-08-11 LAB — URINALYSIS, ROUTINE W REFLEX MICROSCOPIC
Bilirubin Urine: NEGATIVE
Glucose, UA: NEGATIVE mg/dL
Hgb urine dipstick: NEGATIVE
Ketones, ur: NEGATIVE mg/dL
Nitrite: NEGATIVE
Protein, ur: NEGATIVE mg/dL
Specific Gravity, Urine: 1.006 (ref 1.005–1.030)
pH: 7 (ref 5.0–8.0)

## 2021-08-11 LAB — LIPASE, BLOOD: Lipase: 27 U/L (ref 11–51)

## 2021-08-11 MED ORDER — ONDANSETRON HCL 4 MG/2ML IJ SOLN
4.0000 mg | Freq: Once | INTRAMUSCULAR | Status: DC
  Filled 2021-08-11: qty 2

## 2021-08-11 NOTE — ED Provider Notes (Incomplete)
?[facility] DEPT ?Provider Note ? ? ?CSN: [id number] ?Arrival date & time: 08/11/21  1034 ? ?  ? ?History ?{Add pertinent medical, surgical, social history, OB history to HPI:1} ?Chief Complaint  ?Patient presents with  ? Loss of Consciousness  ? ? ?Ann Riddle is a 88 y.o. female. ? ?The history is provided by the nursing home and the EMS personnel.  ?Loss of Consciousness ?Episode history:  Single ?Most recent episode:  Today ?Duration:  30 seconds ?Timing:  Constant ?Progression:  Resolved ?Chronicity:  New ? ?  ? ?Home Medications ?Prior to Admission medications   ?Medication Sig Start Date End Date Taking? Authorizing Provider  ?acetaminophen (TYLENOL) 650 MG CR tablet Take 650 mg by mouth daily as needed (headache).    [provider]  ?citalopram (CELEXA) 10 MG tablet TAKE 1 TABLET BY MOUTH EVERY DAY 03/15/21   Lesleigh Noe, MD  ?clonazePAM (KLONOPIN) 0.5 MG tablet TAKE 1 TABLET (0.5 MG TOTAL) BY MOUTH AT BEDTIME AS NEEDED FOR ANXIETY (SLEEP). ?Patient taking differently: Take 0.5 mg by mouth. 02/11/21   Lesleigh Noe, MD  ?colesevelam (WELCHOL) 625 MG tablet TAKE 1 TABLET BY MOUTH DAILY. 03/18/21   Milus Banister, MD  ?ENSURE (ENSURE) Take 237 mLs by mouth daily.    [provider]  ?famotidine (PEPCID) 20 MG tablet TAKE 1 TABLET BY MOUTH TWICE A DAY 12/06/20   Lesleigh Noe, MD  ?LINZESS 290 MCG CAPS capsule TAKE 1 CAPSULE BY MOUTH DAILY 30 MINUTES BEFORE BREAKFAST FOR CONSTIPATION. HOLD FOR LIQUID STOOLS 01/23/21   Lesleigh Noe, MD  ?Multiple Vitamin (MULTIVITAMIN WITH MINERALS) TABS tablet Take 1 tablet by mouth daily.    [provider]  ?nitroGLYCERIN (NITROSTAT) 0.4 MG SL tablet Place 1 tablet (0.4 mg total) under the tongue every 5 (five) minutes as needed for chest pain. 11/14/15   Thurnell Lose, MD  ?olmesartan (BENICAR) 40 MG tablet TAKE 1 TABLET BY MOUTH EVERY DAY 04/23/21   Lesleigh Noe, MD  ?pantoprazole (PROTONIX) 40 MG  tablet Take 1 tablet (40 mg total) by mouth 2 (two) times daily before a meal. 04/02/20   Milus Banister, MD  ?polyethylene glycol (MIRALAX / GLYCOLAX) packet Take 17 g by mouth daily as needed for moderate constipation.    [provider]  ?QUEtiapine (SEROQUEL) 50 MG tablet TAKE 1 TABLET BY MOUTH TWICE A DAY 03/27/21   Lesleigh Noe, MD  ?   ? ?Allergies    ?Patient has no known allergies.   ? ?Review of Systems   ?Review of Systems  ?Cardiovascular:  Positive for syncope.  ? ?Physical Exam ?Updated Vital Signs ?BP (!) 167/75   Pulse 75   Temp 97.6 ?F (36.4 ?C) (Oral)   Resp (!) 22   SpO2 100%  ?Physical Exam ? ?ED Results / Procedures / Treatments   ?Labs ?(all labs ordered are listed, but only abnormal results are displayed) ?Labs Reviewed  ?CBC WITH DIFFERENTIAL/PLATELET - Abnormal; Notable for the following components:  ?    Result Value  ? WBC 3.1 (*)   ? All other components within normal limits  ?COMPREHENSIVE METABOLIC PANEL - Abnormal; Notable for the following components:  ? Glucose, Bld 111 (*)   ? GFR, Estimated 59 (*)   ? All other components within normal limits  ?TROPONIN I (HIGH SENSITIVITY) - Abnormal; Notable for the following components:  ? Troponin I (High Sensitivity) 38 (*)   ? All  other components within normal limits  ?LIPASE, BLOOD  ?URINALYSIS, ROUTINE W REFLEX MICROSCOPIC  ?TROPONIN I (HIGH SENSITIVITY)  ? ? ?EKG ?EKG Interpretation ? ?Date/Time:  Sunday August 11 2021 11:24:27 EDT ?Ventricular Rate:  81 ?PR Interval:  152 ?QRS Duration: 72 ?QT Interval:  400 ?QTC Calculation: 465 ?R Axis:   41 ?Text Interpretation: Sinus rhythm Probable left atrial enlargement Borderline T abnormalities, lateral leads No significant change since last tracing Confirmed by Blanchie Dessert [phone]) on 08/11/2021 12:04:45 PM ? ?Radiology ?DG Chest Port 1 View ? ?Result Date: 08/11/2021 ?CLINICAL DATA:  Syncope. EXAM: PORTABLE CHEST 1 VIEW COMPARISON:  04/21/2021 FINDINGS: 1109 hours. The cardio  pericardial silhouette is enlarged. The lungs are clear without focal pneumonia, edema, pneumothorax or pleural effusion. Bones are diffusely demineralized. Telemetry leads overlie the chest. IMPRESSION: No active disease. Electronically Signed   By: Misty Stanley M.D.   On: 08/11/2021 11:48   ? ?Procedures ?Procedures  ?{Document cardiac monitor, telemetry assessment procedure when appropriate:1} ? ?Medications Ordered in ED ?Medications  ?ondansetron (ZOFRAN) injection 4 mg (0 mg Intravenous Patient Refused/Not Given 08/11/21 1151)  ? ? ?ED Course/ Medical Decision Making/ A&P ?  ?                        ?Medical Decision Making ?Amount and/or Complexity of Data Reviewed ?Labs: ordered. ?Radiology: ordered. ? ?Risk ?Prescription drug management. ? ? ?*** ? ?{Document critical care time when appropriate:1} ?{Document review of labs and clinical decision tools ie heart score, Chads2Vasc2 etc:1}  ?{Document your independent review of radiology images, and any outside records:1} ?{Document your discussion with family members, caretakers, and with consultants:1} ?{Document social determinants of health affecting pt's care:1} ?{Document your decision making why or why not admission, treatments were needed:1} ?Final Clinical Impression(s) / ED Diagnoses ?Final diagnoses:  ?Unresponsive episode  ? ? ?Rx / DC Orders ?ED Discharge Orders   ? ? None  ? ?  ? ? ?

## 2021-08-11 NOTE — ED Provider Notes (Signed)
Patient received in signout from Dr Maryan Rued.  Briefly this a 88 year old female who presented from her nursing facility with concern for an unresponsive spell this morning lasting approximately 30 seconds, where she was "difficult to wake up".  Subsequently she woke up and ate breakfast fine has been at baseline since then.  It is not clear if the patient ever truly had syncope, or if she which was difficult to arouse while sleeping.  Patient has no acute complaints.  She has some dementia.  No chest pain or shortness of breath.  Work-up has been largely unremarkable, delta troponin is low and at baseline, no acute findings of infection, anemia, or significant dehydration.  No localizing symptoms of stroke.  Coronary CT in Jan 2020 with widely patent coronary arteries - unlikely ACS as a cause of symptoms.  Dr Maryan Rued discussed the patient's care with her daughter per report, and plan for discharge back to her facility. ?  ?Ann Dusky, MD ?08/11/21 1600 ? ?

## 2021-08-11 NOTE — Discharge Instructions (Signed)
All the blood test today look normal.  No evidence of any issues with the heart or signs of electrolyte abnormality.  Patient can continue her current medications and needs to follow-up with her doctor for any further issues. ?

## 2021-08-11 NOTE — ED Notes (Signed)
Discharge instructions sent with transport for facility as well as copies of results. It took approx 30 minutes to assist patient to bathroom and persuade her onto stretcher to be transported. Pt is anxious and afraid somebody is going to hurt her at baseline per facility. Pt was finally able to sit on stretcher and transport exited ED. ?

## 2021-08-11 NOTE — ED Provider Notes (Signed)
?[facility] DEPT ?Provider Note ? ? ?CSN: [id number] ?Arrival date & time: 08/11/21  1034 ? ?  ? ?History ? ?Chief Complaint  ?Patient presents with  ? Loss of Consciousness  ? ? ?Ann Riddle is a 88 y.o. female. ? ?Is a 88 year old female with a history of chronic abdominal pain, hypertension, acid reflux, dementia and paranoia who is being brought in today from [facility] because per her daughter patient was sleeping this morning and they had difficult time waking her up.  EMS reported she was eating breakfast and was hard to arouse for approximately 30 seconds.  However her daughter reports she woke up and then started eating breakfast.  Upon arrival here patient denies having any chest pain, shortness of breath.  She does complain of abdominal discomfort which sounds to be more of her baseline per her daughter.  She has had no recent cough, congestion or fevers.  There has been no reported medication changes. ? ?The history is provided by the EMS personnel and the nursing home.  ?Loss of Consciousness ? ?  ? ?Home Medications ?Prior to Admission medications   ?Medication Sig Start Date End Date Taking? Authorizing Provider  ?acetaminophen (TYLENOL) 650 MG CR tablet Take 650 mg by mouth daily as needed (headache).    [provider]  ?citalopram (CELEXA) 10 MG tablet TAKE 1 TABLET BY MOUTH EVERY DAY 03/15/21   Lesleigh Noe, MD  ?clonazePAM (KLONOPIN) 0.5 MG tablet TAKE 1 TABLET (0.5 MG TOTAL) BY MOUTH AT BEDTIME AS NEEDED FOR ANXIETY (SLEEP). ?Patient taking differently: Take 0.5 mg by mouth. 02/11/21   Lesleigh Noe, MD  ?colesevelam (WELCHOL) 625 MG tablet TAKE 1 TABLET BY MOUTH DAILY. 03/18/21   Milus Banister, MD  ?ENSURE (ENSURE) Take 237 mLs by mouth daily.    [provider]  ?famotidine (PEPCID) 20 MG tablet TAKE 1 TABLET BY MOUTH TWICE A DAY 12/06/20   Lesleigh Noe, MD  ?LINZESS 290 MCG CAPS capsule TAKE 1 CAPSULE BY MOUTH DAILY 30  MINUTES BEFORE BREAKFAST FOR CONSTIPATION. HOLD FOR LIQUID STOOLS 01/23/21   Lesleigh Noe, MD  ?Multiple Vitamin (MULTIVITAMIN WITH MINERALS) TABS tablet Take 1 tablet by mouth daily.    [provider]  ?nitroGLYCERIN (NITROSTAT) 0.4 MG SL tablet Place 1 tablet (0.4 mg total) under the tongue every 5 (five) minutes as needed for chest pain. 11/14/15   Thurnell Lose, MD  ?olmesartan (BENICAR) 40 MG tablet TAKE 1 TABLET BY MOUTH EVERY DAY 04/23/21   Lesleigh Noe, MD  ?pantoprazole (PROTONIX) 40 MG tablet Take 1 tablet (40 mg total) by mouth 2 (two) times daily before a meal. 04/02/20   Milus Banister, MD  ?polyethylene glycol (MIRALAX / GLYCOLAX) packet Take 17 g by mouth daily as needed for moderate constipation.    [provider]  ?QUEtiapine (SEROQUEL) 50 MG tablet TAKE 1 TABLET BY MOUTH TWICE A DAY 03/27/21   Lesleigh Noe, MD  ?   ? ?Allergies    ?Patient has no known allergies.   ? ?Review of Systems   ?Review of Systems  ?Cardiovascular:  Positive for syncope.  ? ?Physical Exam ?Updated Vital Signs ?BP (!) 168/77 (BP Location: Right Arm)   Pulse 79   Temp 97.6 ?F (36.4 ?C) (Oral)   Resp 14   SpO2 100%  ?Physical Exam ?Vitals and nursing note reviewed.  ?Constitutional:   ?   General: She is not  in acute distress. ?   Appearance: She is well-developed.  ?HENT:  ?   Head: Normocephalic and atraumatic.  ?Eyes:  ?   Pupils: Pupils are equal, round, and reactive to light.  ?Cardiovascular:  ?   Rate and Rhythm: Normal rate and regular rhythm.  ?   Heart sounds: Normal heart sounds. No murmur heard. ?  No friction rub.  ?Pulmonary:  ?   Effort: Pulmonary effort is normal.  ?   Breath sounds: Normal breath sounds. No wheezing or rales.  ?Abdominal:  ?   General: Bowel sounds are normal. There is no distension.  ?   Palpations: Abdomen is soft.  ?   Tenderness: There is no abdominal tenderness. There is no guarding or rebound.  ?Musculoskeletal:     ?   General: No tenderness. Normal  range of motion.  ?   Cervical back: Normal range of motion and neck supple.  ?   Right lower leg: No edema.  ?   Left lower leg: No edema.  ?   Comments: No edema  ?Skin: ?   General: Skin is warm and dry.  ?   Findings: No rash.  ?Neurological:  ?   Mental Status: She is alert.  ?   Cranial Nerves: No cranial nerve deficit.  ?   Comments: Oriented to self  ?Psychiatric:  ?   Comments: Calm and cooperative  ? ? ?ED Results / Procedures / Treatments   ?Labs ?(all labs ordered are listed, but only abnormal results are displayed) ?Labs Reviewed  ?CBC WITH DIFFERENTIAL/PLATELET - Abnormal; Notable for the following components:  ?    Result Value  ? WBC 3.1 (*)   ? All other components within normal limits  ?COMPREHENSIVE METABOLIC PANEL - Abnormal; Notable for the following components:  ? Glucose, Bld 111 (*)   ? GFR, Estimated 59 (*)   ? All other components within normal limits  ?TROPONIN I (HIGH SENSITIVITY) - Abnormal; Notable for the following components:  ? Troponin I (High Sensitivity) 38 (*)   ? All other components within normal limits  ?LIPASE, BLOOD  ?URINALYSIS, ROUTINE W REFLEX MICROSCOPIC  ?TROPONIN I (HIGH SENSITIVITY)  ? ? ?EKG ?EKG Interpretation ? ?Date/Time:  Sunday August 11 2021 11:24:27 EDT ?Ventricular Rate:  81 ?PR Interval:  152 ?QRS Duration: 72 ?QT Interval:  400 ?QTC Calculation: 465 ?R Axis:   41 ?Text Interpretation: Sinus rhythm Probable left atrial enlargement Borderline T abnormalities, lateral leads No significant change since last tracing Confirmed by Blanchie Dessert [phone]) on 08/11/2021 12:04:45 PM ? ?Radiology ?DG Chest Port 1 View ? ?Result Date: 08/11/2021 ?CLINICAL DATA:  Syncope. EXAM: PORTABLE CHEST 1 VIEW COMPARISON:  04/21/2021 FINDINGS: 1109 hours. The cardio pericardial silhouette is enlarged. The lungs are clear without focal pneumonia, edema, pneumothorax or pleural effusion. Bones are diffusely demineralized. Telemetry leads overlie the chest. IMPRESSION: No active  disease. Electronically Signed   By: Misty Stanley M.D.   On: 08/11/2021 11:48   ? ?Procedures ?Procedures  ? ? ?Medications Ordered in ED ?Medications  ?ondansetron (ZOFRAN) injection 4 mg (0 mg Intravenous Patient Refused/Not Given 08/11/21 1151)  ? ? ?ED Course/ Medical Decision Making/ A&P ?  ?                        ?Medical Decision Making ?Amount and/or Complexity of Data Reviewed ?Independent Historian: caregiver and EMS ?External Data Reviewed: notes. ?Labs: ordered. Decision-making details documented in ED Course. ?  Radiology: ordered and independent interpretation performed. Decision-making details documented in ED Course. ?ECG/medicine tests: ordered and independent interpretation performed. Decision-making details documented in ED Course. ? ?Risk ?Prescription drug management. ? ? ?Patient is an elderly female being brought in today with concern for possible syncopal event versus just difficulty arousing this morning.  Patient is awake alert and in no acute distress here.  She is able to answer questions.  She does not recall any abnormalities this morning and denies feeling lightheaded or unusual except for some abdominal discomfort which this appears to be chronic.  Patient has been on the monitor throughout her stay with no evidence of dysrhythmia.  I independently interpreted patient's labs and EKG.  Her EKG today shows no acute changes from prior, troponin is 38 which is stable for her, CBC with leukopenia with a white count of 3.1 which is a chronic finding for her, normal platelet count, lipase within normal limits and CMP without acute findings.  I independently visualized and interpreted patient's chest x-ray which shows no acute findings today.  On repeat evaluation patient did receive Zofran for possible nausea and abdominal discomfort.  She has a soft nontender abdomen at this time.  She has had no vomiting.  Vital signs remained stable.  Spoke with her daughter and if her second troponin is  negative feel that she is stable for discharge.  Low suspicion at this time for dysrhythmia, ACS or acute infectious cause.  Patient did have a catheterization in 2020 which showed right dominant coronary artery wi

## 2021-08-11 NOTE — ED Notes (Signed)
X-ray at bedside

## 2021-08-11 NOTE — ED Triage Notes (Signed)
Ems brings pt in from [facility] for syncope. Staff states pt had a 30 second syncopal episode while eating breakfast today. States pt has been at baseline since. Family wanted pt evaluated. Denies fall, denies hitting head.  ?

## 2021-08-11 NOTE — ED Notes (Signed)
Ptar called 

## 2021-08-11 NOTE — ED Notes (Signed)
Called ptar for eta for transport. Ptar states the wait time is unknown. ?

## 2021-12-30 ENCOUNTER — Telehealth: Payer: Self-pay | Admitting: Family Medicine

## 2021-12-30 NOTE — Telephone Encounter (Signed)
LVM for pt to rtn my call to schedule AWV with [facility] call back # [phone] 

## 2022-08-03 ENCOUNTER — Emergency Department (HOSPITAL_COMMUNITY): Payer: Medicare Other

## 2022-08-03 ENCOUNTER — Emergency Department (HOSPITAL_COMMUNITY)
Admission: EM | Admit: 2022-08-03 | Discharge: 2022-08-03 | Disposition: A | Discharge status: Skilled Nursing Facility | Payer: Medicare Other | Attending: Emergency Medicine | Admitting: Emergency Medicine

## 2022-08-03 ENCOUNTER — Encounter (HOSPITAL_COMMUNITY): Payer: Self-pay

## 2022-08-03 DIAGNOSIS — F0390 Unspecified dementia without behavioral disturbance: Secondary | ICD-10-CM | POA: Diagnosis not present

## 2022-08-03 DIAGNOSIS — Z9049 Acquired absence of other specified parts of digestive tract: Secondary | ICD-10-CM | POA: Diagnosis not present

## 2022-08-03 DIAGNOSIS — H1131 Conjunctival hemorrhage, right eye: Secondary | ICD-10-CM | POA: Diagnosis not present

## 2022-08-03 DIAGNOSIS — K439 Ventral hernia without obstruction or gangrene: Secondary | ICD-10-CM | POA: Insufficient documentation

## 2022-08-03 DIAGNOSIS — S00531A Contusion of lip, initial encounter: Secondary | ICD-10-CM | POA: Diagnosis not present

## 2022-08-03 DIAGNOSIS — I517 Cardiomegaly: Secondary | ICD-10-CM | POA: Diagnosis not present

## 2022-08-03 DIAGNOSIS — W19XXXA Unspecified fall, initial encounter: Secondary | ICD-10-CM | POA: Insufficient documentation

## 2022-08-03 DIAGNOSIS — I10 Essential (primary) hypertension: Secondary | ICD-10-CM | POA: Diagnosis not present

## 2022-08-03 DIAGNOSIS — I700 Atherosclerosis of aorta: Secondary | ICD-10-CM | POA: Insufficient documentation

## 2022-08-03 DIAGNOSIS — I2510 Atherosclerotic heart disease of native coronary artery without angina pectoris: Secondary | ICD-10-CM | POA: Insufficient documentation

## 2022-08-03 DIAGNOSIS — F03911 Unspecified dementia, unspecified severity, with agitation: Secondary | ICD-10-CM

## 2022-08-03 DIAGNOSIS — R220 Localized swelling, mass and lump, head: Secondary | ICD-10-CM | POA: Diagnosis present

## 2022-08-03 LAB — COMPREHENSIVE METABOLIC PANEL
ALT: 17 U/L (ref 0–44)
AST: 21 U/L (ref 15–41)
Albumin: 3.2 g/dL — ABNORMAL LOW (ref 3.5–5.0)
Alkaline Phosphatase: 82 U/L (ref 38–126)
Anion gap: 11 (ref 5–15)
BUN: 21 mg/dL (ref 8–23)
CO2: 28 mmol/L (ref 22–32)
Calcium: 8.7 mg/dL — ABNORMAL LOW (ref 8.9–10.3)
Chloride: 101 mmol/L (ref 98–111)
Creatinine, Ser: 1.02 mg/dL — ABNORMAL HIGH (ref 0.44–1.00)
GFR, Estimated: 50 mL/min — ABNORMAL LOW (ref >60)
Glucose, Bld: 93 mg/dL (ref 70–99)
Potassium: 3.7 mmol/L (ref 3.5–5.1)
Sodium: 140 mmol/L (ref 135–145)
Total Bilirubin: 0.5 mg/dL (ref 0.3–1.2)
Total Protein: 5.9 g/dL — ABNORMAL LOW (ref 6.5–8.1)

## 2022-08-03 LAB — URINALYSIS, ROUTINE W REFLEX MICROSCOPIC
Bilirubin Urine: NEGATIVE
Glucose, UA: NEGATIVE mg/dL
Ketones, ur: NEGATIVE mg/dL
Leukocytes,Ua: NEGATIVE
Nitrite: NEGATIVE
Protein, ur: NEGATIVE mg/dL
Specific Gravity, Urine: 1.005 (ref 1.005–1.030)
pH: 7 (ref 5.0–8.0)

## 2022-08-03 LAB — CBC
HCT: 34.2 % — ABNORMAL LOW (ref 36.0–46.0)
Hemoglobin: 10.7 g/dL — ABNORMAL LOW (ref 12.0–15.0)
MCH: 29.4 pg (ref 26.0–34.0)
MCHC: 31.3 g/dL (ref 30.0–36.0)
MCV: 94 fL (ref 80.0–100.0)
Platelets: 199 10*3/uL (ref 150–400)
RBC: 3.64 MIL/uL — ABNORMAL LOW (ref 3.87–5.11)
RDW: 14.4 % (ref 11.5–15.5)
WBC: 4.2 10*3/uL (ref 4.0–10.5)
nRBC: 0 % (ref 0.0–0.2)

## 2022-08-03 LAB — MAGNESIUM: Magnesium: 2.4 mg/dL (ref 1.7–2.4)

## 2022-08-03 LAB — CK: Total CK: 80 U/L (ref 38–234)

## 2022-08-03 MED ORDER — QUETIAPINE FUMARATE 25 MG PO TABS: 50.0000 mg | ORAL_TABLET | Freq: Once | ORAL | Status: DC

## 2022-08-03 MED ORDER — ZIPRASIDONE MESYLATE 20 MG IM SOLR
20.0000 mg | Freq: Once | INTRAMUSCULAR | Status: AC
  Administered 2022-08-03: 20 mg via INTRAMUSCULAR
  Filled 2022-08-03: qty 20

## 2022-08-03 MED ORDER — SODIUM CHLORIDE 0.9 % IV BOLUS
500.0000 mL | Freq: Once | INTRAVENOUS | Status: AC
  Administered 2022-08-03: 500 mL via INTRAVENOUS

## 2022-08-03 MED ORDER — IOHEXOL 350 MG/ML SOLN
75.0000 mL | Freq: Once | INTRAVENOUS | Status: AC | PRN
  Administered 2022-08-03: 75 mL via INTRAVENOUS

## 2022-08-03 MED ORDER — ACETAMINOPHEN 325 MG PO TABS
650.0000 mg | ORAL_TABLET | Freq: Once | ORAL | Status: DC
  Filled 2022-08-03: qty 2

## 2022-08-03 MED ORDER — HALOPERIDOL LACTATE 5 MG/ML IJ SOLN
2.0000 mg | Freq: Once | INTRAMUSCULAR | Status: AC
  Administered 2022-08-03: 2 mg via INTRAVENOUS
  Filled 2022-08-03: qty 1

## 2022-08-03 NOTE — ED Notes (Signed)
Pt received for care at 1905.  Resting in bed; respirations even and unlabored.  Pt has mitts on due to pulling at equipment and trying to get out of bed per prior RN.  Pt fidgeting with mitts at this time.  Bed in lowest position, wheels locked.  Per report, pt awaiting PTAR for transport.

## 2022-08-03 NOTE — ED Triage Notes (Addendum)
Pt arrives by EMS coming from [facility]. Staff reported that pt had 2 unwitnessed falls yesterday and pt daughter requested pt come to ER for eval. Staff reports pt had been alert and waking around the facility today. Pt alert and confused in triage (baseline per EMS). Pt initially placed in restraints by EMS for combativeness but pt calm on ED arrival.  Bruising and swelling noted above lip and redness to right eye.  No blood thinner use.

## 2022-08-03 NOTE — ED Notes (Signed)
Patient continues to try to get OOB without assistance, combative with staff. Patient removed IV. Place placed back in bed. Will continue to monitor

## 2022-08-03 NOTE — ED Notes (Signed)
This EMT observed this patient trying to get out of the bed. The patient told this EMT that she had to get up in the bathroom and was the reason why she was trying to get out of the bed. This EMT observed the patient had previously had a bowel movement, and subsequently cleaned the patient before assisting the patient to use the bedpan. This EMT secured the patient's environment and kept the curtain/door open for observation of the patient.

## 2022-08-03 NOTE — ED Notes (Addendum)
Pt constantly calling for help, stating "I can't get up, I can't get out of bed."  Pt reminded that she cannot get up at this time due to fall risk.  Pt noted to be wet.  Pt cleaned and changed; new brief and gown placed on pt.  While cleaning pt, pt regularly asking if we are taking her home.  This RN reminded pt that a transport team would take her home when able.

## 2022-08-03 NOTE — ED Notes (Signed)
Assumed care of dementia pt who was seen for fall and is pending discharge back to care facility [facility] via [place]. Pt confused per baseline respirations even and non labored  asking when she gets to go home. Will monitor as directed

## 2022-08-03 NOTE — ED Provider Notes (Signed)
  Provider Note MRN:  [id number]  Arrival date & time: 08/03/22    ED Course and Medical Decision Making  Assumed care from [facility] at shift change.  See note from prior team for complete details, in brief:  89 year old female from nursing facility here secondary to fall yesterday. Unwitnessed fall, fell onto her face.  Unclear events leading up to the fall. Patient complaining of face pain, abdominal pain. No nausea or vomiting, Agitated Level 5 caveat dementia  Clinical Course as of 08/03/22 2037  Nancy Fetter Aug 03, 2022  1532 89 yo f, unwitnessed fall yesterday, poor historian. Received haldol. Vague abd pain, pending CTAP for dc.  [SG]  1756 CTAP reviewed, stable [SG]    Clinical Course User Index [SG] Jeanell Sparrow, DO      Plan per prior physician f/u ct  Labs ordered by prior team reviewed and are stable CT of head and face and C-spine were stable, x-ray of chest and pelvis stable.  Pending CT of her abdomen given vague abdominal pain Patient required Geodon secondary to agitation to facilitate imaging  CTAP was stable  Stable for dc back to facility, prior team d/w NP at snf   The patient improved significantly and was discharged in stable condition. Detailed discussions were had with the patient regarding current findings, and need for close f/u with PCP or on call doctor. The patient has been instructed to return immediately if the symptoms worsen in any way for re-evaluation. Patient verbalized understanding and is in agreement with current care plan. All questions answered prior to discharge.    Procedures  Final Clinical Impressions(s) / ED Diagnoses     ICD-10-CM   1. Fall, initial encounter  W19.XXXA     2. Dementia with agitation, unspecified dementia severity, unspecified dementia type ([place])  [id number]       ED Discharge Orders     None         Discharge Instructions      It was a pleasure caring for you today in the emergency department.  Please  return to the emergency department for any worsening or worrisome symptoms.          Jeanell Sparrow, DO 08/03/22 2037

## 2022-08-03 NOTE — ED Notes (Signed)
Patient continues to try to get OOB without assistance, combative with staff. Place placed back in bed. Will continue to monitor

## 2022-08-03 NOTE — ED Notes (Signed)
Pt still waiting for PTAR is becoming agitated attempting to get out of bed. Bed locked in low position fall mat  alarm under pt request sent to MD for pt night dose of 50mg  seroquel

## 2022-08-03 NOTE — Discharge Instructions (Signed)
It was a pleasure caring for you today in the emergency department. ° °Please return to the emergency department for any worsening or worrisome symptoms. ° ° °

## 2022-08-03 NOTE — ED Notes (Signed)
Pt covered with warm blankets.  Pt being moved to ED 40 to await PTAR for transport.
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
## 2022-10-30 ENCOUNTER — Ambulatory Visit (INDEPENDENT_AMBULATORY_CARE_PROVIDER_SITE_OTHER): Payer: Medicare Other | Admitting: Sports Medicine

## 2022-10-30 ENCOUNTER — Other Ambulatory Visit (INDEPENDENT_AMBULATORY_CARE_PROVIDER_SITE_OTHER): Payer: Medicare Other

## 2022-10-30 ENCOUNTER — Encounter: Payer: Self-pay | Admitting: Sports Medicine

## 2022-10-30 DIAGNOSIS — M25561 Pain in right knee: Secondary | ICD-10-CM | POA: Diagnosis not present

## 2022-10-30 DIAGNOSIS — G8929 Other chronic pain: Secondary | ICD-10-CM

## 2022-10-30 DIAGNOSIS — M1711 Unilateral primary osteoarthritis, right knee: Secondary | ICD-10-CM | POA: Diagnosis not present

## 2022-10-30 NOTE — Progress Notes (Signed)
[name] - 89 y.o. female MRN [id number]  Date of birth: Nov 11, 1921  Office Visit Note: Visit Date: 10/30/2022 PCP: Cheyenne Adas Referred by: Cheyenne Adas  Subjective: Chief Complaint  Patient presents with   Right Knee - Pain   HPI: Ann Riddle is a pleasant 89 y.o. female who presents today for acute on chronic right knee pain.  She is here today with a nursing aide from [facility] who helps provide majority of HPI.  States she has complained intermittently of knee pain over the last month or so.  [place] ambulates freely, although at times has used a wheelchair.  She is able to walk today.  Currently reports no knee pain.  Notes reviewed from [facility]. Past medical history of dementia with mood disturbance, paranoid personality disorder, depression, GERD, IBS, delusional disorder, essential hypertension, dizziness and giddiness.  Pertinent ROS were reviewed with the patient and found to be negative unless otherwise specified above in HPI.   Assessment & Plan: Visit Diagnoses:  1. Unilateral primary osteoarthritis, right knee   2. Chronic pain of right knee    Plan: Discussed with Corrie Dandy and her caregiver today that she does have moderate osteoarthritis of the right knee, this is likely the cause of her pain.  Discussed all treatment options such as oral medication, topical medication and rehab therapy.  She denies having any pain today.  We will start her on Voltaren 1% gel to be applied 3 times a day.  May also use Tylenol, ice or heat as needed.  She will continue walking as tolerated.  Discussed could always consider an injection of corticosteroid into the knee, but will hold on this for now.  Would likely be difficult given her advanced dementia and memory issues. F/u as needed.  Follow-up: Return if symptoms worsen or fail to improve.   Meds & Orders: No orders of the defined types were placed in this encounter.   Orders Placed This Encounter   Procedures   XR KNEE 3 VIEW RIGHT     Procedures: No procedures performed      Clinical History: No specialty comments available.  She reports that she has never smoked. She has never used smokeless tobacco. No results for input(s): "HGBA1C", "LABURIC" in the last 8760 hours.  Objective:   Vital Signs: There were no vitals taken for this visit.  Physical Exam  Gen: Well-appearing, in no acute distress; non-toxic CV:  Well-perfused. Warm.  Resp: Breathing unlabored on room air; no wheezing. Psych: Fluid speech in conversation; appropriate affect; normal thought process Neuro: Sensation intact throughout. No gross coordination deficits.   Ortho Exam - Right knee: No joint line TTP.  Inspection does demonstrate a mild effusion of the right knee, no effusion of the left knee.  There is no warmth or redness.  Range of motion from 0-125 degrees.  Is able to perform active knee flexion and extension.  Does ambulate without assistance.  Imaging: XR KNEE 3 VIEW RIGHT  Result Date: 10/30/2022 3 views of the right knee including AP, lateral and sunrise view were ordered and reviewed by myself.  X-rays demonstrate moderate tricompartmental osteoarthritis most significant over the medial tibiofemoral joint.  There is some calcification over the medial femoral condyle.  No acute fracture or other bony abnormality noted.   Past Medical/Family/Surgical/Social History: Medications & Allergies reviewed per EMR, new medications updated. Patient Active Problem List   Diagnosis Date Noted   Facial laceration 05/07/2021  Chronic thoracic back pain 02/14/2021   Left arm pain 07/03/2020   Burning pain 05/29/2020   Bilateral leg numbness 05/29/2020   Dizziness 04/23/2020   Hypoglycemia 04/07/2020   Dementia with behavioral disturbance (HCC) 04/02/2020   Hallucinations    Atherosclerosis of aorta (HCC) 02/29/2020   Vaginal mass 02/29/2020   Osteopenia 02/22/2020   Insomnia 01/30/2020    Current moderate episode of major depressive disorder without prior episode (HCC) 01/30/2020   Generalized anxiety disorder 01/30/2020   Porokeratosis 10/26/2019   Abnormal nuclear cardiac imaging test    Chest pain 11/13/2015   Constipation 11/10/2007   Hyperlipidemia 08/04/2007   Essential hypertension 08/04/2007   GERD 08/04/2007   IBS 08/04/2007   Past Medical History:  Diagnosis Date   Dementia (HCC)    Diaphragmatic hernia without mention of obstruction or gangrene    Diverticulosis of colon (without mention of hemorrhage)    Esophageal reflux    Irritable bowel syndrome    Other and unspecified hyperlipidemia    Paranoia (HCC)    Psychosis (HCC)    Unspecified essential hypertension    Ventral hernia, unspecified, without mention of obstruction or gangrene    Family History  Problem Relation Age of Onset   Breast cancer Daughter 80   Colon cancer Neg Hx    Past Surgical History:  Procedure Laterality Date   CHOLECYSTECTOMY     CORONARY ANGIOGRAPHY N/A 06/01/2018   Procedure: CORONARY ANGIOGRAPHY;  Surgeon: Tonny Bollman, MD;  Location: [facility] INVASIVE CV LAB;  Service: Cardiovascular;  Laterality: N/A;   HIATAL HERNIA REPAIR     Social History   Occupational History   Occupation: retired    Comment: Geophysical data processor  Tobacco Use   Smoking status: Never   Smokeless tobacco: Never  Vaping Use   Vaping Use: Never used  Substance and Sexual Activity   Alcohol use: No   Drug use: No   Sexual activity: Not on file

## 2024-01-17 ENCOUNTER — Other Ambulatory Visit: Payer: Self-pay

## 2024-01-17 ENCOUNTER — Emergency Department (HOSPITAL_COMMUNITY)
Admission: EM | Admit: 2024-01-17 | Discharge: 2024-01-17 | Disposition: A | Discharge status: Home / Self Care | Attending: Emergency Medicine | Admitting: Emergency Medicine

## 2024-01-17 ENCOUNTER — Emergency Department (HOSPITAL_COMMUNITY)

## 2024-01-17 ENCOUNTER — Encounter (HOSPITAL_COMMUNITY): Payer: Self-pay

## 2024-01-17 DIAGNOSIS — S0081XA Abrasion of other part of head, initial encounter: Secondary | ICD-10-CM | POA: Diagnosis present

## 2024-01-17 DIAGNOSIS — S0003XA Contusion of scalp, initial encounter: Secondary | ICD-10-CM

## 2024-01-17 DIAGNOSIS — F0390 Unspecified dementia without behavioral disturbance: Secondary | ICD-10-CM | POA: Insufficient documentation
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
## 2024-01-17 NOTE — ED Notes (Signed)
 Patients family transporting her POV to SNF.

## 2024-01-17 NOTE — ED Notes (Signed)
 EDP back into room, speaking with pt and family.

## 2024-01-17 NOTE — ED Notes (Signed)
 EDP at [facility]. C-collar removed.

## 2024-01-17 NOTE — ED Notes (Signed)
 Pt alert, NAD, calm, interactive, bandage to head is CDI, bleeding controlled. EDP at [facility].

## 2024-01-17 NOTE — ED Notes (Signed)
 [facility] speaking with pt and family at [facility]. Pending CTs. No changes.
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
## 2024-01-17 NOTE — ED Notes (Addendum)
 [facility] called, report called to [facility] [place] answered/ notified.
